# Patient Record
Sex: Female | Born: 1950 | Race: White | Hispanic: No | Smoking: Former smoker
Health system: Southern US, Community
[De-identification: ages and names within clinical notes are randomized; demographics above are authoritative.]

## PROBLEM LIST (undated history)

## (undated) DIAGNOSIS — D649 Anemia, unspecified: Secondary | ICD-10-CM

## (undated) DIAGNOSIS — R202 Paresthesia of skin: Secondary | ICD-10-CM

## (undated) DIAGNOSIS — R918 Other nonspecific abnormal finding of lung field: Secondary | ICD-10-CM

## (undated) DIAGNOSIS — N2889 Other specified disorders of kidney and ureter: Secondary | ICD-10-CM

## (undated) DIAGNOSIS — C8590 Non-Hodgkin lymphoma, unspecified, unspecified site: Secondary | ICD-10-CM

## (undated) DIAGNOSIS — R591 Generalized enlarged lymph nodes: Secondary | ICD-10-CM

## (undated) DIAGNOSIS — R222 Localized swelling, mass and lump, trunk: Secondary | ICD-10-CM

## (undated) DIAGNOSIS — M1990 Unspecified osteoarthritis, unspecified site: Secondary | ICD-10-CM

## (undated) DIAGNOSIS — C641 Malignant neoplasm of right kidney, except renal pelvis: Secondary | ICD-10-CM

## (undated) DIAGNOSIS — G839 Paralytic syndrome, unspecified: Secondary | ICD-10-CM

## (undated) DIAGNOSIS — N189 Chronic kidney disease, unspecified: Secondary | ICD-10-CM

## (undated) DIAGNOSIS — I10 Essential (primary) hypertension: Secondary | ICD-10-CM

## (undated) HISTORY — DX: Chronic kidney disease, unspecified: N18.9

## (undated) HISTORY — PX: BREAST LUMPECTOMY: SHX2

## (undated) HISTORY — PX: BREAST CYST EXCISION: SHX579

## (undated) HISTORY — PX: ABDOMINAL HYSTERECTOMY: SHX81

## (undated) HISTORY — PX: SHOULDER SURGERY: SHX246

---

## 2000-07-07 ENCOUNTER — Other Ambulatory Visit: Admission: RE | Admit: 2000-07-07 | Discharge: 2000-07-07 | Payer: Self-pay | Admitting: Family Medicine

## 2000-07-16 ENCOUNTER — Ambulatory Visit (HOSPITAL_COMMUNITY): Admission: RE | Admit: 2000-07-16 | Discharge: 2000-07-16 | Payer: Self-pay | Admitting: Family Medicine

## 2000-07-16 ENCOUNTER — Encounter: Payer: Self-pay | Admitting: Family Medicine

## 2000-08-15 ENCOUNTER — Encounter: Payer: Self-pay | Admitting: Family Medicine

## 2000-08-15 ENCOUNTER — Ambulatory Visit (HOSPITAL_COMMUNITY): Admission: RE | Admit: 2000-08-15 | Discharge: 2000-08-15 | Payer: Self-pay | Admitting: Family Medicine

## 2000-10-02 ENCOUNTER — Encounter (HOSPITAL_COMMUNITY): Admission: RE | Admit: 2000-10-02 | Discharge: 2000-11-01 | Payer: Self-pay | Admitting: Rheumatology

## 2000-11-20 ENCOUNTER — Encounter (HOSPITAL_COMMUNITY): Admission: RE | Admit: 2000-11-20 | Discharge: 2000-12-20 | Payer: Self-pay | Admitting: Rheumatology

## 2001-02-19 ENCOUNTER — Encounter (HOSPITAL_COMMUNITY): Admission: RE | Admit: 2001-02-19 | Discharge: 2001-03-21 | Payer: Self-pay | Admitting: Rheumatology

## 2001-04-02 ENCOUNTER — Encounter (HOSPITAL_COMMUNITY): Admission: RE | Admit: 2001-04-02 | Discharge: 2001-05-02 | Payer: Self-pay | Admitting: Rheumatology

## 2001-06-25 ENCOUNTER — Encounter (HOSPITAL_COMMUNITY): Admission: RE | Admit: 2001-06-25 | Discharge: 2001-07-25 | Payer: Self-pay | Admitting: Rheumatology

## 2001-11-17 ENCOUNTER — Encounter: Payer: Self-pay | Admitting: Family Medicine

## 2001-11-17 ENCOUNTER — Ambulatory Visit (HOSPITAL_COMMUNITY): Admission: RE | Admit: 2001-11-17 | Discharge: 2001-11-17 | Payer: Self-pay | Admitting: Family Medicine

## 2004-07-13 ENCOUNTER — Ambulatory Visit (HOSPITAL_COMMUNITY): Admission: RE | Admit: 2004-07-13 | Discharge: 2004-07-13 | Payer: Self-pay | Admitting: Family Medicine

## 2006-01-29 ENCOUNTER — Emergency Department (HOSPITAL_COMMUNITY): Admission: EM | Admit: 2006-01-29 | Discharge: 2006-01-29 | Payer: Self-pay | Admitting: Emergency Medicine

## 2006-03-14 ENCOUNTER — Ambulatory Visit (HOSPITAL_COMMUNITY): Admission: RE | Admit: 2006-03-14 | Discharge: 2006-03-14 | Payer: Self-pay | Admitting: Family Medicine

## 2008-03-18 ENCOUNTER — Ambulatory Visit (HOSPITAL_COMMUNITY): Admission: RE | Admit: 2008-03-18 | Discharge: 2008-03-18 | Payer: Self-pay | Admitting: Family Medicine

## 2008-03-24 ENCOUNTER — Ambulatory Visit (HOSPITAL_COMMUNITY): Admission: RE | Admit: 2008-03-24 | Discharge: 2008-03-24 | Payer: Self-pay | Admitting: Family Medicine

## 2009-05-03 ENCOUNTER — Ambulatory Visit (HOSPITAL_COMMUNITY): Admission: RE | Admit: 2009-05-03 | Discharge: 2009-05-03 | Payer: Self-pay | Admitting: Family Medicine

## 2010-01-09 ENCOUNTER — Ambulatory Visit (HOSPITAL_COMMUNITY): Admission: RE | Admit: 2010-01-09 | Discharge: 2010-01-09 | Payer: Self-pay | Admitting: Family Medicine

## 2010-07-06 NOTE — H&P (Signed)
[facility]  Patient:    Katrina [name], Katrina [name] Visit Number: [id number] MRN: [id number]          Service Type: RHE Location: [facility] Attending Physician:  Aundra Dubin Dictated by:   Nathaneil Canary, M.D. Admit Date:  02/19/2001   CC:         Butch Penny, M.D.                         History and Physical  CHIEF COMPLAINT:  Left elbow, osteoarthritis.  BRIEF HISTORY:  The patient reports that the left, more than the right, lateral elbows continue to ache particularly with activities where she is grasping items.  She has not used the Flexeril recently because there has been a change in the color of the pill.  She is generally sleeping better than she was several months ago.  She aches in her hands and does find that the Relafen helps with this.  There has been no rashes, nausea, GI distress.  Her weight is stable.  She has occasional headaches.  MEDICATIONS: 1. Relafen 1000 mg q. day. 2. Premarin q. day. 3. Vitamins. 4. Tylenol arthritis p.r.n. 5. Flexeril 10 mg h.s. p.r.n.  PHYSICAL EXAMINATION:  VITAL SIGNS:  Weight 241 pounds.  Blood pressure 148/100.  Respirations 14. LUNGS:  Clear.  LOWER EXTREMITIES:  No edema.  HEART:  Regular, no murmur.  NECK:  Negative JVD.  SKIN:  Clear.  MUSCULOSKELETAL:  There is minor degenerative changes to the DIPs.  The lateral epicondyles are tender more so on the left.  Trigger points around the shoulder, neck and occiput also are mildly tender.  PROCEDURE:  Left tennis elbow injection.  The skin was cleaned with alcohol and cooled with ethyl chloride.  Using a 25 gauge needle, 80 mg of Depo-Medrol and 1/2 cc. of 2% lidocaine was injected.  ASSESSMENT AND PLAN: 1. Lateral epicondylitis.  This has been injected as above.  She will rest the    elbow for 24 hours and then she can resume normal activities.  I have    encouraged her to continue using the tennis elbow straps quite regularly. 2. Osteoarthritis.   This is mild and she will continue with the Relafen which    helps with this. 3. Insomnia.  She will continue with p.r.n. use of the Flexeril.  FOLLOWUP:  She will return in 4 weeks and will also check labs at that time.Dictated by:   Nathaneil Canary, M.D. Attending Physician:  Aundra Dubin DD:  02/19/01 TD:  02/19/01 Job: [id number]

## 2010-07-06 NOTE — Consult Note (Signed)
[facility]  Patient:    CHARRON, COULTAS Visit Number: [id number] MRN: [id number]          Service Type: RHE Location: [facility] Attending Physician:  Aundra Dubin Dictated by:   Aundra Dubin, M.D. Proc. Date: 11/20/00 Admit Date:  11/20/2000   CC:         Butch Penny, M.D.   Consultation Report  CHIEF COMPLAINT:  Left elbow possible osteoarthritis.  HISTORY OF PRESENT ILLNESS:  Ms. Riebe returns reporting that she is overall improved but is still having difficulties with each of the areas that she discussed with me on the first visit.  Her left elbow is better but still aches, and she needs to use the tennis elbow strap to guard this area.  Her fingers are improved with the Relafen, but they still had some stiffness.  She is using the Flexeril from 5 mg to 10 at night, and her sleep is improved. She feels that some of the overall achiness is better.  There are no acutely swollen joints.  Her weight is up 2 pounds.  MEDICATIONS: 1. Premarin q.d. 2. Tylenol Arthritis p.r.n. 3. Relafen 1000 mg q.d. 4. Flexeril 5 to 10 mg h.s.  PHYSICAL EXAMINATION:  VITAL SIGNS:  Weight 242 pounds.  Blood pressure 150/100, respirations 16.  GENERAL:  In no distress.  SKIN:  Clear.  LUNGS:  Clear.  HEART:  Regular.  EXTREMITIES:  Lower extremities have trace edema.  MUSCULOSKELETAL:  She has slight nodularity to several DIPs, and they are nontender.  The MCPs and wrists are not swollen and are nontender.  The left lateral epicondyle is tender.  Also, the trigger point is tender.  Other trigger points around the shoulder, neck, and occiput are mildly tender.  The knees, ankles, and feet have a good range of motion and show no active swelling.  ASSESSMENT/PLAN: 1. Mild osteoarthritis.  She will continue with the Relafen and p.r.n.    use of Tylenol.  I am not seeing further signs to suggest an inflammatory    arthritis. 2. Left lateral  epicondylitis.  I have discussed with her that she can    certainly continue icing but particularly continue using the elbow strap.    If this is not satisfactorily improving, then I would consider an injection    but not at this time. 3. Insomnia.  This is improved and stable with the Flexeril, and she can use    this as she has been doing.  She will return in three months. Dictated by:   Aundra Dubin, M.D. Attending Physician:  Aundra Dubin DD:  11/20/00 TD:  11/20/00 Job: [id number]

## 2010-07-06 NOTE — Consult Note (Signed)
[facility]  Patient:    Katrina [name], Katrina [name] Visit Number: [id number] MRN: [id number]          Service Type: RHE Location: [facility] Attending Physician:  Aundra Dubin Dictated by:   Nathaneil Canary, M.D. Proc. Date: 06/25/01 Admit Date:  06/25/2001   CC:         Butch Penny, M.D.   Consultation Report  CHIEF COMPLAINT:  Left elbow insomnia, mild osteoarthritis.  BRIEF HISTORY:  The patient reports that her left tennis elbow is more active at this time.  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 She does use the braces to the elbows which helps some.  She feels the Xanax has helped her sleep.  She will alternate some between Xanax or Flexeril.  The Relafen has helped the achiness in her hands.  She has had some slight stomach upset over the last couple of weeks and I have suggested she stop the Relafen for a period of time to let this improve.  Her weight is up 3 pounds. There has been no cough, swollen joints, fever or rashes.  MEDICATIONS: 1. Flexeril 10 mg h.s. p.r.n. 2. Xanax 0.5 mg h.s. p.r.n. 3. Premarin 0.625 mg q. day. 4. Relafen 1000 mg q. day. 5. Multivitamin. 6. Tylenol arthritis p.r.n.  PHYSICAL EXAMINATION:  VITAL SIGNS:  Weight 240 pounds.  Blood pressure 140/80, respirations 16.  LUNGS:  Clear.  NECK:  Negative JVD.  EXTREMITIES:  Lower extremities, no edema.  HEART:  Regular.  No murmur.  MUSCULOSKELETAL:  The hands and wrists show minor fullness but are cool and nontender.  The left elbow extends fully which is nontender and she is very tender at the lateral epicondyle.  This area is cool.  Shoulders good range of motion.  Trigger areas at the elbow, shoulder, neck, occiput, anterior chest are all mildly tender with slight wincing. The lower back was nontender.  The knees, ankles, feet, had a good range of motion and showed no synovitis.  ASSESSMENT AND PLAN: 1. Left lateral epicondylitis.  I offered  to inject this but she would rather    not which is perfectly fine. She knows how to put the elbow and wrist    through a range of motion and to ice when she needs. The Relafen can help    this some.  If she can avoid doing those activities that aggravate it, that    would significantly help, but that may be impossible. 2. Insomnia.  The Xanax seems to have helped and she uses either Flexeril or    Xanax which is fine. 3. Mild osteoarthritis.  Will continue with Relafen p.r.n. 4. Mild gastritis.  She was encouraged to stop the Relafen as stated above.    She can use Tagamet or Zantac over the counter to try to help with this.    There are plenty of people that will cycle on and off of NSAID and she    knows to stop it when her stomach is being bothered.  FOLLOWUP:  She will return on a p.r.n. basis. Dictated by:   Nathaneil Canary, M.D. Attending Physician:  Aundra Dubin DD:  06/25/01 TD:  06/27/01 Job: [id number]

## 2010-07-06 NOTE — Consult Note (Signed)
[facility]  Patient:    Katrina [name] Visit Number: [id number] MRN: [id number]          Service Type: RHE Location: [facility] Attending Physician:  Aundra Dubin Dictated by:   Nathaneil Canary, M.D. Proc. Date: 04/02/01 Admit Date:  04/02/2001   CC:         Katrina [name], M.D.   Consultation Report  CHIEF COMPLAINT:  Left elbow, insomnia, osteoarthritis.  HISTORY:  Katrina [name] reports that the injection to the left tennis elbow helped considerably.  It had done quite well until she re-aggravated it mildly last week.  It still is not as bad as it was.  Her hands are still improved with the use of Relafen.  The Relafen also helped some with the elbow.  She does use the tennis elbow support for the left elbow.  Her weight is down 4 pounds. She has had no significant URIs, colds, or coughs.  She is using the Flexeril at this time, but finds that she has some floating feelings as she is going to sleep.  She does not feel rested on awakening.  MEDICATIONS: 1. Flexeril 10 mg h.s. 2. Premarin 0.625 mg q.d. 3. Vitamins. 4. Tylenol Arthritis p.r.n. 5. Relafen 1000 mg q.d.  PHYSICAL EXAMINATION  VITAL SIGNS:  Weight 237 pounds, blood pressure 150/98, respirations 16.  GENERAL:  No distress.  LUNGS:  Clear.  NECK:  Negative JVD.  HEART:  Regular.  No murmur.  EXTREMITIES:  Lower extremities:  No edema.  MUSCULOSKELETAL:  The left elbow has good range of motion and is tender on the lateral epicondyle.  Also, the trigger points at both elbows are tender. Trigger points at the shoulder, occiput, anterior chest, upper paraspinous muscles are tender.  The hands and wrists had no significant swelling or tenderness.  Knees, ankles, feet had a good range of motion.  ASSESSMENT AND PLAN: 1. Left tennis elbow.  This is improved at this point.  However, she is    continuing to have some chronic problems.  She is advised to continue using    the brace  regularly.  She can also ice the elbow and put it through a range    of motion when needed.  She will continue on the Relafen. 2. Osteoarthritis.  This is mild and Relafen is helping. 3. Insomnia.  I will switch her to Xanax 0.5 mg.  She will start with a half    pill and might increase to as much as 1 mg h.s.  In about two weeks she    could try a combination of Flexeril and Xanax in low doses.  She will watch    for any excessive grogginess in the mornings.  She will return in three months.  I did not have her go for laboratories at this visit and we will try and get this done as she returns. Dictated by:   Nathaneil Canary, M.D. Attending Physician:  Aundra Dubin DD:  04/02/01 TD:  04/02/01 Job: [id number]

## 2010-09-03 ENCOUNTER — Other Ambulatory Visit (HOSPITAL_COMMUNITY): Payer: Self-pay | Admitting: Family Medicine

## 2010-09-03 DIAGNOSIS — Z139 Encounter for screening, unspecified: Secondary | ICD-10-CM

## 2010-09-07 ENCOUNTER — Ambulatory Visit (HOSPITAL_COMMUNITY)
Admission: RE | Admit: 2010-09-07 | Discharge: 2010-09-07 | Disposition: A | Discharge status: Home / Self Care | Payer: 59 | Source: Ambulatory Visit | Attending: Family Medicine | Admitting: Family Medicine

## 2010-09-07 DIAGNOSIS — Z1231 Encounter for screening mammogram for malignant neoplasm of breast: Secondary | ICD-10-CM | POA: Insufficient documentation

## 2010-09-07 DIAGNOSIS — Z139 Encounter for screening, unspecified: Secondary | ICD-10-CM

## 2011-12-26 ENCOUNTER — Other Ambulatory Visit (HOSPITAL_COMMUNITY): Payer: Self-pay | Admitting: Family Medicine

## 2011-12-26 DIAGNOSIS — Z139 Encounter for screening, unspecified: Secondary | ICD-10-CM

## 2012-01-03 ENCOUNTER — Ambulatory Visit (HOSPITAL_COMMUNITY)
Admission: RE | Admit: 2012-01-03 | Discharge: 2012-01-03 | Disposition: A | Discharge status: Home / Self Care | Payer: 59 | Source: Ambulatory Visit | Attending: Family Medicine | Admitting: Family Medicine

## 2012-01-03 DIAGNOSIS — Z1231 Encounter for screening mammogram for malignant neoplasm of breast: Secondary | ICD-10-CM | POA: Insufficient documentation

## 2012-01-03 DIAGNOSIS — Z139 Encounter for screening, unspecified: Secondary | ICD-10-CM

## 2013-05-27 ENCOUNTER — Other Ambulatory Visit: Payer: Self-pay | Admitting: Internal Medicine

## 2013-10-22 ENCOUNTER — Other Ambulatory Visit: Payer: Self-pay | Admitting: Gastroenterology

## 2013-10-22 ENCOUNTER — Encounter (INDEPENDENT_AMBULATORY_CARE_PROVIDER_SITE_OTHER): Payer: Self-pay

## 2013-10-22 ENCOUNTER — Encounter: Payer: Self-pay | Admitting: Gastroenterology

## 2013-10-22 ENCOUNTER — Ambulatory Visit (INDEPENDENT_AMBULATORY_CARE_PROVIDER_SITE_OTHER): Payer: 59 | Admitting: Gastroenterology

## 2013-10-22 VITALS — BP 167/86 | HR 79 | Temp 98.1°F | Ht 69.0 in | Wt 246.2 lb

## 2013-10-22 DIAGNOSIS — R1011 Right upper quadrant pain: Secondary | ICD-10-CM

## 2013-10-22 DIAGNOSIS — Z800 Family history of malignant neoplasm of digestive organs: Secondary | ICD-10-CM

## 2013-10-22 LAB — CBC
HCT: 38.5 % (ref 36.0–46.0)
HEMOGLOBIN: 13.3 g/dL (ref 12.0–15.0)
MCH: 27.8 pg (ref 26.0–34.0)
MCHC: 34.5 g/dL (ref 30.0–36.0)
MCV: 80.5 fL (ref 78.0–100.0)
PLATELETS: 282 10*3/uL (ref 150–400)
RBC: 4.78 MIL/uL (ref 3.87–5.11)
RDW: 14.6 % (ref 11.5–15.5)
WBC: 7.7 10*3/uL (ref 4.0–10.5)

## 2013-10-22 MED ORDER — PEG 3350-KCL-NA BICARB-NACL 420 G PO SOLR: 4000.0000 mL | ORAL | Status: DC

## 2013-10-22 NOTE — Progress Notes (Signed)
Cc to pcp °

## 2013-10-22 NOTE — Assessment & Plan Note (Signed)
Sister diagnosed in early 48s, brother early 63s. No prior colonoscopy. Needs high risk screening colonoscopy. No concerning lower GI symptoms.   Proceed with colonoscopy with Dr. Oneida Alar in the near future. The risks, benefits, and alternatives have been discussed in detail with the patient. They state understanding and desire to proceed.  Add supplemental fiber daily

## 2013-10-22 NOTE — Patient Instructions (Signed)
I have ordered blood work and an ultrasound of your belly.   We have also scheduled you for a colonoscopy with Dr. Oneida Alar.   Take supplemental fiber such as Metamucil or Benefiber daily.   Further recommendations to follow!

## 2013-10-22 NOTE — Assessment & Plan Note (Signed)
Vague. No red flags. Gallbladder remains in situ. No dysphagia, loss of appetite. Will check CBC, CMP first and add [place] of abdomen. Hold on EGD unless further signs such as early satiety, dysphagia, uncontrolled GERD, weight loss, etc.

## 2013-10-22 NOTE — Progress Notes (Signed)
Primary Care Physician:  Lanette Hampshire, MD Primary Gastroenterologist:  Dr. Oneida Alar  Chief Complaint  Patient presents with  . Colonoscopy    HPI:   Katrina [name] presents today as a self-referral for a high risk screening colonoscopy. Both her sister and brother have been diagnosed with colon cancer. Occasional lower abdominal vague pain. Some nagging discomfort in RUQ. Had salad and tuna last night and felt kind achy in RUQ. If lays on it, quits hurting. Gallbladder remains in situ. Not abdominal pain, more of an irritation. Occasional queasiness but no vomiting. Has had some spells with bad indigestion. Has taken an OTC acid reducer but now resolved. Changed diet with resolution of reflux symptoms. No weight loss, lack of appetite, or dysphagia.   No hematochezia. States intermittent constipation/occasional diarrhea chronically. Helps if she eats fiber. Not taking fiber consistently.   Past Medical History  Diagnosis Date  . Medical history non-contributory     Past Surgical History  Procedure Laterality Date  . Abdominal hysterectomy    . Shoulder surgery      right  . Breast lumpectomy      left    Current Outpatient Prescriptions  Medication Sig Dispense Refill  . Cholecalciferol (VITAMIN D3) 2000 UNITS TABS Take 2,000 Units by mouth daily.      . Multiple Vitamin (MULTIVITAMIN) capsule Take 1 capsule by mouth daily.       No current facility-administered medications for this visit.    Allergies as of 10/22/2013  . (No Known Allergies)    Family History  Problem Relation Age of Onset  . Colon cancer Sister     at age 78  . Colon cancer Brother     at age 24    History   Social History  . Marital Status: Legally Separated    Spouse Name: N/A    Number of Children: N/A  . Years of Education: N/A   Occupational History  . Not on file.   Social History Main Topics  . Smoking status: Never Smoker   . Smokeless tobacco: Not on file  . Alcohol  Use: No  . Drug Use: No  . Sexual Activity: Not on file   Other Topics Concern  . Not on file   Social History Narrative  . No narrative on file    Review of Systems: Gen: Denies any fever, chills, fatigue, weight loss, lack of appetite.  CV: Denies chest pain, heart palpitations, peripheral edema, syncope.  Resp: intermittent cough lately GI: see HPI GU : Denies urinary burning, urinary frequency, urinary hesitancy MS: Denies joint pain, muscle weakness, cramps, or limitation of movement.  Derm: Denies rash, itching, dry skin Psych: +depression/anxiety, situational Heme: Denies bruising, bleeding, and enlarged lymph nodes.  Physical Exam: BP 167/86  Pulse 79  Temp(Src) 98.1 F (36.7 C) (Oral)  Ht 5\' 9"  (1.753 m)  Wt 246 lb 3.2 oz (111.676 kg)  BMI 36.34 kg/m2 General:   Alert and oriented. Pleasant and cooperative. Well-nourished and well-developed.  Head:  Normocephalic and atraumatic. Eyes:  Without icterus, sclera clear and conjunctiva pink.  Ears:  Normal auditory acuity. Nose:  No deformity, discharge,  or lesions. Mouth:  No deformity or lesions, oral mucosa pink.  Lungs:  Clear to auscultation bilaterally. No wheezes, rales, or rhonchi. No distress.  Heart:  S1, S2 present without murmurs appreciated.  Abdomen:  +BS, soft, non-tender and non-distended. No HSM noted. No guarding or rebound. No masses appreciated.  Rectal:  Deferred  Msk:  Symmetrical without gross deformities. Normal posture. Extremities:  Without clubbing or edema. Neurologic:  Alert and  oriented x4;  grossly normal neurologically. Skin:  Intact without significant lesions or rashes. Psych:  Alert and cooperative. Normal mood and affect.

## 2013-10-23 LAB — COMPREHENSIVE METABOLIC PANEL
ALT: 23 U/L (ref 0–35)
AST: 23 U/L (ref 0–37)
Albumin: 4.7 g/dL (ref 3.5–5.2)
Alkaline Phosphatase: 59 U/L (ref 39–117)
BILIRUBIN TOTAL: 0.5 mg/dL (ref 0.2–1.2)
BUN: 18 mg/dL (ref 6–23)
CALCIUM: 9.9 mg/dL (ref 8.4–10.5)
CO2: 25 meq/L (ref 19–32)
Chloride: 101 mEq/L (ref 96–112)
Creat: 0.79 mg/dL (ref 0.50–1.10)
GLUCOSE: 101 mg/dL — AB (ref 70–99)
Potassium: 4 mEq/L (ref 3.5–5.3)
Sodium: 138 mEq/L (ref 135–145)
TOTAL PROTEIN: 7.6 g/dL (ref 6.0–8.3)

## 2013-10-27 NOTE — Progress Notes (Signed)
Quick Note:  CBC, CMP normal. [place] of abdomen upcoming. What's the status of scheduling the colonoscopy? ______

## 2013-10-29 ENCOUNTER — Other Ambulatory Visit: Payer: Self-pay | Admitting: Gastroenterology

## 2013-10-29 ENCOUNTER — Encounter (HOSPITAL_COMMUNITY): Payer: Self-pay | Admitting: Pharmacy Technician

## 2013-10-29 DIAGNOSIS — Z800 Family history of malignant neoplasm of digestive organs: Secondary | ICD-10-CM

## 2013-10-29 DIAGNOSIS — R1011 Right upper quadrant pain: Secondary | ICD-10-CM

## 2013-10-29 NOTE — Progress Notes (Signed)
Patient is scheduled for a TCS w/SLF on 9/15

## 2013-11-01 ENCOUNTER — Ambulatory Visit (HOSPITAL_COMMUNITY)
Admission: RE | Admit: 2013-11-01 | Discharge: 2013-11-01 | Disposition: A | Discharge status: Home / Self Care | Payer: 59 | Source: Ambulatory Visit | Attending: Gastroenterology | Admitting: Gastroenterology

## 2013-11-01 ENCOUNTER — Telehealth: Payer: Self-pay | Admitting: Gastroenterology

## 2013-11-01 DIAGNOSIS — K7689 Other specified diseases of liver: Secondary | ICD-10-CM | POA: Diagnosis not present

## 2013-11-01 DIAGNOSIS — R1011 Right upper quadrant pain: Secondary | ICD-10-CM | POA: Diagnosis present

## 2013-11-01 NOTE — Telephone Encounter (Signed)
I have spoke to pt. See result notes.

## 2013-11-01 NOTE — Progress Notes (Signed)
Quick Note:  PT is aware of results and she had the [place] this morning and TCS is scheduled for tomorrow. ______

## 2013-11-01 NOTE — Progress Notes (Signed)
Quick Note:  LMOM for pt to call. ( TCS is scheduled for 11/02/2013). ______

## 2013-11-01 NOTE — Telephone Encounter (Signed)
PLEASE CALL BACK AT [phone]   RETURNING CALL FROM DORIS

## 2013-11-02 ENCOUNTER — Ambulatory Visit (HOSPITAL_COMMUNITY)
Admission: RE | Admit: 2013-11-02 | Discharge: 2013-11-02 | Disposition: A | Discharge status: Home / Self Care | Payer: 59 | Source: Ambulatory Visit | Attending: Gastroenterology | Admitting: Gastroenterology

## 2013-11-02 ENCOUNTER — Encounter (HOSPITAL_COMMUNITY)
Admission: RE | Disposition: A | Discharge status: Home / Self Care | Payer: Self-pay | Source: Ambulatory Visit | Attending: Gastroenterology

## 2013-11-02 ENCOUNTER — Encounter (HOSPITAL_COMMUNITY): Payer: Self-pay

## 2013-11-02 DIAGNOSIS — D128 Benign neoplasm of rectum: Secondary | ICD-10-CM | POA: Insufficient documentation

## 2013-11-02 DIAGNOSIS — Z1211 Encounter for screening for malignant neoplasm of colon: Secondary | ICD-10-CM | POA: Diagnosis present

## 2013-11-02 DIAGNOSIS — Z90710 Acquired absence of both cervix and uterus: Secondary | ICD-10-CM | POA: Diagnosis not present

## 2013-11-02 DIAGNOSIS — D129 Benign neoplasm of anus and anal canal: Secondary | ICD-10-CM | POA: Diagnosis not present

## 2013-11-02 DIAGNOSIS — K5730 Diverticulosis of large intestine without perforation or abscess without bleeding: Secondary | ICD-10-CM | POA: Insufficient documentation

## 2013-11-02 DIAGNOSIS — K648 Other hemorrhoids: Secondary | ICD-10-CM | POA: Diagnosis not present

## 2013-11-02 DIAGNOSIS — Z800 Family history of malignant neoplasm of digestive organs: Secondary | ICD-10-CM

## 2013-11-02 DIAGNOSIS — D126 Benign neoplasm of colon, unspecified: Secondary | ICD-10-CM | POA: Diagnosis not present

## 2013-11-02 DIAGNOSIS — R1011 Right upper quadrant pain: Secondary | ICD-10-CM

## 2013-11-02 HISTORY — PX: COLONOSCOPY: SHX5424

## 2013-11-02 SURGERY — COLONOSCOPY
Anesthesia: Moderate Sedation

## 2013-11-02 MED ORDER — MEPERIDINE HCL 100 MG/ML IJ SOLN
INTRAMUSCULAR | Status: DC | PRN
  Administered 2013-11-02 (×3): 25 mg via INTRAVENOUS

## 2013-11-02 MED ORDER — MIDAZOLAM HCL 5 MG/5ML IJ SOLN
INTRAMUSCULAR | Status: AC
  Filled 2013-11-02: qty 10

## 2013-11-02 MED ORDER — MEPERIDINE HCL 100 MG/ML IJ SOLN
INTRAMUSCULAR | Status: AC
  Filled 2013-11-02: qty 2

## 2013-11-02 MED ORDER — MIDAZOLAM HCL 5 MG/5ML IJ SOLN
INTRAMUSCULAR | Status: DC | PRN
  Administered 2013-11-02 (×3): 2 mg via INTRAVENOUS

## 2013-11-02 MED ORDER — SODIUM CHLORIDE 0.9 % IV SOLN
INTRAVENOUS | Status: DC
  Administered 2013-11-02: 08:00:00 via INTRAVENOUS

## 2013-11-02 MED ORDER — STERILE WATER FOR IRRIGATION IR SOLN
Status: DC | PRN
  Administered 2013-11-02: 09:00:00

## 2013-11-02 NOTE — Op Note (Signed)
[facility] [place], [id number]   COLONOSCOPY PROCEDURE REPORT  PATIENT: Katrina, [name]  MR#: [id number] BIRTHDATE: [birth year] , 62  yrs. old GENDER: Female ENDOSCOPIST: Barney Drain, MD REFERRED OF:[facility] Everette Rank, M.D. PROCEDURE DATE:  11/02/2013 PROCEDURE:   Colonoscopy with cold biopsy polypectomy and with snare polypectomy/HEMOCLIP PLACEMENT INDICATIONS:Patient's immediate family history of colon cancer: sister age < 58, brother age > 84). MEDICATIONS: Demerol 75 mg IV and Versed 6 mg IV DESCRIPTION OF PROCEDURE:    Physical exam was performed.  Informed consent was obtained from the patient after explaining the benefits, risks, and alternatives to procedure.  The patient was connected to monitor and placed in left lateral position. Continuous oxygen was provided by nasal cannula and IV medicine administered through an indwelling cannula.  After administration of sedation and rectal exam, the patients rectum was intubated and the EC-[id number] ([id number])  colonoscope was advanced under direct visualization to the ileum.  The scope was removed slowly by carefully examining the color, texture, anatomy, and integrity mucosa on the way out.  The patient was recovered in endoscopy and discharged home in satisfactory condition.    COLON FINDINGS: The mucosa appeared normal in the terminal ileum.  , A sessile polyp measuring 2 mm in size was found at the cecum.  A polypectomy was performed with cold forceps.  , Three sessile polyps measuring 6-7 mm in size were found at the hepatic flexure(1), in the descending colon(1), and rectum(1). Polypectomy was performed using snare cautery. A pedunculated polyp measuring 1.2 cm in size was found in the rectum.  Polypectomy was performed using snare cautery.  HEMOCLIP PLACED TO PREVENT POST-POLYPECTOMY BLEED. Moderate diverticulosis noted in the descending colon and sigmoid colon with associated muscular  hypertrophy. The LEFT colon IS redundant.  Manual abdominal counter-pressure was used to reach the cecum.  The patient was moved on to their back to reach the cecum, and Small internal hemorrhoids were found.  PREP QUALITY: good. CECAL W/D TIME: 24 minutes     COMPLICATIONS: None  ENDOSCOPIC IMPRESSION: 1.   FIVE COLON POLYPS REMOVED. 2.   Moderate diverticulosis in the descending colon and sigmoid colon 3.   Small internal hemorrhoids  RECOMMENDATIONS: NO MRI FOR 30 DAYS. FOLLOW A HIGH FIBER DIET.  AVOID ITEMS THAT CAUSE BLOATING. BIOPSY RESULTS SHOULD BE BACK IN 14 DAYS. Next colonoscopy in 1-3 years WITH AN OVERTUBE.  _______________________________ Lorrin MaisBarney Drain, MD 11/02/2013 9:56 AM

## 2013-11-02 NOTE — Discharge Instructions (Signed)
You had 5 polyps removed. You have internal hemorrhoids and diverticulosis IN YOUR LEFT COLON. I PLACED A CLIP TO PREVENT BLEEDING IN 7-10 DAYS.    NO MRI FOR 30 DAYS.  FOLLOW A HIGH FIBER DIET. AVOID ITEMS THAT CAUSE BLOATING. SEE INFO BELOW.  YOUR BIOPSY RESULTS SHOULD BE BACK IN 14 DAYS.  Next colonoscopy in 1-3 years.   Colonoscopy Care After Read the instructions outlined below and refer to this sheet in the next week. These discharge instructions provide you with general information on caring for yourself after you leave the hospital. While your treatment has been planned according to the most current medical practices available, unavoidable complications occasionally occur. If you have any problems or questions after discharge, call DR. Shadai Mcclane, [phone].  ACTIVITY  You may resume your regular activity, but move at a slower pace for the next 24 hours.   Take frequent rest periods for the next 24 hours.   Walking will help get rid of the air and reduce the bloated feeling in your belly (abdomen).   No driving for 24 hours (because of the medicine (anesthesia) used during the test).   You may shower.   Do not sign any important legal documents or operate any machinery for 24 hours (because of the anesthesia used during the test).    NUTRITION  Drink plenty of fluids.   You may resume your normal diet as instructed by your doctor.   Begin with a light meal and progress to your normal diet. Heavy or fried foods are harder to digest and may make you feel sick to your stomach (nauseated).   Avoid alcoholic beverages for 24 hours or as instructed.    MEDICATIONS  You may resume your normal medications.   WHAT YOU CAN EXPECT TODAY  Some feelings of bloating in the abdomen.   Passage of more gas than usual.   Spotting of blood in your stool or on the toilet paper  .  IF YOU HAD POLYPS REMOVED DURING THE COLONOSCOPY:  Eat a soft diet IF YOU HAVE NAUSEA,  BLOATING, ABDOMINAL PAIN, OR VOMITING.    FINDING OUT THE RESULTS OF YOUR TEST Not all test results are available during your visit. DR. Oneida Alar WILL CALL YOU WITHIN 7 DAYS OF YOUR PROCEDUE WITH YOUR RESULTS. Do not assume everything is normal if you have not heard from DR. Baley Lorimer IN ONE WEEK, CALL HER OFFICE AT [phone].  SEEK IMMEDIATE MEDICAL ATTENTION AND CALL THE OFFICE: [phone] IF:  You have more than a spotting of blood in your stool.   Your belly is swollen (abdominal distention).   You are nauseated or vomiting.   You have a temperature over 101F.   You have abdominal pain or discomfort that is severe or gets worse throughout the day.  Polyps, Colon  A polyp is extra tissue that grows inside your body. Colon polyps grow in the large intestine. The large intestine, also called the colon, is part of your digestive system. It is a long, hollow tube at the end of your digestive tract where your body makes and stores stool. Most polyps are not dangerous. They are benign. This means they are not cancerous. But over time, some types of polyps can turn into cancer. Polyps that are smaller than a pea are usually not harmful. But larger polyps could someday become or may already be cancerous. To be safe, doctors remove all polyps and test them.   WHO GETS POLYPS? Anyone can get polyps,  but certain people are more likely than others. You may have a greater chance of getting polyps if:  You are over 50.   You have had polyps before.   Someone in your family has had polyps.   Someone in your family has had cancer of the large intestine.   Find out if someone in your family has had polyps. You may also be more likely to get polyps if you:   Eat a lot of fatty foods   Smoke   Drink alcohol   Do not exercise  Eat too much   TREATMENT  The caregiver will remove the polyp during sigmoidoscopy or colonoscopy.  PREVENTION There is not one sure way to prevent polyps. You  might be able to lower your risk of getting them if you:  Eat more fruits and vegetables and less fatty food.   Do not smoke.   Avoid alcohol.   Exercise every day.   Lose weight if you are overweight.   Eating more calcium and folate can also lower your risk of getting polyps. Some foods that are rich in calcium are milk, cheese, and broccoli. Some foods that are rich in folate are chickpeas, kidney beans, and spinach.   High-Fiber Diet A high-fiber diet changes your normal diet to include more whole grains, legumes, fruits, and vegetables. Changes in the diet involve replacing refined carbohydrates with unrefined foods. The calorie level of the diet is essentially unchanged. The Dietary Reference Intake (recommended amount) for adult males is 38 grams per day. For adult females, it is 25 grams per day. Pregnant and lactating women should consume 28 grams of fiber per day. Fiber is the intact part of a plant that is not broken down during digestion. Functional fiber is fiber that has been isolated from the plant to provide a beneficial effect in the body. PURPOSE  Increase stool bulk.   Ease and regulate bowel movements.   Lower cholesterol.  INDICATIONS THAT YOU NEED MORE FIBER  Constipation and hemorrhoids.   Uncomplicated diverticulosis (intestine condition) and irritable bowel syndrome.   Weight management.   As a protective measure against hardening of the arteries (atherosclerosis), diabetes, and cancer.   GUIDELINES FOR INCREASING FIBER IN THE DIET  Start adding fiber to the diet slowly. A gradual increase of about 5 more grams (2 slices of whole-wheat bread, 2 servings of most fruits or vegetables, or 1 bowl of high-fiber cereal) per day is best. Too rapid an increase in fiber may result in constipation, flatulence, and bloating.   Drink enough water and fluids to keep your urine clear or pale yellow. Water, juice, or caffeine-free drinks are recommended. Not drinking  enough fluid may cause constipation.   Eat a variety of high-fiber foods rather than one type of fiber.   Try to increase your intake of fiber through using high-fiber foods rather than fiber pills or supplements that contain small amounts of fiber.   The goal is to change the types of food eaten. Do not supplement your present diet with high-fiber foods, but replace foods in your present diet.  INCLUDE A VARIETY OF FIBER SOURCES  Replace refined and processed grains with whole grains, canned fruits with fresh fruits, and incorporate other fiber sources. White rice, white breads, and most bakery goods contain little or no fiber.   Brown whole-grain rice, buckwheat oats, and many fruits and vegetables are all good sources of fiber. These include: broccoli, Brussels sprouts, cabbage, cauliflower, beets, sweet potatoes, white  potatoes (skin on), carrots, tomatoes, eggplant, squash, berries, fresh fruits, and dried fruits.   Cereals appear to be the richest source of fiber. Cereal fiber is found in whole grains and bran. Bran is the fiber-rich outer coat of cereal grain, which is largely removed in refining. In whole-grain cereals, the bran remains. In breakfast cereals, the largest amount of fiber is found in those with "bran" in their names. The fiber content is sometimes indicated on the label.   You may need to include additional fruits and vegetables each day.   In baking, for 1 cup white flour, you may use the following substitutions:   1 cup whole-wheat flour minus 2 tablespoons.   1/2 cup white flour plus 1/2 cup whole-wheat flour.   Diverticulosis Diverticulosis is a common condition that develops when small pouches (diverticula) form in the wall of the colon. The risk of diverticulosis increases with age. It happens more often in people who eat a low-fiber diet. Most individuals with diverticulosis have no symptoms. Those individuals with symptoms usually experience belly (abdominal)  pain, constipation, or loose stools (diarrhea).  HOME CARE INSTRUCTIONS  Increase the amount of fiber in your diet as directed by your caregiver or dietician. This may reduce symptoms of diverticulosis.   Drink at least 6 to 8 glasses of water each day to prevent constipation.   Try not to strain when you have a bowel movement.   Avoiding nuts and seeds to prevent complications is still an uncertain benefit.       FOODS HAVING HIGH FIBER CONTENT INCLUDE:  Fruits. Apple, peach, pear, tangerine, raisins, prunes.   Vegetables. Brussels sprouts, asparagus, broccoli, cabbage, carrot, cauliflower, romaine lettuce, spinach, summer squash, tomato, winter squash, zucchini.   Starchy Vegetables. Baked beans, kidney beans, lima beans, split peas, lentils, potatoes (with skin).   Grains. Whole wheat bread, brown rice, bran flake cereal, plain oatmeal, white rice, shredded wheat, bran muffins.    SEEK IMMEDIATE MEDICAL CARE IF:  You develop increasing pain or severe bloating.   You have an oral temperature above 101F.   You develop vomiting or bowel movements that are bloody or black.   Hemorrhoids Hemorrhoids are dilated (enlarged) veins around the rectum. Sometimes clots will form in the veins. This makes them swollen and painful. These are called thrombosed hemorrhoids. Causes of hemorrhoids include:  Constipation.   Straining to have a bowel movement.   HEAVY LIFTING HOME CARE INSTRUCTIONS  Eat a well balanced diet and drink 6 to 8 glasses of water every day to avoid constipation. You may also use a bulk laxative.   Avoid straining to have bowel movements.   Keep anal area dry and clean.   Do not use a donut shaped pillow or sit on the toilet for long periods. This increases blood pooling and pain.   Move your bowels when your body has the urge; this will require less straining and will decrease pain and pressure.

## 2013-11-02 NOTE — H&P (Signed)
  Primary Care Physician:  Lanette Hampshire, MD Primary Gastroenterologist:  Dr. Oneida Alar  Pre-Procedure History & Physical: HPI:  Katrina [name] is a 63 y.o. female here for [place].  Past Medical History  Diagnosis Date  . Medical history non-contributory     Past Surgical History  Procedure Laterality Date  . Abdominal hysterectomy    . Shoulder surgery      right  . Breast lumpectomy      left    Prior to Admission medications   Medication Sig Start Date End Date Taking? Authorizing Provider  Cholecalciferol (VITAMIN D3) 2000 UNITS TABS Take 2,000 Units by mouth daily.   Yes Historical Provider, MD  Multiple Vitamin (MULTIVITAMIN) capsule Take 1 capsule by mouth daily.   Yes Historical Provider, MD    Allergies as of 10/29/2013  . (No Known Allergies)    Family History  Problem Relation Age of Onset  . Colon cancer Sister     at age 40  . Colon cancer Brother     at age 60    History   Social History  . Marital Status: Widowed    Spouse Name: N/A    Number of Children: N/A  . Years of Education: N/A   Occupational History  . Not on file.   Social History Main Topics  . Smoking status: Never Smoker   . Smokeless tobacco: Not on file  . Alcohol Use: No  . Drug Use: No  . Sexual Activity: Not on file   Other Topics Concern  . Not on file   Social History Narrative  . No narrative on file    Review of Systems: See HPI, otherwise negative ROS   Physical Exam: BP 146/77  Pulse 64  Temp(Src) 98.6 F (37 C) (Oral)  Resp 14  SpO2 98% General:   Alert,  pleasant and cooperative in NAD Head:  Normocephalic and atraumatic. Neck:  Supple; Lungs:  Clear throughout to auscultation.    Heart:  Regular rate and rhythm. Abdomen:  Soft, nontender and nondistended. Normal bowel sounds, without guarding, and without rebound.   Neurologic:  Alert and  oriented x4;  grossly normal neurologically.  Impression/Plan:      SCREENING  Plan:  1. TCS TODAY

## 2013-11-02 NOTE — Progress Notes (Signed)
REVIEWED-NO ADDITIONAL RECOMMENDATIONS. 

## 2013-11-04 ENCOUNTER — Encounter (HOSPITAL_COMMUNITY): Payer: Self-pay | Admitting: Gastroenterology

## 2013-11-10 NOTE — Progress Notes (Signed)
Quick Note:  Fatty liver. If persistent RUQ pain, needs HIDA. Instructions for fatty liver: Recommend 1-2# weight loss per week until ideal body weight through exercise & diet. Low fat/cholesterol diet.  Avoid sweets, sodas, fruit juices, sweetened beverages like tea, etc. Gradually increase exercise from 15 min daily up to 1 hr per day 5 days/week. Limit alcohol use.   ______

## 2013-11-10 NOTE — Progress Notes (Signed)
Quick Note:  Pt is aware. ______ 

## 2013-11-25 ENCOUNTER — Telehealth: Payer: Self-pay | Admitting: Gastroenterology

## 2013-11-25 NOTE — Telephone Encounter (Signed)
PATIENT CALLED INQUIRING ABOUT COLONOSCOPY RESULTS   PLEASE CALL

## 2013-12-01 NOTE — Telephone Encounter (Signed)
I called pt and told her that Dr. Oneida Alar is in the office today and I will let her know that she has called for results.

## 2013-12-06 NOTE — Telephone Encounter (Addendum)
Please call pt.  I AM SORRY FOR THE DELAY IN GETTING BACK TO HER. She had FOUR simple adenomas removed from her colon. ONE WAS > 1 CM BUT HAD NO ADVANCED CHANGES.FOLLOW A HIGH FIBER DIET. TCS IN 3 YEARS.

## 2013-12-06 NOTE — Telephone Encounter (Signed)
Pt is aware.  

## 2013-12-06 NOTE — Telephone Encounter (Signed)
Pt called again for results from 11/02/2013. Please advise!

## 2014-04-29 ENCOUNTER — Other Ambulatory Visit (HOSPITAL_COMMUNITY): Payer: Self-pay | Admitting: Nurse Practitioner

## 2014-04-29 DIAGNOSIS — Z1231 Encounter for screening mammogram for malignant neoplasm of breast: Secondary | ICD-10-CM

## 2014-05-04 ENCOUNTER — Ambulatory Visit (HOSPITAL_COMMUNITY)
Admission: RE | Admit: 2014-05-04 | Discharge: 2014-05-04 | Disposition: A | Discharge status: Home / Self Care | Payer: 59 | Source: Ambulatory Visit | Attending: Nurse Practitioner | Admitting: Nurse Practitioner

## 2014-05-04 ENCOUNTER — Ambulatory Visit (HOSPITAL_COMMUNITY): Payer: 59

## 2014-05-04 DIAGNOSIS — Z1231 Encounter for screening mammogram for malignant neoplasm of breast: Secondary | ICD-10-CM

## 2016-07-23 ENCOUNTER — Other Ambulatory Visit (HOSPITAL_COMMUNITY): Payer: Self-pay | Admitting: *Deleted

## 2016-07-23 DIAGNOSIS — Z1231 Encounter for screening mammogram for malignant neoplasm of breast: Secondary | ICD-10-CM

## 2016-07-31 ENCOUNTER — Ambulatory Visit (HOSPITAL_COMMUNITY)
Admission: RE | Admit: 2016-07-31 | Discharge: 2016-07-31 | Disposition: A | Discharge status: Home / Self Care | Payer: PRIVATE HEALTH INSURANCE | Source: Ambulatory Visit | Attending: *Deleted | Admitting: *Deleted

## 2016-07-31 DIAGNOSIS — Z1231 Encounter for screening mammogram for malignant neoplasm of breast: Secondary | ICD-10-CM | POA: Diagnosis present

## 2016-10-29 ENCOUNTER — Encounter: Payer: Self-pay | Admitting: Gastroenterology

## 2016-11-18 DIAGNOSIS — R222 Localized swelling, mass and lump, trunk: Secondary | ICD-10-CM

## 2016-11-18 DIAGNOSIS — N2889 Other specified disorders of kidney and ureter: Secondary | ICD-10-CM

## 2016-11-18 DIAGNOSIS — R918 Other nonspecific abnormal finding of lung field: Secondary | ICD-10-CM

## 2016-11-18 DIAGNOSIS — R202 Paresthesia of skin: Secondary | ICD-10-CM

## 2016-11-18 DIAGNOSIS — R591 Generalized enlarged lymph nodes: Secondary | ICD-10-CM

## 2016-11-18 HISTORY — DX: Generalized enlarged lymph nodes: R59.1

## 2016-11-18 HISTORY — DX: Other nonspecific abnormal finding of lung field: R91.8

## 2016-11-18 HISTORY — DX: Localized swelling, mass and lump, trunk: R22.2

## 2016-11-18 HISTORY — DX: Paresthesia of skin: R20.2

## 2016-11-18 HISTORY — DX: Other specified disorders of kidney and ureter: N28.89

## 2016-12-12 ENCOUNTER — Emergency Department (HOSPITAL_COMMUNITY): Payer: Self-pay

## 2016-12-12 ENCOUNTER — Inpatient Hospital Stay (HOSPITAL_COMMUNITY)
Admission: EM | Admit: 2016-12-12 | Discharge: 2016-12-14 | DRG: 607 | Disposition: A | Discharge status: Home / Self Care | Payer: Self-pay | Attending: Internal Medicine | Admitting: Internal Medicine

## 2016-12-12 ENCOUNTER — Observation Stay (HOSPITAL_COMMUNITY): Payer: Self-pay

## 2016-12-12 ENCOUNTER — Encounter (HOSPITAL_COMMUNITY): Payer: Self-pay | Admitting: Emergency Medicine

## 2016-12-12 DIAGNOSIS — R222 Localized swelling, mass and lump, trunk: Principal | ICD-10-CM | POA: Diagnosis present

## 2016-12-12 DIAGNOSIS — R229 Localized swelling, mass and lump, unspecified: Secondary | ICD-10-CM

## 2016-12-12 DIAGNOSIS — R202 Paresthesia of skin: Secondary | ICD-10-CM | POA: Diagnosis present

## 2016-12-12 DIAGNOSIS — D649 Anemia, unspecified: Secondary | ICD-10-CM | POA: Diagnosis present

## 2016-12-12 DIAGNOSIS — Z23 Encounter for immunization: Secondary | ICD-10-CM

## 2016-12-12 DIAGNOSIS — K5909 Other constipation: Secondary | ICD-10-CM | POA: Diagnosis present

## 2016-12-12 DIAGNOSIS — IMO0002 Reserved for concepts with insufficient information to code with codable children: Secondary | ICD-10-CM

## 2016-12-12 DIAGNOSIS — Z79899 Other long term (current) drug therapy: Secondary | ICD-10-CM

## 2016-12-12 DIAGNOSIS — R590 Localized enlarged lymph nodes: Secondary | ICD-10-CM | POA: Diagnosis present

## 2016-12-12 DIAGNOSIS — R011 Cardiac murmur, unspecified: Secondary | ICD-10-CM | POA: Diagnosis present

## 2016-12-12 DIAGNOSIS — R200 Anesthesia of skin: Secondary | ICD-10-CM | POA: Diagnosis present

## 2016-12-12 DIAGNOSIS — K5900 Constipation, unspecified: Secondary | ICD-10-CM | POA: Diagnosis present

## 2016-12-12 DIAGNOSIS — Z800 Family history of malignant neoplasm of digestive organs: Secondary | ICD-10-CM

## 2016-12-12 DIAGNOSIS — Z91041 Radiographic dye allergy status: Secondary | ICD-10-CM

## 2016-12-12 DIAGNOSIS — N2889 Other specified disorders of kidney and ureter: Secondary | ICD-10-CM | POA: Diagnosis present

## 2016-12-12 DIAGNOSIS — S2242XA Multiple fractures of ribs, left side, initial encounter for closed fracture: Secondary | ICD-10-CM | POA: Diagnosis present

## 2016-12-12 DIAGNOSIS — Z90710 Acquired absence of both cervix and uterus: Secondary | ICD-10-CM

## 2016-12-12 DIAGNOSIS — I10 Essential (primary) hypertension: Secondary | ICD-10-CM | POA: Diagnosis present

## 2016-12-12 DIAGNOSIS — R531 Weakness: Secondary | ICD-10-CM

## 2016-12-12 HISTORY — DX: Essential (primary) hypertension: I10

## 2016-12-12 LAB — COMPREHENSIVE METABOLIC PANEL
ALT: 26 U/L (ref 14–54)
AST: 28 U/L (ref 15–41)
Albumin: 4 g/dL (ref 3.5–5.0)
Alkaline Phosphatase: 70 U/L (ref 38–126)
Anion gap: 13 (ref 5–15)
BILIRUBIN TOTAL: 0.5 mg/dL (ref 0.3–1.2)
BUN: 20 mg/dL (ref 6–20)
CHLORIDE: 98 mmol/L — AB (ref 101–111)
CO2: 24 mmol/L (ref 22–32)
Calcium: 9.3 mg/dL (ref 8.9–10.3)
Creatinine, Ser: 1.07 mg/dL — ABNORMAL HIGH (ref 0.44–1.00)
GFR calc Af Amer: >60 mL/min (ref >60)
GFR calc non Af Amer: 54 mL/min — ABNORMAL LOW (ref >60)
Glucose, Bld: 118 mg/dL — ABNORMAL HIGH (ref 65–99)
POTASSIUM: 3.5 mmol/L (ref 3.5–5.1)
Sodium: 135 mmol/L (ref 135–145)
Total Protein: 7.7 g/dL (ref 6.5–8.1)

## 2016-12-12 LAB — CBC WITH DIFFERENTIAL/PLATELET
Basophils Absolute: 0 10*3/uL (ref 0.0–0.1)
Basophils Relative: 0 %
Eosinophils Absolute: 0.2 10*3/uL (ref 0.0–0.7)
Eosinophils Relative: 2 %
HEMATOCRIT: 33 % — AB (ref 36.0–46.0)
HEMOGLOBIN: 10.5 g/dL — AB (ref 12.0–15.0)
Lymphocytes Relative: 18 %
Lymphs Abs: 1.4 10*3/uL (ref 0.7–4.0)
MCH: 26.6 pg (ref 26.0–34.0)
MCHC: 31.8 g/dL (ref 30.0–36.0)
MCV: 83.8 fL (ref 78.0–100.0)
Monocytes Absolute: 0.8 10*3/uL (ref 0.1–1.0)
Monocytes Relative: 10 %
NEUTROS PCT: 70 %
Neutro Abs: 5.6 10*3/uL (ref 1.7–7.7)
Platelets: 370 10*3/uL (ref 150–400)
RBC: 3.94 MIL/uL (ref 3.87–5.11)
RDW: 14.5 % (ref 11.5–15.5)
WBC: 8.1 10*3/uL (ref 4.0–10.5)

## 2016-12-12 LAB — LIPASE, BLOOD: Lipase: 30 U/L (ref 11–51)

## 2016-12-12 MED ORDER — TRIAMTERENE-HCTZ 37.5-25 MG PO TABS
1.0000 | ORAL_TABLET | Freq: Every day | ORAL | Status: DC
  Administered 2016-12-13 – 2016-12-14 (×2): 1 via ORAL
  Filled 2016-12-12 (×2): qty 1

## 2016-12-12 MED ORDER — HEPARIN SODIUM (PORCINE) 5000 UNIT/ML IJ SOLN
5000.0000 [IU] | Freq: Three times a day (TID) | INTRAMUSCULAR | Status: DC
  Administered 2016-12-12 – 2016-12-14 (×5): 5000 [IU] via SUBCUTANEOUS
  Filled 2016-12-12 (×5): qty 1

## 2016-12-12 MED ORDER — HYDROCORTISONE NA SUCCINATE PF 250 MG IJ SOLR: 200.0000 mg | Freq: Once | INTRAMUSCULAR | Status: DC

## 2016-12-12 MED ORDER — INFLUENZA VAC SPLIT QUAD 0.5 ML IM SUSY
0.5000 mL | PREFILLED_SYRINGE | INTRAMUSCULAR | Status: AC
  Administered 2016-12-13: 0.5 mL via INTRAMUSCULAR
  Filled 2016-12-12: qty 0.5

## 2016-12-12 MED ORDER — ONDANSETRON HCL 4 MG/2ML IJ SOLN: 4.0000 mg | Freq: Four times a day (QID) | INTRAMUSCULAR | Status: DC | PRN

## 2016-12-12 MED ORDER — HYDROCORTISONE NA SUCCINATE PF 100 MG IJ SOLR
100.0000 mg | INTRAMUSCULAR | Status: AC
  Administered 2016-12-12 (×2): 100 mg via INTRAVENOUS
  Filled 2016-12-12: qty 2

## 2016-12-12 MED ORDER — KETOROLAC TROMETHAMINE 30 MG/ML IJ SOLN
30.0000 mg | Freq: Once | INTRAMUSCULAR | Status: AC
  Administered 2016-12-12: 30 mg via INTRAVENOUS
  Filled 2016-12-12: qty 1

## 2016-12-12 MED ORDER — IOPAMIDOL (ISOVUE-300) INJECTION 61%
100.0000 mL | Freq: Once | INTRAVENOUS | Status: AC | PRN
  Administered 2016-12-12: 100 mL via INTRAVENOUS

## 2016-12-12 MED ORDER — LORAZEPAM 2 MG/ML IJ SOLN: 1.0000 mg | Freq: Every evening | INTRAMUSCULAR | Status: DC | PRN

## 2016-12-12 MED ORDER — PNEUMOCOCCAL VAC POLYVALENT 25 MCG/0.5ML IJ INJ
0.5000 mL | INJECTION | INTRAMUSCULAR | Status: AC
  Administered 2016-12-13: 0.5 mL via INTRAMUSCULAR
  Filled 2016-12-12: qty 0.5

## 2016-12-12 MED ORDER — FAMOTIDINE 20 MG PO TABS
20.0000 mg | ORAL_TABLET | Freq: Two times a day (BID) | ORAL | Status: DC
  Administered 2016-12-12 – 2016-12-14 (×4): 20 mg via ORAL
  Filled 2016-12-12 (×4): qty 1

## 2016-12-12 MED ORDER — ONDANSETRON HCL 4 MG/2ML IJ SOLN
4.0000 mg | Freq: Once | INTRAMUSCULAR | Status: AC
  Administered 2016-12-12: 4 mg via INTRAVENOUS
  Filled 2016-12-12: qty 2

## 2016-12-12 MED ORDER — SODIUM CHLORIDE 0.9 % IV SOLN
INTRAVENOUS | Status: DC
  Administered 2016-12-12: 75 mL/h via INTRAVENOUS

## 2016-12-12 MED ORDER — MAGNESIUM OXIDE 400 (241.3 MG) MG PO TABS
400.0000 mg | ORAL_TABLET | Freq: Two times a day (BID) | ORAL | Status: DC
  Administered 2016-12-12 – 2016-12-14 (×4): 400 mg via ORAL
  Filled 2016-12-12 (×4): qty 1

## 2016-12-12 MED ORDER — SODIUM CHLORIDE 0.9 % IV BOLUS (SEPSIS)
500.0000 mL | Freq: Once | INTRAVENOUS | Status: AC
  Administered 2016-12-12: 500 mL via INTRAVENOUS

## 2016-12-12 MED ORDER — POTASSIUM CHLORIDE IN NACL 20-0.9 MEQ/L-% IV SOLN
INTRAVENOUS | Status: AC
  Administered 2016-12-12: 23:00:00 via INTRAVENOUS

## 2016-12-12 MED ORDER — ONDANSETRON HCL 4 MG PO TABS: 4.0000 mg | ORAL_TABLET | Freq: Four times a day (QID) | ORAL | Status: DC | PRN

## 2016-12-12 MED ORDER — TRAZODONE HCL 50 MG PO TABS
50.0000 mg | ORAL_TABLET | Freq: Every evening | ORAL | Status: DC | PRN
  Administered 2016-12-12: 50 mg via ORAL
  Filled 2016-12-12: qty 1

## 2016-12-12 MED ORDER — HYDROMORPHONE HCL 1 MG/ML IJ SOLN
1.0000 mg | Freq: Once | INTRAMUSCULAR | Status: AC
  Administered 2016-12-12: 1 mg via INTRAVENOUS
  Filled 2016-12-12: qty 1

## 2016-12-12 MED ORDER — DIPHENHYDRAMINE HCL 50 MG/ML IJ SOLN
50.0000 mg | Freq: Once | INTRAMUSCULAR | Status: AC
  Administered 2016-12-12: 50 mg via INTRAVENOUS
  Filled 2016-12-12: qty 1

## 2016-12-12 MED ORDER — HYDROMORPHONE HCL 1 MG/ML IJ SOLN
1.0000 mg | INTRAMUSCULAR | Status: DC | PRN
  Administered 2016-12-13 (×2): 1 mg via INTRAVENOUS
  Filled 2016-12-12 (×2): qty 1

## 2016-12-12 MED ORDER — HYDROCORTISONE NA SUCCINATE PF 250 MG IJ SOLR
200.0000 mg | Freq: Once | INTRAMUSCULAR | Status: DC
  Filled 2016-12-12: qty 200

## 2016-12-12 NOTE — ED Triage Notes (Signed)
Patient noticed tingling in bilateral lower extremities that started Monday afternoon around 1430. States tingling got worse yesterday with pain radiating from mid back down to legs around 2200 last night.  States numbness and tingling makes it hard for her walk now.

## 2016-12-12 NOTE — ED Notes (Signed)
Pt ambulatory to bathroom with no difficulty 

## 2016-12-12 NOTE — ED Provider Notes (Signed)
[facility] EMERGENCY DEPARTMENT Provider Note   CSN: [id number] Arrival date & time: 12/12/16  1031     History   Chief Complaint Chief Complaint  Patient presents with  . Numbness    HPI Katrina [name] is a 66 y.o. female.  Patient with complaint of pain in the back between his shoulder blades for about 2 months.  Associated with a persistent cough.  Eventually started to get pain at the bilateral rib margins coming around to the front epigastric area also some increased discomfort right upper quadrant.  Patient followed by health department.  No x-ray studies.  On Sunday patient had a shooting pain into her upper thigh area.  No symptoms below the knees.  No numbness to the feet or top of the feet.  No incontinence.  And that is persisted since that time.  The back pain is getting worse.  That would be the pain in the upper part of the back of the chest.      Past Medical History:  Diagnosis Date  . Hypertension   . Medical history non-contributory     Patient Active Problem List   Diagnosis Date Noted  . FH: colon cancer 10/22/2013  . RUQ discomfort 10/22/2013    Past Surgical History:  Procedure Laterality Date  . ABDOMINAL HYSTERECTOMY    . BREAST LUMPECTOMY     left  . COLONOSCOPY N/A 11/02/2013   Procedure: COLONOSCOPY;  Surgeon: Danie Binder, MD;  Location: [facility];  Service: Endoscopy;  Laterality: N/A;  1:15 - moved to [place] notified pt  . SHOULDER SURGERY     right    OB History    No data available       Home Medications    Prior to Admission medications   Medication Sig Start Date End Date Taking? Authorizing Provider  ibuprofen (ADVIL,MOTRIN) 200 MG tablet Take 800 mg by mouth every 6 (six) hours as needed for moderate pain.   Yes [provider]  MAGNESIUM PO Take 1 tablet by mouth daily.   Yes [provider]  triamterene-hydrochlorothiazide (DYAZIDE) 37.5-25 MG capsule Take 1 capsule by mouth daily.   Yes  [provider]    Family History Family History  Problem Relation Age of Onset  . Colon cancer Sister        at age 64  . Colon cancer Brother        at age 77    Social History Social History  Substance Use Topics  . Smoking status: Never Smoker  . Smokeless tobacco: Never Used  . Alcohol use No     Allergies   Contrast media [iodinated diagnostic agents]   Review of Systems Review of Systems  Constitutional: Negative for fever.  HENT: Negative for congestion.   Eyes: Negative for redness.  Respiratory: Positive for cough.   Cardiovascular: Positive for chest pain.  Gastrointestinal: Positive for abdominal pain.  Genitourinary: Negative for dysuria.  Musculoskeletal: Positive for back pain.  Skin: Negative for rash.  Neurological: Negative for headaches.  Hematological: Does not bruise/bleed easily.  Psychiatric/Behavioral: Negative for confusion.     Physical Exam Updated Vital Signs BP (!) 171/85   Pulse 82   Temp 98.7 F (37.1 C) (Oral)   Resp 12   Ht 1.727 m (5\' 8" )   Wt 116.6 kg (257 lb)   SpO2 100%   BMI 39.08 kg/m   Physical Exam  Constitutional: She is oriented to person, place,  and time. She appears well-developed and well-nourished. No distress.  HENT:  Head: Normocephalic and atraumatic.  Mouth/Throat: Oropharynx is clear and moist.  Eyes: Pupils are equal, round, and reactive to light. Conjunctivae and EOM are normal.  Neck: Normal range of motion. Neck supple.  Cardiovascular: Normal rate, regular rhythm and normal heart sounds.   Pulmonary/Chest: Effort normal and breath sounds normal. No respiratory distress.  Abdominal: Soft. Bowel sounds are normal. There is no tenderness.  Musculoskeletal: Normal range of motion.  Neurological: She is alert and oriented to person, place, and time. No cranial nerve deficit or sensory deficit. She exhibits normal muscle tone. Coordination normal.  Skin: Skin is warm.  Nursing note and  vitals reviewed.    ED Treatments / Results  Labs (all labs ordered are listed, but only abnormal results are displayed) Labs Reviewed  CBC WITH DIFFERENTIAL/PLATELET - Abnormal; Notable for the following:       Result Value   Hemoglobin 10.5 (*)    HCT 33.0 (*)    All other components within normal limits  COMPREHENSIVE METABOLIC PANEL - Abnormal; Notable for the following:    Chloride 98 (*)    Glucose, Bld 118 (*)    Creatinine, Ser 1.07 (*)    GFR calc non Af Amer 54 (*)    All other components within normal limits  LIPASE, BLOOD    EKG  EKG Interpretation None       Radiology Dg Chest 2 View  Result Date: 12/12/2016 CLINICAL DATA:  Chest and back pain.  Cough EXAM: CHEST  2 VIEW COMPARISON:  01/09/2010 FINDINGS: Fracture of the left fourth rib laterally. Destruction of the left fifth rib laterally with soft tissue mass. Heart size within normal limits. Negative for heart failure. No pleural effusion. Negative for pneumonia. IMPRESSION: Left extrapleural soft tissue mass with destruction of the left fifth rib and fracture left fourth rib. Findings worrisome for metastatic disease or myeloma. Correlate with any history of trauma. CT chest with contrast recommended for further evaluation. Electronically Signed   By: Franchot Gallo M.D.   On: 12/12/2016 13:05    Procedures Procedures (including critical care time)  Medications Ordered in ED Medications  0.9 %  sodium chloride infusion (75 mL/hr Intravenous New Bag/Given 12/12/16 1223)  diphenhydrAMINE (BENADRYL) injection 50 mg (not administered)  sodium chloride 0.9 % bolus 500 mL (0 mLs Intravenous Stopped 12/12/16 1327)  ondansetron (ZOFRAN) injection 4 mg (4 mg Intravenous Given 12/12/16 1221)  HYDROmorphone (DILAUDID) injection 1 mg (1 mg Intravenous Given 12/12/16 1222)  hydrocortisone sodium succinate (SOLU-CORTEF) 100 MG injection 100 mg (100 mg Intravenous Given 12/12/16 1409)     Initial Impression /  Assessment and Plan / ED Course  I have reviewed the triage vital signs and the nursing notes.  Pertinent labs & imaging results that were available during my care of the patient were reviewed by me and considered in my medical decision making (see chart for details).     Workup here which included chest x-ray had some concerning findings.  Patient's labs without any significant abdomen chest x-ray showed evidence of a left extrapleural soft tissue mass with destruction of the left fifth rib and fracture of the left fourth rib.  These findings are worrisome for metastatic disease or perhaps myeloma.  Will get CT chest with contrast to evaluate this further.  Patient states she had a myelogram a month ago without any significant findings there.  Disposition will be based on the CT  chest with contrast.  If it does show evidence concerning for neoplastic process patient probably can be referred to outpatient follow-up with hematology oncology here.  Final Clinical Impressions(s) / ED Diagnoses   Final diagnoses:  Chest wall mass  Closed fracture of multiple ribs of left side, initial encounter    New Prescriptions New Prescriptions   No medications on file     Fredia Sorrow, MD 12/12/16 1530

## 2016-12-12 NOTE — ED Notes (Signed)
Pt ambulated to bathroom with very unsteady gait.  States she normally walks fine and this started yesterday where she is very off balance.

## 2016-12-12 NOTE — H&P (Signed)
History and Physical    NATSHA GUIDRY [name]:[id number] DOB: Apr 06, 1950 DOA: 12/12/2016  PCP: Katrina Macadamia D., PA-C   Patient coming from: Home.  I have personally briefly reviewed patient's old medical records in [place]  Chief Complaint: Lower extremities tingling.  HPI: Katrina [name] is a 66 y.o. female with medical history significant of hypertension, obesity who comes to the emergency department with complaints of having bilateral lower extremities tingling, numbness and weakness that she first experienced Monday after she sneezed. She subsequently is started experiencing episodes of tingling and numbness on Tuesday when she was coughing. After this, she noticed that it would also get worse with deep inspiration. Since yesterday evening, she has been having persistent numbness and lower extremity weakness with inability to ambulate for long period without holding herself for assistance.   She has a history of occasional back pain, but denies significant back pain at this time. No fever, no chills but occasionally night sweats over the past couple months. Denies headache, rhinorrhea, sore throat, wheezing but complains of frequent dry cough. No chest pain, palpitations, dizziness, diaphoresis, PND, orthopnea or pitting edema of the lower extremities. She complains of decreased appetite, frequent epigastric discomfort (which has gotten better with OTC ibuprofen) and food tasting differently over the past few weeks. She has chronic constipation. She denies diarrhea, melena or hematochezia. No polyuria, polydipsia, polyphagia or blurred vision.  ED Course: Initial vital signs temperature 98.21F, pulse 87, respirations 16, blood pressure 172/101 mmHg and O2 sat 98% on room air. She received a 500 mL normal saline bolus, hydromorphone 1 mg IVP 1 Zofran 4 mg IVP 1. She was given hydrocortisone 100 mg IVP 2 doses and Benadryl 50 mg IVP 1 in preparation oh CT imaging with IV  contrast.  Her workup shows WBC of 8.1, hemoglobin 10.5 g/dL and platelets 370. Lipase level was 30. Her CMP showed a chloride of 98 mmol/L, creatinine of 1.07 and nonfasting glucose 118 mg/dL, but all other values were normal.  Imaging: CT scan chest/abdomen/pelvis with IV contrast showed:  1. Extensive mesenteric, retroperitoneal, left hilar, mediastinal, left supraclavicular and left axillary adenopathy. Differential considerations include metastatic adenopathy, lymphoma and leukemia. The size of the nodes in the mesentery and retroperitoneum are suggestive of non-Hodgkin's lymphoma. 2. 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 Differential considerations include metastatic disease and lymphoma. A left upper lobe primary lung carcinoma is a possibility. 3. 3.2 cm solid right renal mass. Differential considerations include renal cell carcinoma and oncocytoma. 4. Small amount of free peritoneal fluid, most likely due to lymphatic obstruction by the mesenteric adenopathy. 5. Left fourth rib fracture. 6. Colonic diverticulosis. 7. Calcific coronary artery and aortic atherosclerosis. Aortic Atherosclerosis (ICD10-I70.0).  Please see images and full radiology report for further detail.  Review of Systems: As per HPI otherwise 10 point review of systems negative.    Past Medical History:  Diagnosis Date  . Hypertension   . Medical history non-contributory     Past Surgical History:  Procedure Laterality Date  . ABDOMINAL HYSTERECTOMY    . BREAST LUMPECTOMY     left  . COLONOSCOPY N/A 11/02/2013   Procedure: COLONOSCOPY;  Surgeon: Danie Binder, MD;  Location: [facility];  Service: Endoscopy;  Laterality: N/A;  1:15 - moved to [place] notified pt  . SHOULDER SURGERY     right     reports that she has never smoked.  She has never used smokeless tobacco. She reports that she does not drink alcohol or use  drugs.  Allergies  Allergen Reactions  . Contrast Media [Iodinated Diagnostic Agents] Itching    Family History  Problem Relation Age of Onset  . Colon cancer Sister        at age 89  . Colon cancer Brother        at age 75  . COPD Mother   . Diabetes Mellitus II Mother   . CAD Mother        Angina  . Colon cancer Father   . Thyroid disease Sister   . Diabetes Mellitus II Sister     Prior to Admission medications   Medication Sig Start Date End Date Taking? Authorizing Provider  ibuprofen (ADVIL,MOTRIN) 200 MG tablet Take 800 mg by mouth every 6 (six) hours as needed for moderate pain.   Yes [provider]  MAGNESIUM PO Take 1 tablet by mouth daily.   Yes [provider]  triamterene-hydrochlorothiazide (DYAZIDE) 37.5-25 MG capsule Take 1 capsule by mouth daily.   Yes [provider]    Physical Exam: Vitals:   12/12/16 1930 12/12/16 2000 12/12/16 2030 12/12/16 2130  BP: 133/86 (!) 146/84 (!) 142/77 (!) 152/73  Pulse: 96 93 97 (!) 102  Resp: 18 19 (!) 24 18  Temp:      TempSrc:      SpO2: 94% 93% 97% 97%  Weight:      Height:        Constitutional: NAD, calm, comfortable Eyes: PERRL, lids and conjunctivae normal ENMT: Mucous membranes are moist. Posterior pharynx clear of any exudate or lesions. Neck: normal, supple, no masses, no thyromegaly Respiratory: clear to auscultation bilaterally, no wheezing, no crackles. Normal respiratory effort. No accessory muscle use.  Cardiovascular: Regular rate and rhythm, no murmurs / rubs / gallops. No extremity edema. 2+ pedal pulses. No carotid bruits.  Abdomen: Obese, Bowel sounds positive. soft, mild epigastric and RUQ tenderness, no guarding or rebound. No masses palpated. No hepatosplenomegaly. Mild bilateral CVA tenderness on percussion.  Musculoskeletal: no clubbing / cyanosis. Good ROM, no contractures. Normal muscle tone.  Skin: no significant rashes, lesions, ulcers on limited skin  exam. Neurologic: CN 2-12 grossly intact. Sensation intact, DTR normal. B/L mild lower extremities weakness.  Psychiatric: Normal judgment and insight. Alert and oriented x 4. Mildly anxious mood.    Labs on Admission: I have personally reviewed following labs and imaging studies  CBC:  Recent Labs Lab 12/12/16 1217  WBC 8.1  NEUTROABS 5.6  HGB 10.5*  HCT 33.0*  MCV 83.8  PLT 347   Basic Metabolic Panel:  Recent Labs Lab 12/12/16 1217  NA 135  K 3.5  CL 98*  CO2 24  GLUCOSE 118*  BUN 20  CREATININE 1.07*  CALCIUM 9.3   GFR: Estimated Creatinine Clearance: 71.3 mL/min (A) (by C-G formula based on SCr of 1.07 mg/dL (H)). Liver Function Tests:  Recent Labs Lab 12/12/16 1217  AST 28  ALT 26  ALKPHOS 70  BILITOT 0.5  PROT 7.7  ALBUMIN 4.0    Recent Labs Lab 12/12/16 1217  LIPASE 30   No results for input(s): AMMONIA in the last 168 hours. Coagulation Profile: No results for input(s): INR, PROTIME in the last 168 hours. Cardiac Enzymes: No results for input(s): CKTOTAL, CKMB, CKMBINDEX, TROPONINI in the last 168 hours. BNP (last 3 results) No results for input(s): PROBNP in the last 8760 hours. HbA1C: No  results for input(s): HGBA1C in the last 72 hours. CBG: No results for input(s): GLUCAP in the last 168 hours. Lipid Profile: No results for input(s): CHOL, HDL, LDLCALC, TRIG, CHOLHDL, LDLDIRECT in the last 72 hours. Thyroid Function Tests: No results for input(s): TSH, T4TOTAL, FREET4, T3FREE, THYROIDAB in the last 72 hours. Anemia Panel: No results for input(s): VITAMINB12, FOLATE, FERRITIN, TIBC, IRON, RETICCTPCT in the last 72 hours. Urine analysis: No results found for: COLORURINE, APPEARANCEUR, LABSPEC, [place], GLUCOSEU, [place], BILIRUBINUR, KETONESUR, PROTEINUR, UROBILINOGEN, NITRITE, LEUKOCYTESUR  Radiological Exams on Admission: Dg Chest 2 View  Result Date: 12/12/2016 CLINICAL DATA:  Chest and back pain.  Cough EXAM: CHEST  2 VIEW  COMPARISON:  01/09/2010 FINDINGS: Fracture of the left fourth rib laterally. Destruction of the left fifth rib laterally with soft tissue mass. Heart size within normal limits. Negative for heart failure. No pleural effusion. Negative for pneumonia. IMPRESSION: Left extrapleural soft tissue mass with destruction of the left fifth rib and fracture left fourth rib. Findings worrisome for metastatic disease or myeloma. Correlate with any history of trauma. CT chest with contrast recommended for further evaluation. Electronically Signed   By: Franchot Gallo M.D.   On: 12/12/2016 13:05   Ct Head Wo Contrast  Result Date: 12/12/2016 CLINICAL DATA:  Patient complaining of pain between the shoulder blades for 2 months. EXAM: CT HEAD WITHOUT CONTRAST TECHNIQUE: Contiguous axial images were obtained from the base of the skull through the vertex without intravenous contrast. COMPARISON:  None. FINDINGS: Brain: No evidence of acute infarction, hemorrhage, hydrocephalus, extra-axial collection or mass lesion/mass effect. Vascular: No hyperdense vessel or unexpected calcification. Skull: Normal. Negative for fracture or focal lesion. Sinuses/Orbits: No acute finding. Other: None. IMPRESSION: No focal acute intracranial abnormality identified. Electronically Signed   By: Abelardo Diesel M.D.   On: 12/12/2016 21:47   Ct Chest W Contrast  Result Date: 12/12/2016 CLINICAL DATA:  Upper back pain for the past 2 months, increasing. Persistent cough. Epigastric and right upper quadrant abdominal pain. Shooting pain in the upper right thigh. Left extrapleural soft tissue mass with rib destruction on chest radiographs earlier today. EXAM: CT CHEST, ABDOMEN, AND PELVIS WITH CONTRAST TECHNIQUE: Multidetector CT imaging of the chest, abdomen and pelvis was performed following the standard protocol during bolus administration of intravenous contrast. CONTRAST:  156mL ISOVUE-300 IOPAMIDOL (ISOVUE-300) INJECTION 61% COMPARISON:  None.  Chest radiographs dated 12/12/2016. FINDINGS: CT CHEST FINDINGS Cardiovascular: Normal sized heart. Atheromatous arterial calcifications, including the coronary arteries and aorta. Mediastinum/Nodes: Enlarged AP window and left hilar lymph nodes. An AP window node has a short axis diameter of 14 mm on image number 25 series 2. A left hilar node has a short axis diameter of 21 mm on image number 30 series 2. There are multiple additional enlarged left hilar nodes. No enlarged right hilar nodes. There are 2 enlarged left axillary nodes. The more anterior node has a short axis diameter of 17 mm on image number 17 series 2 and the more posterior node has a short axis diameter 17 mm on image number 30 series 2. There are multiple enlarged left supraclavicular nodes. The largest has a short axis diameter of 15 mm on image number 8 series 2. A left superior mediastinal node has a short axis diameter of 10 mm on image number 14 series 2. Normal appearing thyroid gland. Lungs/Pleura: There are multiple left upper lobe nodules. The majority are adjacent to a left chest wall mass. The largest of these has a maximum  diameter of 1.7 cm on image number 58 series 4. A parenchymal nodule has a diameter 1.0 cm on image number 58 series for and an additional adjacent parenchymal nodule has a diameter of 1.0 cm on image number 60 series 4. More centrally in the left upper lobe, there are 2 adjacent nodules. One measures 2.0 cm on image number 56 series 4 and the other measures 1.4 cm on image number 57 series 4. There are mild bronchiectatic changes in the medial aspect of the right lower lobe. No lung nodules are seen no other than the left upper lobe nodules. Musculoskeletal: There are permeative destructive changes involving the posterior, lateral and anterior portions of the left fifth rib with associated lateral and posterior chest wall masses. There is also an anterior chest wall mass at the location of the left fourth costal  cartilage. There is also a mildly displaced fracture of the left fourth rib laterally. The largest chest wall mass is located laterally, with a maximum thickness of 5.2 cm on image number 28 series 2. This involves a large portion of the left fifth rib. A posterior chest wall mass measures 1.7 cm in thickness on image number 18 series 2 and an anterior chest wall mass measures 2.0 cm in thickness on image number 40 series 2. Also noted are thoracic spine degenerative changes. CT ABDOMEN PELVIS FINDINGS Hepatobiliary: Mild sludge in the gallbladder. Normal appearing liver. Pancreas: Unremarkable. No pancreatic ductal dilatation or surrounding inflammatory changes. Spleen: Normal in size without focal abnormality. Adrenals/Urinary Tract: Normal appearing adrenal glands. 3.2 x 3.1 cm solid mass in the posterior aspect of the mid right kidney on image number 71 series 2. Stomach/Bowel: Multiple colonic diverticula, most numerous in the sigmoid and descending colon. Unremarkable stomach and small bowel. No evidence of appendicitis. Vascular/Lymphatic: Multiple enlarged mesenteric and retroperitoneal lymph nodes. These include 2 large areas of confluent adenopathy in the mesentery at the level of the upper pelvis. The more central mass measures 6.5 x 5.7 cm on image number 96 series 2 and the mass on the right measures 8.0 x 3.7 cm on the same image. The largest more proximal mesenteric node has a short axis diameter of 2.7 cm on image number 68 series 2. The largest retroperitoneal node is medial to the left kidney in the para-aortic region and measures 3.5 cm in short axis diameter on image number 69 series 2. Reproductive: Status post hysterectomy. No adnexal masses. Other: Small amount of free peritoneal fluid. Musculoskeletal: Lumbar lower thoracic spine degenerative changes. These include facet degenerative changes with associated grade 1 anterolisthesis at the L4-5 level. No evidence of bony metastatic disease in  the abdomen or pelvis. IMPRESSION: 1. Extensive mesenteric, retroperitoneal, left hilar, mediastinal, left supraclavicular and left axillary adenopathy. Differential considerations include metastatic adenopathy, lymphoma and leukemia. The size of the nodes in the mesentery and retroperitoneum are suggestive of non-Hodgkin's lymphoma. 2. 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 Differential considerations include metastatic disease and lymphoma. A left upper lobe primary lung carcinoma is a possibility. 3. 3.2 cm solid right renal mass. Differential considerations include renal cell carcinoma and oncocytoma. 4. Small amount of free peritoneal fluid, most likely due to lymphatic obstruction by the mesenteric adenopathy. 5. Left fourth rib fracture. 6. Colonic diverticulosis. 7. Calcific coronary artery and aortic atherosclerosis. Aortic Atherosclerosis (ICD10-I70.0). Electronically Signed   By: Claudie Revering M.D.   On: 12/12/2016 19:19   Ct Abdomen  Pelvis W Contrast  Result Date: 12/12/2016 CLINICAL DATA:  Upper back pain for the past 2 months, increasing. Persistent cough. Epigastric and right upper quadrant abdominal pain. Shooting pain in the upper right thigh. Left extrapleural soft tissue mass with rib destruction on chest radiographs earlier today. EXAM: CT CHEST, ABDOMEN, AND PELVIS WITH CONTRAST TECHNIQUE: Multidetector CT imaging of the chest, abdomen and pelvis was performed following the standard protocol during bolus administration of intravenous contrast. CONTRAST:  115mL ISOVUE-300 IOPAMIDOL (ISOVUE-300) INJECTION 61% COMPARISON:  None. Chest radiographs dated 12/12/2016. FINDINGS: CT CHEST FINDINGS Cardiovascular: Normal sized heart. Atheromatous arterial calcifications, including the coronary arteries and aorta. Mediastinum/Nodes: Enlarged AP window and left hilar lymph nodes. An AP window node has a short axis diameter of 14 mm on  image number 25 series 2. A left hilar node has a short axis diameter of 21 mm on image number 30 series 2. There are multiple additional enlarged left hilar nodes. No enlarged right hilar nodes. There are 2 enlarged left axillary nodes. The more anterior node has a short axis diameter of 17 mm on image number 17 series 2 and the more posterior node has a short axis diameter 17 mm on image number 30 series 2. There are multiple enlarged left supraclavicular nodes. The largest has a short axis diameter of 15 mm on image number 8 series 2. A left superior mediastinal node has a short axis diameter of 10 mm on image number 14 series 2. Normal appearing thyroid gland. Lungs/Pleura: There are multiple left upper lobe nodules. The majority are adjacent to a left chest wall mass. The largest of these has a maximum diameter of 1.7 cm on image number 58 series 4. A parenchymal nodule has a diameter 1.0 cm on image number 58 series for and an additional adjacent parenchymal nodule has a diameter of 1.0 cm on image number 60 series 4. More centrally in the left upper lobe, there are 2 adjacent nodules. One measures 2.0 cm on image number 56 series 4 and the other measures 1.4 cm on image number 57 series 4. There are mild bronchiectatic changes in the medial aspect of the right lower lobe. No lung nodules are seen no other than the left upper lobe nodules. Musculoskeletal: There are permeative destructive changes involving the posterior, lateral and anterior portions of the left fifth rib with associated lateral and posterior chest wall masses. There is also an anterior chest wall mass at the location of the left fourth costal cartilage. There is also a mildly displaced fracture of the left fourth rib laterally. The largest chest wall mass is located laterally, with a maximum thickness of 5.2 cm on image number 28 series 2. This involves a large portion of the left fifth rib. A posterior chest wall mass measures 1.7 cm in  thickness on image number 18 series 2 and an anterior chest wall mass measures 2.0 cm in thickness on image number 40 series 2. Also noted are thoracic spine degenerative changes. CT ABDOMEN PELVIS FINDINGS Hepatobiliary: Mild sludge in the gallbladder. Normal appearing liver. Pancreas: Unremarkable. No pancreatic ductal dilatation or surrounding inflammatory changes. Spleen: Normal in size without focal abnormality. Adrenals/Urinary Tract: Normal appearing adrenal glands. 3.2 x 3.1 cm solid mass in the posterior aspect of the mid right kidney on image number 71 series 2. Stomach/Bowel: Multiple colonic diverticula, most numerous in the sigmoid and descending colon. Unremarkable stomach and small bowel. No evidence of appendicitis. Vascular/Lymphatic: Multiple enlarged mesenteric and retroperitoneal lymph nodes.  These include 2 large areas of confluent adenopathy in the mesentery at the level of the upper pelvis. The more central mass measures 6.5 x 5.7 cm on image number 96 series 2 and the mass on the right measures 8.0 x 3.7 cm on the same image. The largest more proximal mesenteric node has a short axis diameter of 2.7 cm on image number 68 series 2. The largest retroperitoneal node is medial to the left kidney in the para-aortic region and measures 3.5 cm in short axis diameter on image number 69 series 2. Reproductive: Status post hysterectomy. No adnexal masses. Other: Small amount of free peritoneal fluid. Musculoskeletal: Lumbar lower thoracic spine degenerative changes. These include facet degenerative changes with associated grade 1 anterolisthesis at the L4-5 level. No evidence of bony metastatic disease in the abdomen or pelvis. IMPRESSION: 1. Extensive mesenteric, retroperitoneal, left hilar, mediastinal, left supraclavicular and left axillary adenopathy. Differential considerations include metastatic adenopathy, lymphoma and leukemia. The size of the nodes in the mesentery and retroperitoneum are  suggestive of non-Hodgkin's lymphoma. 2. 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 Differential considerations include metastatic disease and lymphoma. A left upper lobe primary lung carcinoma is a possibility. 3. 3.2 cm solid right renal mass. Differential considerations include renal cell carcinoma and oncocytoma. 4. Small amount of free peritoneal fluid, most likely due to lymphatic obstruction by the mesenteric adenopathy. 5. Left fourth rib fracture. 6. Colonic diverticulosis. 7. Calcific coronary artery and aortic atherosclerosis. Aortic Atherosclerosis (ICD10-I70.0). Electronically Signed   By: Claudie Revering M.D.   On: 12/12/2016 19:19    EKG: Independently reviewed.   Assessment/Plan Principal Problem:   Numbness and tingling of both lower extremities Suspect possible lymphadenopathy affecting the thoracic and/or lumbar nerve roots. Will keep observation unit/MedSurg tonight. Continue analgesics as needed. Toradol 20 mg IVP 1 dose. Physical therapy evaluation in a.m. If numbness persists or worsens, consider MRI imaging of thoracic/lumbosacral spine. Please also consider neurology evaluation.  Active Problems:   Abdominal lymphadenopathies Continue analgesics as needed. Consult oncology in the morning. Will most likely need biopsy. Considering consultng IR.    Renal mass, left As above. Keep nothing by mouth after midnight. Consider consulting IR in the a.m. for possible biopsy procedure.    Hypertension Continue Dyazide 37.5/25 mg by mouth daily. Monitor blood pressure, electrolytes and renal function.    Systolic heart murmur Check EKG in a.m. Check echocardiogram in the morning.    Anemia Check anemia panel. Monitor hematocrit and hemoglobin.    Constipation Continue magnesium oxide 400 mg by mouth twice a day.    S/P IV contrast study Will hydrate overnight. Follow-up of renal function in the  morning.    DVT prophylaxis: Heparin SQ. Code Status: Full code. Family Communication: Her sister Izora Gala and her daughter-in-law were present in the ED. Disposition Plan: Observation for further workup and evaluation. Consults called: Routine oncology consult, case management and physical therapy. Admission status: Observation/MedSurg.   Reubin Milan MD [facility] Pager [phone].  If 7PM-7AM, please contact night-coverage [email or web address] Password [facility]  12/12/2016, 9:54 PM

## 2016-12-12 NOTE — ED Provider Notes (Signed)
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  Patient will be admitted to medicine for further workup   Milton Ferguson, MD 12/12/16 2009

## 2016-12-12 NOTE — ED Notes (Signed)
Per Anderson Malta in CT, medicate with Hydrocortisone now and in 4 hours will scan.  Benadryl within 1 hour of scan. Pt made aware of this and sister going to get lunch.

## 2016-12-13 ENCOUNTER — Other Ambulatory Visit (HOSPITAL_COMMUNITY): Payer: Self-pay

## 2016-12-13 DIAGNOSIS — R590 Localized enlarged lymph nodes: Secondary | ICD-10-CM

## 2016-12-13 DIAGNOSIS — R202 Paresthesia of skin: Secondary | ICD-10-CM

## 2016-12-13 DIAGNOSIS — N2889 Other specified disorders of kidney and ureter: Secondary | ICD-10-CM

## 2016-12-13 DIAGNOSIS — R200 Anesthesia of skin: Secondary | ICD-10-CM

## 2016-12-13 DIAGNOSIS — I10 Essential (primary) hypertension: Secondary | ICD-10-CM

## 2016-12-13 DIAGNOSIS — R222 Localized swelling, mass and lump, trunk: Principal | ICD-10-CM

## 2016-12-13 LAB — GLUCOSE, CAPILLARY: GLUCOSE-CAPILLARY: 143 mg/dL — AB (ref 65–99)

## 2016-12-13 LAB — IRON AND TIBC
IRON: 51 ug/dL (ref 28–170)
SATURATION RATIOS: 17 % (ref 10.4–31.8)
TIBC: 301 ug/dL (ref 250–450)
UIBC: 250 ug/dL

## 2016-12-13 LAB — BASIC METABOLIC PANEL
ANION GAP: 10 (ref 5–15)
BUN: 23 mg/dL — ABNORMAL HIGH (ref 6–20)
CHLORIDE: 101 mmol/L (ref 101–111)
CO2: 27 mmol/L (ref 22–32)
Calcium: 8.8 mg/dL — ABNORMAL LOW (ref 8.9–10.3)
Creatinine, Ser: 1.12 mg/dL — ABNORMAL HIGH (ref 0.44–1.00)
GFR calc Af Amer: 59 mL/min — ABNORMAL LOW (ref >60)
GFR, EST NON AFRICAN AMERICAN: 51 mL/min — AB (ref >60)
GLUCOSE: 98 mg/dL (ref 65–99)
POTASSIUM: 3.6 mmol/L (ref 3.5–5.1)
Sodium: 138 mmol/L (ref 135–145)

## 2016-12-13 LAB — CBC
HEMATOCRIT: 29 % — AB (ref 36.0–46.0)
HEMOGLOBIN: 9.5 g/dL — AB (ref 12.0–15.0)
MCH: 27.7 pg (ref 26.0–34.0)
MCHC: 32.8 g/dL (ref 30.0–36.0)
MCV: 84.5 fL (ref 78.0–100.0)
PLATELETS: 323 10*3/uL (ref 150–400)
RBC: 3.43 MIL/uL — AB (ref 3.87–5.11)
RDW: 14.6 % (ref 11.5–15.5)
WBC: 7.4 10*3/uL (ref 4.0–10.5)

## 2016-12-13 LAB — RETICULOCYTES
RBC.: 3.43 MIL/uL — ABNORMAL LOW (ref 3.87–5.11)
Retic Count, Absolute: 123.5 10*3/uL (ref 19.0–186.0)
Retic Ct Pct: 3.6 % — ABNORMAL HIGH (ref 0.4–3.1)

## 2016-12-13 LAB — FERRITIN: FERRITIN: 311 ng/mL — AB (ref 11–307)

## 2016-12-13 LAB — APTT: APTT: 37 s — AB (ref 24–36)

## 2016-12-13 LAB — VITAMIN B12: Vitamin B-12: 494 pg/mL (ref 180–914)

## 2016-12-13 LAB — FOLATE: Folate: 19.6 ng/mL (ref >5.9)

## 2016-12-13 LAB — PROTIME-INR
INR: 1.04
PROTHROMBIN TIME: 13.5 s (ref 11.4–15.2)

## 2016-12-13 MED ORDER — OXYCODONE-ACETAMINOPHEN 5-325 MG PO TABS
1.0000 | ORAL_TABLET | ORAL | Status: DC | PRN
  Administered 2016-12-13 – 2016-12-14 (×5): 1 via ORAL
  Filled 2016-12-13 (×5): qty 1

## 2016-12-13 MED ORDER — ACETAMINOPHEN 325 MG PO TABS: 650.0000 mg | ORAL_TABLET | Freq: Four times a day (QID) | ORAL | Status: DC | PRN

## 2016-12-13 MED ORDER — MORPHINE SULFATE (PF) 2 MG/ML IV SOLN: 1.0000 mg | INTRAVENOUS | Status: DC | PRN

## 2016-12-13 NOTE — Progress Notes (Addendum)
Patient ID: Katrina [name], female   DOB: 1950/11/22, 66 y.o.   MRN: [id number]   IR aware of bx request Awaiting review with Radiologist  Will let MD know asap   ADDENDUM:  Dr Vernard Gambles has approved left chest wall mass biopsy Discussed with Dr Cathlean Sauer Pt may be discharged tomorrow Will plan for OP biopsy procedure  Pt will hear from scheduler for time and date ASAP

## 2016-12-13 NOTE — Progress Notes (Signed)
PROGRESS NOTE    Katrina [name]  ZOX:[id number] DOB: 22-Apr-1950 DOA: 12/12/2016 PCP: Raiford Simmonds., PA-C    Brief Narrative:  66 year old female who presented with lotion paresthesias. Patient does have significant past medical history for hypertension and obesity. She complained of bilateral lower extremity paresthesias, associated with weakness for the last 4 days, symptoms have been persistent and worsening to the point where she had difficulty ambulating without assistance. On initial physical examination, temperature 98.7, heart rate 87, respiratory rate 16, blood pressure 142/77, oxygen saturation 97%. Moist mucous membranes, lungs were clear to auscultation bilaterally, heart S1-S2 present rhythmic, no culture murmurs, the abdomen had tenderness in the right upper quadrant and mid epigastrium, positive bilateral costovertebral tenderness to percussion. Lower extremities no edema. Neurologically decreased strength lower extremities. Sodium 135, potassium 3.5, chloride 98, bicarbonate 24, BUN 20, creatinine 1.07, glucose 119, white count 8.1, hematocrit 10.5, hematocrit 33.0, platelets 370. Chest x-ray with left upper chest soft tissue mass with destructive lesions of the ribs. CT chest abdomen pelvis with extensive mesenteric, retroperitoneal, left hilar, mediastinum, left supraclavicular and left axillary adenopathy, right renal mass. EKG was sinus rhythm, left axis deviation and right bundle branch block.   Patient was admitted to hospital with working diagnosis of lower extremity paresthesias likely related to extensive lymphadenopathy, complicated by pathologic fractures, and chest wall mass.    Assessment & Plan:   Principal Problem:   Numbness and tingling of both lower extremities Active Problems:   Hypertension   Anemia   Abdominal lymphadenopathies   Renal mass, left   Constipation   1. Lower extremity paresthesias. Suspected to be related to extensive lymphadenopathy.  Symptoms have improved and patient is able to ambulate with physical therapy.   2. Extensive lymphadenopathy. Including chest wall mass and left axillary lymphadenopathy. I spoke with Oncology and IR, will arrange a follow up as outpatient for IR guided biopsy of chest wall mass, and follow up with results with oncology.   3. HTN. Will continue blood pressure control.   4. Systolic heart failure. Patient clinically euvolemic. Will continue blood pressure control.    DVT prophylaxis: enoxaparin  Code Status: full Family Communication: No family at the bedside Disposition Plan: Home   Consultants:   Oncology  IR  Procedures:     Antimicrobials:       Subjective: Patient with improved paresthesias and pain, able to ambulate with physical therapy, no nausea or vomiting, no chest pain or dyspnea.   Objective: Vitals:   12/12/16 2030 12/12/16 2130 12/12/16 2219 12/13/16 0603  BP: (!) 142/77 (!) 152/73 140/78 138/77  Pulse: 97 (!) 102 95 100  Resp: (!) 24 18 18 18   Temp:   98.2 F (36.8 C) 98.4 F (36.9 C)  TempSrc:   Oral Oral  SpO2: 97% 97% 93% 94%  Weight:   116.5 kg (256 lb 13 oz)   Height:   5\' 8"  (1.727 m)     Intake/Output Summary (Last 24 hours) at 12/13/16 1157 Last data filed at 12/13/16 0323  Gross per 24 hour  Intake          2060.42 ml  Output                0 ml  Net          2060.42 ml   Filed Weights   12/12/16 1049 12/12/16 2219  Weight: 116.6 kg (257 lb) 116.5 kg (256 lb 13 oz)    Examination:   General:  Not in pain or dyspnea, deconditioned Neurology: Awake and alert, non focal  E ENT: mild pallor, no icterus, oral mucosa moist Cardiovascular: No JVD. S1-S2 present, rhythmic, no gallops, rubs, or murmurs. No lower extremity edema. Pulmonary: vesicular breath sounds bilaterally, adequate air movement, no wheezing, rhonchi or rales. Gastrointestinal. Abdomen flat, no organomegaly, non tender, no rebound or guarding Skin. No  rashes Musculoskeletal: no joint deformities     Data Reviewed: I have personally reviewed following labs and imaging studies  CBC:  Recent Labs Lab 12/12/16 1217 12/13/16 0450  WBC 8.1 7.4  NEUTROABS 5.6  --   HGB 10.5* 9.5*  HCT 33.0* 29.0*  MCV 83.8 84.5  PLT 370 595   Basic Metabolic Panel:  Recent Labs Lab 12/12/16 1217 12/13/16 0450  NA 135 138  K 3.5 3.6  CL 98* 101  CO2 24 27  GLUCOSE 118* 98  BUN 20 23*  CREATININE 1.07* 1.12*  CALCIUM 9.3 8.8*   GFR: Estimated Creatinine Clearance: 68 mL/min (A) (by C-G formula based on SCr of 1.12 mg/dL (H)). Liver Function Tests:  Recent Labs Lab 12/12/16 1217  AST 28  ALT 26  ALKPHOS 70  BILITOT 0.5  PROT 7.7  ALBUMIN 4.0    Recent Labs Lab 12/12/16 1217  LIPASE 30   No results for input(s): AMMONIA in the last 168 hours. Coagulation Profile: No results for input(s): INR, PROTIME in the last 168 hours. Cardiac Enzymes: No results for input(s): CKTOTAL, CKMB, CKMBINDEX, TROPONINI in the last 168 hours. BNP (last 3 results) No results for input(s): PROBNP in the last 8760 hours. HbA1C: No results for input(s): HGBA1C in the last 72 hours. CBG: No results for input(s): GLUCAP in the last 168 hours. Lipid Profile: No results for input(s): CHOL, HDL, LDLCALC, TRIG, CHOLHDL, LDLDIRECT in the last 72 hours. Thyroid Function Tests: No results for input(s): TSH, T4TOTAL, FREET4, T3FREE, THYROIDAB in the last 72 hours. Anemia Panel:  Recent Labs  12/13/16 0450  RETICCTPCT 3.6*      Radiology Studies: I have reviewed all of the imaging during this hospital visit personally     Scheduled Meds: . famotidine  20 mg Oral BID  . heparin  5,000 Units Subcutaneous Q8H  . Influenza vac split quadrivalent PF  0.5 mL Intramuscular Tomorrow-1000  . magnesium oxide  400 mg Oral BID  . pneumococcal 23 valent vaccine  0.5 mL Intramuscular Tomorrow-1000  . triamterene-hydrochlorothiazide  1 tablet Oral  Daily   Continuous Infusions:   LOS: 0 days        Cassandre Oleksy Gerome Apley, MD [facility] Pager [phone]

## 2016-12-13 NOTE — Evaluation (Signed)
Physical Therapy Evaluation Patient Details Name: Katrina [name] MRN: [id number] DOB: [birth year] Today's Date: 12/13/2016   History of Present Illness   Katrina [name] is a 66 y.o. female with medical history significant of hypertension, obesity who comes to the emergency department with complaints of having bilateral lower extremities tingling, numbness and weakness that she first experienced Monday after she sneezed. She subsequently is started experiencing episodes of tingling and numbness on Tuesday when she was coughing. After this, she noticed that it would also get worse with deep inspiration. Since yesterday evening, she has been having persistent numbness and lower extremity weakness with inability to ambulate for long period without holding herself for assistance.    She has a history of occasional back pain, but denies significant back pain at this time. No fever, no chills but occasionally night sweats over the past couple months. Denies headache, rhinorrhea, sore throat, wheezing but complains of frequent dry cough. No chest pain, palpitations, dizziness, diaphoresis, PND, orthopnea or pitting edema of the lower extremities. She complains of decreased appetite, frequent epigastric discomfort (which has gotten better with OTC ibuprofen) and food tasting differently over the past few weeks. She has chronic constipation. She denies diarrhea, melena or hematochezia. No polyuria, polydipsia, polyphagia or blurred vision.    Clinical Impression  Katrina [name] is a 66 y.o. female presenting for PT evaluation with impaired balance secondary to BLE sensation deficits. Katrina [name] is currently functioning below her reported baseline function of independent. Currently she requires a front wheeled walker to decrease fall risk and steady balance during gait and functional transfers. Katrina [name] has supportive family and is motivated to work with therapy. Anticipate she will be able to discharge home with  continued Outpatient PT services to address balance deficits. Acute PT will follow throughout length of stay.     Follow Up Recommendations Outpatient PT    Equipment Recommendations  Rolling walker with 5" wheels    Recommendations for Other Services       Precautions / Restrictions Precautions Precautions: Fall Precaution Comments: unsteady with gait and transfers, use rolling walker and gait belt for safety Restrictions Weight Bearing Restrictions: No      Mobility  Bed Mobility Overal bed mobility: Independent   Transfers Overall transfer level: Needs assistance Equipment used: Rolling walker (2 wheeled) Transfers: Sit to/from [facility] (edge of bed, toilet, bedside recliner) Sit to Stand: Supervision Stand pivot transfers: Min guard    General transfer comment: patient unsteady with transfers, dynamic balance throughout stand step transfers improved with use of front wheeled walker  Ambulation/Gait Ambulation/Gait assistance: Min guard Ambulation Distance (Feet): 80 Feet Assistive device: Rolling walker (2 wheeled) Gait Pattern/deviations: Step-through pattern;Decreased stride length;Drifts right/left;Wide base of support   Gait velocity interpretation: Below normal speed for age/gender General Gait Details: Patient with decreaesed gait velocity, wide base of support, uneven step length, excessive trunk sway, BUE in low guard position, 3 episodes with minimum assist from PT to prevent LOB. Patient gait quality and veloxity improved with use of front wheeled walker, more recipricol step pattern and no overt LOB; patient heavily reliant on front wheeled walker for support/balacne.      Balance Overall balance assessment: Needs assistance   Sitting balance-Leahy Scale: Normal Sitting balance - Comments: patient able to maintain midline whiel sittign edge of bed. patient with posterior lean during dyanmic seated balance while donning socks, no overt  LOB. Postural control: Posterior lean Standing balance support: No upper extremity supported Standing  balance-Leahy Scale: Good Standing balance comment: Patient with increased postural sway during static standing with no UE support. Postural sway decreased with BUE support on front wheeled walker. Single Leg Stance - Right Leg: 0 Single Leg Stance - Left Leg: 2     Rhomberg - Eyes Opened: 30 Rhomberg - Eyes Closed: 30 (increaesd postural sway)        Pertinent Vitals/Pain Pain Assessment: No/denies pain    Home Living Family/patient expects to be discharged to:: Private residence Living Arrangements: Alone Available Help at Discharge: Family (son and daughter in-law live close by and can assist when not working (evenings)) Type of Home: House Home Access: Stairs to enter Entrance Stairs-Rails: Can reach both Entrance Stairs-Number of Steps: 3 Home Layout: One level Home Equipment: None      Prior Function Level of Independence: Independent     Comments: patient was able to go to the grocery store and was driving. Every week she was babysitting her grandson and would pick him up from school 2-3x/week        Extremity/Trunk Assessment   Upper Extremity Assessment Upper Extremity Assessment: Overall WFL for tasks assessed (patient with 5/5 strength throughut BUE with MMT and functional performance)    Lower Extremity Assessment Lower Extremity Assessment: Overall WFL for tasks assessed (patient with 5/5 strength throughut BLE with MMT and functional performance)    Cervical / Trunk Assessment Cervical / Trunk Assessment: Normal  Communication   Communication: No difficulties  Cognition Arousal/Alertness: Awake/alert Behavior During Therapy: WFL for tasks assessed/performed Overall Cognitive Status: Within Functional Limits for tasks assessed      General Comments: patietn is agreeable to participate in therapy and demonstrated active listening and an eagerness to  learn, patient with overall pleasant demeanor      General Comments General comments (skin integrity, edema, etc.): Patient has delayed postrual and righting reactions during dynamic balance task requiring external support to prevent LOB.        Assessment/Plan    PT Assessment Patient needs continued PT services  PT Problem List Decreased balance;Decreased mobility;Decreased coordination;Decreased knowledge of use of DME;Impaired sensation       PT Treatment Interventions DME instruction;Gait training;Stair training;Functional mobility training;Balance training;Patient/family education;Therapeutic activities;Therapeutic exercise    PT Goals (Current goals can be found in the Care Plan section)  Acute Rehab PT Goals Patient Stated Goal: to get back home and be able to have better balance to feel safe when playing with grandson PT Goal Formulation: With patient Time For Goal Achievement: 12/16/16 Potential to Achieve Goals: Good    Frequency Min 3X/week   Barriers to discharge           AM-PAC PT "6 Clicks" Daily Activity  Outcome Measure Difficulty turning over in bed (including adjusting bedclothes, sheets and blankets)?: None Difficulty moving from lying on back to sitting on the side of the bed? : None Difficulty sitting down on and standing up from a chair with arms (e.g., wheelchair, bedside commode, etc,.)?: A Little Help needed moving to and from a bed to chair (including a wheelchair)?: A Little Help needed walking in hospital room?: A Little Help needed climbing 3-5 steps with a railing? : A Little 6 Click Score: 20    End of Session Equipment Utilized During Treatment: Gait belt (front wheeled walker) Activity Tolerance: Patient tolerated treatment well Patient left: in chair;with call bell/phone within reach   PT Visit Diagnosis: Unsteadiness on feet (R26.81);Difficulty in walking, not elsewhere classified (R26.2);History of falling (  Z91.81);Other  abnormalities of gait and mobility (R26.89);Other symptoms and signs involving the nervous system (R29.898)    Time: 5427-0623 PT Time Calculation (min) (ACUTE ONLY): 37 min   Charges:   PT Evaluation $PT Eval Moderate Complexity: 1 Mod PT Treatments $Gait Training: 8-22 mins $Therapeutic Activity: 8-22 mins   PT G Codes:   PT G-Codes **NOT FOR INPATIENT CLASS** Functional Assessment Tool Used: AM-PAC 6 Clicks Basic Mobility Functional Limitation: Mobility: Walking and moving around;Changing and maintaining body position;Carrying, moving and handling objects Mobility: Walking and Moving Around Current Status [phone]): At least 20 percent but less than 40 percent impaired, limited or restricted Mobility: Walking and Moving Around Goal Status [phone]): At least 20 percent but less than 40 percent impaired, limited or restricted Mobility: Walking and Moving Around Discharge Status [phone]): At least 20 percent but less than 40 percent impaired, limited or restricted Changing and Maintaining Body Position Current Status (X1062): At least 20 percent but less than 40 percent impaired, limited or restricted Changing and Maintaining Body Position Goal Status (I9485): At least 20 percent but less than 40 percent impaired, limited or restricted Changing and Maintaining Body Position Discharge Status [phone]): At least 20 percent but less than 40 percent impaired, limited or restricted Carrying, Moving and Handling Objects Current Status [phone]): At least 20 percent but less than 40 percent impaired, limited or restricted Carrying, Moving and Handling Objects Goal Status [phone]): At least 20 percent but less than 40 percent impaired, limited or restricted Carrying, Moving and Handling Objects Discharge Status [phone]): At least 20 percent but less than 40 percent impaired, limited or restricted    Debara Pickett, PT, DPT Physical Therapist with [facility]  12/13/2016 11:10 AM

## 2016-12-13 NOTE — Progress Notes (Signed)
PROGRESS NOTE   I saw and evaluated the patient, I personally obtain the key portions of the history and physical exam, I reviewed the physician assistant student documentation and agree with the physician assistant student medical decision-making. Further assessment and plan are as follows:  Please see attending's daily progress note.  Mauricio Arrien M.D.  Katrina [name] UVO:[id number] DOB: Jan 28, 1951 DOA: 12/12/2016 PCP: Royce Macadamia D., PA-C   LOS: 0 days   Brief Narrative / Interim history: 66 year-old female presents with 2-week history of subcostal rib and thoracic spine pain that she originally attributed to ACEI-associated cough. Earlier this week, she developed numbness and weakness in the lower extremities which prompted her to seek medical attention in the ED at [facility] yesterday. Workup included CXR and CT-chest that revealed extensive mesenteric and retroperitoneal adenopathy as well as chest wall mass and renal mass, all highly concerning for malignancy. Pt also with mild anemia. Hb 10.5 yesterday, 9.5 this morning. Pt was admitted for pain management and workup with oncology consult.   Assessment & Plan: Principal Problem:   Numbness and tingling of both lower extremities Active Problems:   Hypertension   Anemia   Abdominal lymphadenopathies   Renal mass, left   Constipation   Numbness and tingling   Abdominal and retroperitoneal lymphadenopathy with renal and chest wall masses Presumed 2/2 malignancy. Oncology consult pending today, IR consulted regarding biopsy, likely will proceed with chest wall mass biopsy at [facility] some time next week. ? Pathological fracture of rib apparent on imaging, may be responsible for much of the patient's pain. Currently, pain is controlled with dilaudid 1 mg q 4 hours PRN. Will continue.   Hypertension Controlled. Continuing Maxzide.  Anemia  Normocytic anemia in the setting of suspected malignancy. Hb 9.5 today, down from  10.5 yesterday. Will check iron studies and B12. Will reassess CBC in the morning.   Lower extremity paraesthesias Better today. Pt is ambulating normally. Given the size and number of masses and abnormal lymph nodes, the patient's numbness may be a result of spinal nerve compression. The patient has not had back pain, urinary/bowel incontinence or saddle anaesthesia. Will continue to monitor clinically.    DVT prophylaxis: Lovenox Code Status: full code Family Communication: pt's close friend at bedside Disposition Plan: home when medically clear with oncology to follow  Consultants:   Oncology  Interventional radiology  Procedures:    Antimicrobials:   Subjective: Pt is feeling some better today, was able to ambulate without pain or weakness in legs. Pt is understandably anxious about her condition and has many questions about the diagnosis and workup. Appetite is good. Pain is well-controlled with PRN Dilaudid.  Objective: Vitals:   12/12/16 2030 12/12/16 2130 12/12/16 2219 12/13/16 0603  BP: (!) 142/77 (!) 152/73 140/78 138/77  Pulse: 97 (!) 102 95 100  Resp: (!) 24 18 18 18   Temp:   98.2 F (36.8 C) 98.4 F (36.9 C)  TempSrc:   Oral Oral  SpO2: 97% 97% 93% 94%  Weight:   116.5 kg (256 lb 13 oz)   Height:   5\' 8"  (1.727 m)     Intake/Output Summary (Last 24 hours) at 12/13/16 1246 Last data filed at 12/13/16 0323  Gross per 24 hour  Intake          2060.42 ml  Output                0 ml  Net  2060.42 ml   Filed Weights   12/12/16 1049 12/12/16 2219  Weight: 116.6 kg (257 lb) 116.5 kg (256 lb 13 oz)    Examination:  Constitutional: NAD Eyes: PERRL, lids and conjunctivae normal ENMT: Mucous membranes are moist. No oropharyngeal exudates Neck: normal, supple, no masses, no thyromegaly Respiratory: clear to auscultation bilaterally, no wheezing, no crackles. Normal respiratory effort. No accessory muscle use.  Cardiovascular: Regular rate and rhythm,  no murmurs / rubs / gallops. No LE edema. 2+ pedal pulses. No carotid bruits.  Abdomen: no tenderness. Bowel sounds positive.  Musculoskeletal: no clubbing / cyanosis. No joint deformity upper and lower extremities. No contractures. Normal muscle tone.  Skin: no rashes, lesions, ulcers. No induration Neurologic: CN 2-12 grossly intact. Strength 5/5 in all 4.  Psychiatric: Normal judgment and insight. Alert and oriented x 3. Normal mood.    Data Reviewed: I have independently reviewed following labs and imaging studies   CBC:  Recent Labs Lab 12/12/16 1217 12/13/16 0450  WBC 8.1 7.4  NEUTROABS 5.6  --   HGB 10.5* 9.5*  HCT 33.0* 29.0*  MCV 83.8 84.5  PLT 370 010   Basic Metabolic Panel:  Recent Labs Lab 12/12/16 1217 12/13/16 0450  NA 135 138  K 3.5 3.6  CL 98* 101  CO2 24 27  GLUCOSE 118* 98  BUN 20 23*  CREATININE 1.07* 1.12*  CALCIUM 9.3 8.8*   GFR: Estimated Creatinine Clearance: 68 mL/min (A) (by C-G formula based on SCr of 1.12 mg/dL (H)). Liver Function Tests:  Recent Labs Lab 12/12/16 1217  AST 28  ALT 26  ALKPHOS 70  BILITOT 0.5  PROT 7.7  ALBUMIN 4.0    Recent Labs Lab 12/12/16 1217  LIPASE 30   No results for input(s): AMMONIA in the last 168 hours. Coagulation Profile: No results for input(s): INR, PROTIME in the last 168 hours. Cardiac Enzymes: No results for input(s): CKTOTAL, CKMB, CKMBINDEX, TROPONINI in the last 168 hours. BNP (last 3 results) No results for input(s): PROBNP in the last 8760 hours. HbA1C: No results for input(s): HGBA1C in the last 72 hours. CBG: No results for input(s): GLUCAP in the last 168 hours. Lipid Profile: No results for input(s): CHOL, HDL, LDLCALC, TRIG, CHOLHDL, LDLDIRECT in the last 72 hours. Thyroid Function Tests: No results for input(s): TSH, T4TOTAL, FREET4, T3FREE, THYROIDAB in the last 72 hours. Anemia Panel:  Recent Labs  12/13/16 0450  RETICCTPCT 3.6*   Urine analysis: No results  found for: COLORURINE, APPEARANCEUR, LABSPEC, PHURINE, GLUCOSEU, HGBUR, BILIRUBINUR, KETONESUR, PROTEINUR, UROBILINOGEN, NITRITE, LEUKOCYTESUR Sepsis Labs: Invalid input(s): PROCALCITONIN, LACTICIDVEN  No results found for this or any previous visit (from the past 240 hour(s)).    Radiology Studies: Dg Chest 2 View  Result Date: 12/12/2016 CLINICAL DATA:  Chest and back pain.  Cough EXAM: CHEST  2 VIEW COMPARISON:  01/09/2010 FINDINGS: Fracture of the left fourth rib laterally. Destruction of the left fifth rib laterally with soft tissue mass. Heart size within normal limits. Negative for heart failure. No pleural effusion. Negative for pneumonia. IMPRESSION: Left extrapleural soft tissue mass with destruction of the left fifth rib and fracture left fourth rib. Findings worrisome for metastatic disease or myeloma. Correlate with any history of trauma. CT chest with contrast recommended for further evaluation. Electronically Signed   By: Franchot Gallo M.D.   On: 12/12/2016 13:05   Ct Head Wo Contrast  Result Date: 12/12/2016 CLINICAL DATA:  Patient complaining of pain between the shoulder blades  for 2 months. EXAM: CT HEAD WITHOUT CONTRAST TECHNIQUE: Contiguous axial images were obtained from the base of the skull through the vertex without intravenous contrast. COMPARISON:  None. FINDINGS: Brain: No evidence of acute infarction, hemorrhage, hydrocephalus, extra-axial collection or mass lesion/mass effect. Vascular: No hyperdense vessel or unexpected calcification. Skull: Normal. Negative for fracture or focal lesion. Sinuses/Orbits: No acute finding. Other: None. IMPRESSION: No focal acute intracranial abnormality identified. Electronically Signed   By: Abelardo Diesel M.D.   On: 12/12/2016 21:47   Ct Chest W Contrast  Result Date: 12/12/2016 CLINICAL DATA:  Upper back pain for the past 2 months, increasing. Persistent cough. Epigastric and right upper quadrant abdominal pain. Shooting pain in  the upper right thigh. Left extrapleural soft tissue mass with rib destruction on chest radiographs earlier today. EXAM: CT CHEST, ABDOMEN, AND PELVIS WITH CONTRAST TECHNIQUE: Multidetector CT imaging of the chest, abdomen and pelvis was performed following the standard protocol during bolus administration of intravenous contrast. CONTRAST:  141mL ISOVUE-300 IOPAMIDOL (ISOVUE-300) INJECTION 61% COMPARISON:  None. Chest radiographs dated 12/12/2016. FINDINGS: CT CHEST FINDINGS Cardiovascular: Normal sized heart. Atheromatous arterial calcifications, including the coronary arteries and aorta. Mediastinum/Nodes: Enlarged AP window and left hilar lymph nodes. An AP window node has a short axis diameter of 14 mm on image number 25 series 2. A left hilar node has a short axis diameter of 21 mm on image number 30 series 2. There are multiple additional enlarged left hilar nodes. No enlarged right hilar nodes. There are 2 enlarged left axillary nodes. The more anterior node has a short axis diameter of 17 mm on image number 17 series 2 and the more posterior node has a short axis diameter 17 mm on image number 30 series 2. There are multiple enlarged left supraclavicular nodes. The largest has a short axis diameter of 15 mm on image number 8 series 2. A left superior mediastinal node has a short axis diameter of 10 mm on image number 14 series 2. Normal appearing thyroid gland. Lungs/Pleura: There are multiple left upper lobe nodules. The majority are adjacent to a left chest wall mass. The largest of these has a maximum diameter of 1.7 cm on image number 58 series 4. A parenchymal nodule has a diameter 1.0 cm on image number 58 series for and an additional adjacent parenchymal nodule has a diameter of 1.0 cm on image number 60 series 4. More centrally in the left upper lobe, there are 2 adjacent nodules. One measures 2.0 cm on image number 56 series 4 and the other measures 1.4 cm on image number 57 series 4. There are  mild bronchiectatic changes in the medial aspect of the right lower lobe. No lung nodules are seen no other than the left upper lobe nodules. Musculoskeletal: There are permeative destructive changes involving the posterior, lateral and anterior portions of the left fifth rib with associated lateral and posterior chest wall masses. There is also an anterior chest wall mass at the location of the left fourth costal cartilage. There is also a mildly displaced fracture of the left fourth rib laterally. The largest chest wall mass is located laterally, with a maximum thickness of 5.2 cm on image number 28 series 2. This involves a large portion of the left fifth rib. A posterior chest wall mass measures 1.7 cm in thickness on image number 18 series 2 and an anterior chest wall mass measures 2.0 cm in thickness on image number 40 series 2. Also noted are thoracic  spine degenerative changes. CT ABDOMEN PELVIS FINDINGS Hepatobiliary: Mild sludge in the gallbladder. Normal appearing liver. Pancreas: Unremarkable. No pancreatic ductal dilatation or surrounding inflammatory changes. Spleen: Normal in size without focal abnormality. Adrenals/Urinary Tract: Normal appearing adrenal glands. 3.2 x 3.1 cm solid mass in the posterior aspect of the mid right kidney on image number 71 series 2. Stomach/Bowel: Multiple colonic diverticula, most numerous in the sigmoid and descending colon. Unremarkable stomach and small bowel. No evidence of appendicitis. Vascular/Lymphatic: Multiple enlarged mesenteric and retroperitoneal lymph nodes. These include 2 large areas of confluent adenopathy in the mesentery at the level of the upper pelvis. The more central mass measures 6.5 x 5.7 cm on image number 96 series 2 and the mass on the right measures 8.0 x 3.7 cm on the same image. The largest more proximal mesenteric node has a short axis diameter of 2.7 cm on image number 68 series 2. The largest retroperitoneal node is medial to the left  kidney in the para-aortic region and measures 3.5 cm in short axis diameter on image number 69 series 2. Reproductive: Status post hysterectomy. No adnexal masses. Other: Small amount of free peritoneal fluid. Musculoskeletal: Lumbar lower thoracic spine degenerative changes. These include facet degenerative changes with associated grade 1 anterolisthesis at the L4-5 level. No evidence of bony metastatic disease in the abdomen or pelvis. IMPRESSION: 1. Extensive mesenteric, retroperitoneal, left hilar, mediastinal, left supraclavicular and left axillary adenopathy. Differential considerations include metastatic adenopathy, lymphoma and leukemia. The size of the nodes in the mesentery and retroperitoneum are suggestive of non-Hodgkin's lymphoma. 2. 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 Differential considerations include metastatic disease and lymphoma. A left upper lobe primary lung carcinoma is a possibility. 3. 3.2 cm solid right renal mass. Differential considerations include renal cell carcinoma and oncocytoma. 4. Small amount of free peritoneal fluid, most likely due to lymphatic obstruction by the mesenteric adenopathy. 5. Left fourth rib fracture. 6. Colonic diverticulosis. 7. Calcific coronary artery and aortic atherosclerosis. Aortic Atherosclerosis (ICD10-I70.0). Electronically Signed   By: Claudie Revering M.D.   On: 12/12/2016 19:19   Ct Abdomen Pelvis W Contrast  Result Date: 12/12/2016 CLINICAL DATA:  Upper back pain for the past 2 months, increasing. Persistent cough. Epigastric and right upper quadrant abdominal pain. Shooting pain in the upper right thigh. Left extrapleural soft tissue mass with rib destruction on chest radiographs earlier today. EXAM: CT CHEST, ABDOMEN, AND PELVIS WITH CONTRAST TECHNIQUE: Multidetector CT imaging of the chest, abdomen and pelvis was performed following the standard protocol during bolus  administration of intravenous contrast. CONTRAST:  153mL ISOVUE-300 IOPAMIDOL (ISOVUE-300) INJECTION 61% COMPARISON:  None. Chest radiographs dated 12/12/2016. FINDINGS: CT CHEST FINDINGS Cardiovascular: Normal sized heart. Atheromatous arterial calcifications, including the coronary arteries and aorta. Mediastinum/Nodes: Enlarged AP window and left hilar lymph nodes. An AP window node has a short axis diameter of 14 mm on image number 25 series 2. A left hilar node has a short axis diameter of 21 mm on image number 30 series 2. There are multiple additional enlarged left hilar nodes. No enlarged right hilar nodes. There are 2 enlarged left axillary nodes. The more anterior node has a short axis diameter of 17 mm on image number 17 series 2 and the more posterior node has a short axis diameter 17 mm on image number 30 series 2. There are multiple enlarged left supraclavicular nodes. The largest has a short axis diameter of 15  mm on image number 8 series 2. A left superior mediastinal node has a short axis diameter of 10 mm on image number 14 series 2. Normal appearing thyroid gland. Lungs/Pleura: There are multiple left upper lobe nodules. The majority are adjacent to a left chest wall mass. The largest of these has a maximum diameter of 1.7 cm on image number 58 series 4. A parenchymal nodule has a diameter 1.0 cm on image number 58 series for and an additional adjacent parenchymal nodule has a diameter of 1.0 cm on image number 60 series 4. More centrally in the left upper lobe, there are 2 adjacent nodules. One measures 2.0 cm on image number 56 series 4 and the other measures 1.4 cm on image number 57 series 4. There are mild bronchiectatic changes in the medial aspect of the right lower lobe. No lung nodules are seen no other than the left upper lobe nodules. Musculoskeletal: There are permeative destructive changes involving the posterior, lateral and anterior portions of the left fifth rib with associated  lateral and posterior chest wall masses. There is also an anterior chest wall mass at the location of the left fourth costal cartilage. There is also a mildly displaced fracture of the left fourth rib laterally. The largest chest wall mass is located laterally, with a maximum thickness of 5.2 cm on image number 28 series 2. This involves a large portion of the left fifth rib. A posterior chest wall mass measures 1.7 cm in thickness on image number 18 series 2 and an anterior chest wall mass measures 2.0 cm in thickness on image number 40 series 2. Also noted are thoracic spine degenerative changes. CT ABDOMEN PELVIS FINDINGS Hepatobiliary: Mild sludge in the gallbladder. Normal appearing liver. Pancreas: Unremarkable. No pancreatic ductal dilatation or surrounding inflammatory changes. Spleen: Normal in size without focal abnormality. Adrenals/Urinary Tract: Normal appearing adrenal glands. 3.2 x 3.1 cm solid mass in the posterior aspect of the mid right kidney on image number 71 series 2. Stomach/Bowel: Multiple colonic diverticula, most numerous in the sigmoid and descending colon. Unremarkable stomach and small bowel. No evidence of appendicitis. Vascular/Lymphatic: Multiple enlarged mesenteric and retroperitoneal lymph nodes. These include 2 large areas of confluent adenopathy in the mesentery at the level of the upper pelvis. The more central mass measures 6.5 x 5.7 cm on image number 96 series 2 and the mass on the right measures 8.0 x 3.7 cm on the same image. The largest more proximal mesenteric node has a short axis diameter of 2.7 cm on image number 68 series 2. The largest retroperitoneal node is medial to the left kidney in the para-aortic region and measures 3.5 cm in short axis diameter on image number 69 series 2. Reproductive: Status post hysterectomy. No adnexal masses. Other: Small amount of free peritoneal fluid. Musculoskeletal: Lumbar lower thoracic spine degenerative changes. These include  facet degenerative changes with associated grade 1 anterolisthesis at the L4-5 level. No evidence of bony metastatic disease in the abdomen or pelvis. IMPRESSION: 1. Extensive mesenteric, retroperitoneal, left hilar, mediastinal, left supraclavicular and left axillary adenopathy. Differential considerations include metastatic adenopathy, lymphoma and leukemia. The size of the nodes in the mesentery and retroperitoneum are suggestive of non-Hodgkin's lymphoma. 2. 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 Differential considerations include metastatic disease and lymphoma. A left upper lobe primary lung carcinoma is a possibility. 3. 3.2 cm solid right renal mass. Differential considerations include renal cell carcinoma  and oncocytoma. 4. Small amount of free peritoneal fluid, most likely due to lymphatic obstruction by the mesenteric adenopathy. 5. Left fourth rib fracture. 6. Colonic diverticulosis. 7. Calcific coronary artery and aortic atherosclerosis. Aortic Atherosclerosis (ICD10-I70.0). Electronically Signed   By: Claudie Revering M.D.   On: 12/12/2016 19:19     Scheduled Meds: . famotidine  20 mg Oral BID  . heparin  5,000 Units Subcutaneous Q8H  . magnesium oxide  400 mg Oral BID  . triamterene-hydrochlorothiazide  1 tablet Oral Daily   Continuous Infusions:     Time spent: 15 minutes    Audery Amel, PA-S  12/13/2016, 12:46 PM   [email or web address] @

## 2016-12-13 NOTE — Care Management Note (Signed)
Case Management Note  Patient Details  Name: Katrina [name] MRN: [id number] Date of Birth: [birth year]  Subjective/Objective:   Adm with BLE numbness and tingling. From home alone, ind pta. CT with ? Metastatic lymphoma. Patient has been going to [place]. Has an appointment to establish care with Dr. Karie Kirks. She will have Medicare insurance Nov 1st. Currently does not have insurance. Recommended for OP PT and RW. Patient agreeable to referral for OP PT but plans to start after her insurance has taken effect. She declines RW, states she may be able to borrow from a friend or will buy one from retailer.                  Action/Plan:   Anticipate DC home with self care. She plans to stay with her son and Daughter in [place].   Expected Discharge Date:  12/16/16               Expected Discharge Plan:  Home  In-House Referral:     Discharge planning Services  CM Consult  Post Acute Care Choice:    Choice offered to:  Patient  DME Arranged:    DME Agency:     HH Arranged:    [place] Agency:     Status of Service:  Completed, signed off  If discussed at [employer] of Stay Meetings, dates discussed:    Additional Comments:  [place] Zirkelbach, Chauncey [place], RN 12/13/2016, 2:35 PM

## 2016-12-13 NOTE — Consult Note (Signed)
[facility]  Name: Katrina [name]      MRN: [id number]    Location: A313/A313-01  Date: 12/13/2016 Time:11:48 AM   REFERRING PHYSICIAN:  Dr. Tennis Must, Hospitalist at [facility]:  Concern for metastatic lymphoma on recent CT imaging    DIAGNOSIS:  Concern for metastatic cancer  HISTORY OF PRESENT ILLNESS:  Katrina [name] 66 y.o. female initially presented to [facility] ED on 12/12/16 with complaints of upper back pain x 2 months, persistent cough, and abdominal discomfort.  CXR on 12/12/16 revealed (L) extrapleural soft tissue mass with destruction of (L) 5th rib and fracture of (L) 4th rib. Findings concerning for metastatic disease or myeloma per radiologist.  CT chest/abd/pelvis done on 12/12/16 revealed extensive mesenteric, retroperitoneal, left hilar, mediastinal, left supraclavicular, and left axillary adenopathy; also noted to have multiple left upper lobe nodules and left chest wall metastases, primarily arising from the left fifth rib.  There is also a 3.2 cm solid right renal mass, concerning for malignancy as well.  There is a small amount of free peritoneal fluid, most likely due to lymphatic obstruction by the mesenteric adenopathy.  CT head also done on 12/12/16 revealing no acute abnormality.  She was admitted for further workup and management of her current symptoms.     PAST MEDICAL HISTORY:   Past Medical History:  Diagnosis Date  . Hypertension   . Medical history non-contributory     ALLERGIES: Allergies  Allergen Reactions  . Contrast Media [Iodinated Diagnostic Agents] Itching      MEDICATIONS: I have reviewed the patient's current medications.     PAST SURGICAL HISTORY Past Surgical History:  Procedure Laterality Date  . ABDOMINAL HYSTERECTOMY    . BREAST LUMPECTOMY     left  . COLONOSCOPY N/A 11/02/2013   Procedure: COLONOSCOPY;  Surgeon: Danie Binder, MD;  Location: [facility];  Service:  Endoscopy;  Laterality: N/A;  1:15 - moved to [facility] notified pt  . SHOULDER SURGERY     right    FAMILY HISTORY: Family History  Problem Relation Age of Onset  . Colon cancer Sister        at age 56  . Colon cancer Brother        at age 16  . COPD Mother   . Diabetes Mellitus II Mother   . CAD Mother        Angina  . Colon cancer Father   . Thyroid disease Sister   . Diabetes Mellitus II Sister     SOCIAL HISTORY:  reports that she has never smoked. She has never used smokeless tobacco. She reports that she does not drink alcohol or use drugs.   PHYSICAL EXAM: Most Recent Vital Signs: Blood pressure 138/77, pulse 100, temperature 98.4 F (36.9 C), temperature source Oral, resp. rate 18, height 5' 8"  (1.727 m), weight 256 lb 13 oz (116.5 kg), SpO2 94 %.   LABORATORY DATA:  Results for orders placed or performed during the hospital encounter of 12/12/16 (from the past 48 hour(s))  CBC with Differential/Platelet     Status: Abnormal   Collection Time: 12/12/16 12:17 PM  Result Value Ref Range   WBC 8.1 4.0 - 10.5 K/uL   RBC 3.94 3.87 - 5.11 MIL/uL   Hemoglobin 10.5 (L) 12.0 - 15.0 g/dL   HCT 33.0 (L) 36.0 - 46.0 %   MCV 83.8 78.0 - 100.0 fL   MCH 26.6 26.0 -  34.0 pg   MCHC 31.8 30.0 - 36.0 g/dL   RDW 14.5 11.5 - 15.5 %   Platelets 370 150 - 400 K/uL   Neutrophils Relative % 70 %   Neutro Abs 5.6 1.7 - 7.7 K/uL   Lymphocytes Relative 18 %   Lymphs Abs 1.4 0.7 - 4.0 K/uL   Monocytes Relative 10 %   Monocytes Absolute 0.8 0.1 - 1.0 K/uL   Eosinophils Relative 2 %   Eosinophils Absolute 0.2 0.0 - 0.7 K/uL   Basophils Relative 0 %   Basophils Absolute 0.0 0.0 - 0.1 K/uL  Comprehensive metabolic panel     Status: Abnormal   Collection Time: 12/12/16 12:17 PM  Result Value Ref Range   Sodium 135 135 - 145 mmol/L   Potassium 3.5 3.5 - 5.1 mmol/L   Chloride 98 (L) 101 - 111 mmol/L   CO2 24 22 - 32 mmol/L   Glucose, Bld 118 (H) 65 - 99 mg/dL   BUN 20 6 - 20 mg/dL    Creatinine, Ser 1.07 (H) 0.44 - 1.00 mg/dL   Calcium 9.3 8.9 - 10.3 mg/dL   Total Protein 7.7 6.5 - 8.1 g/dL   Albumin 4.0 3.5 - 5.0 g/dL   AST 28 15 - 41 U/L   ALT 26 14 - 54 U/L   Alkaline Phosphatase 70 38 - 126 U/L   Total Bilirubin 0.5 0.3 - 1.2 mg/dL   GFR calc non Af Amer 54 (L) >60 mL/min   GFR calc Af Amer >60 >60 mL/min    Comment: (NOTE) The eGFR has been calculated using the CKD EPI equation. This calculation has not been validated in all clinical situations. eGFR's persistently <60 mL/min signify possible Chronic Kidney Disease.    Anion gap 13 5 - 15  Lipase, blood     Status: None   Collection Time: 12/12/16 12:17 PM  Result Value Ref Range   Lipase 30 11 - 51 U/L  CBC     Status: Abnormal   Collection Time: 12/13/16  4:50 AM  Result Value Ref Range   WBC 7.4 4.0 - 10.5 K/uL   RBC 3.43 (L) 3.87 - 5.11 MIL/uL   Hemoglobin 9.5 (L) 12.0 - 15.0 g/dL   HCT 29.0 (L) 36.0 - 46.0 %   MCV 84.5 78.0 - 100.0 fL   MCH 27.7 26.0 - 34.0 pg   MCHC 32.8 30.0 - 36.0 g/dL   RDW 14.6 11.5 - 15.5 %   Platelets 323 150 - 400 K/uL  Basic metabolic panel     Status: Abnormal   Collection Time: 12/13/16  4:50 AM  Result Value Ref Range   Sodium 138 135 - 145 mmol/L   Potassium 3.6 3.5 - 5.1 mmol/L   Chloride 101 101 - 111 mmol/L   CO2 27 22 - 32 mmol/L   Glucose, Bld 98 65 - 99 mg/dL   BUN 23 (H) 6 - 20 mg/dL   Creatinine, Ser 1.12 (H) 0.44 - 1.00 mg/dL   Calcium 8.8 (L) 8.9 - 10.3 mg/dL   GFR calc non Af Amer 51 (L) >60 mL/min   GFR calc Af Amer 59 (L) >60 mL/min    Comment: (NOTE) The eGFR has been calculated using the CKD EPI equation. This calculation has not been validated in all clinical situations. eGFR's persistently <60 mL/min signify possible Chronic Kidney Disease.    Anion gap 10 5 - 15  Reticulocytes     Status: Abnormal   Collection  Time: 12/13/16  4:50 AM  Result Value Ref Range   Retic Ct Pct 3.6 (H) 0.4 - 3.1 %   RBC. 3.43 (L) 3.87 - 5.11 MIL/uL    Retic Count, Absolute 123.5 19.0 - 186.0 K/uL      RADIOGRAPHY: Dg Chest 2 View  Result Date: 12/12/2016 CLINICAL DATA:  Chest and back pain.  Cough EXAM: CHEST  2 VIEW COMPARISON:  01/09/2010 FINDINGS: Fracture of the left fourth rib laterally. Destruction of the left fifth rib laterally with soft tissue mass. Heart size within normal limits. Negative for heart failure. No pleural effusion. Negative for pneumonia. IMPRESSION: Left extrapleural soft tissue mass with destruction of the left fifth rib and fracture left fourth rib. Findings worrisome for metastatic disease or myeloma. Correlate with any history of trauma. CT chest with contrast recommended for further evaluation. Electronically Signed   By: Franchot Gallo M.D.   On: 12/12/2016 13:05   Ct Head Wo Contrast  Result Date: 12/12/2016 CLINICAL DATA:  Patient complaining of pain between the shoulder blades for 2 months. EXAM: CT HEAD WITHOUT CONTRAST TECHNIQUE: Contiguous axial images were obtained from the base of the skull through the vertex without intravenous contrast. COMPARISON:  None. FINDINGS: Brain: No evidence of acute infarction, hemorrhage, hydrocephalus, extra-axial collection or mass lesion/mass effect. Vascular: No hyperdense vessel or unexpected calcification. Skull: Normal. Negative for fracture or focal lesion. Sinuses/Orbits: No acute finding. Other: None. IMPRESSION: No focal acute intracranial abnormality identified. Electronically Signed   By: Abelardo Diesel M.D.   On: 12/12/2016 21:47   Ct Chest W Contrast  Result Date: 12/12/2016 CLINICAL DATA:  Upper back pain for the past 2 months, increasing. Persistent cough. Epigastric and right upper quadrant abdominal pain. Shooting pain in the upper right thigh. Left extrapleural soft tissue mass with rib destruction on chest radiographs earlier today. EXAM: CT CHEST, ABDOMEN, AND PELVIS WITH CONTRAST TECHNIQUE: Multidetector CT imaging of the chest, abdomen and pelvis was  performed following the standard protocol during bolus administration of intravenous contrast. CONTRAST:  169m ISOVUE-300 IOPAMIDOL (ISOVUE-300) INJECTION 61% COMPARISON:  None. Chest radiographs dated 12/12/2016. FINDINGS: CT CHEST FINDINGS Cardiovascular: Normal sized heart. Atheromatous arterial calcifications, including the coronary arteries and aorta. Mediastinum/Nodes: Enlarged AP window and left hilar lymph nodes. An AP window node has a short axis diameter of 14 mm on image number 25 series 2. A left hilar node has a short axis diameter of 21 mm on image number 30 series 2. There are multiple additional enlarged left hilar nodes. No enlarged right hilar nodes. There are 2 enlarged left axillary nodes. The more anterior node has a short axis diameter of 17 mm on image number 17 series 2 and the more posterior node has a short axis diameter 17 mm on image number 30 series 2. There are multiple enlarged left supraclavicular nodes. The largest has a short axis diameter of 15 mm on image number 8 series 2. A left superior mediastinal node has a short axis diameter of 10 mm on image number 14 series 2. Normal appearing thyroid gland. Lungs/Pleura: There are multiple left upper lobe nodules. The majority are adjacent to a left chest wall mass. The largest of these has a maximum diameter of 1.7 cm on image number 58 series 4. A parenchymal nodule has a diameter 1.0 cm on image number 58 series for and an additional adjacent parenchymal nodule has a diameter of 1.0 cm on image number 60 series 4. More centrally in the left upper  lobe, there are 2 adjacent nodules. One measures 2.0 cm on image number 56 series 4 and the other measures 1.4 cm on image number 57 series 4. There are mild bronchiectatic changes in the medial aspect of the right lower lobe. No lung nodules are seen no other than the left upper lobe nodules. Musculoskeletal: There are permeative destructive changes involving the posterior, lateral and  anterior portions of the left fifth rib with associated lateral and posterior chest wall masses. There is also an anterior chest wall mass at the location of the left fourth costal cartilage. There is also a mildly displaced fracture of the left fourth rib laterally. The largest chest wall mass is located laterally, with a maximum thickness of 5.2 cm on image number 28 series 2. This involves a large portion of the left fifth rib. A posterior chest wall mass measures 1.7 cm in thickness on image number 18 series 2 and an anterior chest wall mass measures 2.0 cm in thickness on image number 40 series 2. Also noted are thoracic spine degenerative changes. CT ABDOMEN PELVIS FINDINGS Hepatobiliary: Mild sludge in the gallbladder. Normal appearing liver. Pancreas: Unremarkable. No pancreatic ductal dilatation or surrounding inflammatory changes. Spleen: Normal in size without focal abnormality. Adrenals/Urinary Tract: Normal appearing adrenal glands. 3.2 x 3.1 cm solid mass in the posterior aspect of the mid right kidney on image number 71 series 2. Stomach/Bowel: Multiple colonic diverticula, most numerous in the sigmoid and descending colon. Unremarkable stomach and small bowel. No evidence of appendicitis. Vascular/Lymphatic: Multiple enlarged mesenteric and retroperitoneal lymph nodes. These include 2 large areas of confluent adenopathy in the mesentery at the level of the upper pelvis. The more central mass measures 6.5 x 5.7 cm on image number 96 series 2 and the mass on the right measures 8.0 x 3.7 cm on the same image. The largest more proximal mesenteric node has a short axis diameter of 2.7 cm on image number 68 series 2. The largest retroperitoneal node is medial to the left kidney in the para-aortic region and measures 3.5 cm in short axis diameter on image number 69 series 2. Reproductive: Status post hysterectomy. No adnexal masses. Other: Small amount of free peritoneal fluid. Musculoskeletal: Lumbar lower  thoracic spine degenerative changes. These include facet degenerative changes with associated grade 1 anterolisthesis at the L4-5 level. No evidence of bony metastatic disease in the abdomen or pelvis. IMPRESSION: 1. Extensive mesenteric, retroperitoneal, left hilar, mediastinal, left supraclavicular and left axillary adenopathy. Differential considerations include metastatic adenopathy, lymphoma and leukemia. The size of the nodes in the mesentery and retroperitoneum are suggestive of non-Hodgkin's lymphoma. 2. 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 Differential considerations include metastatic disease and lymphoma. A left upper lobe primary lung carcinoma is a possibility. 3. 3.2 cm solid right renal mass. Differential considerations include renal cell carcinoma and oncocytoma. 4. Small amount of free peritoneal fluid, most likely due to lymphatic obstruction by the mesenteric adenopathy. 5. Left fourth rib fracture. 6. Colonic diverticulosis. 7. Calcific coronary artery and aortic atherosclerosis. Aortic Atherosclerosis (ICD10-I70.0). Electronically Signed   By: Claudie Revering M.D.   On: 12/12/2016 19:19   Ct Abdomen Pelvis W Contrast  Result Date: 12/12/2016 CLINICAL DATA:  Upper back pain for the past 2 months, increasing. Persistent cough. Epigastric and right upper quadrant abdominal pain. Shooting pain in the upper right thigh. Left extrapleural soft tissue mass with rib destruction on chest radiographs earlier today.  EXAM: CT CHEST, ABDOMEN, AND PELVIS WITH CONTRAST TECHNIQUE: Multidetector CT imaging of the chest, abdomen and pelvis was performed following the standard protocol during bolus administration of intravenous contrast. CONTRAST:  171m ISOVUE-300 IOPAMIDOL (ISOVUE-300) INJECTION 61% COMPARISON:  None. Chest radiographs dated 12/12/2016. FINDINGS: CT CHEST FINDINGS Cardiovascular: Normal sized heart. Atheromatous arterial  calcifications, including the coronary arteries and aorta. Mediastinum/Nodes: Enlarged AP window and left hilar lymph nodes. An AP window node has a short axis diameter of 14 mm on image number 25 series 2. A left hilar node has a short axis diameter of 21 mm on image number 30 series 2. There are multiple additional enlarged left hilar nodes. No enlarged right hilar nodes. There are 2 enlarged left axillary nodes. The more anterior node has a short axis diameter of 17 mm on image number 17 series 2 and the more posterior node has a short axis diameter 17 mm on image number 30 series 2. There are multiple enlarged left supraclavicular nodes. The largest has a short axis diameter of 15 mm on image number 8 series 2. A left superior mediastinal node has a short axis diameter of 10 mm on image number 14 series 2. Normal appearing thyroid gland. Lungs/Pleura: There are multiple left upper lobe nodules. The majority are adjacent to a left chest wall mass. The largest of these has a maximum diameter of 1.7 cm on image number 58 series 4. A parenchymal nodule has a diameter 1.0 cm on image number 58 series for and an additional adjacent parenchymal nodule has a diameter of 1.0 cm on image number 60 series 4. More centrally in the left upper lobe, there are 2 adjacent nodules. One measures 2.0 cm on image number 56 series 4 and the other measures 1.4 cm on image number 57 series 4. There are mild bronchiectatic changes in the medial aspect of the right lower lobe. No lung nodules are seen no other than the left upper lobe nodules. Musculoskeletal: There are permeative destructive changes involving the posterior, lateral and anterior portions of the left fifth rib with associated lateral and posterior chest wall masses. There is also an anterior chest wall mass at the location of the left fourth costal cartilage. There is also a mildly displaced fracture of the left fourth rib laterally. The largest chest wall mass is  located laterally, with a maximum thickness of 5.2 cm on image number 28 series 2. This involves a large portion of the left fifth rib. A posterior chest wall mass measures 1.7 cm in thickness on image number 18 series 2 and an anterior chest wall mass measures 2.0 cm in thickness on image number 40 series 2. Also noted are thoracic spine degenerative changes. CT ABDOMEN PELVIS FINDINGS Hepatobiliary: Mild sludge in the gallbladder. Normal appearing liver. Pancreas: Unremarkable. No pancreatic ductal dilatation or surrounding inflammatory changes. Spleen: Normal in size without focal abnormality. Adrenals/Urinary Tract: Normal appearing adrenal glands. 3.2 x 3.1 cm solid mass in the posterior aspect of the mid right kidney on image number 71 series 2. Stomach/Bowel: Multiple colonic diverticula, most numerous in the sigmoid and descending colon. Unremarkable stomach and small bowel. No evidence of appendicitis. Vascular/Lymphatic: Multiple enlarged mesenteric and retroperitoneal lymph nodes. These include 2 large areas of confluent adenopathy in the mesentery at the level of the upper pelvis. The more central mass measures 6.5 x 5.7 cm on image number 96 series 2 and the mass on the right measures 8.0 x 3.7 cm on the same image.  The largest more proximal mesenteric node has a short axis diameter of 2.7 cm on image number 68 series 2. The largest retroperitoneal node is medial to the left kidney in the para-aortic region and measures 3.5 cm in short axis diameter on image number 69 series 2. Reproductive: Status post hysterectomy. No adnexal masses. Other: Small amount of free peritoneal fluid. Musculoskeletal: Lumbar lower thoracic spine degenerative changes. These include facet degenerative changes with associated grade 1 anterolisthesis at the L4-5 level. No evidence of bony metastatic disease in the abdomen or pelvis. IMPRESSION: 1. Extensive mesenteric, retroperitoneal, left hilar, mediastinal, left  supraclavicular and left axillary adenopathy. Differential considerations include metastatic adenopathy, lymphoma and leukemia. The size of the nodes in the mesentery and retroperitoneum are suggestive of non-Hodgkin's lymphoma. 2. 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 Differential considerations include metastatic disease and lymphoma. A left upper lobe primary lung carcinoma is a possibility. 3. 3.2 cm solid right renal mass. Differential considerations include renal cell carcinoma and oncocytoma. 4. Small amount of free peritoneal fluid, most likely due to lymphatic obstruction by the mesenteric adenopathy. 5. Left fourth rib fracture. 6. Colonic diverticulosis. 7. Calcific coronary artery and aortic atherosclerosis. Aortic Atherosclerosis (ICD10-I70.0). Electronically Signed   By: Claudie Revering M.D.   On: 12/12/2016 19:19       PATHOLOGY:           ASSESSMENT & PLAN:   Extensive adenopathy with chest wall/mass:  -Images reviewed by Dr. Oliva Bustard.  Difficult to ascertain primary malignancy given multiple sites of adenopathy, nodules, and masses. She may also have 2 primary malignancies.  Differential diagnoses include: metastatic lymphoma, primary bronchogenic carcinoma with diffuse metastatic disease, or metastatic renal cell carcinoma.  -Would recommend image-guided biopsy of chest wall mass, as well as any easily accessible lymph node to confirm primary and metastatic disease.   -We can obtain PET scan as an outpatient if needed, once primary malignancy is established.   -Difficult to make treatment recommendations without definitive pathology. Will await those results and we are happy to see the patient in the outpatient setting to discuss treatment options.        Dispo:  -Recommend image-guided biopsy of chest wall mass +/- accessible lymph node for definitive diagnosis.   -Can obtain PET scan as an outpatient.    -Please contact cancer center when patient will be discharged and we can make arrangements to see her for outpatient consultation after current hospitalization.     The patient and plan discussed with Dr. Oliva Bustard and he is in agreement with the aforementioned. The plan was discussed by Dr. Oliva Bustard with Dr. Cathlean Sauer (inpatient hospitalist) via phone.    This patient was not formally seen or examined; no charge for this consultation.     Mike Craze, NP [place] [phone]

## 2016-12-14 ENCOUNTER — Other Ambulatory Visit: Payer: Self-pay | Admitting: Radiology

## 2016-12-14 ENCOUNTER — Inpatient Hospital Stay (HOSPITAL_COMMUNITY): Payer: Self-pay

## 2016-12-14 DIAGNOSIS — D649 Anemia, unspecified: Secondary | ICD-10-CM

## 2016-12-14 LAB — HIV ANTIBODY (ROUTINE TESTING W REFLEX): HIV SCREEN 4TH GENERATION: NONREACTIVE

## 2016-12-14 MED ORDER — OXYCODONE-ACETAMINOPHEN 5-325 MG PO TABS: 1.0000 | ORAL_TABLET | Freq: Four times a day (QID) | ORAL | 0 refills | Status: DC | PRN

## 2016-12-14 MED ORDER — IBUPROFEN 400 MG PO TABS: 400.0000 mg | ORAL_TABLET | Freq: Three times a day (TID) | ORAL | 0 refills | Status: DC | PRN

## 2016-12-14 NOTE — Plan of Care (Signed)
Problem: Activity: Goal: Risk for activity intolerance will decrease Outcome: Progressing Encouraged mobilization to extent of ability

## 2016-12-14 NOTE — Plan of Care (Signed)
Problem: Pain Managment: Goal: General experience of comfort will improve Outcome: Progressing Pt assessed and monitored for pain status

## 2016-12-14 NOTE — Discharge Summary (Signed)
Physician Discharge Summary  JOCELINE HINCHCLIFF HEN:[id number] DOB: 05/21/1950 DOA: 12/12/2016  PCP: Royce Macadamia D., PA-C  Admit date: 12/12/2016 Discharge date: 12/14/2016  Admitted From: Home Disposition:  Home  Recommendations for Outpatient Follow-up:  1. Follow up with PCP in 1 week 2. Interventional radiology will arrange outpatient follow up for left chest wall mass biopsy. 3. Oncology will follow up with patient as outpatient with biopsy results  Home Health: No  Equipment/Devices: Rolling walker  Discharge Condition: Stable CODE STATUS: Full  Diet recommendation: Heart healthy  Brief/Interim Summary: 66 year old female who presented with lower extremity paresthesias. Patient does have significant past medical history for hypertension and obesity. She complained of bilateral lower extremity paresthesias, associated with weakness for the last 4 days, symptoms have been persistent and worsening to the point where she had difficulty ambulating without assistance. On initial physical examination, temperature 98.7, heart rate 87, respiratory rate 16, blood pressure 142/77, oxygen saturation 97%. Moist mucous membranes, lungs were clear to auscultation bilaterally, heart S1-S2 present rhythmic, no rubs, gallops or murmurs, the abdomen had tenderness in the right upper quadrant and mid epigastrium, positive bilateral costovertebral tenderness to percussion. Lower extremities no edema. Neurologically decreased strength lower extremities. Sodium 135, potassium 3.5, chloride 98, bicarbonate 24, BUN 20, creatinine 1.07, glucose 119, white count 8.1, hemaglobin 10.5, hematocrit 33.0, platelets 370. Chest x-ray with left upper chest soft tissue mass with destructive lesions of the loco regional ribs. CT chest abdomen pelvis with extensive mesenteric, retroperitoneal, left hilar, mediastinum, left supraclavicular and left axillary adenopathy, right renal mass. EKG was sinus rhythm, left axis  deviation and right bundle branch block.   Patient was admitted to hospital with working diagnosis of bilateral  lower extremity paresthesias likely related to extensive lymphadenopathy, complicated by pathologic left rib fractures, and chest wall mass.   1. Lower extremity paresthesias. Patient was admitted to the medical ward, she was seen by physical therapy, with recommendations for outpatient PT, and a rolling walker. Her symptoms have improved but still persistent, there are presumed to be related to the extensive lymphadenopathy, that will need further workup as an outpatient.   2. Extensive lymphadenopathy. Positive lymphadenopathy in the chest and abdomen, positive renal mass on the right, left axillary lymph node and chest wall mass, loco regional pathologic rib fractures. Case was discussed with oncology and interventional radiology, patient hemodynamically stable, she will be discharged home with outpatient follow-up with interventional radiology and follow with oncology once results of biopsy become available.   3. Hypertension. Blood pressure remained well-controlled. Patient will resume triamterene/hydrochlorothiazide.   4. Multifactorial anemia. No signs of bleeding, discharge hemoglobin 9.5, follow-up as an outpatient. No current indication for PRBC transfusion.  Discharge Diagnoses:  Principal Problem:   Numbness and tingling of both lower extremities Active Problems:   Hypertension   Anemia   Abdominal lymphadenopathies   Renal mass, left   Constipation   Numbness and tingling    Discharge Instructions  Discharge Instructions    Ambulatory referral to Physical Therapy    Complete by:  As directed      Allergies as of 12/14/2016      Reactions   Contrast Media [iodinated Diagnostic Agents] Itching      Medication List    TAKE these medications   ibuprofen 400 MG tablet Commonly known as:  ADVIL,MOTRIN Take 1 tablet (400 mg total) by mouth every 8 (eight)  hours as needed for moderate pain. What changed:  medication strength  how much to  take  when to take this   MAGNESIUM PO Take 1 tablet by mouth daily.   oxyCODONE-acetaminophen 5-325 MG tablet Commonly known as:  PERCOCET/ROXICET Take 1 tablet by mouth every 6 (six) hours as needed for severe pain.   triamterene-hydrochlorothiazide 37.5-25 MG capsule Commonly known as:  DYAZIDE Take 1 capsule by mouth daily.            Durable Medical Equipment        Start     Ordered   12/14/16 [phone]  For home use only DME Gilford Rile  [facility])  Once    Question:  Patient needs a walker to treat with the following condition  Answer:  Ambulatory dysfunction   12/14/16 0937      Allergies  Allergen Reactions  . Contrast Media [Iodinated Diagnostic Agents] Itching    Consultations:  Interventional radiology  Oncology    Procedures/Studies: Dg Chest 2 View  Result Date: 12/12/2016 CLINICAL DATA:  Chest and back pain.  Cough EXAM: CHEST  2 VIEW COMPARISON:  01/09/2010 FINDINGS: Fracture of the left fourth rib laterally. Destruction of the left fifth rib laterally with soft tissue mass. Heart size within normal limits. Negative for heart failure. No pleural effusion. Negative for pneumonia. IMPRESSION: Left extrapleural soft tissue mass with destruction of the left fifth rib and fracture left fourth rib. Findings worrisome for metastatic disease or myeloma. Correlate with any history of trauma. CT chest with contrast recommended for further evaluation. Electronically Signed   By: Franchot Gallo M.D.   On: 12/12/2016 13:05   Ct Head Wo Contrast  Result Date: 12/12/2016 CLINICAL DATA:  Patient complaining of pain between the shoulder blades for 2 months. EXAM: CT HEAD WITHOUT CONTRAST TECHNIQUE: Contiguous axial images were obtained from the base of the skull through the vertex without intravenous contrast. COMPARISON:  None. FINDINGS: Brain: No evidence of acute infarction, hemorrhage,  hydrocephalus, extra-axial collection or mass lesion/mass effect. Vascular: No hyperdense vessel or unexpected calcification. Skull: Normal. Negative for fracture or focal lesion. Sinuses/Orbits: No acute finding. Other: None. IMPRESSION: No focal acute intracranial abnormality identified. Electronically Signed   By: Abelardo Diesel M.D.   On: 12/12/2016 21:47   Ct Chest W Contrast  Result Date: 12/12/2016 CLINICAL DATA:  Upper back pain for the past 2 months, increasing. Persistent cough. Epigastric and right upper quadrant abdominal pain. Shooting pain in the upper right thigh. Left extrapleural soft tissue mass with rib destruction on chest radiographs earlier today. EXAM: CT CHEST, ABDOMEN, AND PELVIS WITH CONTRAST TECHNIQUE: Multidetector CT imaging of the chest, abdomen and pelvis was performed following the standard protocol during bolus administration of intravenous contrast. CONTRAST:  155mL ISOVUE-300 IOPAMIDOL (ISOVUE-300) INJECTION 61% COMPARISON:  None. Chest radiographs dated 12/12/2016. FINDINGS: CT CHEST FINDINGS Cardiovascular: Normal sized heart. Atheromatous arterial calcifications, including the coronary arteries and aorta. Mediastinum/Nodes: Enlarged AP window and left hilar lymph nodes. An AP window node has a short axis diameter of 14 mm on image number 25 series 2. A left hilar node has a short axis diameter of 21 mm on image number 30 series 2. There are multiple additional enlarged left hilar nodes. No enlarged right hilar nodes. There are 2 enlarged left axillary nodes. The more anterior node has a short axis diameter of 17 mm on image number 17 series 2 and the more posterior node has a short axis diameter 17 mm on image number 30 series 2. There are multiple enlarged left supraclavicular nodes. The largest has a short  axis diameter of 15 mm on image number 8 series 2. A left superior mediastinal node has a short axis diameter of 10 mm on image number 14 series 2. Normal appearing  thyroid gland. Lungs/Pleura: There are multiple left upper lobe nodules. The majority are adjacent to a left chest wall mass. The largest of these has a maximum diameter of 1.7 cm on image number 58 series 4. A parenchymal nodule has a diameter 1.0 cm on image number 58 series for and an additional adjacent parenchymal nodule has a diameter of 1.0 cm on image number 60 series 4. More centrally in the left upper lobe, there are 2 adjacent nodules. One measures 2.0 cm on image number 56 series 4 and the other measures 1.4 cm on image number 57 series 4. There are mild bronchiectatic changes in the medial aspect of the right lower lobe. No lung nodules are seen no other than the left upper lobe nodules. Musculoskeletal: There are permeative destructive changes involving the posterior, lateral and anterior portions of the left fifth rib with associated lateral and posterior chest wall masses. There is also an anterior chest wall mass at the location of the left fourth costal cartilage. There is also a mildly displaced fracture of the left fourth rib laterally. The largest chest wall mass is located laterally, with a maximum thickness of 5.2 cm on image number 28 series 2. This involves a large portion of the left fifth rib. A posterior chest wall mass measures 1.7 cm in thickness on image number 18 series 2 and an anterior chest wall mass measures 2.0 cm in thickness on image number 40 series 2. Also noted are thoracic spine degenerative changes. CT ABDOMEN PELVIS FINDINGS Hepatobiliary: Mild sludge in the gallbladder. Normal appearing liver. Pancreas: Unremarkable. No pancreatic ductal dilatation or surrounding inflammatory changes. Spleen: Normal in size without focal abnormality. Adrenals/Urinary Tract: Normal appearing adrenal glands. 3.2 x 3.1 cm solid mass in the posterior aspect of the mid right kidney on image number 71 series 2. Stomach/Bowel: Multiple colonic diverticula, most numerous in the sigmoid and  descending colon. Unremarkable stomach and small bowel. No evidence of appendicitis. Vascular/Lymphatic: Multiple enlarged mesenteric and retroperitoneal lymph nodes. These include 2 large areas of confluent adenopathy in the mesentery at the level of the upper pelvis. The more central mass measures 6.5 x 5.7 cm on image number 96 series 2 and the mass on the right measures 8.0 x 3.7 cm on the same image. The largest more proximal mesenteric node has a short axis diameter of 2.7 cm on image number 68 series 2. The largest retroperitoneal node is medial to the left kidney in the para-aortic region and measures 3.5 cm in short axis diameter on image number 69 series 2. Reproductive: Status post hysterectomy. No adnexal masses. Other: Small amount of free peritoneal fluid. Musculoskeletal: Lumbar lower thoracic spine degenerative changes. These include facet degenerative changes with associated grade 1 anterolisthesis at the L4-5 level. No evidence of bony metastatic disease in the abdomen or pelvis. IMPRESSION: 1. Extensive mesenteric, retroperitoneal, left hilar, mediastinal, left supraclavicular and left axillary adenopathy. Differential considerations include metastatic adenopathy, lymphoma and leukemia. The size of the nodes in the mesentery and retroperitoneum are suggestive of non-Hodgkin's lymphoma. 2. 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 Differential considerations include metastatic disease and lymphoma. A left upper lobe primary lung carcinoma is a possibility. 3. 3.2 cm solid right renal mass. Differential considerations  include renal cell carcinoma and oncocytoma. 4. Small amount of free peritoneal fluid, most likely due to lymphatic obstruction by the mesenteric adenopathy. 5. Left fourth rib fracture. 6. Colonic diverticulosis. 7. Calcific coronary artery and aortic atherosclerosis. Aortic Atherosclerosis (ICD10-I70.0). Electronically  Signed   By: Claudie Revering M.D.   On: 12/12/2016 19:19   Ct Abdomen Pelvis W Contrast  Result Date: 12/12/2016 CLINICAL DATA:  Upper back pain for the past 2 months, increasing. Persistent cough. Epigastric and right upper quadrant abdominal pain. Shooting pain in the upper right thigh. Left extrapleural soft tissue mass with rib destruction on chest radiographs earlier today. EXAM: CT CHEST, ABDOMEN, AND PELVIS WITH CONTRAST TECHNIQUE: Multidetector CT imaging of the chest, abdomen and pelvis was performed following the standard protocol during bolus administration of intravenous contrast. CONTRAST:  129mL ISOVUE-300 IOPAMIDOL (ISOVUE-300) INJECTION 61% COMPARISON:  None. Chest radiographs dated 12/12/2016. FINDINGS: CT CHEST FINDINGS Cardiovascular: Normal sized heart. Atheromatous arterial calcifications, including the coronary arteries and aorta. Mediastinum/Nodes: Enlarged AP window and left hilar lymph nodes. An AP window node has a short axis diameter of 14 mm on image number 25 series 2. A left hilar node has a short axis diameter of 21 mm on image number 30 series 2. There are multiple additional enlarged left hilar nodes. No enlarged right hilar nodes. There are 2 enlarged left axillary nodes. The more anterior node has a short axis diameter of 17 mm on image number 17 series 2 and the more posterior node has a short axis diameter 17 mm on image number 30 series 2. There are multiple enlarged left supraclavicular nodes. The largest has a short axis diameter of 15 mm on image number 8 series 2. A left superior mediastinal node has a short axis diameter of 10 mm on image number 14 series 2. Normal appearing thyroid gland. Lungs/Pleura: There are multiple left upper lobe nodules. The majority are adjacent to a left chest wall mass. The largest of these has a maximum diameter of 1.7 cm on image number 58 series 4. A parenchymal nodule has a diameter 1.0 cm on image number 58 series for and an additional  adjacent parenchymal nodule has a diameter of 1.0 cm on image number 60 series 4. More centrally in the left upper lobe, there are 2 adjacent nodules. One measures 2.0 cm on image number 56 series 4 and the other measures 1.4 cm on image number 57 series 4. There are mild bronchiectatic changes in the medial aspect of the right lower lobe. No lung nodules are seen no other than the left upper lobe nodules. Musculoskeletal: There are permeative destructive changes involving the posterior, lateral and anterior portions of the left fifth rib with associated lateral and posterior chest wall masses. There is also an anterior chest wall mass at the location of the left fourth costal cartilage. There is also a mildly displaced fracture of the left fourth rib laterally. The largest chest wall mass is located laterally, with a maximum thickness of 5.2 cm on image number 28 series 2. This involves a large portion of the left fifth rib. A posterior chest wall mass measures 1.7 cm in thickness on image number 18 series 2 and an anterior chest wall mass measures 2.0 cm in thickness on image number 40 series 2. Also noted are thoracic spine degenerative changes. CT ABDOMEN PELVIS FINDINGS Hepatobiliary: Mild sludge in the gallbladder. Normal appearing liver. Pancreas: Unremarkable. No pancreatic ductal dilatation or surrounding inflammatory changes. Spleen: Normal in size  without focal abnormality. Adrenals/Urinary Tract: Normal appearing adrenal glands. 3.2 x 3.1 cm solid mass in the posterior aspect of the mid right kidney on image number 71 series 2. Stomach/Bowel: Multiple colonic diverticula, most numerous in the sigmoid and descending colon. Unremarkable stomach and small bowel. No evidence of appendicitis. Vascular/Lymphatic: Multiple enlarged mesenteric and retroperitoneal lymph nodes. These include 2 large areas of confluent adenopathy in the mesentery at the level of the upper pelvis. The more central mass measures 6.5  x 5.7 cm on image number 96 series 2 and the mass on the right measures 8.0 x 3.7 cm on the same image. The largest more proximal mesenteric node has a short axis diameter of 2.7 cm on image number 68 series 2. The largest retroperitoneal node is medial to the left kidney in the para-aortic region and measures 3.5 cm in short axis diameter on image number 69 series 2. Reproductive: Status post hysterectomy. No adnexal masses. Other: Small amount of free peritoneal fluid. Musculoskeletal: Lumbar lower thoracic spine degenerative changes. These include facet degenerative changes with associated grade 1 anterolisthesis at the L4-5 level. No evidence of bony metastatic disease in the abdomen or pelvis. IMPRESSION: 1. Extensive mesenteric, retroperitoneal, left hilar, mediastinal, left supraclavicular and left axillary adenopathy. Differential considerations include metastatic adenopathy, lymphoma and leukemia. The size of the nodes in the mesentery and retroperitoneum are suggestive of non-Hodgkin's lymphoma. 2. 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 Differential considerations include metastatic disease and lymphoma. A left upper lobe primary lung carcinoma is a possibility. 3. 3.2 cm solid right renal mass. Differential considerations include renal cell carcinoma and oncocytoma. 4. Small amount of free peritoneal fluid, most likely due to lymphatic obstruction by the mesenteric adenopathy. 5. Left fourth rib fracture. 6. Colonic diverticulosis. 7. Calcific coronary artery and aortic atherosclerosis. Aortic Atherosclerosis (ICD10-I70.0). Electronically Signed   By: Claudie Revering M.D.   On: 12/12/2016 19:19       Subjective: Patient feeling better, persistent weakness on the lower extremities, no dyspnea, nausea or vomiting. Positive local chest pain on the left hemithorax, improved with analgesics.   Discharge Exam: Vitals:   12/13/16 2100  12/14/16 0546  BP: (!) 125/55 (!) 121/96  Pulse: 83 83  Resp: 18 18  Temp: 98.2 F (36.8 C) 98.1 F (36.7 C)  SpO2: 97% 96%   Vitals:   12/12/16 2219 12/13/16 0603 12/13/16 2100 12/14/16 0546  BP: 140/78 138/77 (!) 125/55 (!) 121/96  Pulse: 95 100 83 83  Resp: 18 18 18 18   Temp: 98.2 F (36.8 C) 98.4 F (36.9 C) 98.2 F (36.8 C) 98.1 F (36.7 C)  TempSrc: Oral Oral Oral Oral  SpO2: 93% 94% 97% 96%  Weight: 116.5 kg (256 lb 13 oz)     Height: 5\' 8"  (1.727 m)       General: Pt is alert, awake, not in acute distress E ENT. No pallor or icterus Cardiovascular: RRR, S1/S2 +, no rubs, no gallops Respiratory: CTA bilaterally, no wheezing, no rhonchi Abdominal: Soft, NT, ND, bowel sounds + Extremities: no edema, no cyanosis    The results of significant diagnostics from this hospitalization (including imaging, microbiology, ancillary and laboratory) are listed below for reference.     Microbiology: No results found for this or any previous visit (from the past 240 hour(s)).   Labs: BNP (last 3 results) No results for input(s): BNP in the last 8760 hours. Basic Metabolic Panel:  Recent Labs Lab 12/12/16 1217 12/13/16 0450  NA 135 138  K 3.5 3.6  CL 98* 101  CO2 24 27  GLUCOSE 118* 98  BUN 20 23*  CREATININE 1.07* 1.12*  CALCIUM 9.3 8.8*   Liver Function Tests:  Recent Labs Lab 12/12/16 1217  AST 28  ALT 26  ALKPHOS 70  BILITOT 0.5  PROT 7.7  ALBUMIN 4.0    Recent Labs Lab 12/12/16 1217  LIPASE 30   No results for input(s): AMMONIA in the last 168 hours. CBC:  Recent Labs Lab 12/12/16 1217 12/13/16 0450  WBC 8.1 7.4  NEUTROABS 5.6  --   HGB 10.5* 9.5*  HCT 33.0* 29.0*  MCV 83.8 84.5  PLT 370 323   Cardiac Enzymes: No results for input(s): CKTOTAL, CKMB, CKMBINDEX, TROPONINI in the last 168 hours. BNP: Invalid input(s): POCBNP CBG:  Recent Labs Lab 12/13/16 1610  GLUCAP 143*   D-Dimer No results for input(s): DDIMER in the  last 72 hours. Hgb A1c No results for input(s): HGBA1C in the last 72 hours. Lipid Profile No results for input(s): CHOL, HDL, LDLCALC, TRIG, CHOLHDL, LDLDIRECT in the last 72 hours. Thyroid function studies No results for input(s): TSH, T4TOTAL, T3FREE, THYROIDAB in the last 72 hours.  Invalid input(s): FREET3 Anemia work up  Recent Labs  12/13/16 0450  VITAMINB12 494  FOLATE 19.6  FERRITIN 311*  TIBC 301  IRON 51  RETICCTPCT 3.6*   Urinalysis No results found for: COLORURINE, APPEARANCEUR, LABSPEC, [place], GLUCOSEU, HGBUR, BILIRUBINUR, KETONESUR, PROTEINUR, UROBILINOGEN, NITRITE, LEUKOCYTESUR Sepsis Labs Invalid input(s): PROCALCITONIN,  WBC,  LACTICIDVEN Microbiology No results found for this or any previous visit (from the past 240 hour(s)).   Time coordinating discharge: 45 minutes  SIGNED:   Tawni Millers, MD  [facility] 12/14/2016, 9:21 AM Pager [phone]  If 7PM-7AM, please contact night-coverage [email or web address] Password [email or web address]

## 2016-12-17 ENCOUNTER — Telehealth (HOSPITAL_COMMUNITY): Payer: Self-pay | Admitting: *Deleted

## 2016-12-17 NOTE — Telephone Encounter (Signed)
12/17/16  cx because she said she has no use of either legs

## 2016-12-18 ENCOUNTER — Emergency Department (HOSPITAL_COMMUNITY): Payer: Medicare Other | Admitting: Certified Registered Nurse Anesthetist

## 2016-12-18 ENCOUNTER — Emergency Department (HOSPITAL_COMMUNITY): Payer: Medicare Other

## 2016-12-18 ENCOUNTER — Encounter (HOSPITAL_COMMUNITY)
Admission: EM | Disposition: A | Discharge status: Rehabilitation Facility | Payer: Self-pay | Source: Home / Self Care | Attending: Neurosurgery

## 2016-12-18 ENCOUNTER — Other Ambulatory Visit: Payer: Self-pay | Admitting: Radiology

## 2016-12-18 ENCOUNTER — Inpatient Hospital Stay (HOSPITAL_COMMUNITY)
Admission: EM | Admit: 2016-12-18 | Discharge: 2016-12-26 | DRG: 029 | Disposition: A | Discharge status: Rehabilitation Facility | Payer: Medicare Other | Attending: Neurosurgery | Admitting: Neurosurgery

## 2016-12-18 ENCOUNTER — Encounter (HOSPITAL_COMMUNITY): Payer: Self-pay

## 2016-12-18 DIAGNOSIS — D492 Neoplasm of unspecified behavior of bone, soft tissue, and skin: Secondary | ICD-10-CM

## 2016-12-18 DIAGNOSIS — D649 Anemia, unspecified: Secondary | ICD-10-CM | POA: Diagnosis present

## 2016-12-18 DIAGNOSIS — R222 Localized swelling, mass and lump, trunk: Secondary | ICD-10-CM | POA: Diagnosis present

## 2016-12-18 DIAGNOSIS — C8330 Diffuse large B-cell lymphoma, unspecified site: Secondary | ICD-10-CM

## 2016-12-18 DIAGNOSIS — G96198 Other disorders of meninges, not elsewhere classified: Secondary | ICD-10-CM

## 2016-12-18 DIAGNOSIS — R918 Other nonspecific abnormal finding of lung field: Secondary | ICD-10-CM | POA: Diagnosis present

## 2016-12-18 DIAGNOSIS — G9619 Other disorders of meninges, not elsewhere classified: Secondary | ICD-10-CM | POA: Diagnosis not present

## 2016-12-18 DIAGNOSIS — Z9889 Other specified postprocedural states: Secondary | ICD-10-CM

## 2016-12-18 DIAGNOSIS — D62 Acute posthemorrhagic anemia: Secondary | ICD-10-CM | POA: Diagnosis not present

## 2016-12-18 DIAGNOSIS — K5900 Constipation, unspecified: Secondary | ICD-10-CM | POA: Diagnosis not present

## 2016-12-18 DIAGNOSIS — N2889 Other specified disorders of kidney and ureter: Secondary | ICD-10-CM | POA: Diagnosis present

## 2016-12-18 DIAGNOSIS — Z91041 Radiographic dye allergy status: Secondary | ICD-10-CM | POA: Diagnosis not present

## 2016-12-18 DIAGNOSIS — R590 Localized enlarged lymph nodes: Secondary | ICD-10-CM | POA: Diagnosis present

## 2016-12-18 DIAGNOSIS — R202 Paresthesia of skin: Secondary | ICD-10-CM | POA: Diagnosis present

## 2016-12-18 DIAGNOSIS — I10 Essential (primary) hypertension: Secondary | ICD-10-CM | POA: Diagnosis present

## 2016-12-18 DIAGNOSIS — D630 Anemia in neoplastic disease: Secondary | ICD-10-CM | POA: Diagnosis present

## 2016-12-18 DIAGNOSIS — Z419 Encounter for procedure for purposes other than remedying health state, unspecified: Secondary | ICD-10-CM

## 2016-12-18 DIAGNOSIS — G8220 Paraplegia, unspecified: Secondary | ICD-10-CM | POA: Diagnosis present

## 2016-12-18 DIAGNOSIS — G9520 Unspecified cord compression: Secondary | ICD-10-CM | POA: Diagnosis present

## 2016-12-18 DIAGNOSIS — D72829 Elevated white blood cell count, unspecified: Secondary | ICD-10-CM | POA: Diagnosis not present

## 2016-12-18 DIAGNOSIS — D509 Iron deficiency anemia, unspecified: Secondary | ICD-10-CM | POA: Diagnosis not present

## 2016-12-18 DIAGNOSIS — R29898 Other symptoms and signs involving the musculoskeletal system: Secondary | ICD-10-CM

## 2016-12-18 HISTORY — DX: Paresthesia of skin: R20.2

## 2016-12-18 HISTORY — DX: Anemia, unspecified: D64.9

## 2016-12-18 HISTORY — DX: Unspecified osteoarthritis, unspecified site: M19.90

## 2016-12-18 HISTORY — DX: Other specified disorders of kidney and ureter: N28.89

## 2016-12-18 HISTORY — PX: LAMINECTOMY: SHX219

## 2016-12-18 HISTORY — DX: Localized swelling, mass and lump, trunk: R22.2

## 2016-12-18 HISTORY — DX: Other nonspecific abnormal finding of lung field: R91.8

## 2016-12-18 HISTORY — DX: Generalized enlarged lymph nodes: R59.1

## 2016-12-18 LAB — CBC WITH DIFFERENTIAL/PLATELET
BASOS ABS: 0 10*3/uL (ref 0.0–0.1)
BASOS PCT: 0 %
EOS ABS: 0.2 10*3/uL (ref 0.0–0.7)
Eosinophils Relative: 2 %
HCT: 31.6 % — ABNORMAL LOW (ref 36.0–46.0)
HEMOGLOBIN: 10.3 g/dL — AB (ref 12.0–15.0)
LYMPHS ABS: 1 10*3/uL (ref 0.7–4.0)
Lymphocytes Relative: 11 %
MCH: 27.4 pg (ref 26.0–34.0)
MCHC: 32.6 g/dL (ref 30.0–36.0)
MCV: 84 fL (ref 78.0–100.0)
Monocytes Absolute: 1 10*3/uL (ref 0.1–1.0)
Monocytes Relative: 10 %
NEUTROS PCT: 77 %
Neutro Abs: 7.4 10*3/uL (ref 1.7–7.7)
PLATELETS: 365 10*3/uL (ref 150–400)
RBC: 3.76 MIL/uL — AB (ref 3.87–5.11)
RDW: 14.8 % (ref 11.5–15.5)
WBC: 9.6 10*3/uL (ref 4.0–10.5)

## 2016-12-18 LAB — COMPREHENSIVE METABOLIC PANEL
ALT: 26 U/L (ref 14–54)
AST: 30 U/L (ref 15–41)
Albumin: 3.8 g/dL (ref 3.5–5.0)
Alkaline Phosphatase: 66 U/L (ref 38–126)
Anion gap: 10 (ref 5–15)
BILIRUBIN TOTAL: 0.3 mg/dL (ref 0.3–1.2)
BUN: 18 mg/dL (ref 6–20)
CALCIUM: 9.5 mg/dL (ref 8.9–10.3)
CHLORIDE: 99 mmol/L — AB (ref 101–111)
CO2: 27 mmol/L (ref 22–32)
CREATININE: 0.93 mg/dL (ref 0.44–1.00)
Glucose, Bld: 118 mg/dL — ABNORMAL HIGH (ref 65–99)
Potassium: 3.8 mmol/L (ref 3.5–5.1)
Sodium: 136 mmol/L (ref 135–145)
TOTAL PROTEIN: 7.7 g/dL (ref 6.5–8.1)

## 2016-12-18 LAB — TROPONIN I

## 2016-12-18 LAB — URINALYSIS, ROUTINE W REFLEX MICROSCOPIC
Bilirubin Urine: NEGATIVE
GLUCOSE, UA: NEGATIVE mg/dL
HGB URINE DIPSTICK: NEGATIVE
Ketones, ur: NEGATIVE mg/dL
Leukocytes, UA: NEGATIVE
Nitrite: NEGATIVE
PROTEIN: NEGATIVE mg/dL
Specific Gravity, Urine: 1.013 (ref 1.005–1.030)
pH: 6 (ref 5.0–8.0)

## 2016-12-18 LAB — PREPARE RBC (CROSSMATCH)

## 2016-12-18 LAB — LACTIC ACID, PLASMA
LACTIC ACID, VENOUS: 1.3 mmol/L (ref 0.5–1.9)
Lactic Acid, Venous: 1.3 mmol/L (ref 0.5–1.9)

## 2016-12-18 LAB — ABO/RH: ABO/RH(D): O NEG

## 2016-12-18 SURGERY — THORACIC LAMINECTOMY FOR TUMOR
Anesthesia: General | Site: Back

## 2016-12-18 MED ORDER — CEFAZOLIN SODIUM-DEXTROSE 2-3 GM-%(50ML) IV SOLR
INTRAVENOUS | Status: DC | PRN
  Administered 2016-12-18: 2 g via INTRAVENOUS

## 2016-12-18 MED ORDER — BACITRACIN 50000 UNITS IM SOLR
INTRAMUSCULAR | Status: DC | PRN
  Administered 2016-12-18: 500 mL

## 2016-12-18 MED ORDER — SUGAMMADEX SODIUM 200 MG/2ML IV SOLN
INTRAVENOUS | Status: DC | PRN
  Administered 2016-12-18: 200 mg via INTRAVENOUS

## 2016-12-18 MED ORDER — THROMBIN (RECOMBINANT) 20000 UNITS EX SOLR
CUTANEOUS | Status: AC
  Filled 2016-12-18: qty 20000

## 2016-12-18 MED ORDER — LIDOCAINE HCL (CARDIAC) 20 MG/ML IV SOLN
INTRAVENOUS | Status: DC | PRN
  Administered 2016-12-18: 60 mg via INTRAVENOUS

## 2016-12-18 MED ORDER — MORPHINE SULFATE (PF) 4 MG/ML IV SOLN
4.0000 mg | Freq: Once | INTRAVENOUS | Status: AC
  Administered 2016-12-18: 4 mg via INTRAVENOUS
  Filled 2016-12-18: qty 1

## 2016-12-18 MED ORDER — PROPOFOL 10 MG/ML IV BOLUS
INTRAVENOUS | Status: DC | PRN
  Administered 2016-12-18: 100 mg via INTRAVENOUS
  Administered 2016-12-18: 20 mg via INTRAVENOUS

## 2016-12-18 MED ORDER — DEXAMETHASONE SODIUM PHOSPHATE 10 MG/ML IJ SOLN
INTRAMUSCULAR | Status: DC | PRN
  Administered 2016-12-18: 10 mg via INTRAVENOUS

## 2016-12-18 MED ORDER — LIDOCAINE-EPINEPHRINE 1 %-1:100000 IJ SOLN
INTRAMUSCULAR | Status: DC | PRN
  Administered 2016-12-18: 10 mL via INTRADERMAL

## 2016-12-18 MED ORDER — THROMBIN (RECOMBINANT) 20000 UNITS EX SOLR
CUTANEOUS | Status: DC | PRN
  Administered 2016-12-18: 20 mL

## 2016-12-18 MED ORDER — SODIUM CHLORIDE 0.9 % IV SOLN
INTRAVENOUS | Status: DC
  Administered 2016-12-18 – 2016-12-19 (×2): via INTRAVENOUS

## 2016-12-18 MED ORDER — GELATIN ABSORBABLE MT POWD
OROMUCOSAL | Status: DC | PRN
  Administered 2016-12-18: 5 mL

## 2016-12-18 MED ORDER — BACITRACIN ZINC 500 UNIT/GM EX OINT
TOPICAL_OINTMENT | CUTANEOUS | Status: AC
  Filled 2016-12-18: qty 28.35

## 2016-12-18 MED ORDER — SODIUM CHLORIDE 0.9 % IV SOLN
10.0000 mL/h | Freq: Once | INTRAVENOUS | Status: AC
  Administered 2016-12-20: 10 mL/h via INTRAVENOUS

## 2016-12-18 MED ORDER — MIDAZOLAM HCL 5 MG/5ML IJ SOLN
INTRAMUSCULAR | Status: DC | PRN
  Administered 2016-12-18: 2 mg via INTRAVENOUS

## 2016-12-18 MED ORDER — FENTANYL CITRATE (PF) 100 MCG/2ML IJ SOLN
INTRAMUSCULAR | Status: DC | PRN
  Administered 2016-12-18: 100 ug via INTRAVENOUS
  Administered 2016-12-18 (×2): 50 ug via INTRAVENOUS

## 2016-12-18 MED ORDER — SODIUM CHLORIDE 0.9 % IV SOLN: INTRAVENOUS | Status: DC

## 2016-12-18 MED ORDER — MICROFIBRILLAR COLL HEMOSTAT EX PADS
MEDICATED_PAD | CUTANEOUS | Status: DC | PRN
  Administered 2016-12-18: 1 via TOPICAL

## 2016-12-18 MED ORDER — MORPHINE SULFATE (PF) 4 MG/ML IV SOLN
4.0000 mg | INTRAVENOUS | Status: DC | PRN
Start: 2016-12-18 — End: 2016-12-18

## 2016-12-18 MED ORDER — LACTATED RINGERS IV SOLN
INTRAVENOUS | Status: DC | PRN
  Administered 2016-12-18 (×2): via INTRAVENOUS

## 2016-12-18 MED ORDER — LIDOCAINE-EPINEPHRINE 1 %-1:100000 IJ SOLN
INTRAMUSCULAR | Status: AC
  Filled 2016-12-18: qty 1

## 2016-12-18 MED ORDER — HYDROMORPHONE HCL 1 MG/ML IJ SOLN
1.0000 mg | INTRAMUSCULAR | Status: AC | PRN
  Administered 2016-12-18 (×2): 1 mg via INTRAVENOUS
  Filled 2016-12-18 (×2): qty 1

## 2016-12-18 MED ORDER — THROMBIN (RECOMBINANT) 5000 UNITS EX SOLR
CUTANEOUS | Status: AC
  Filled 2016-12-18: qty 5000

## 2016-12-18 MED ORDER — FENTANYL CITRATE (PF) 250 MCG/5ML IJ SOLN
INTRAMUSCULAR | Status: AC
  Filled 2016-12-18: qty 5

## 2016-12-18 MED ORDER — ONDANSETRON HCL 4 MG/2ML IJ SOLN
INTRAMUSCULAR | Status: DC | PRN
  Administered 2016-12-18: 4 mg via INTRAVENOUS

## 2016-12-18 MED ORDER — BUPIVACAINE HCL (PF) 0.25 % IJ SOLN
INTRAMUSCULAR | Status: DC | PRN
  Administered 2016-12-18: 10 mL

## 2016-12-18 MED ORDER — BUPIVACAINE HCL (PF) 0.25 % IJ SOLN
INTRAMUSCULAR | Status: AC
  Filled 2016-12-18: qty 30

## 2016-12-18 MED ORDER — MIDAZOLAM HCL 2 MG/2ML IJ SOLN
INTRAMUSCULAR | Status: AC
  Filled 2016-12-18: qty 2

## 2016-12-18 MED ORDER — ROCURONIUM BROMIDE 100 MG/10ML IV SOLN
INTRAVENOUS | Status: DC | PRN
  Administered 2016-12-18: 70 mg via INTRAVENOUS
  Administered 2016-12-18: 30 mg via INTRAVENOUS

## 2016-12-18 SURGICAL SUPPLY — 73 items
ADH SKN CLS APL DERMABOND .7 (GAUZE/BANDAGES/DRESSINGS) ×1
APL SKNCLS STERI-STRIP NONHPOA (GAUZE/BANDAGES/DRESSINGS) ×1
BAG DECANTER FOR FLEXI CONT (MISCELLANEOUS) ×3 IMPLANT
BENZOIN TINCTURE PRP APPL 2/3 (GAUZE/BANDAGES/DRESSINGS) ×3 IMPLANT
BLADE CLIPPER SURG (BLADE) IMPLANT
BLADE SURG 11 STRL SS (BLADE) ×3 IMPLANT
BUR MATCHSTICK NEURO 3.0 LAGG (BURR) IMPLANT
BUR PRECISION FLUTE 6.0 (BURR) IMPLANT
CANISTER SUCT 3000ML PPV (MISCELLANEOUS) ×3 IMPLANT
CARTRIDGE OIL MAESTRO DRILL (MISCELLANEOUS) ×1 IMPLANT
CLOSURE STERI-STRIP 1/2X4 (GAUZE/BANDAGES/DRESSINGS) ×2
CLOSURE WOUND 1/2 X4 (GAUZE/BANDAGES/DRESSINGS) ×1
CLSR STERI-STRIP ANTIMIC 1/2X4 (GAUZE/BANDAGES/DRESSINGS) ×4 IMPLANT
DECANTER SPIKE VIAL GLASS SM (MISCELLANEOUS) ×3 IMPLANT
DERMABOND ADVANCED (GAUZE/BANDAGES/DRESSINGS) ×2
DERMABOND ADVANCED .7 DNX12 (GAUZE/BANDAGES/DRESSINGS) ×1 IMPLANT
DIFFUSER DRILL AIR PNEUMATIC (MISCELLANEOUS) ×3 IMPLANT
DRAPE LAPAROTOMY 100X72 PEDS (DRAPES) IMPLANT
DRAPE LAPAROTOMY 100X72X124 (DRAPES) ×3 IMPLANT
DRAPE MICROSCOPE LEICA (MISCELLANEOUS) ×3 IMPLANT
DRAPE POUCH INSTRU U-SHP 10X18 (DRAPES) ×3 IMPLANT
DRAPE SURG 17X23 STRL (DRAPES) ×6 IMPLANT
DRSG OPSITE 4X5.5 SM (GAUZE/BANDAGES/DRESSINGS) ×3 IMPLANT
DRSG OPSITE POSTOP 4X10 (GAUZE/BANDAGES/DRESSINGS) ×3 IMPLANT
ELECT REM PT RETURN 9FT ADLT (ELECTROSURGICAL) ×3
ELECTRODE REM PT RTRN 9FT ADLT (ELECTROSURGICAL) ×1 IMPLANT
GAUZE SPONGE 4X4 12PLY STRL (GAUZE/BANDAGES/DRESSINGS) ×3 IMPLANT
GAUZE SPONGE 4X4 16PLY XRAY LF (GAUZE/BANDAGES/DRESSINGS) ×2 IMPLANT
GLOVE BIO SURGEON STRL SZ7 (GLOVE) ×6 IMPLANT
GLOVE BIO SURGEON STRL SZ8 (GLOVE) ×3 IMPLANT
GLOVE BIOGEL PI IND STRL 7.0 (GLOVE) IMPLANT
GLOVE BIOGEL PI IND STRL 7.5 (GLOVE) IMPLANT
GLOVE BIOGEL PI INDICATOR 7.0 (GLOVE)
GLOVE BIOGEL PI INDICATOR 7.5 (GLOVE) ×2
GLOVE EXAM NITRILE LRG STRL (GLOVE) IMPLANT
GLOVE EXAM NITRILE XL STR (GLOVE) IMPLANT
GLOVE EXAM NITRILE XS STR PU (GLOVE) IMPLANT
GLOVE INDICATOR 8.5 STRL (GLOVE) ×3 IMPLANT
GOWN STRL REUS W/ TWL LRG LVL3 (GOWN DISPOSABLE) ×1 IMPLANT
GOWN STRL REUS W/ TWL XL LVL3 (GOWN DISPOSABLE) ×1 IMPLANT
GOWN STRL REUS W/TWL 2XL LVL3 (GOWN DISPOSABLE) ×3 IMPLANT
GOWN STRL REUS W/TWL LRG LVL3 (GOWN DISPOSABLE) ×3
GOWN STRL REUS W/TWL XL LVL3 (GOWN DISPOSABLE) ×3
HEMOSTAT SURGICEL 2X14 (HEMOSTASIS) IMPLANT
KIT BASIN OR (CUSTOM PROCEDURE TRAY) ×3 IMPLANT
KIT ROOM TURNOVER OR (KITS) ×3 IMPLANT
NDL HYPO 25X1 1.5 SAFETY (NEEDLE) ×1 IMPLANT
NEEDLE HYPO 22GX1.5 SAFETY (NEEDLE) ×3 IMPLANT
NEEDLE HYPO 25X1 1.5 SAFETY (NEEDLE) ×3 IMPLANT
NEEDLE SPNL 20GX3.5 QUINCKE YW (NEEDLE) IMPLANT
NS IRRIG 1000ML POUR BTL (IV SOLUTION) ×3 IMPLANT
OIL CARTRIDGE MAESTRO DRILL (MISCELLANEOUS) ×3
PACK LAMINECTOMY NEURO (CUSTOM PROCEDURE TRAY) ×3 IMPLANT
PATTIES SURGICAL .5 X.5 (GAUZE/BANDAGES/DRESSINGS) IMPLANT
PATTIES SURGICAL .5 X3 (DISPOSABLE) ×3 IMPLANT
RUBBERBAND STERILE (MISCELLANEOUS) ×6 IMPLANT
SPONGE LAP 4X18 X RAY DECT (DISPOSABLE) IMPLANT
SPONGE SURGIFOAM ABS GEL 100 (HEMOSTASIS) ×3 IMPLANT
STAPLER VISISTAT 35W (STAPLE) ×3 IMPLANT
STRIP CLOSURE SKIN 1/2X4 (GAUZE/BANDAGES/DRESSINGS) ×2 IMPLANT
SUT BONE WAX W31G (SUTURE) IMPLANT
SUT ETHILON 4 0 PS 2 18 (SUTURE) ×3 IMPLANT
SUT NURALON 4 0 TR CR/8 (SUTURE) ×3 IMPLANT
SUT PROLENE 6 0 BV (SUTURE) IMPLANT
SUT VIC AB 0 CT1 18XCR BRD8 (SUTURE) IMPLANT
SUT VIC AB 0 CT1 8-18 (SUTURE)
SUT VIC AB 2-0 CT1 18 (SUTURE) ×3 IMPLANT
SUT VIC AB 3-0 SH 8-18 (SUTURE) IMPLANT
SUT VICRYL 4-0 PS2 18IN ABS (SUTURE) IMPLANT
TOWEL GREEN STERILE (TOWEL DISPOSABLE) ×3 IMPLANT
TOWEL GREEN STERILE FF (TOWEL DISPOSABLE) ×3 IMPLANT
TRAY FOLEY W/METER SILVER 16FR (SET/KITS/TRAYS/PACK) IMPLANT
WATER STERILE IRR 1000ML POUR (IV SOLUTION) ×3 IMPLANT

## 2016-12-18 NOTE — ED Provider Notes (Signed)
[facility] EMERGENCY DEPARTMENT Provider Note   CSN: [id number] Arrival date & time: 12/18/16  1407     History   Chief Complaint Chief Complaint  Patient presents with  . Weakness    right sided weakness mainly in her legs    HPI Katrina [name] is a 66 y.o. female.  HPI  Pt was seen at 1445. Per pt, c/o gradual onset and worsening of persistent LE's weakness since yesterday. Pt states she was discharged 4 days ago for bilat LE's paresthesias and weakness. Pt was discharged to home with a walker. Pt states yesterday she fell because she "just can't stand" on her LE's. Pt denies any change in her bilat LE's paresthesias since discharge. Pt also c/o continued mid-back "pain" since her previous admission. Denies LOC, no AMS, no neck pain, no CP/SOB, no abd pain, no N/V/D, no fevers, no rash.    Past Medical History:  Diagnosis Date  . Anemia   . Arthritis   . 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 Hypertension   . Lymphadenopathy 11/2016   "extensive"  . Paresthesia of lower extremity 11/2016   bilateral  . Pulmonary nodules 11/2016  . Right renal mass 11/2016    Patient Active Problem List   Diagnosis Date Noted  . Numbness and tingling 12/13/2016  . Numbness and tingling of both lower extremities 12/12/2016  . Hypertension 12/12/2016  . Anemia 12/12/2016  . Abdominal lymphadenopathies 12/12/2016  . Renal mass, left 12/12/2016  . Constipation 12/12/2016  . FH: colon cancer 10/22/2013  . RUQ discomfort 10/22/2013    Past Surgical History:  Procedure Laterality Date  . ABDOMINAL HYSTERECTOMY    . BREAST LUMPECTOMY     left  . COLONOSCOPY N/A 11/02/2013   Procedure: COLONOSCOPY;  Surgeon: Danie Binder, MD;  Location: [facility];  Service: Endoscopy;  Laterality: N/A;  1:15 - moved to [place] notified pt  . SHOULDER SURGERY     right    OB History    No data available       Home Medications    Prior to Admission medications   Medication Sig Start Date End  Date Taking? Authorizing Provider  ibuprofen (ADVIL,MOTRIN) 400 MG tablet Take 1 tablet (400 mg total) by mouth every 8 (eight) hours as needed for moderate pain. 12/14/16   Arrien, Jimmy Picket, MD  MAGNESIUM PO Take 1 tablet by mouth daily.    [provider]  oxyCODONE-acetaminophen (PERCOCET/ROXICET) 5-325 MG tablet Take 1 tablet by mouth every 6 (six) hours as needed for severe pain. 12/14/16   Arrien, Jimmy Picket, MD  triamterene-hydrochlorothiazide (DYAZIDE) 37.5-25 MG capsule Take 1 capsule by mouth daily.    [provider]    Family History Family History  Problem Relation Age of Onset  . Colon cancer Sister        at age 52  . Colon cancer Brother        at age 78  . COPD Mother   . Diabetes Mellitus II Mother   . CAD Mother        Angina  . Colon cancer Father   . Thyroid disease Sister   . Diabetes Mellitus II Sister     Social History Social History  Substance Use Topics  . Smoking status: Never Smoker  . Smokeless tobacco: Never Used  . Alcohol use No     Allergies   Contrast media [iodinated diagnostic agents]   Review of Systems Review of  Systems ROS: Statement: All systems negative except as marked or noted in the HPI; Constitutional: Negative for fever and chills. ; ; Eyes: Negative for eye pain, redness and discharge. ; ; ENMT: Negative for ear pain, hoarseness, nasal congestion, sinus pressure and sore throat. ; ; Cardiovascular: Negative for chest pain, palpitations, diaphoresis, dyspnea and peripheral edema. ; ; Respiratory: Negative for cough, wheezing and stridor. ; ; Gastrointestinal: Negative for nausea, vomiting, diarrhea, abdominal pain, blood in stool, hematemesis, jaundice and rectal bleeding. ; ; Genitourinary: Negative for dysuria, flank pain and hematuria. ; ; Musculoskeletal: Negative for back pain and neck pain. Negative for swelling and trauma.; ; Skin: Negative for pruritus, rash, abrasions, blisters, bruising and  skin lesion.; ; Neuro: +extremity weakness. Negative for headache, lightheadedness and neck stiffness. Negative for altered level of consciousness, altered mental status, involuntary movement, seizure and syncope.       Physical Exam Updated Vital Signs BP (!) 145/75   Pulse 87   Temp 98.9 F (37.2 C) (Oral)   Resp (!) 24   Ht 5\' 8"  (1.727 m)   Wt 116.6 kg (257 lb)   SpO2 97%   BMI 39.08 kg/m    15:35:55 Orthostatic Vital Signs JS  Orthostatic Lying   BP- Lying: 149/77  Pulse- Lying: 83      Orthostatic Sitting  BP- Sitting: 145/79  Pulse- Sitting: 87      Orthostatic Standing at 0 minutes  BP- Standing at 0 minutes:  (Pt says shes unable to stand)     Physical Exam 1450: Physical examination:  Nursing notes reviewed; Vital signs and O2 SAT reviewed;  Constitutional: Well developed, Well nourished, Well hydrated, In no acute distress; Head:  Normocephalic, atraumatic; Eyes: EOMI, PERRL, No scleral icterus; ENMT: Mouth and pharynx normal, Mucous membranes moist; Neck: Supple, Full range of motion, No lymphadenopathy; Cardiovascular: Regular rate and rhythm, No gallop; Respiratory: Breath sounds clear & equal bilaterally, No wheezes.  Speaking full sentences with ease, Normal respiratory effort/excursion; Chest: Nontender, Movement normal; Abdomen: Soft, Nontender, Nondistended, Normal bowel sounds; Genitourinary: No CVA tenderness; Spine:  No midline CS, LS tenderness. +TTP lower TS midline and right paraspinal muscles.;; Extremities: Pulses normal, No tenderness, No edema, No calf edema or asymmetry.; Neuro: AA&Ox3, Major CN grossly intact. No facial droop. Speech clear. Grips equal. Strength 5/5 bilat UE's. Strength 3/5 RLE, 4/5 LLE. Subjective decreased sensation bilat LE's.; Skin: Color normal, Warm, Dry.    ED Treatments / Results  Labs (all labs ordered are listed, but only abnormal results are displayed)   EKG  EKG Interpretation  Date/Time:  Wednesday December 18 2016 14:12:30 EDT Ventricular Rate:  91 PR Interval:    QRS Duration: 151 QT Interval:  397 QTC Calculation: 489 R Axis:   -58 Text Interpretation:  Sinus rhythm RBBB and LAFB Left ventricular hypertrophy Lateral infarct, age indeterminate ST depressions noted laterally. No STEMI.  Confirmed by Nanda Quinton [phone]) on 12/18/2016 2:18:35 PM       Radiology   Procedures Procedures (including critical care time)  Medications Ordered in ED Medications - No data to display   Initial Impression / Assessment and Plan / ED Course  I have reviewed the triage vital signs and the nursing notes.  Pertinent labs & imaging results that were available during my care of the patient were reviewed by me and considered in my medical decision making (see chart for details).  MDM Reviewed: previous chart, nursing note and vitals Reviewed previous: labs and ECG  Interpretation: labs, ECG, x-ray and MRI Total time providing critical care: 30-74 minutes. This excludes time spent performing separately reportable procedures and services. Consults: neurosurgery   CRITICAL CARE Performed by: Alfonzo Feller Total critical care time: 35 minutes Critical care time was exclusive of separately billable procedures and treating other patients. Critical care was necessary to treat or prevent imminent or life-threatening deterioration. Critical care was time spent personally by me on the following activities: development of treatment plan with patient and/or surrogate as well as nursing, discussions with consultants, evaluation of patient's response to treatment, examination of patient, obtaining history from patient or surrogate, ordering and performing treatments and interventions, ordering and review of laboratory studies, ordering and review of radiographic studies, pulse oximetry and re-evaluation of patient's condition.   Results for orders placed or performed during the hospital encounter of  12/18/16  Urinalysis, Routine w reflex microscopic  Result Value Ref Range   Color, Urine YELLOW YELLOW   APPearance CLEAR CLEAR   Specific Gravity, Urine 1.013 1.005 - 1.030   pH 6.0 5.0 - 8.0   Glucose, UA NEGATIVE NEGATIVE mg/dL   Hgb urine dipstick NEGATIVE NEGATIVE   Bilirubin Urine NEGATIVE NEGATIVE   Ketones, ur NEGATIVE NEGATIVE mg/dL   Protein, ur NEGATIVE NEGATIVE mg/dL   Nitrite NEGATIVE NEGATIVE   Leukocytes, UA NEGATIVE NEGATIVE  Comprehensive metabolic panel  Result Value Ref Range   Sodium 136 135 - 145 mmol/L   Potassium 3.8 3.5 - 5.1 mmol/L   Chloride 99 (L) 101 - 111 mmol/L   CO2 27 22 - 32 mmol/L   Glucose, Bld 118 (H) 65 - 99 mg/dL   BUN 18 6 - 20 mg/dL   Creatinine, Ser 0.93 0.44 - 1.00 mg/dL   Calcium 9.5 8.9 - 10.3 mg/dL   Total Protein 7.7 6.5 - 8.1 g/dL   Albumin 3.8 3.5 - 5.0 g/dL   AST 30 15 - 41 U/L   ALT 26 14 - 54 U/L   Alkaline Phosphatase 66 38 - 126 U/L   Total Bilirubin 0.3 0.3 - 1.2 mg/dL   GFR calc non Af Amer >60 >60 mL/min   GFR calc Af Amer >60 >60 mL/min   Anion gap 10 5 - 15  CBC with Differential  Result Value Ref Range   WBC 9.6 4.0 - 10.5 K/uL   RBC 3.76 (L) 3.87 - 5.11 MIL/uL   Hemoglobin 10.3 (L) 12.0 - 15.0 g/dL   HCT 31.6 (L) 36.0 - 46.0 %   MCV 84.0 78.0 - 100.0 fL   MCH 27.4 26.0 - 34.0 pg   MCHC 32.6 30.0 - 36.0 g/dL   RDW 14.8 11.5 - 15.5 %   Platelets 365 150 - 400 K/uL   Neutrophils Relative % 77 %   Neutro Abs 7.4 1.7 - 7.7 K/uL   Lymphocytes Relative 11 %   Lymphs Abs 1.0 0.7 - 4.0 K/uL   Monocytes Relative 10 %   Monocytes Absolute 1.0 0.1 - 1.0 K/uL   Eosinophils Relative 2 %   Eosinophils Absolute 0.2 0.0 - 0.7 K/uL   Basophils Relative 0 %   Basophils Absolute 0.0 0.0 - 0.1 K/uL  Troponin I  Result Value Ref Range   Troponin I <0.03 <0.03 ng/mL  Lactic acid, plasma  Result Value Ref Range   Lactic Acid, Venous 1.3 0.5 - 1.9 mmol/L  Lactic acid, plasma  Result Value Ref Range   Lactic Acid, Venous  1.3 0.5 - 1.9 mmol/L  Dg Chest 2 View Result Date: 12/18/2016 CLINICAL DATA:  Weakness. EXAM: CHEST  2 VIEW COMPARISON:  Chest x-ray dated December 12, 2016. FINDINGS: The cardiomediastinal silhouette is normal in size. Normal pulmonary vascularity. No consolidation, pleural effusion, or pneumothorax. 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 Unchanged fracture of the left lateral fourth rib. IMPRESSION: Unchanged left extrapleural soft tissue mass with destruction of the left fifth rib and nondisplaced fracture of the left lateral fourth rib. Electronically Signed   By: Titus Dubin M.D.   On: 12/18/2016 17:54   Mr Thoracic Spine Limited Wo Contrast Result Date: 12/18/2016 CLINICAL DATA:  Bilateral lower extremity weakness. Status post fall yesterday. The patient has a history of carcinoma of unknown primary. EXAM: MRI THORACIC SPINE WITHOUT CONTRAST TECHNIQUE: Multiplanar, multisequence MR imaging of the thoracic spine was performed. No intravenous contrast was administered. COMPARISON:  CT chest 12/12/2016. FINDINGS: The patient could only tolerate scanning for a T2 weighted sagittal sequence and scout imaging. The T7 vertebral body is replaced with tumor. Extensive epidural tumor is seen eccentric to the right extending cephalad into the T6-7 neural foramen on the right. Tumor also extends posteriorly and compresses the cord. The T7-8 foramen is also filled with tumor. No other evidence of epidural tumor is identified. IMPRESSION: Very limited examination demonstrating compression of the cord from approximately mid T6 to T7-8 by epidural tumor eccentric to the right and surrounding the cord both anteriorly and posteriorly. Tumor fills the right neural foramina at T6-7 and T7-8 and completely replaces the T7 vertebral body. These results were called by telephone at the time of interpretation on 12/18/2016 at 5:14 pm to Dr. Francine Graven , who verbally  acknowledged these results. Electronically Signed   By: Inge Rise M.D.   On: 12/18/2016 17:20    1745:  MRI as above. Pt remedicated for pain.  T/C to [facility] Dr. Saintclair Halsted, case discussed, including:  HPI, pertinent PM/SHx, VS/PE, dx testing, ED course and treatment:  He has viewed the MRI images, requests to send pt to [facility] ED for him to evaluate. Report called to EDP Dr. Venora Maples.     Final Clinical Impressions(s) / ED Diagnoses   Final diagnoses:  None    New Prescriptions New Prescriptions   No medications on file     Francine Graven, DO 12/22/16 1307

## 2016-12-18 NOTE — Progress Notes (Signed)
Palliative Medicine RN Note: Consult order noted. Palliative Care provider is not available at [facility] until Monday 11/5. We will add Ms Begeman to the consult list, and she will be seen then. If urgent recommendations are needed in the interim, please call our office [phone] between the hours of 7am and 7pm.  Betsie Peckman G. Lowery Paullin, RN, BSN, [facility] 12/18/2016 3:45 PM Cell [phone] 8:00-4:00 Monday-Friday Office [phone]

## 2016-12-18 NOTE — ED Notes (Signed)
Dr. Saintclair Halsted in to assess pt

## 2016-12-18 NOTE — Op Note (Signed)
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  EBL: 400  History of present illness: Patient is a 66 year old female has had a week and half a progressive weakness in her legs to where she became nonambulatory 2 days ago. She is expressing numbness from her mid chest down with tingling and weakness in her right foot. Patient was initially admitted to any pen is worked up I had a CT scan of her chest and abdomen and was given a pulmonary diagnosis of lymphoma and set up for a biopsy of the chest wall lesion this Friday however the patient's progressivelygot acutely worse and patient presented earlier. Thoracic MRI showed a large epidural tumor with extension in the right T7 vertebral body. There was severe spinal cord compression. Due to patient's progression of clinical syndrome imaging findings and failure of conservative treatment I recommended decompressive laminectomy from T5-T8 for resection of dorsal epidural mass. I extensively went over the risks and benefits of the operation with the patient as well as perioperative course expectations of outcome alternatives surgery and she understood and agreed to proceed 4.  Operative procedure: Patient brought into the or was induced on general anesthesia positioned prone on flat rolls her back was prepped and draped in routine sterile fashion. Preoperative x-ray localized the appropriate level so after infiltration of 10 mL lidocaine with epi a midline incision was made and Bovie light cautery was used to take down subcutaneous tissue. Subperiosteal dissection was then carried out on the lamina of T5 T6 T7 and T8. Interoperative x-ray confirmed an indication of T7 pedicle and T6 spinous process so I removed the spinous process at T5-T6 and T7 and part of T8  started a central decompressive laminectomy with a high-speed drill and 2 mm Kerrison punch. I marched along the left lateral gutter as this was the area of the least spinal cord compression. I immediately identified dorsal epidural tumor sent and decompressed around it and then removed the tumor and placing and sending to pathology. I then worked along the undersurface of the gutter and curetted out as much tumor as I could from the undersurface of the medial facet complexes at C5-6 T6-7 and T7-T8. Once this significantly decompressed the thoracic cord although I could see tumor extending into the pedicle and in the disc space ventrally that was not able to get 2. After adequate decompression achieved bilaterally the wound scope was irrigated meticulous hemostasis was maintained a medium Hemovac drain was placed and the wounds closed in layers with interrupted Vicryl. Dermabond benzo and Steri-Strips and a sterile dressing was applied and patient recovered in stable condition. At the end the case all needle counts sponge counts were correct.

## 2016-12-18 NOTE — Anesthesia Procedure Notes (Signed)
Procedure Name: Intubation Date/Time: 12/18/2016 9:24 PM Performed by: Manuela Schwartz B Pre-anesthesia Checklist: Patient identified, Emergency Drugs available, Suction available and Patient being monitored Patient Re-evaluated:Patient Re-evaluated prior to induction Oxygen Delivery Method: Circle System Utilized Preoxygenation: Pre-oxygenation with 100% oxygen Induction Type: IV induction Ventilation: Mask ventilation without difficulty and Oral airway inserted - appropriate to patient size Laryngoscope Size: Mac and 3 Grade View: Grade I Tube type: Oral Tube size: 7.5 mm Number of attempts: 1 Airway Equipment and Method: Stylet and Oral airway Placement Confirmation: ETT inserted through vocal cords under direct vision,  positive ETCO2 and breath sounds checked- equal and bilateral Secured at: 22 cm Tube secured with: Tape Dental Injury: Teeth and Oropharynx as per pre-operative assessment

## 2016-12-18 NOTE — ED Notes (Addendum)
Patient arrived with [facility] from [facility] ER transferred here for further evaluation and treatment of her T6-T8 cord compression by epidural tumor seen at MRI today.

## 2016-12-18 NOTE — ED Notes (Signed)
Pt to OR at this time.

## 2016-12-18 NOTE — ED Notes (Signed)
Bladder Scan revealed 335ml urine in bladder. Pt states she needs to void. Purewick catheter in place hooked to suction.

## 2016-12-18 NOTE — Anesthesia Preprocedure Evaluation (Signed)
Anesthesia Evaluation  Patient identified by MRN, date of birth, ID band Patient awake    Reviewed: Allergy & Precautions, NPO status , Patient's Chart, lab work & pertinent test results  History of Anesthesia Complications Negative for: history of anesthetic complications  Airway Mallampati: II  TM Distance: >3 FB Neck ROM: Full    Dental  (+) Missing, Partial Upper   Pulmonary neg pulmonary ROS,    breath sounds clear to auscultation       Cardiovascular hypertension, Pt. on medications  Rhythm:Regular     Neuro/Psych negative neurological ROS  negative psych ROS   GI/Hepatic negative GI ROS, Neg liver ROS,   Endo/Other  negative endocrine ROS  Renal/GU negative Renal ROS     Musculoskeletal  (+) Arthritis ,   Abdominal   Peds  Hematology  (+) anemia ,   Anesthesia Other Findings RBBB and LAFB on EKG. ? Systolic murmur by outside progress note. No echo.  Reproductive/Obstetrics                             Anesthesia Physical Anesthesia Plan  ASA: II and emergent  Anesthesia Plan: General   Post-op Pain Management:    Induction: Intravenous  PONV Risk Score and Plan: 3 and Ondansetron, Dexamethasone and Midazolam  Airway Management Planned: Oral ETT  Additional Equipment: None  Intra-op Plan:   Post-operative Plan: Extubation in OR and Possible Post-op intubation/ventilation  Informed Consent: I have reviewed the patients History and Physical, chart, labs and discussed the procedure including the risks, benefits and alternatives for the proposed anesthesia with the patient or authorized representative who has indicated his/her understanding and acceptance.   Dental advisory given  Plan Discussed with: CRNA and Surgeon  Anesthesia Plan Comments:         Anesthesia Quick Evaluation

## 2016-12-18 NOTE — ED Triage Notes (Signed)
Pt arrived via [facility]. Pt states she fell yesterday at home, but decided to wait to come in the ED to see if her strength would return to her legs.Today she felt worse and called EMS to bring her to the ED. Pt was using a walker at home when she fell.

## 2016-12-18 NOTE — H&P (Signed)
Katrina [name] is an 66 y.o. female.   Chief Complaint: back pain and weakness HPI: 66 year old female who was admitted [facility] last week was worked up for generalized fatigue and weakness and given a presumptive diagnosis for lymphoma after workup revealed a CT scan with multiple areas of lymphadenopathy above and below the diaphragm a renal mass and a chest wall mass. Patient was discharged was ambulatory with a walker upon discharge however since Sunday has had a progressive decline. Last time she was ambulatory was Monday.   since yesterday morning she has had progressive worsening right lower extremity weakness and foot drop that developed yesterday afternoon and increased numbness that has ascended up her lower abdomen.  Past Medical History:  Diagnosis Date  . Anemia   . Arthritis   . 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 Hypertension   . Lymphadenopathy 11/2016   "extensive"  . Paresthesia of lower extremity 11/2016   bilateral  . Pulmonary nodules 11/2016  . Right renal mass 11/2016    Past Surgical History:  Procedure Laterality Date  . ABDOMINAL HYSTERECTOMY    . BREAST LUMPECTOMY     left  . COLONOSCOPY N/A 11/02/2013   Procedure: COLONOSCOPY;  Surgeon: Danie Binder, MD;  Location: [facility];  Service: Endoscopy;  Laterality: N/A;  1:15 - moved to [place] notified pt  . SHOULDER SURGERY     right    Family History  Problem Relation Age of Onset  . Colon cancer Sister        at age 82  . Colon cancer Brother        at age 81  . COPD Mother   . Diabetes Mellitus II Mother   . CAD Mother        Angina  . Colon cancer Father   . Thyroid disease Sister   . Diabetes Mellitus II Sister    Social History:  reports that she has never smoked. She has never used smokeless tobacco. She reports that she does not drink alcohol or use drugs.  Allergies:  Allergies  Allergen Reactions  . Contrast Media [Iodinated Diagnostic Agents] Itching     (Not in a hospital  admission)  Results for orders placed or performed during the hospital encounter of 12/18/16 (from the past 48 hour(s))  Urinalysis, Routine w reflex microscopic     Status: None   Collection Time: 12/18/16  2:52 PM  Result Value Ref Range   Color, Urine YELLOW YELLOW   APPearance CLEAR CLEAR   Specific Gravity, Urine 1.013 1.005 - 1.030   pH 6.0 5.0 - 8.0   Glucose, UA NEGATIVE NEGATIVE mg/dL   Hgb urine dipstick NEGATIVE NEGATIVE   Bilirubin Urine NEGATIVE NEGATIVE   Ketones, ur NEGATIVE NEGATIVE mg/dL   Protein, ur NEGATIVE NEGATIVE mg/dL   Nitrite NEGATIVE NEGATIVE   Leukocytes, UA NEGATIVE NEGATIVE  Comprehensive metabolic panel     Status: Abnormal   Collection Time: 12/18/16  2:58 PM  Result Value Ref Range   Sodium 136 135 - 145 mmol/L   Potassium 3.8 3.5 - 5.1 mmol/L   Chloride 99 (L) 101 - 111 mmol/L   CO2 27 22 - 32 mmol/L   Glucose, Bld 118 (H) 65 - 99 mg/dL   BUN 18 6 - 20 mg/dL   Creatinine, Ser 0.93 0.44 - 1.00 mg/dL   Calcium 9.5 8.9 - 10.3 mg/dL   Total Protein 7.7 6.5 - 8.1 g/dL   Albumin 3.8  3.5 - 5.0 g/dL   AST 30 15 - 41 U/L   ALT 26 14 - 54 U/L   Alkaline Phosphatase 66 38 - 126 U/L   Total Bilirubin 0.3 0.3 - 1.2 mg/dL   GFR calc non Af Amer >60 >60 mL/min   GFR calc Af Amer >60 >60 mL/min    Comment: (NOTE) The eGFR has been calculated using the CKD EPI equation. This calculation has not been validated in all clinical situations. eGFR's persistently <60 mL/min signify possible Chronic Kidney Disease.    Anion gap 10 5 - 15  CBC with Differential     Status: Abnormal   Collection Time: 12/18/16  2:58 PM  Result Value Ref Range   WBC 9.6 4.0 - 10.5 K/uL   RBC 3.76 (L) 3.87 - 5.11 MIL/uL   Hemoglobin 10.3 (L) 12.0 - 15.0 g/dL   HCT 31.6 (L) 36.0 - 46.0 %   MCV 84.0 78.0 - 100.0 fL   MCH 27.4 26.0 - 34.0 pg   MCHC 32.6 30.0 - 36.0 g/dL   RDW 14.8 11.5 - 15.5 %   Platelets 365 150 - 400 K/uL   Neutrophils Relative % 77 %   Neutro Abs 7.4 1.7  - 7.7 K/uL   Lymphocytes Relative 11 %   Lymphs Abs 1.0 0.7 - 4.0 K/uL   Monocytes Relative 10 %   Monocytes Absolute 1.0 0.1 - 1.0 K/uL   Eosinophils Relative 2 %   Eosinophils Absolute 0.2 0.0 - 0.7 K/uL   Basophils Relative 0 %   Basophils Absolute 0.0 0.0 - 0.1 K/uL  Troponin I     Status: None   Collection Time: 12/18/16  2:58 PM  Result Value Ref Range   Troponin I <0.03 <0.03 ng/mL  Lactic acid, plasma     Status: None   Collection Time: 12/18/16  2:58 PM  Result Value Ref Range   Lactic Acid, Venous 1.3 0.5 - 1.9 mmol/L  Lactic acid, plasma     Status: None   Collection Time: 12/18/16  4:52 PM  Result Value Ref Range   Lactic Acid, Venous 1.3 0.5 - 1.9 mmol/L   Dg Chest 2 View  Result Date: 12/18/2016 CLINICAL DATA:  Weakness. EXAM: CHEST  2 VIEW COMPARISON:  Chest x-ray dated December 12, 2016. FINDINGS: The cardiomediastinal silhouette is normal in size. Normal pulmonary vascularity. No consolidation, pleural effusion, or pneumothorax. 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 Unchanged fracture of the left lateral fourth rib. IMPRESSION: Unchanged left extrapleural soft tissue mass with destruction of the left fifth rib and nondisplaced fracture of the left lateral fourth rib. Electronically Signed   By: Titus Dubin M.D.   On: 12/18/2016 17:54   Mr Thoracic Spine Limited Wo Contrast  Result Date: 12/18/2016 CLINICAL DATA:  Bilateral lower extremity weakness. Status post fall yesterday. The patient has a history of carcinoma of unknown primary. EXAM: MRI THORACIC SPINE WITHOUT CONTRAST TECHNIQUE: Multiplanar, multisequence MR imaging of the thoracic spine was performed. No intravenous contrast was administered. COMPARISON:  CT chest 12/12/2016. FINDINGS: The patient could only tolerate scanning for a T2 weighted sagittal sequence and scout imaging. The T7 vertebral body is replaced with tumor. Extensive epidural tumor is seen  eccentric to the right extending cephalad into the T6-7 neural foramen on the right. Tumor also extends posteriorly and compresses the cord. The T7-8 foramen is also filled with tumor. No other evidence of epidural tumor is identified. IMPRESSION:  Very limited examination demonstrating compression of the cord from approximately mid T6 to T7-8 by epidural tumor eccentric to the right and surrounding the cord both anteriorly and posteriorly. Tumor fills the right neural foramina at T6-7 and T7-8 and completely replaces the T7 vertebral body. These results were called by telephone at the time of interpretation on 12/18/2016 at 5:14 pm to Dr. Francine Graven , who verbally acknowledged these results. Electronically Signed   By: Inge Rise M.D.   On: 12/18/2016 17:20    Review of Systems  Musculoskeletal: Positive for back pain, falls and joint pain.  Neurological: Positive for tingling, sensory change and focal weakness.    Blood pressure (!) 162/87, pulse 87, temperature 98.1 F (36.7 C), temperature source Oral, resp. rate 18, height 5' 8"  (1.727 m), weight 115.7 kg (255 lb), SpO2 98 %. Physical Exam  Constitutional: She is oriented to person, place, and time. She appears well-developed and well-nourished.  HENT:  Head: Normocephalic.  Eyes: Pupils are equal, round, and reactive to light.  Neck: Normal range of motion.  Respiratory: Effort normal.  GI: Soft.  Neurological: She is alert and oriented to person, place, and time. GCS eye subscore is 4. GCS verbal subscore is 5. GCS motor subscore is 6.  Patient is awake alert complaining of mid thoracic back pain. She has a T7 sensory level she has 5 out of 5 strength in the left lower extremity iliopsoas, quads, hip she's, gastric, and tibialis, and EHL. She has 4 minus out of 5 strength in the right iliopsoas 4+ out of 5 quads hamstrings she has a complete right foot dropwith one to 2 out of 5 dorsiflexion she does have 4+ out of 5 plantar  flexion the right lower extremity     Assessment/Plan 66 year old female with progressive paraparesis worse on the rightand an MRI scan and CT scan showing a large right sided dorsal and dorsal lateral mass causing severe spinal cord compression. Differential diagnosis includes widely metastatic cancer possibly lymphoma could also possibly renal cell metastases. Due to patient's progression of clinical syndrome over a fairly rapid timetable I recommended decompressive thoracic laminectomy. I've extensively gone over the risks and benefits of that operation with her as well as perioperative course expectations of outcome alternatives of surgery and she understands and agrees to proceed forward. I've also explained that this is not a curative procedure the goal is to decompress her spinal cord.  Tylar Amborn P, MD 12/18/2016, 8:46 PM

## 2016-12-19 ENCOUNTER — Ambulatory Visit (HOSPITAL_COMMUNITY): Payer: MEDICAID

## 2016-12-19 ENCOUNTER — Encounter (HOSPITAL_COMMUNITY): Payer: Self-pay | Admitting: Neurosurgery

## 2016-12-19 DIAGNOSIS — I10 Essential (primary) hypertension: Secondary | ICD-10-CM

## 2016-12-19 DIAGNOSIS — D509 Iron deficiency anemia, unspecified: Secondary | ICD-10-CM | POA: Diagnosis not present

## 2016-12-19 DIAGNOSIS — Z9889 Other specified postprocedural states: Secondary | ICD-10-CM

## 2016-12-19 DIAGNOSIS — N2889 Other specified disorders of kidney and ureter: Secondary | ICD-10-CM | POA: Diagnosis present

## 2016-12-19 DIAGNOSIS — R590 Localized enlarged lymph nodes: Secondary | ICD-10-CM

## 2016-12-19 DIAGNOSIS — G8220 Paraplegia, unspecified: Secondary | ICD-10-CM | POA: Diagnosis present

## 2016-12-19 DIAGNOSIS — D497 Neoplasm of unspecified behavior of endocrine glands and other parts of nervous system: Secondary | ICD-10-CM | POA: Diagnosis not present

## 2016-12-19 DIAGNOSIS — D72828 Other elevated white blood cell count: Secondary | ICD-10-CM | POA: Diagnosis not present

## 2016-12-19 DIAGNOSIS — G9619 Other disorders of meninges, not elsewhere classified: Secondary | ICD-10-CM | POA: Diagnosis not present

## 2016-12-19 DIAGNOSIS — E871 Hypo-osmolality and hyponatremia: Secondary | ICD-10-CM | POA: Diagnosis not present

## 2016-12-19 DIAGNOSIS — M4714 Other spondylosis with myelopathy, thoracic region: Secondary | ICD-10-CM | POA: Diagnosis not present

## 2016-12-19 DIAGNOSIS — C8330 Diffuse large B-cell lymphoma, unspecified site: Secondary | ICD-10-CM | POA: Diagnosis present

## 2016-12-19 DIAGNOSIS — Z4789 Encounter for other orthopedic aftercare: Secondary | ICD-10-CM | POA: Diagnosis not present

## 2016-12-19 DIAGNOSIS — G9520 Unspecified cord compression: Secondary | ICD-10-CM | POA: Diagnosis present

## 2016-12-19 DIAGNOSIS — D72829 Elevated white blood cell count, unspecified: Secondary | ICD-10-CM | POA: Diagnosis not present

## 2016-12-19 DIAGNOSIS — R202 Paresthesia of skin: Secondary | ICD-10-CM | POA: Diagnosis present

## 2016-12-19 DIAGNOSIS — R222 Localized swelling, mass and lump, trunk: Secondary | ICD-10-CM | POA: Diagnosis present

## 2016-12-19 DIAGNOSIS — D649 Anemia, unspecified: Secondary | ICD-10-CM | POA: Diagnosis not present

## 2016-12-19 DIAGNOSIS — D62 Acute posthemorrhagic anemia: Secondary | ICD-10-CM | POA: Diagnosis not present

## 2016-12-19 DIAGNOSIS — N179 Acute kidney failure, unspecified: Secondary | ICD-10-CM | POA: Diagnosis not present

## 2016-12-19 DIAGNOSIS — G96198 Other disorders of meninges, not elsewhere classified: Secondary | ICD-10-CM

## 2016-12-19 DIAGNOSIS — D630 Anemia in neoplastic disease: Secondary | ICD-10-CM | POA: Diagnosis present

## 2016-12-19 DIAGNOSIS — K5900 Constipation, unspecified: Secondary | ICD-10-CM | POA: Diagnosis not present

## 2016-12-19 DIAGNOSIS — Z91041 Radiographic dye allergy status: Secondary | ICD-10-CM | POA: Diagnosis not present

## 2016-12-19 DIAGNOSIS — R918 Other nonspecific abnormal finding of lung field: Secondary | ICD-10-CM | POA: Diagnosis present

## 2016-12-19 LAB — URINALYSIS, ROUTINE W REFLEX MICROSCOPIC
Bilirubin Urine: NEGATIVE
GLUCOSE, UA: NEGATIVE mg/dL
Hgb urine dipstick: NEGATIVE
Ketones, ur: NEGATIVE mg/dL
NITRITE: NEGATIVE
PROTEIN: NEGATIVE mg/dL
Specific Gravity, Urine: 1.021 (ref 1.005–1.030)
TRANS EPITHEL UA: 1
pH: 5 (ref 5.0–8.0)

## 2016-12-19 LAB — RETICULOCYTES
RBC.: 3.17 MIL/uL — AB (ref 3.87–5.11)
RETIC COUNT ABSOLUTE: 117.3 10*3/uL (ref 19.0–186.0)
RETIC CT PCT: 3.7 % — AB (ref 0.4–3.1)

## 2016-12-19 LAB — CBC
HEMATOCRIT: 26.3 % — AB (ref 36.0–46.0)
HEMOGLOBIN: 8.4 g/dL — AB (ref 12.0–15.0)
MCH: 26.5 pg (ref 26.0–34.0)
MCHC: 31.9 g/dL (ref 30.0–36.0)
MCV: 83 fL (ref 78.0–100.0)
PLATELETS: 346 10*3/uL (ref 150–400)
RBC: 3.17 MIL/uL — AB (ref 3.87–5.11)
RDW: 14.6 % (ref 11.5–15.5)
WBC: 13.9 10*3/uL — AB (ref 4.0–10.5)

## 2016-12-19 LAB — FERRITIN: Ferritin: 243 ng/mL (ref 11–307)

## 2016-12-19 LAB — IRON AND TIBC
Iron: 26 ug/dL — ABNORMAL LOW (ref 28–170)
SATURATION RATIOS: 9 % — AB (ref 10.4–31.8)
TIBC: 298 ug/dL (ref 250–450)
UIBC: 272 ug/dL

## 2016-12-19 LAB — VITAMIN B12: VITAMIN B 12: 515 pg/mL (ref 180–914)

## 2016-12-19 LAB — FOLATE: FOLATE: 18 ng/mL (ref >5.9)

## 2016-12-19 LAB — PROTIME-INR
INR: 1.07
PROTHROMBIN TIME: 13.8 s (ref 11.4–15.2)

## 2016-12-19 MED ORDER — TRIAMTERENE-HCTZ 37.5-25 MG PO CAPS
1.0000 | ORAL_CAPSULE | Freq: Every day | ORAL | Status: DC
  Administered 2016-12-19 – 2016-12-26 (×8): 1 via ORAL
  Filled 2016-12-19 (×15): qty 1

## 2016-12-19 MED ORDER — ACETAMINOPHEN 650 MG RE SUPP: 650.0000 mg | RECTAL | Status: DC | PRN

## 2016-12-19 MED ORDER — DOCUSATE SODIUM 100 MG PO CAPS
100.0000 mg | ORAL_CAPSULE | Freq: Every day | ORAL | Status: DC
  Administered 2016-12-19 – 2016-12-26 (×8): 100 mg via ORAL
  Filled 2016-12-19 (×9): qty 1

## 2016-12-19 MED ORDER — HYDROMORPHONE HCL 1 MG/ML IJ SOLN
0.2500 mg | INTRAMUSCULAR | Status: DC | PRN
  Administered 2016-12-19 (×3): 0.5 mg via INTRAVENOUS

## 2016-12-19 MED ORDER — HYDRALAZINE HCL 20 MG/ML IJ SOLN
5.0000 mg | INTRAMUSCULAR | Status: DC | PRN
  Filled 2016-12-19: qty 0.25

## 2016-12-19 MED ORDER — CEFAZOLIN SODIUM-DEXTROSE 2-4 GM/100ML-% IV SOLN
2.0000 g | Freq: Three times a day (TID) | INTRAVENOUS | Status: AC
  Administered 2016-12-19 – 2016-12-20 (×6): 2 g via INTRAVENOUS
  Filled 2016-12-19 (×6): qty 100

## 2016-12-19 MED ORDER — OXYCODONE HCL 5 MG/5ML PO SOLN: 5.0000 mg | Freq: Once | ORAL | Status: DC | PRN

## 2016-12-19 MED ORDER — ONDANSETRON HCL 4 MG PO TABS: 4.0000 mg | ORAL_TABLET | Freq: Four times a day (QID) | ORAL | Status: DC | PRN

## 2016-12-19 MED ORDER — ALUM & MAG HYDROXIDE-SIMETH 200-200-20 MG/5ML PO SUSP: 30.0000 mL | Freq: Four times a day (QID) | ORAL | Status: DC | PRN

## 2016-12-19 MED ORDER — HYDROMORPHONE HCL 1 MG/ML IJ SOLN
INTRAMUSCULAR | Status: AC
  Administered 2016-12-19: 0.5 mg via INTRAVENOUS
  Filled 2016-12-19: qty 1

## 2016-12-19 MED ORDER — ONDANSETRON HCL 4 MG/2ML IJ SOLN
INTRAMUSCULAR | Status: AC
  Administered 2016-12-19: 4 mg via INTRAVENOUS
  Filled 2016-12-19: qty 2

## 2016-12-19 MED ORDER — ONDANSETRON HCL 4 MG/2ML IJ SOLN
4.0000 mg | Freq: Once | INTRAMUSCULAR | Status: AC
  Administered 2016-12-19: 4 mg via INTRAVENOUS

## 2016-12-19 MED ORDER — MENTHOL 3 MG MT LOZG: 1.0000 | LOZENGE | OROMUCOSAL | Status: DC | PRN

## 2016-12-19 MED ORDER — ZOLPIDEM TARTRATE 5 MG PO TABS
5.0000 mg | ORAL_TABLET | Freq: Every evening | ORAL | Status: DC | PRN
  Filled 2016-12-19: qty 1

## 2016-12-19 MED ORDER — OXYCODONE-ACETAMINOPHEN 5-325 MG PO TABS
1.0000 | ORAL_TABLET | Freq: Four times a day (QID) | ORAL | Status: DC | PRN
  Administered 2016-12-19: 1 via ORAL
  Filled 2016-12-19: qty 1

## 2016-12-19 MED ORDER — PANTOPRAZOLE SODIUM 40 MG IV SOLR: 40.0000 mg | Freq: Every day | INTRAVENOUS | Status: DC

## 2016-12-19 MED ORDER — OXYCODONE HCL 5 MG PO TABS
10.0000 mg | ORAL_TABLET | ORAL | Status: DC | PRN
  Administered 2016-12-19 – 2016-12-26 (×18): 10 mg via ORAL
  Filled 2016-12-19 (×18): qty 2

## 2016-12-19 MED ORDER — SODIUM CHLORIDE 0.9% FLUSH
3.0000 mL | Freq: Two times a day (BID) | INTRAVENOUS | Status: DC
  Administered 2016-12-19 – 2016-12-26 (×14): 3 mL via INTRAVENOUS

## 2016-12-19 MED ORDER — PHENOL 1.4 % MT LIQD: 1.0000 | OROMUCOSAL | Status: DC | PRN

## 2016-12-19 MED ORDER — HYDROMORPHONE HCL 1 MG/ML IJ SOLN
INTRAMUSCULAR | Status: AC
  Filled 2016-12-19: qty 1

## 2016-12-19 MED ORDER — OXYCODONE HCL 5 MG PO TABS: 5.0000 mg | ORAL_TABLET | Freq: Once | ORAL | Status: DC | PRN

## 2016-12-19 MED ORDER — PANTOPRAZOLE SODIUM 40 MG PO TBEC
40.0000 mg | DELAYED_RELEASE_TABLET | Freq: Every day | ORAL | Status: DC
  Administered 2016-12-19 – 2016-12-25 (×7): 40 mg via ORAL
  Filled 2016-12-19 (×7): qty 1

## 2016-12-19 MED ORDER — CYCLOBENZAPRINE HCL 10 MG PO TABS
10.0000 mg | ORAL_TABLET | Freq: Three times a day (TID) | ORAL | Status: DC | PRN
  Administered 2016-12-19 – 2016-12-26 (×7): 10 mg via ORAL
  Filled 2016-12-19 (×7): qty 1

## 2016-12-19 MED ORDER — ACETAMINOPHEN 325 MG PO TABS: 650.0000 mg | ORAL_TABLET | ORAL | Status: DC | PRN

## 2016-12-19 MED ORDER — SODIUM CHLORIDE 0.9 % IV SOLN: 250.0000 mL | INTRAVENOUS | Status: DC

## 2016-12-19 MED ORDER — DEXAMETHASONE SODIUM PHOSPHATE 10 MG/ML IJ SOLN
10.0000 mg | Freq: Four times a day (QID) | INTRAMUSCULAR | Status: DC
  Administered 2016-12-19 – 2016-12-21 (×11): 10 mg via INTRAVENOUS
  Filled 2016-12-19 (×11): qty 1

## 2016-12-19 MED ORDER — HYDROMORPHONE HCL 1 MG/ML IJ SOLN
0.5000 mg | INTRAMUSCULAR | Status: DC | PRN
  Administered 2016-12-19: 0.5 mg via INTRAVENOUS
  Filled 2016-12-19: qty 0.5

## 2016-12-19 MED ORDER — SODIUM CHLORIDE 0.9% FLUSH: 3.0000 mL | INTRAVENOUS | Status: DC | PRN

## 2016-12-19 MED ORDER — ONDANSETRON HCL 4 MG/2ML IJ SOLN: 4.0000 mg | Freq: Four times a day (QID) | INTRAMUSCULAR | Status: DC | PRN

## 2016-12-19 NOTE — Progress Notes (Signed)
Pt admitted from PACU on a bed post op back surgery, alert and oriented, c/o of slight pain with a rating of 4, pt settled in room with call bell within pt's reach, pt reassured, will however continue to monitor, v/s stable. Ob[place]-Asidi, Rochel Privett Efe

## 2016-12-19 NOTE — Progress Notes (Signed)
Patient ID: Katrina [name], female   DOB: [birth year], 66 y.o.   MRN: [id number] Patient with significant proven lower extremity function now over antigravity proximally right looks to me and some additional dorsiflexion function. Incision clean dry and intact.  Plan immobilize in today with physical outpatient therapy. I've notified Mont Dutton with radiation oncology to get the patient plugged into the system.

## 2016-12-19 NOTE — Consult Note (Signed)
PROGRESS NOTE  Katrina [name] JQZ:[id number] DOB: Jul 17, 1950 DOA: 12/18/2016 PCP: Royce Macadamia D., PA-C   LOS: 1 day   Brief Narrative / Interim history: 66 year-old female with PMH significant for HTN who was admitted at [facility] from 10/25-10/27 with chest and upper abdominal pain as well as lower extremity paresthesia. At that time, imaging showed extensive abdominal and retroperitoneal adenopathy as well as renal mass and chest wall mass concerning for metastatic disease. Pt was discharged in stable condition with plans for outpatient biopsy on 11/2 and oncology consult to follow.   Starting on Sunday, the pt\'s LE paraesthesia progressed and she developed progressive motor weakness in the legs, particularly on the right. She came back to the ED at [place] where an MRI showed an epidural mass at T6-T8 with significant cord compression. She underwent laminectomy with tumor resection last night. She is currently admitted and neurosurgery has consulted our service for medical management.   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 BP 145/64 this morning. Pt on Dyazide 37.5/25 QD and hydralazine 5 mg PRN for SBP>175. Will continue.  Abdominal lymphadenopathies with chest and renal mass Pt was originally scheduled for bx tomorrow. Epidural tumor sent for bx yesterday, so chest wall bx cancelled. Dr. Cram is connecting pt with oncology services.   Paraparesis Improving since surgery, still with paraesthesias and right LE weakness. Neurosurgery following.  Anemia Hemoglobin 10.3 yesterday. Will recheck CBC today.      DVT prophylaxis: SCDs Code Status: full code Family Communication: not at bedside Disposition Plan: home  Consultants:     Procedures:      Antimicrobials:  Ancef   Subjective: Pt reports feeling tired, but states that sensory/motor deficits are much improved. No CP n/v. Mild dyspnea.   Objective: Vitals:   12/19/16 0159 12/19/16 0200 12/19/16 0520 12/19/16 0957  BP: 130/73 133/70 127/70 (!) 145/64  Pulse: 81 73 79 83  Resp: (!) 22 20 18 20  Temp: 98.2 F (36.8 C) 98.1 F (36.7 C) 98 F (36.7 C) 98.3 F (36.8 C)  TempSrc:  Oral Oral Oral  SpO2: 98% 97% 99% 96%  Weight:  114.1 kg (251 lb 8 oz)    Height:  5\' 8" (1.727 m)      Intake/Output Summary (Last 24 hours) at 12/19/16 1203 Last data filed at 12/19/16 1002  Gross per 24 hour  Intake             2475 ml  Output             1395 ml  Net             10 80 ml   Filed Weights   12/18/16 1416 12/18/16 2001 12/19/16 0200  Weight: 116.6 kg (257 lb) 115.7 kg (255 lb) 114.1 kg (251 lb 8 oz)    Examination:  Constitutional: NAD Eyes: conjunctival pallor ENMT: Mucous membranes are moist.  Neck: normal, supple, no masses Respiratory: clear to auscultation bilaterally, no wheezing, no crackles. Normal respiratory effort. No accessory muscle use.  Cardiovascular: Regular rate and rhythm, no murmurs / rubs / gallops. Trace LE edema. 2+ pedal pulses. Abdomen: no tenderness. Bowel sounds positive.  Musculoskeletal: no clubbing / cyanosis. No joint deformity upper and lower extremities. No contractures.  2/5 motor strength in RLE, 4/5 strength in LLE. RLE with decreased dorsiflexion > plantarflexion.    Skin: no rashes, lesions, ulcers. No induration Neurologic: CN II-XII grossly intact. Sensation intact.  Psychiatric: Normal judgment and insight. Alert and oriented x 3. Normal mood.    Data Reviewed: I have independently reviewed following labs and imaging studies   CBC:  Recent Labs Lab 12/12/16 1217 12/13/16 0450 12/18/16 1458  WBC 8.1 7.4 9.6  NEUTROABS 5.6  --  7.4  HGB 10.5* 9.5* 10.3*  HCT 33.0* 29.0* 31.6*  MCV 83.8 84.5 84.0  PLT 370 323  081   Basic Metabolic Panel:  Recent Labs Lab 12/12/16 1217 12/13/16 0450 12/18/16 1458  NA 135 138 136  K 3.5 3.6 3.8  CL 98* 101 99*  CO2 24 27 27   GLUCOSE 118* 98 118*  BUN 20 23* 18  CREATININE 1.07* 1.12* 0.93  CALCIUM 9.3 8.8* 9.5   GFR: Estimated Creatinine Clearance: 81 mL/min (by C-G formula based on SCr of 0.93 mg/dL). Liver Function Tests:  Recent Labs Lab 12/12/16 1217 12/18/16 1458  AST 28 30  ALT 26 26  ALKPHOS 70 66  BILITOT 0.5 0.3  PROT 7.7 7.7  ALBUMIN 4.0 3.8    Recent Labs Lab 12/12/16 1217  LIPASE 30   No results for input(s): AMMONIA in the last 168 hours. Coagulation Profile:  Recent Labs Lab 12/13/16 1516 12/19/16 0143  INR 1.04 1.07   Cardiac Enzymes:  Recent Labs Lab 12/18/16 1458  TROPONINI <0.03   BNP (last 3 results) No results for input(s): PROBNP in the last 8760 hours. HbA1C: No results for input(s): HGBA1C in the last 72 hours. CBG:  Recent Labs Lab 12/13/16 1610  GLUCAP 143*   Lipid Profile: No results for input(s): CHOL, HDL, LDLCALC, TRIG, CHOLHDL, LDLDIRECT in the last 72 hours. Thyroid Function Tests: No results for input(s): TSH, T4TOTAL, FREET4, T3FREE, THYROIDAB in the last 72 hours. Anemia Panel: No results for input(s): VITAMINB12, FOLATE, FERRITIN, TIBC, IRON, RETICCTPCT in the last 72 hours. Urine analysis:    Component Value Date/Time   COLORURINE YELLOW 12/19/2016 0916   APPEARANCEUR CLOUDY (A) 12/19/2016 0916   LABSPEC 1.021 12/19/2016 0916   PHURINE 5.0 12/19/2016 0916   GLUCOSEU NEGATIVE 12/19/2016 0916   HGBUR NEGATIVE 12/19/2016 0916   BILIRUBINUR NEGATIVE 12/19/2016 0916   KETONESUR NEGATIVE 12/19/2016 0916   PROTEINUR NEGATIVE 12/19/2016 0916   NITRITE NEGATIVE 12/19/2016 0916   LEUKOCYTESUR SMALL (A) 12/19/2016 0916   Sepsis Labs: Invalid input(s): PROCALCITONIN, LACTICIDVEN  No results found for this or any previous visit (from the past 240 hour(s)).    Radiology  Studies: Dg Chest 2 View  Result Date: 12/18/2016 CLINICAL DATA:  Weakness. EXAM: CHEST  2 VIEW COMPARISON:  Chest x-ray dated December 12, 2016. FINDINGS: The cardiomediastinal silhouette is normal in size. Normal pulmonary vascularity. No consolidation, pleural effusion, or pneumothorax. 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 Unchanged fracture of the left lateral fourth rib. IMPRESSION: Unchanged left extrapleural soft tissue mass with destruction of the left fifth rib and nondisplaced fracture of the left lateral fourth rib. Electronically Signed   By: Titus Dubin M.D.   On: 12/18/2016 17:54   Dg Thoracic Spine 2 View  Result Date: 12/19/2016 CLINICAL DATA:  Decompression T5 through T8 thoracic. EXAM: THORACIC SPINE 2 VIEWS COMPARISON:  Limited thoracic spine MRI earlier this day FINDINGS: Four AP views of the chest/thoracic spine obtained during thoracic  surgery. Initial image demonstrates surgical instrument localizing to C7. Second image was surgical instrument localizing to T4-T5. Subsequent images with surgical instruments in place. No hardware visualized on provided images. An endotracheal tube is present. IMPRESSION: Intra procedural surgical localization during thoracic spine surgery. Please reference operative report for details. Electronically Signed   By: Jeb Levering M.D.   On: 12/19/2016 00:50   Mr Thoracic Spine Limited Wo Contrast  Result Date: 12/18/2016 CLINICAL DATA:  Bilateral lower extremity weakness. Status post fall yesterday. The patient has a history of carcinoma of unknown primary. EXAM: MRI THORACIC SPINE WITHOUT CONTRAST TECHNIQUE: Multiplanar, multisequence MR imaging of the thoracic spine was performed. No intravenous contrast was administered. COMPARISON:  CT chest 12/12/2016. FINDINGS: The patient could only tolerate scanning for a T2 weighted sagittal sequence and scout imaging. The T7 vertebral body is replaced with  tumor. Extensive epidural tumor is seen eccentric to the right extending cephalad into the T6-7 neural foramen on the right. Tumor also extends posteriorly and compresses the cord. The T7-8 foramen is also filled with tumor. No other evidence of epidural tumor is identified. IMPRESSION: Very limited examination demonstrating compression of the cord from approximately mid T6 to T7-8 by epidural tumor eccentric to the right and surrounding the cord both anteriorly and posteriorly. Tumor fills the right neural foramina at T6-7 and T7-8 and completely replaces the T7 vertebral body. These results were called by telephone at the time of interpretation on 12/18/2016 at 5:14 pm to Dr. Francine Graven , who verbally acknowledged these results. Electronically Signed   By: Inge Rise M.D.   On: 12/18/2016 17:20     Scheduled Meds: . dexamethasone  10 mg Intravenous Q6H  . docusate sodium  100 mg Oral Daily  . pantoprazole  40 mg Oral QHS  . sodium chloride flush  3 mL Intravenous Q12H  . triamterene-hydrochlorothiazide  1 capsule Oral Daily   Continuous Infusions: . sodium chloride 100 mL/hr at 12/19/16 0210  . sodium chloride    . sodium chloride    .  ceFAZolin (ANCEF) IV Stopped (12/19/16 1002)       Time spent: 15 minutes    Audery Amel, PA-S 12/19/2016, 12:03 PM   @[email or web address] @

## 2016-12-19 NOTE — Care Management Note (Signed)
Case Management Note  Patient Details  Name: Katrina [name] MRN: [id number] Date of Birth: 1950-03-17  Subjective/Objective:    Pt s/p T5-8 decompression d/t tumor. She is from home alone.                 Action/Plan: Awaiting PT/OT recommendations. CM following for d/c needs, physician orders.   Expected Discharge Date:                  Expected Discharge Plan:     In-House Referral:     Discharge planning Services     Post Acute Care Choice:    Choice offered to:     DME Arranged:    DME Agency:     HH Arranged:    HH Agency:     Status of Service:  In process, will continue to follow  If discussed at Long Length of Stay Meetings, dates discussed:    Additional Comments:  Pollie Friar, RN 12/19/2016, 1:17 PM

## 2016-12-19 NOTE — Anesthesia Postprocedure Evaluation (Addendum)
Anesthesia Post Note  Patient: Katrina [name]  Procedure(s) Performed: DECOMPRESSION T5-8/THORACIC (N/A Back)     Patient location during evaluation: PACU Anesthesia Type: General Level of consciousness: awake and alert Pain management: pain level controlled Vital Signs Assessment: post-procedure vital signs reviewed and stable Respiratory status: spontaneous breathing, nonlabored ventilation, respiratory function stable and patient connected to nasal cannula oxygen Cardiovascular status: blood pressure returned to baseline and stable : nausea without vomiting present. Anesthetic complications: no    Last Vitals:  Vitals:   12/19/16 0159 12/19/16 0200  BP: 130/73 133/70  Pulse: 81 73  Resp: (!) 22 20  Temp: 36.8 C 36.7 C  SpO2: 98% 97%    Last Pain:  Vitals:   12/19/16 0200  TempSrc: Oral  PainSc:                  Altha Sweitzer

## 2016-12-19 NOTE — Progress Notes (Signed)
  Aware that patient had back surgery with tumor resection.  She was on our scheduled for tomorrow for a biopsy.  Will cancel this procedure for tomorrow and await pathology.  Please call IR if our services are needed again for this patient.  Parneet Glantz S Leslyn Monda PA-C 12/19/2016 11:46 AM

## 2016-12-19 NOTE — Evaluation (Signed)
Physical Therapy Evaluation Patient Details Name: Katrina [name] MRN: [id number] DOB: 06/15/1950 Today's Date: 12/19/2016   History of Present Illness  Pt is a 66 y/o female s/p T5-T8 decompressive laminectomies and resection of epidural tumor. PMH including but not limited to lymphadenopathy and HTN.  Clinical Impression  Pt presented supine in bed with HOB elevated, awake and willing to participate in therapy session. Prior to admission, pt reported that she was independent with all functional mobility and ADLs. Pt currently requires max A for bed mobility, min-mod A x2 for transfers. Pt only able to perform pivotal side steps to transfer from bed to [facility] and then to chair. Pt with great difficulty moving her R LE. Pt would continue to benefit from skilled physical therapy services at this time while admitted and after d/c to address the below listed limitations in order to improve overall safety and independence with functional mobility.     Follow Up Recommendations SNF;Supervision/Assistance - 24 hour    Equipment Recommendations  None recommended by PT    Recommendations for Other Services       Precautions / Restrictions Precautions Precautions: Fall;Back Precaution Booklet Issued: Yes (comment) Precaution Comments: Reviewed 3/3 back precautions and log roll technique with pt Required Braces or Orthoses: Other Brace/Splint Other Brace/Splint: No brace per MD order Restrictions Weight Bearing Restrictions: No      Mobility  Bed Mobility Overal bed mobility: Needs Assistance Bed Mobility: Rolling;Sidelying to Sit Rolling: Max assist Sidelying to sit: Min assist;+2 for physical assistance       General bed mobility comments: Max A to roll to L side and use of bed rail. Pt able to push up on LUE and required Min A +2 for sidelying to sit  Transfers Overall transfer level: Needs assistance Equipment used: Rolling walker (2 wheeled) Transfers: Sit to/from [employer] Sit to Stand: Min assist;+2 physical assistance Stand pivot transfers: Mod assist;+2 safety/equipment       General transfer comment: Pt requiring Min A +2 to power into standing and then gain balance. Pt requiring Mod A for stand pivot with step by step cues for sequencing.   Ambulation/Gait                Stairs            Wheelchair Mobility    Modified Rankin (Stroke Patients Only)       Balance Overall balance assessment: Needs assistance Sitting-balance support: Feet supported;No upper extremity supported Sitting balance-Leahy Scale: Good Sitting balance - Comments: Maintains sitting balance at EOB   Standing balance support: Bilateral upper extremity supported;During functional activity Standing balance-Leahy Scale: Poor Standing balance comment: Reliant on UE support                             Pertinent Vitals/Pain Pain Assessment: 0-10 Pain Score: 6  Pain Location: Back Pain Descriptors / Indicators: Constant;Discomfort;Grimacing Pain Intervention(s): Monitored during session;Repositioned    Home Living Family/patient expects to be discharged to:: Private residence Living Arrangements: Alone Available Help at Discharge: Family Type of Home: House Home Access: Stairs to enter Entrance Stairs-Rails: Can reach both Entrance Stairs-Number of Steps: 3-4 Home Layout: One level Home Equipment: Bedside commode;Wheelchair - [employer] - 2 wheels;Shower seat Additional Comments: Been staying at her son's house since 12/14/16 (Above information is son's home)    Prior Function Level of Independence: Independent         Comments: Pt  independent, but has been staying at son's house past few days. Son has been assisting with ADLs and IADLs     Hand Dominance   Dominant Hand: Right    Extremity/Trunk Assessment   Upper Extremity Assessment Upper Extremity Assessment: Defer to OT evaluation    Lower Extremity  Assessment Lower Extremity Assessment: Generalized weakness;RLE deficits/detail RLE Deficits / Details: pt with decreased sensation to light touch throughout and decreased strength. MMT revealed 3-/5 for knee flexion, knee extension and ankle DF.    Cervical / Trunk Assessment Cervical / Trunk Assessment: Other exceptions Cervical / Trunk Exceptions: s/p back surgery  Communication   Communication: No difficulties  Cognition Arousal/Alertness: Awake/alert Behavior During Therapy: WFL for tasks assessed/performed;Anxious Overall Cognitive Status: Within Functional Limits for tasks assessed                                 General Comments: patietn is agreeable to participate in therapy and demonstrated active listening and an eagerness to learn, patient with overall pleasant demeanor      General Comments      Exercises     Assessment/Plan    PT Assessment Patient needs continued PT services  PT Problem List Decreased range of motion;Decreased strength;Decreased activity tolerance;Decreased balance;Decreased coordination;Decreased mobility;Decreased knowledge of use of DME;Decreased safety awareness;Decreased knowledge of precautions;Pain       PT Treatment Interventions DME instruction;Gait training;Stair training;Functional mobility training;Balance training;Patient/family education;Therapeutic activities;Therapeutic exercise;Neuromuscular re-education    PT Goals (Current goals can be found in the Care Plan section)  Acute Rehab PT Goals Patient Stated Goal: return to PLOF PT Goal Formulation: With patient Time For Goal Achievement: 01/02/17 Potential to Achieve Goals: Good    Frequency Min 5X/week   Barriers to discharge        Co-evaluation PT/OT/SLP Co-Evaluation/Treatment: Yes Reason for Co-Treatment: To address functional/ADL transfers;For patient/therapist safety PT goals addressed during session: Mobility/safety with mobility;Balance;Proper use  of DME;Strengthening/ROM OT goals addressed during session: ADL's and self-care       AM-PAC PT "6 Clicks" Daily Activity  Outcome Measure Difficulty turning over in bed (including adjusting bedclothes, sheets and blankets)?: Unable Difficulty moving from lying on back to sitting on the side of the bed? : Unable Difficulty sitting down on and standing up from a chair with arms (e.g., wheelchair, bedside commode, etc,.)?: Unable Help needed moving to and from a bed to chair (including a wheelchair)?: A Little Help needed walking in hospital room?: A Lot Help needed climbing 3-5 steps with a railing? : Total 6 Click Score: 9    End of Session Equipment Utilized During Treatment: Gait belt Activity Tolerance: Patient tolerated treatment well Patient left: in chair;with call bell/phone within reach;with chair alarm set Nurse Communication: Mobility status PT Visit Diagnosis: Pain;Other abnormalities of gait and mobility (R26.89) Pain - part of body:  (back)    Time: 5093-2671 PT Time Calculation (min) (ACUTE ONLY): 30 min   Charges:   PT Evaluation $PT Eval Moderate Complexity: 1 Mod     PT G Codes:        Lyndon, PT, DPT [place] 12/19/2016, 3:37 PM

## 2016-12-19 NOTE — Transfer of Care (Signed)
Immediate Anesthesia Transfer of Care Note  Patient: Katrina [name]  Procedure(s) Performed: DECOMPRESSION T5-8/THORACIC (N/A Back)  Patient Location: PACU  Anesthesia Type:General  Level of Consciousness: responds to stimulation  Airway & Oxygen Therapy: Patient Spontanous Breathing  Post-op Assessment: Report given to RN and Post -op Vital signs reviewed and stable  Post vital signs: Reviewed and stable  Last Vitals:  Vitals:   12/18/16 1730 12/18/16 2000  BP: 140/79 (!) 162/87  Pulse: 80 87  Resp: 20 18  Temp:  36.7 C  SpO2: 97% 98%    Last Pain:  Vitals:   12/18/16 2014  TempSrc:   PainSc: 4          Complications: No apparent anesthesia complications

## 2016-12-19 NOTE — Consult Note (Signed)
Medical Consultation   Katrina [name]  MVH:[id number]  DOB: 1950/06/14  DOA: 12/18/2016  PCP: Raiford Simmonds., PA-C   Outpatient Specialists:    Requesting physician: Dr. Saintclair Halsted of neurosurgeon  Reason for consultation: Manager patient's chronic medical issues including hypertension, and to coordinate pt's care including malignancy workup  History of Present Illness:  Katrina [name] is an 66 y.o. female hypertension, anemia,  who is admitted by Dr. Saintclair Halsted of neurosurgeon due to worsening leg weakness and numbness 2/2 to epidural mass.  Pt was recently admitted to [facility] from 10/25-10/27/18 due to lower extremity paresthesias, extensive lymphadenopathy, right renal mass and left chest wall mass. Pt is waiting biopsy for left chest wall mass on Friday. She states that she has worsening weakness and numbness in legs. She states that her numbness is from her mid chest down with tingling and weakness in her right foot. Thoracic MRI showed a large epidural tumor with extension in the right T7 vertebral body. There was severe spinal cord compression. Pt is s/p of decompressive thoracic laminectomy from T5-T8 for resection of epidural tumor by Dr. Saintclair Halsted. She has moderate pain in surgical site. She denies chest pain, shortness breath, cough, fever or chills. No nausea, vomiting, diarrhea, abdominal pain. Patient does not have dysuria or burning on urination, but states that she feels warm of when she urinates.  Review of Systems:   General: no fevers, chills, no changes in body weight, no changes in appetite Skin: no rash HEENT: no blurry vision, hearing changes or sore throat Pulm: no dyspnea, coughing, wheezing CV: no chest pain, palpitations, shortness of breath Abd: no nausea/vomiting, abdominal pain, diarrhea/constipation GU: no dysuria, hematuria, polyuria Ext: no leg edema Musculoskeletal: s/p of s/p of decompressive thoracic laminectomy from T5-T8 for resection of  epidural tumor, has moderate pain in surgical site Neuro: has weakness, numbness in both legs   Past Medical History: Past Medical History:  Diagnosis Date  . Anemia   . Arthritis   . 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 Hypertension   . Lymphadenopathy 11/2016   "extensive"  . Paresthesia of lower extremity 11/2016   bilateral  . Pulmonary nodules 11/2016  . Right renal mass 11/2016    Past Surgical History: Past Surgical History:  Procedure Laterality Date  . ABDOMINAL HYSTERECTOMY    . BREAST LUMPECTOMY     left  . COLONOSCOPY N/A 11/02/2013   Procedure: COLONOSCOPY;  Surgeon: Danie Binder, MD;  Location: [facility];  Service: Endoscopy;  Laterality: N/A;  1:15 - moved to [place] notified pt  . SHOULDER SURGERY     right     Allergies:   Allergies  Allergen Reactions  . Contrast Media [Iodinated Diagnostic Agents] Itching     Social History:  reports that she has never smoked. She has never used smokeless tobacco. She reports that she does not drink alcohol or use drugs.   Family History: Family History  Problem Relation Age of Onset  . Colon cancer Sister        at age 50  . Colon cancer Brother        at age 8  . COPD Mother   . Diabetes Mellitus II Mother   . CAD Mother        Angina  . Colon cancer Father   . Thyroid disease Sister   . Diabetes Mellitus II Sister  Unacceptable: Noncontributory, unremarkable, or negative. Acceptable: Family history reviewed and not pertinent (If you reviewed it)   Physical Exam: Vitals:   12/19/16 0130 12/19/16 0145 12/19/16 0159 12/19/16 0200  BP: 132/75 (!) 81/56 130/73 133/70  Pulse: 81 81 81 73  Resp: 14 20 (!) 22 20  Temp:   98.2 F (36.8 C) 98.1 F (36.7 C)  TempSrc:    Oral  SpO2: 97% 96% 98% 97%  Weight:    114.1 kg (251 lb 8 oz)  Height:    5\' 8"  (1.727 m)     General: Not in acute distress HEENT: PERRL, EOMI, no scleral icterus, No JVD or bruit Cardiac: S1/S2, RRR, No murmurs, gallops  or rubs Pulm: Clear to auscultation bilaterally. No rales, wheezing, rhonchi or rubs. Abd: Soft, nondistended, nontender, no rebound pain, no organomegaly, BS present Ext: No edema. 2+DP/PT pulse bilaterally Musculoskeletal: s/p of s/p of decompressive thoracic laminectomy from T5-T8 for resection of epidural tumor, has moderate pain in surgical site Skin: No rashes.  Neuro: Alert and oriented X3, cranial nerves II-XII grossly intact, muscle strength 4/5 in both legs and has decreased sensation to light touch in legs. Psych: Patient is not psychotic, no suicidal or hemocidal ideation.   Data reviewed:  I have personally reviewed following labs and imaging studies Labs:  CBC:  Recent Labs Lab 12/12/16 1217 12/13/16 0450 12/18/16 1458  WBC 8.1 7.4 9.6  NEUTROABS 5.6  --  7.4  HGB 10.5* 9.5* 10.3*  HCT 33.0* 29.0* 31.6*  MCV 83.8 84.5 84.0  PLT 370 323 948    Basic Metabolic Panel:  Recent Labs Lab 12/12/16 1217 12/13/16 0450 12/18/16 1458  NA 135 138 136  K 3.5 3.6 3.8  CL 98* 101 99*  CO2 24 27 27   GLUCOSE 118* 98 118*  BUN 20 23* 18  CREATININE 1.07* 1.12* 0.93  CALCIUM 9.3 8.8* 9.5   GFR Estimated Creatinine Clearance: 81 mL/min (by C-G formula based on SCr of 0.93 mg/dL). Liver Function Tests:  Recent Labs Lab 12/12/16 1217 12/18/16 1458  AST 28 30  ALT 26 26  ALKPHOS 70 66  BILITOT 0.5 0.3  PROT 7.7 7.7  ALBUMIN 4.0 3.8    Recent Labs Lab 12/12/16 1217  LIPASE 30   No results for input(s): AMMONIA in the last 168 hours. Coagulation profile  Recent Labs Lab 12/13/16 1516 12/19/16 0143  INR 1.04 1.07    Cardiac Enzymes:  Recent Labs Lab 12/18/16 1458  TROPONINI <0.03   BNP: Invalid input(s): POCBNP CBG:  Recent Labs Lab 12/13/16 1610  GLUCAP 143*   D-Dimer No results for input(s): DDIMER in the last 72 hours. Hgb A1c No results for input(s): HGBA1C in the last 72 hours. Lipid Profile No results for input(s): CHOL, HDL,  LDLCALC, TRIG, CHOLHDL, LDLDIRECT in the last 72 hours. Thyroid function studies No results for input(s): TSH, T4TOTAL, T3FREE, THYROIDAB in the last 72 hours.  Invalid input(s): FREET3 Anemia work up No results for input(s): VITAMINB12, FOLATE, FERRITIN, TIBC, IRON, RETICCTPCT in the last 72 hours. Urinalysis    Component Value Date/Time   COLORURINE YELLOW 12/18/2016 [place] 12/18/2016 1452   LABSPEC 1.013 12/18/2016 1452   PHURINE 6.0 12/18/2016 1452   GLUCOSEU NEGATIVE 12/18/2016 1452   HGBUR NEGATIVE 12/18/2016 [place] 12/18/2016 [place] 12/18/2016 [place] 12/18/2016 1452   NITRITE NEGATIVE 12/18/2016 [place] 12/18/2016 1452  Microbiology No results found for this or any previous visit (from the past 240 hour(s)).     Inpatient Medications:   Scheduled Meds: . dexamethasone  10 mg Intravenous Q6H  . docusate sodium  100 mg Oral Daily  . pantoprazole (PROTONIX) IV  40 mg Intravenous QHS  . sodium chloride flush  3 mL Intravenous Q12H  . triamterene-hydrochlorothiazide  1 capsule Oral Daily   Continuous Infusions: . sodium chloride 100 mL/hr at 12/19/16 0210  . sodium chloride    . sodium chloride    .  ceFAZolin (ANCEF) IV 2 g (12/19/16 0257)     Radiological Exams on Admission: Dg Chest 2 View  Result Date: 12/18/2016 CLINICAL DATA:  Weakness. EXAM: CHEST  2 VIEW COMPARISON:  Chest x-ray dated December 12, 2016. FINDINGS: The cardiomediastinal silhouette is normal in size. Normal pulmonary vascularity. No consolidation, pleural effusion, or pneumothorax. 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 Unchanged fracture of the left lateral fourth rib. IMPRESSION: Unchanged left extrapleural soft tissue mass with destruction of the left fifth rib and nondisplaced fracture of the left lateral fourth rib. Electronically Signed   By:  Titus Dubin M.D.   On: 12/18/2016 17:54   Dg Thoracic Spine 2 View  Result Date: 12/19/2016 CLINICAL DATA:  Decompression T5 through T8 thoracic. EXAM: THORACIC SPINE 2 VIEWS COMPARISON:  Limited thoracic spine MRI earlier this day FINDINGS: Four AP views of the chest/thoracic spine obtained during thoracic surgery. Initial image demonstrates surgical instrument localizing to C7. Second image was surgical instrument localizing to T4-T5. Subsequent images with surgical instruments in place. No hardware visualized on provided images. An endotracheal tube is present. IMPRESSION: Intra procedural surgical localization during thoracic spine surgery. Please reference operative report for details. Electronically Signed   By: Jeb Levering M.D.   On: 12/19/2016 00:50   Mr Thoracic Spine Limited Wo Contrast  Result Date: 12/18/2016 CLINICAL DATA:  Bilateral lower extremity weakness. Status post fall yesterday. The patient has a history of carcinoma of unknown primary. EXAM: MRI THORACIC SPINE WITHOUT CONTRAST TECHNIQUE: Multiplanar, multisequence MR imaging of the thoracic spine was performed. No intravenous contrast was administered. COMPARISON:  CT chest 12/12/2016. FINDINGS: The patient could only tolerate scanning for a T2 weighted sagittal sequence and scout imaging. The T7 vertebral body is replaced with tumor. Extensive epidural tumor is seen eccentric to the right extending cephalad into the T6-7 neural foramen on the right. Tumor also extends posteriorly and compresses the cord. The T7-8 foramen is also filled with tumor. No other evidence of epidural tumor is identified. IMPRESSION: Very limited examination demonstrating compression of the cord from approximately mid T6 to T7-8 by epidural tumor eccentric to the right and surrounding the cord both anteriorly and posteriorly. Tumor fills the right neural foramina at T6-7 and T7-8 and completely replaces the T7 vertebral body. These results were  called by telephone at the time of interpretation on 12/18/2016 at 5:14 pm to Dr. Francine Graven , who verbally acknowledged these results. Electronically Signed   By: Inge Rise M.D.   On: 12/18/2016 17:20    Impression/Recommendations Principal Problem:   S/P lumbar laminectomy Active Problems:   Hypertension   Anemia   Abdominal lymphadenopathies   Renal mass, right   Paraparesis (HCC)   Chest wall mass  S/P lumbar laminectomy due to epidural tumor:  -per primary neurosurgery team -pt is on Decadron, when necessary oxycodone and Dilaudid for pain -Patient is also on Ancef -  SW and palliative care consult were ordered by primary team -f/u UA  Hypertension: -continue Dyazide -prn hydralazine  Anemia: hgb 10.3 on admisison. -f/u by CBC  Abdominal lymphadenopathies, right renal mass and chest wall mass: per discharge summary, case was discussed with oncology and interventional radiology. Pt is waiting for biopsy for left chest wall mass, which is scheduled on Friday. The plan is f/u with oncology once results of biopsy become available. I am not sure if the epidural tumor has the same origin with other masses.  -Please call IR for possible biopsy of left chest wall while pt is in hospital.   Thank you for this consultation.  Our [facility] hospitalist team will follow the patient with you.   Time Spent:  36 min  Ivor Costa M.D. [facility] 12/19/2016, 4:00 AM

## 2016-12-19 NOTE — Evaluation (Signed)
Occupational Therapy Evaluation Patient Details Name: Katrina [name] MRN: [id number] DOB: Jul 19, 1950 Today's Date: 12/19/2016    History of Present Illness  Katrina [name] is a 66 y.o. female with medical history significant of hypertension, obesity who comes to the emergency department with complaints of having bilateral lower extremities tingling, numbness and weakness that she first experienced Monday after she sneezed. She subsequently is started experiencing episodes of tingling and numbness on Tuesday when she was coughing. After this, she noticed that it would also get worse with deep inspiration. Since yesterday evening, she has been having persistent numbness and lower extremity weakness with inability to ambulate for long period without holding herself for assistance.    Clinical Impression   PTA, pt was living alone and was independent. Recently, pt has been staying with her son for assistance. Pt currently requiring Max A for LB ADLs, Min-Mod A for toileting, and Mod A +2 for stand pivot with RW. Pt would benefit form further acute OT to facilitate safe dc. Recommend dc to SNF for further OT to increase safety and independence with ADLs and functional mobility before returning home.     Follow Up Recommendations  SNF;Supervision/Assistance - 24 hour    Equipment Recommendations  Other (comment) (Defer to next venue)    Recommendations for Other Services PT consult     Precautions / Restrictions Precautions Precautions: Fall;Back Precaution Booklet Issued: Yes (comment) Precaution Comments: Reviewed back precautions. Required Braces or Orthoses: Other Brace/Splint Other Brace/Splint: No brace per MD Restrictions Weight Bearing Restrictions: No      Mobility Bed Mobility Overal bed mobility: Needs Assistance Bed Mobility: Rolling;Sidelying to Sit Rolling: Max assist Sidelying to sit: Min assist;+2 for physical assistance       General bed mobility comments: Max A  to roll to L side and use of bed rail to pull. Pt able to push up on LUE and required Min A +2 for sidelying to sit  Transfers Overall transfer level: Needs assistance Equipment used: Rolling walker (2 wheeled) Transfers: Sit to/from [facility] Sit to Stand: Min assist;+2 physical assistance Stand pivot transfers: Mod assist;+2 safety/equipment       General transfer comment: Pt requiring Min A +2 to power into standing and then gain balance. Pt requiring Mod A for stand pivot with step by step cues for sequencing.     Balance Overall balance assessment: Needs assistance Sitting-balance support: Feet supported;No upper extremity supported Sitting balance-Leahy Scale: Good Sitting balance - Comments: Maintains sitting balance at EOB   Standing balance support: Bilateral upper extremity supported;During functional activity Standing balance-Leahy Scale: Poor Standing balance comment: Reliant on UE support                           ADL either performed or assessed with clinical judgement   ADL Overall ADL's : Needs assistance/impaired Eating/Feeding: Set up;Sitting   Grooming: Set up;Sitting   Upper Body Bathing: Set up;Sitting   Lower Body Bathing: Maximal assistance;Sit to/from stand   Upper Body Dressing : Set up;Sitting   Lower Body Dressing: Maximal assistance;Sit to/from stand   Toilet Transfer: Moderate assistance;+2 for safety/equipment;BSC;Stand-pivot;RW Toilet Transfer Details (indicate cue type and reason): Mod A +2 to stand pivot to [facility] Pt presenting with decreased balance and poor sensation in  Bil feet Toileting- Clothing Manipulation and Hygiene: Minimal assistance;Sit to/from stand;+2 for safety/equipment Toileting - Clothing Manipulation Details (indicate cue type and reason): Min A to maintain standing  balance     Functional mobility during ADLs: Moderate assistance;+2 for safety/equipment;Rolling walker (Stand pivot  only) General ADL Comments: Pt presenting with decreased functional performance post back surgery. Pt requiring Max A for LB ADLs and Min A for toilet hygiene to maintain balance. Pt verbalizing fear of falling throughout session and benefits from encouragement     Vision Baseline Vision/History: Wears glasses Wears Glasses: Reading only Patient Visual Report: No change from baseline       Perception     Praxis      Pertinent Vitals/Pain Pain Assessment: 0-10 Pain Score: 6  Pain Location: Back Pain Descriptors / Indicators: Constant;Discomfort;Grimacing Pain Intervention(s): Monitored during session;Limited activity within patient's tolerance;Repositioned     Hand Dominance Right   Extremity/Trunk Assessment Upper Extremity Assessment Upper Extremity Assessment: Overall WFL for tasks assessed   Lower Extremity Assessment Lower Extremity Assessment: Defer to PT evaluation   Cervical / Trunk Assessment Cervical / Trunk Assessment: Other exceptions Cervical / Trunk Exceptions: s/p back surgery   Communication Communication Communication: No difficulties   Cognition Arousal/Alertness: Awake/alert Behavior During Therapy: WFL for tasks assessed/performed Overall Cognitive Status: Within Functional Limits for tasks assessed                                 General Comments: patietn is agreeable to participate in therapy and demonstrated active listening and an eagerness to learn, patient with overall pleasant demeanor   General Comments       Exercises     Shoulder Instructions      Home Living Family/patient expects to be discharged to:: Private residence Living Arrangements: Alone Available Help at Discharge: Family Type of Home: House Home Access: Stairs to enter Technical brewer of Steps: 3-4 Entrance Stairs-Rails: Can reach both Home Layout: One level     Bathroom Shower/Tub: Tub/shower unit;Curtain   Biochemist, clinical:  Standard Bathroom Accessibility: Yes   Home Equipment: Bedside commode;Wheelchair - [employer] - 2 wheels;Shower seat   Additional Comments: Been staying at her son's house since 12/14/16 (Above information is son's home)      Prior Functioning/Environment Level of Independence: Independent        Comments: Pt independent, but has been staying at son's house past few days. Son has been assisting with ADLs and IADLs        OT Problem List: Decreased strength;Decreased range of motion;Decreased activity tolerance;Impaired balance (sitting and/or standing);Decreased knowledge of use of DME or AE;Decreased knowledge of precautions;Pain      OT Treatment/Interventions: Self-care/ADL training;Therapeutic exercise;Energy conservation;DME and/or AE instruction;Therapeutic activities;Patient/family education    OT Goals(Current goals can be found in the care plan section) Acute Rehab OT Goals Patient Stated Goal: Get back to PLOF OT Goal Formulation: With patient Time For Goal Achievement: 01/02/17 Potential to Achieve Goals: Good ADL Goals Pt Will Perform Grooming: with set-up;with supervision;standing Pt Will Perform Upper Body Dressing: standing;with modified independence Pt Will Perform Lower Body Dressing: with min guard assist;with adaptive equipment;sit to/from stand Pt Will Transfer to Toilet: with min guard assist;bedside commode;ambulating Pt Will Perform Toileting - Clothing Manipulation and hygiene: with min guard assist;sit to/from stand  OT Frequency: Min 2X/week   Barriers to D/C:            Co-evaluation PT/OT/SLP Co-Evaluation/Treatment: Yes Reason for Co-Treatment: For patient/therapist safety PT goals addressed during session: Mobility/safety with mobility OT goals addressed during session: ADL's and self-care  AM-PAC PT "6 Clicks" Daily Activity     Outcome Measure Help from another person eating meals?: None Help from another person taking care of  personal grooming?: None Help from another person toileting, which includes using toliet, bedpan, or urinal?: A Little Help from another person bathing (including washing, rinsing, drying)?: A Lot Help from another person to put on and taking off regular upper body clothing?: A Little Help from another person to put on and taking off regular lower body clothing?: A Lot 6 Click Score: 18   End of Session Equipment Utilized During Treatment: Gait belt;Rolling walker Nurse Communication: Mobility status  Activity Tolerance: Patient tolerated treatment well;Patient limited by pain;Patient limited by fatigue Patient left: in chair;with call bell/phone within reach;with chair alarm set  OT Visit Diagnosis: Unsteadiness on feet (R26.81);Other abnormalities of gait and mobility (R26.89);Muscle weakness (generalized) (M62.81);Pain;History of falling (Z91.81) Pain - Right/Left:  (Back) Pain - part of body:  (Back)                Time: 8250-0370 OT Time Calculation (min): 28 min Charges:  OT General Charges $OT Visit: 1 Visit OT Evaluation $OT Eval Moderate Complexity: 1 Mod G-Codes:     Siren MSOT, OTR/L Acute Rehab Pager: [phone] Office: [place] 12/19/2016, 1:46 PM

## 2016-12-20 ENCOUNTER — Encounter (HOSPITAL_COMMUNITY): Payer: Self-pay

## 2016-12-20 ENCOUNTER — Inpatient Hospital Stay (HOSPITAL_COMMUNITY)
Admission: RE | Admit: 2016-12-20 | Discharge: 2016-12-20 | Disposition: A | Discharge status: Home / Self Care | Payer: Medicare Other | Source: Ambulatory Visit | Attending: Radiology | Admitting: Radiology

## 2016-12-20 LAB — URINE CULTURE: CULTURE: NO GROWTH

## 2016-12-20 MED ORDER — FERROUS SULFATE 325 (65 FE) MG PO TABS
325.0000 mg | ORAL_TABLET | Freq: Two times a day (BID) | ORAL | Status: DC
  Administered 2016-12-20 – 2016-12-26 (×12): 325 mg via ORAL
  Filled 2016-12-20 (×12): qty 1

## 2016-12-20 NOTE — Progress Notes (Signed)
Attempted to get patient out of bed and ambulate but she is very unstable, put her back in bed she is going to need more help from PT.

## 2016-12-20 NOTE — Progress Notes (Signed)
PROGRESS NOTE  Katrina BLAIZE HUO:[id number] DOB: Jun 27, 1950 DOA: 12/18/2016 PCP: Royce Macadamia D., PA-C   LOS: 2 days   Brief Narrative / Interim history: 66 year-old female with PMH significant for HTN who was admitted at [facility] from 10/25-10/27 with chest and upper abdominal pain as well as lower extremity paresthesia. At that time, imaging showed extensive abdominal and retroperitoneal adenopathy as well as renal mass and chest wall mass concerning for metastatic disease. Pt was discharged in stable condition with plans for outpatient biopsy on 11/2 and oncology consult to follow.   Starting on "Sunday, the pt's LE paraesthesia progressed and she developed progressive motor weakness in the legs, particularly on the right. 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 She underwent laminectomy with tumor resection 10/31. She is currently admitted and neurosurgery has consulted our service for medical management.   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 BP 145/64 this morning. Pt on Dyazide 37.5/25 QD and hydralazine 5 mg PRN for SBP>175. Will continue.  Normocytic anemia Hemoglobin down to 8.4 MCV 83. Fe 26, TIBC 298, ferritin 243. Mild baseline likely 2/2 malignancy, acute decrease likely 2/2 blood loss from surgery. Will start iron supplement.   Abdominal lymphadenopathies with chest and renal mass Dr. Cram is connecting pt with oncology services.   Paraparesis Improving since surgery, still with paraesthesias and right LE weakness. Neurosurgery following. PT/OT consulted and following.    DVT prophylaxis: SCDs Code Status: full code Family Communication: at bedside, all concerns  addressed Disposition Plan: SNF  Consultants:     Procedures:     Antimicrobials:  Ancef   Subjective: Pt is feeling better today, able to ambulate with walker and one assistant. Still with some weakness in the right leg, but much improved. Endorses mild dyspnea, no n/v/d.  Objective: Vitals:   12/19/16 2130 12/20/16 0126 12/20/16 0449 12/20/16 1020  BP: (!) 143/63 140/60 126/65 (!) 154/70  Pulse: 86 88 85 85  Resp: 20 20 20 18  Temp: 98.7 F (37.1 C) 98.5 F (36.9 C) 98.3 F (36.8 C) 98 F (36.7 C)  TempSrc: Oral Oral Oral Oral  SpO2: 95% 96% 95% 96%  Weight:      Height:        Intake/Output Summary (Last 24 hours) at 12/20/16 1246 Last data filed at 12/20/16 0450  Gross per 24 hour  Intake              803 ml  Output              750 ml  Net               53"  ml   Filed Weights   12/18/16 1416 12/18/16 2001 12/19/16 0200  Weight: 116.6 kg (257 lb) 115.7 kg (255 lb) 114.1 kg (251 lb 8 oz)    Examination:  Constitutional: NAD Eyes: lids and conjunctivae normal ENMT: Mucous membranes are moist. Neck: normal, supple, no masses, no thyromegaly Respiratory: clear to auscultation bilaterally, no wheezing, no crackles. Normal respiratory effort. No accessory muscle use.  Cardiovascular: S1, S2, RRR 2/6 systolic murmur. 2+ peripheral pulses, no LE edema.  Abdomen: no tenderness. Bowel sounds  positive.  Musculoskeletal: no clubbing / cyanosis. No joint deformity upper and lower extremities. No contractures. Normal muscle tone.  Skin: no rashes, lesions, ulcers.  Neurologic: Grossly non-focal Psychiatric: Normal judgment and insight. Alert and oriented x 3. Normal mood.    Data Reviewed: I have independently reviewed following labs and imaging studies   CBC:  Recent Labs Lab 12/18/16 1458 12/19/16 1501  WBC 9.6 13.9*  NEUTROABS 7.4  --   HGB 10.3* 8.4*  HCT 31.6* 26.3*  MCV 84.0 83.0  PLT 365 226   Basic Metabolic Panel:  Recent Labs Lab  12/18/16 1458  NA 136  K 3.8  CL 99*  CO2 27  GLUCOSE 118*  BUN 18  CREATININE 0.93  CALCIUM 9.5   GFR: Estimated Creatinine Clearance: 81 mL/min (by C-G formula based on SCr of 0.93 mg/dL). Liver Function Tests:  Recent Labs Lab 12/18/16 1458  AST 30  ALT 26  ALKPHOS 66  BILITOT 0.3  PROT 7.7  ALBUMIN 3.8   No results for input(s): LIPASE, AMYLASE in the last 168 hours. No results for input(s): AMMONIA in the last 168 hours. Coagulation Profile:  Recent Labs Lab 12/13/16 1516 12/19/16 0143  INR 1.04 1.07   Cardiac Enzymes:  Recent Labs Lab 12/18/16 1458  TROPONINI <0.03   BNP (last 3 results) No results for input(s): PROBNP in the last 8760 hours. HbA1C: No results for input(s): HGBA1C in the last 72 hours. CBG:  Recent Labs Lab 12/13/16 1610  GLUCAP 143*   Lipid Profile: No results for input(s): CHOL, HDL, LDLCALC, TRIG, CHOLHDL, LDLDIRECT in the last 72 hours. Thyroid Function Tests: No results for input(s): TSH, T4TOTAL, FREET4, T3FREE, THYROIDAB in the last 72 hours. Anemia Panel:  Recent Labs  12/19/16 1501  VITAMINB12 515  FOLATE 18.0  FERRITIN 243  TIBC 298  IRON 26*  RETICCTPCT 3.7*   Urine analysis:    Component Value Date/Time   COLORURINE YELLOW 12/19/2016 0916   APPEARANCEUR CLOUDY (A) 12/19/2016 0916   LABSPEC 1.021 12/19/2016 0916   PHURINE 5.0 12/19/2016 0916   GLUCOSEU NEGATIVE 12/19/2016 0916   HGBUR NEGATIVE 12/19/2016 0916   BILIRUBINUR NEGATIVE 12/19/2016 0916   KETONESUR NEGATIVE 12/19/2016 0916   PROTEINUR NEGATIVE 12/19/2016 0916   NITRITE NEGATIVE 12/19/2016 0916   LEUKOCYTESUR SMALL (A) 12/19/2016 0916   Sepsis Labs: Invalid input(s): PROCALCITONIN, LACTICIDVEN  Recent Results (from the past 240 hour(s))  Urine culture     Status: None   Collection Time: 12/18/16  2:52 PM  Result Value Ref Range Status   Specimen Description URINE, CATHETERIZED  Final   Special Requests NONE  Final   Culture   Final     NO GROWTH Performed at [facility], [address] [place], [place] [address]    Report Status 12/20/2016 FINAL  Final      Radiology Studies: Dg Chest 2 View  Result Date: 12/18/2016 CLINICAL DATA:  Weakness. EXAM: CHEST  2 VIEW COMPARISON:  Chest x-ray dated December 12, 2016. FINDINGS: The cardiomediastinal silhouette is normal in size. Normal pulmonary vascularity. No consolidation, pleural effusion, or pneumothorax. 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 Unchanged fracture of the left lateral fourth rib. IMPRESSION: Unchanged left extrapleural soft tissue mass with destruction of the left fifth rib and nondisplaced fracture of the left lateral fourth rib. Electronically Signed   By: Titus Dubin M.D.   On: 12/18/2016 17:54   Dg Thoracic Spine 2 View  Result Date: 12/19/2016 CLINICAL DATA:  Decompression T5 through T8 thoracic. EXAM: THORACIC SPINE 2 VIEWS COMPARISON:  Limited thoracic spine MRI earlier this day FINDINGS: Four AP views of the chest/thoracic spine obtained during thoracic surgery. Initial image demonstrates surgical instrument localizing to C7. Second image was surgical instrument localizing to T4-T5. Subsequent images with surgical instruments in place. No hardware visualized on provided images. An endotracheal tube is present. IMPRESSION: Intra procedural surgical localization during thoracic spine surgery. Please reference operative report for details. Electronically Signed   By: Jeb Levering M.D.   On: 12/19/2016 00:50   Mr Thoracic Spine Limited Wo Contrast  Result Date: 12/18/2016 CLINICAL DATA:  Bilateral lower extremity weakness. Status post fall yesterday. The patient has a history of carcinoma of unknown primary. EXAM: MRI THORACIC SPINE WITHOUT CONTRAST TECHNIQUE: Multiplanar, multisequence MR imaging of the thoracic spine was performed. No intravenous contrast was administered. COMPARISON:  CT chest  12/12/2016. FINDINGS: The patient could only tolerate scanning for a T2 weighted sagittal sequence and scout imaging. The T7 vertebral body is replaced with tumor. Extensive epidural tumor is seen eccentric to the right extending cephalad into the T6-7 neural foramen on the right. Tumor also extends posteriorly and compresses the cord. The T7-8 foramen is also filled with tumor. No other evidence of epidural tumor is identified. IMPRESSION: Very limited examination demonstrating compression of the cord from approximately mid T6 to T7-8 by epidural tumor eccentric to the right and surrounding the cord both anteriorly and posteriorly. Tumor fills the right neural foramina at T6-7 and T7-8 and completely replaces the T7 vertebral body. These results were called by telephone at the time of interpretation on 12/18/2016 at 5:14 pm to Dr. Francine Graven , who verbally acknowledged these results. Electronically Signed   By: Inge Rise M.D.   On: 12/18/2016 17:20     Scheduled Meds: . dexamethasone  10 mg Intravenous Q6H  . docusate sodium  100 mg Oral Daily  . pantoprazole  40 mg Oral QHS  . sodium chloride flush  3 mL Intravenous Q12H  . triamterene-hydrochlorothiazide  1 capsule Oral Daily   Continuous Infusions: . sodium chloride    .  ceFAZolin (ANCEF) IV 2 g (12/20/16 1008)       Time spent: 15 minutes    Audery Amel, PA-S 12/20/2016, 12:46 PM   [email or web address] @

## 2016-12-20 NOTE — Progress Notes (Signed)
Per Dr. Quincy Simmonds, hold off on palliative consult. Biopsy pending and plan is to f/u with oncology. Please re-consult Palliative Medicine Team if needed.   NO CHARGE  Ihor Dow, FNP-C Palliative Medicine Team  Phone: [phone] Fax: [phone]

## 2016-12-20 NOTE — Progress Notes (Signed)
Physical Therapy Treatment Patient Details Name: Katrina [name] MRN: [id number] DOB: August 30, 1950 Today's Date: 12/20/2016    History of Present Illness Pt is a 66 y/o female s/p T5-T8 decompressive laminectomies and resection of epidural tumor. PMH including but not limited to lymphadenopathy and HTN.    PT Comments    Patient is making gradual progress toward mobility goals and able to ambulate 42ft with mod A +2. Pt demonstrates ataxic gait and with decreased bilat LE sensation and proprioception. Pt reported improved back pain today. Continue to progress as tolerated with anticipated d/c to SNF for further skilled PT services.    Follow Up Recommendations  SNF;Supervision/Assistance - 24 hour     Equipment Recommendations  None recommended by PT    Recommendations for Other Services       Precautions / Restrictions Precautions Precautions: Fall;Back Precaution Comments: Reviewed 3/3 back precautions and log roll technique with pt Other Brace/Splint: No brace per MD order Restrictions Weight Bearing Restrictions: No    Mobility  Bed Mobility Overal bed mobility: Needs Assistance Bed Mobility: Rolling;Sidelying to Sit Rolling: Min guard Sidelying to sit: Min guard;HOB elevated       General bed mobility comments: pt demonstrated carry over of technique; use of rail; increased time and effort needed; min guard for safety  Transfers Overall transfer level: Needs assistance Equipment used: Rolling walker (2 wheeled) Transfers: Sit to/from [facility] Sit to Stand: Min assist;+2 physical assistance;Mod assist         General transfer comment: min A +2 to power up into standing and mod for balance in standing with RW; cues for posture  Ambulation/Gait Ambulation/Gait assistance: Mod assist;+2 physical assistance Ambulation Distance (Feet): 16 Feet Assistive device: Rolling walker (2 wheeled) Gait Pattern/deviations: Step-through  pattern;Ataxic;Decreased dorsiflexion - right;Decreased dorsiflexion - left     General Gait Details: multimodal cues for posture, sequencing, and bilat foot placement; assist for balance, management of RW   Stairs            Wheelchair Mobility    Modified Rankin (Stroke Patients Only)       Balance Overall balance assessment: Needs assistance Sitting-balance support: Feet supported;No upper extremity supported Sitting balance-Leahy Scale: Good     Standing balance support: Bilateral upper extremity supported;During functional activity Standing balance-Leahy Scale: Poor Standing balance comment: Reliant on UE support                            Cognition Arousal/Alertness: Awake/alert Behavior During Therapy: WFL for tasks assessed/performed;Anxious Overall Cognitive Status: Within Functional Limits for tasks assessed                                        Exercises Other Exercises Other Exercises: pt educated on bilat LE therex for improved proprioception/coordination     General Comments General comments (skin integrity, edema, etc.): pt reported decreased sensation in bilat LE      Pertinent Vitals/Pain Pain Assessment: Faces Faces Pain Scale: Hurts little more Pain Location: Back Pain Descriptors / Indicators: Discomfort;Guarding;Sore Pain Intervention(s): Monitored during session;Premedicated before session;Repositioned    Home Living                      Prior Function            PT Goals (current goals can now be found  in the care plan section) Acute Rehab PT Goals Patient Stated Goal: return to PLOF PT Goal Formulation: With patient Time For Goal Achievement: 01/02/17 Potential to Achieve Goals: Good Progress towards PT goals: Progressing toward goals    Frequency    Min 5X/week      PT Plan Current plan remains appropriate    Co-evaluation              AM-PAC PT "6 Clicks" Daily Activity   Outcome Measure  Difficulty turning over in bed (including adjusting bedclothes, sheets and blankets)?: Unable Difficulty moving from lying on back to sitting on the side of the bed? : Unable Difficulty sitting down on and standing up from a chair with arms (e.g., wheelchair, bedside commode, etc,.)?: Unable Help needed moving to and from a bed to chair (including a wheelchair)?: A Little Help needed walking in hospital room?: A Lot Help needed climbing 3-5 steps with a railing? : Total 6 Click Score: 9    End of Session Equipment Utilized During Treatment: Gait belt Activity Tolerance: Patient tolerated treatment well Patient left: in chair;with call bell/phone within reach;with chair alarm set Nurse Communication: Mobility status PT Visit Diagnosis: Pain;Other abnormalities of gait and mobility (R26.89) Pain - part of body:  (back)     Time: 1120-1150 PT Time Calculation (min) (ACUTE ONLY): 30 min  Charges:  $Gait Training: 8-22 mins $Therapeutic Exercise: 8-22 mins                    G Codes:       Earney Navy, PTA Pager: [phone]     Darliss Cheney 12/20/2016, 2:35 PM

## 2016-12-21 MED ORDER — SENNOSIDES-DOCUSATE SODIUM 8.6-50 MG PO TABS
2.0000 | ORAL_TABLET | Freq: Every evening | ORAL | Status: DC | PRN
  Administered 2016-12-21: 2 via ORAL
  Filled 2016-12-21: qty 2

## 2016-12-21 MED ORDER — DEXAMETHASONE SODIUM PHOSPHATE 10 MG/ML IJ SOLN
10.0000 mg | Freq: Two times a day (BID) | INTRAMUSCULAR | Status: DC
  Administered 2016-12-22 – 2016-12-23 (×3): 10 mg via INTRAVENOUS
  Filled 2016-12-21 (×3): qty 1

## 2016-12-21 MED ORDER — AMLODIPINE BESYLATE 5 MG PO TABS
5.0000 mg | ORAL_TABLET | Freq: Every day | ORAL | Status: DC
  Administered 2016-12-22 – 2016-12-23 (×2): 5 mg via ORAL
  Filled 2016-12-21 (×2): qty 1

## 2016-12-21 NOTE — NC FL2 (Signed)
[place] LEVEL OF CARE SCREENING TOOL     IDENTIFICATION  Patient Name: Katrina [name] Birthdate: [birth year] Sex: female Admission Date (Current Location): 12/18/2016  [facility] and [place] Number:  Herbalist and Address:  [facility] [place], [place] [address], [place],  [address]      Provider Number: [id number]  Attending Physician Name and Address:  Kary Kos, MD  Relative Name and Phone Number:       Current Level of Care: Hospital Recommended Level of Care: [place] Prior Approval Number:    Date Approved/Denied:   PASRR Number:   [id number]  Discharge Plan: SNF    Current Diagnoses: Patient Active Problem List   Diagnosis Date Noted  . Paraparesis ([place]) 12/19/2016  . Chest wall mass 12/19/2016  . 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 S/P lumbar laminectomy 12/18/2016  . Numbness and tingling 12/13/2016  . Numbness and tingling of both lower extremities 12/12/2016  . Hypertension 12/12/2016  . Anemia 12/12/2016  . Abdominal lymphadenopathies 12/12/2016  . Renal mass, right 12/12/2016  . Constipation 12/12/2016  . FH: colon cancer 10/22/2013  . RUQ discomfort 10/22/2013    Orientation RESPIRATION BLADDER Height & Weight     Self, Time, Situation  Normal Continent Weight: 251 lb 8 oz (114.1 kg) Height:  5\' 8"  (172.7 cm)  BEHAVIORAL SYMPTOMS/MOOD NEUROLOGICAL BOWEL NUTRITION STATUS      Continent Diet (please see discharge summary. )  AMBULATORY STATUS COMMUNICATION OF NEEDS Skin   Limited Assist Verbally Surgical wounds                       Personal Care Assistance Level of Assistance  Bathing, Feeding, Dressing Bathing Assistance: Maximum assistance Feeding assistance: Limited assistance Dressing Assistance: Maximum assistance     Functional Limitations Info  Sight, Hearing, Speech Sight Info: Adequate Hearing Info: Adequate Speech Info: Adequate    SPECIAL CARE FACTORS FREQUENCY   PT (By licensed PT), OT (By licensed OT)     PT Frequency: 3 times a week  OT Frequency: 3 times a week             Contractures Contractures Info: Not present    Additional Factors Info  Code Status, Allergies Code Status Info: FUll  Allergies Info: Contrast Media Iodinated Diagnostic Agents           Current Medications (12/21/2016):  This is the current hospital active medication list Current Facility-Administered Medications  Medication Dose Route Frequency Provider Last Rate Last Dose  . 0.9 %  sodium chloride infusion  250 mL Intravenous Continuous Kary Kos, MD      . acetaminophen (TYLENOL) tablet 650 mg  650 mg Oral Q4H PRN Kary Kos, MD       Or  . acetaminophen (TYLENOL) suppository 650 mg  650 mg Rectal Q4H PRN Kary Kos, MD      . alum & mag hydroxide-simeth (MAALOX/MYLANTA) 200-200-20 MG/5ML suspension 30 mL  30 mL Oral Q6H PRN Kary Kos, MD      . cyclobenzaprine (FLEXERIL) tablet 10 mg  10 mg Oral TID PRN Kary Kos, MD   10 mg at 12/20/16 2208  . dexamethasone (DECADRON) injection 10 mg  10 mg Intravenous Q6H Kary Kos, MD   10 mg at 12/21/16 0457  . docusate sodium (COLACE) capsule 100 mg  100 mg Oral Daily Kary Kos, MD   100 mg at 12/21/16 0908  . ferrous  sulfate tablet 325 mg  325 mg Oral BID WC Patrecia Pour, Christean Grief, MD   325 mg at 12/21/16 0908  . hydrALAZINE (APRESOLINE) injection 5 mg  5 mg Intravenous Q2H PRN Ivor Costa, MD      . HYDROmorphone (DILAUDID) injection 0.5 mg  0.5 mg Intravenous Q2H PRN Kary Kos, MD   0.5 mg at 12/19/16 0442  . menthol-cetylpyridinium (CEPACOL) lozenge 3 mg  1 lozenge Oral PRN Kary Kos, MD       Or  . phenol (CHLORASEPTIC) mouth spray 1 spray  1 spray Mouth/Throat PRN Kary Kos, MD      . ondansetron [facility]) tablet 4 mg  4 mg Oral Q6H PRN Kary Kos, MD       Or  . ondansetron [facility]) injection 4 mg  4 mg Intravenous Q6H PRN Kary Kos, MD      . oxyCODONE (Oxy IR/ROXICODONE) immediate release tablet 10 mg   10 mg Oral Q3H PRN Kary Kos, MD   10 mg at 12/21/16 0909  . oxyCODONE-acetaminophen (PERCOCET/ROXICET) 5-325 MG per tablet 1 tablet  1 tablet Oral Q6H PRN Kary Kos, MD   1 tablet at 12/19/16 1002  . pantoprazole (PROTONIX) EC tablet 40 mg  40 mg Oral QHS Kary Kos, MD   40 mg at 12/20/16 2208  . sodium chloride flush (NS) 0.9 % injection 3 mL  3 mL Intravenous Q12H Kary Kos, MD   3 mL at 12/20/16 2210  . sodium chloride flush (NS) 0.9 % injection 3 mL  3 mL Intravenous PRN Kary Kos, MD      . triamterene-hydrochlorothiazide (DYAZIDE) 37.5-25 MG per capsule 1 capsule  1 capsule Oral Daily Kary Kos, MD   1 capsule at 12/21/16 0909  . zolpidem (AMBIEN) tablet 5 mg  5 mg Oral QHS PRN Ivor Costa, MD         Discharge Medications: Please see discharge summary for a list of discharge medications.  Relevant Imaging Results:  Relevant Lab Results:   Additional Information SSN- [id number]  Wetzel Bjornstad, LCSWA

## 2016-12-21 NOTE — Care Management Note (Signed)
Case Management Note  Patient Details  Name: LAILIE SMEAD MRN: [id number] Date of Birth: 1951/01/10  Subjective/Objective:     Pt is a 66 y/o female s/p T5-T8 decompressive laminectomies and resection of epidural tumor. PT has evaluated and is recommending SNF. CM has LM with CSW to follow for plans.                Action/Plan: CM will follow closely for disposition/discharge needs.    Expected Discharge Date:                  Expected Discharge Plan:     In-House Referral:     Discharge planning Services     Post Acute Care Choice:    Choice offered to:     DME Arranged:    DME Agency:     HH Arranged:    HH Agency:     Status of Service:  In process, will continue to follow  If discussed at Long Length of Stay Meetings, dates discussed:    Additional Comments:  Delrae Sawyers, RN 12/21/2016, 8:58 AM

## 2016-12-21 NOTE — Progress Notes (Signed)
Patient is still not able to ambulate out of her room will continue to monitor, note pain is minimal.

## 2016-12-21 NOTE — Clinical Social Work Note (Signed)
Clinical Social Work Assessment  Patient Details  Name: Katrina [name] MRN: [id number] Date of Birth: 06/15/1950  Date of referral:  12/21/16               Reason for consult:  Facility Placement                Permission sought to share information with:  Family Supports Permission granted to share information::  Yes, Verbal Permission Granted  Name::      Ladona Mow  ([phone] pt'as osn)  Agency::     Relationship::     Contact Information:     Housing/Transportation Living arrangements for the past 2 months:  Mobile Home, Single Family Home (pt reported livig alone up until two weeks ago. ) Source of Information:  Patient Patient Interpreter Needed:  None Criminal Activity/Legal Involvement Pertinent to Current Situation/Hospitalization:  No - Comment as needed Significant Relationships:  Adult Children, Other Family Members Lives with:  Adult Children Do you feel safe going back to the place where you live?  Yes Need for family participation in patient care:  Yes (Comment)  Care giving concerns:  CSW spoke with sister at bedside. At this time pt has not expressed any concerns to CSW.    Social Worker assessment / plan:  CSW spoke with pt at bedside. At this time pt is from home with her son. Pt reports that she just recently moved in with son from home alone. Pt has a support system from sister, sister in law and other relatives. Pt is under the assumption that pt is going to CIR, however CSW informed pt that we usually do a back up plan just in case CIR is not abel to meet pt needs. Pt I agreeable to any facility in [place] if not able to go to CIR.   Employment status:  Other (Comment) Insurance information:  Self Pay (Medicaid Pending) PT Recommendations:  [place], 24 Hour Supervision Information / Referral to community resources:  [place]  Patient/Family's Response to care:  Pt is understanding and agreeable to plan of care at this  time.    Patient/Family's Understanding of and Emotional Response to Diagnosis, Current Treatment, and Prognosis:  No further questions or concerns have been presented to CSW at this time.   Emotional Assessment Appearance:  Appears stated age Attitude/Demeanor/Rapport:    Affect (typically observed):  Pleasant Orientation:  Oriented to Situation, Oriented to  Time, Oriented to Place, Oriented to Self Alcohol / Substance use:  Not Applicable Psych involvement (Current and /or in the community):  No (Comment)  Discharge Needs  Concerns to be addressed:    Readmission within the last 30 days:  No Current discharge risk:  None Barriers to Discharge:  No Barriers Identified   Wetzel Bjornstad, [place] 12/21/2016, 10:32 AM

## 2016-12-22 ENCOUNTER — Other Ambulatory Visit: Payer: Self-pay

## 2016-12-22 LAB — TYPE AND SCREEN
ABO/RH(D): O NEG
ANTIBODY SCREEN: NEGATIVE
UNIT DIVISION: 0
UNIT DIVISION: 0

## 2016-12-22 LAB — BPAM RBC
BLOOD PRODUCT EXPIRATION DATE: 201811062359
Blood Product Expiration Date: 201811052359
ISSUE DATE / TIME: 201810282115
ISSUE DATE / TIME: 201811030658
UNIT TYPE AND RH: 9500
Unit Type and Rh: 9500

## 2016-12-22 NOTE — Progress Notes (Signed)
Neurosurgery Progress Note  No issues overnight.  Feels well this morning Strength is improving. Up ambulating  EXAM:  BP (!) 151/73 (BP Location: Left Arm)   Pulse 68   Temp 98.8 F (37.1 C) (Oral)   Resp 18   Ht 5\' 8"  (1.727 m)   Wt 114.1 kg (251 lb 8 oz)   SpO2 99%   BMI 38.24 kg/m   Awake, alert, oriented  Speech fluent, appropriate  CN grossly intact  MAEW   PLAN Progressing nicely Continue to ambulate Dispo planning, CIR vs SNF

## 2016-12-23 MED ORDER — AMLODIPINE BESYLATE 10 MG PO TABS
10.0000 mg | ORAL_TABLET | Freq: Every day | ORAL | Status: DC
  Administered 2016-12-24 – 2016-12-26 (×3): 10 mg via ORAL
  Filled 2016-12-23 (×3): qty 1

## 2016-12-23 MED ORDER — DEXAMETHASONE SODIUM PHOSPHATE 10 MG/ML IJ SOLN
10.0000 mg | INTRAMUSCULAR | Status: DC
  Administered 2016-12-24 – 2016-12-26 (×3): 10 mg via INTRAVENOUS
  Filled 2016-12-23 (×3): qty 1

## 2016-12-23 MED ORDER — AMLODIPINE BESYLATE 10 MG PO TABS: 10.0000 mg | ORAL_TABLET | Freq: Every day | ORAL | Status: DC

## 2016-12-23 NOTE — Progress Notes (Signed)
Occupational Therapy Treatment Patient Details Name: Katrina [name] MRN: [id number] DOB: October 23, 1950 Today's Date: 12/23/2016    History of present illness Pt is a 66 y/o female s/p T5-T8 decompressive laminectomies and resection of epidural tumor. PMH including but not limited to lymphadenopathy and HTN.   OT comments  Pt progressing towards established OT goals. Provided pt with education and handout on AE for LB ADLs. Pt donning underwear and socks with AE and Min A for balance in standing. Pt performing functional mobility with Mod A +2 for safety and to manage RW. Will continue to follow acutely to facilitate safe dc. Continue to recommend dc to SNF for further OT to increase safety and independence with ADLs.    Follow Up Recommendations  SNF;Supervision/Assistance - 24 hour    Equipment Recommendations  Other (comment)(Defer to next venue)    Recommendations for Other Services PT consult    Precautions / Restrictions Precautions Precautions: Fall;Back Precaution Comments: Pt requiring Max cues to recall 3/3 back precautions. Reviewed BLT Other Brace/Splint: No brace per MD order Restrictions Weight Bearing Restrictions: No       Mobility Bed Mobility               General bed mobility comments: up in recliner at entry   Transfers Overall transfer level: Needs assistance Equipment used: Rolling walker (2 wheeled) Transfers: Sit to/from [employer] Transfers Sit to Stand: Mod assist         General transfer comment: modAx1 to power up and to balance in RW, vc for relaxed upright posture in standing    Balance Overall balance assessment: Needs assistance Sitting-balance support: Feet supported;No upper extremity supported Sitting balance-Leahy Scale: Good     Standing balance support: Bilateral upper extremity supported;During functional activity Standing balance-Leahy Scale: Poor Standing balance comment: Reliant on UE support                            ADL either performed or assessed with clinical judgement   ADL Overall ADL's : Needs assistance/impaired               Lower Body Bathing Details (indicate cue type and reason): Educated pt on AE for LB bathing.     Lower Body Dressing: Minimal assistance;Sit to/from stand;+2 for safety/equipment;Cueing for sequencing;With adaptive equipment;Adhering to back precautions Lower Body Dressing Details (indicate cue type and reason): Provided education on AE for LB dressing. Pt donned socks and underwear with Min A for balance in standing.              Functional mobility during ADLs: Moderate assistance;+2 for safety/equipment;Rolling walker General ADL Comments: Providing education and handout on AE for LB ADLs. Pt demonstrating understanding. Pt with BLE ataxia during ADLs and fucntional mobility.      Vision       Perception     Praxis      Cognition Arousal/Alertness: Awake/alert Behavior During Therapy: WFL for tasks assessed/performed;Anxious Overall Cognitive Status: Within Functional Limits for tasks assessed                                          Exercises    Shoulder Instructions       General Comments Pt sister present throughout session    Pertinent Vitals/ Pain       Pain Assessment: Faces Faces  Pain Scale: Hurts a little bit Pain Location: Back Pain Descriptors / Indicators: Discomfort;Guarding;Sore Pain Intervention(s): Monitored during session;Limited activity within patient's tolerance;Repositioned  Home Living                                          Prior Functioning/Environment              Frequency  Min 2X/week        Progress Toward Goals  OT Goals(current goals can now be found in the care plan section)  Progress towards OT goals: Progressing toward goals  Acute Rehab OT Goals Patient Stated Goal: return to PLOF OT Goal Formulation: With patient Time For Goal  Achievement: 01/02/17 Potential to Achieve Goals: Good ADL Goals Pt Will Perform Grooming: with set-up;with supervision;standing Pt Will Perform Upper Body Dressing: standing;with modified independence Pt Will Perform Lower Body Dressing: with min guard assist;with adaptive equipment;sit to/from stand Pt Will Transfer to Toilet: with min guard assist;bedside commode;ambulating Pt Will Perform Toileting - Clothing Manipulation and hygiene: with min guard assist;sit to/from stand  Plan Discharge plan remains appropriate    Co-evaluation    PT/OT/SLP Co-Evaluation/Treatment: Yes Reason for Co-Treatment: For patient/therapist safety PT goals addressed during session: Mobility/safety with mobility OT goals addressed during session: ADL's and self-care      AM-PAC PT "6 Clicks" Daily Activity     Outcome Measure   Help from another person eating meals?: None Help from another person taking care of personal grooming?: None Help from another person toileting, which includes using toliet, bedpan, or urinal?: A Little Help from another person bathing (including washing, rinsing, drying)?: A Lot Help from another person to put on and taking off regular upper body clothing?: A Little Help from another person to put on and taking off regular lower body clothing?: A Little 6 Click Score: 19    End of Session Equipment Utilized During Treatment: Gait belt;Rolling walker  OT Visit Diagnosis: Unsteadiness on feet (R26.81);Other abnormalities of gait and mobility (R26.89);Muscle weakness (generalized) (M62.81);Pain;History of falling (Z91.81) Pain - Right/Left: (Back) Pain - part of body: (Back)   Activity Tolerance Patient tolerated treatment well;Patient limited by pain;Patient limited by fatigue   Patient Left in chair;with call bell/phone within reach;with chair alarm set;with family/visitor present   Nurse Communication Mobility status        Time: 8921-1941 OT Time Calculation  (min): 33 min  Charges: OT General Charges $OT Visit: 1 Visit OT Treatments $Self Care/Home Management : 8-22 mins  [place], OTR/L Acute Rehab Pager: [phone] Office: [place] 12/23/2016, 1:53 PM

## 2016-12-23 NOTE — Progress Notes (Signed)
Physical Therapy Treatment Patient Details Name: Katrina [name] MRN: [id number] DOB: [birth year] Today's Date: 12/23/2016    History of Present Illness Pt is a 66 y/o female s/p T5-T8 decompressive laminectomies and resection of epidural tumor. PMH including but not limited to lymphadenopathy and HTN.    PT Comments    Patient is making steady progress toward mobility goals. Pt currently modAx1 for transfers and modAx2 for ambulation of 20 feet with 1x seated rest break 2' UE fatigue. Pt continues to demonstrates ataxic gait with decreased bilat LE sensation and proprioception.  Continue to progress as tolerated with anticipated d/c to SNF for further skilled PT services.      Follow Up Recommendations  SNF;Supervision/Assistance - 24 hour     Equipment Recommendations  None recommended by PT    Recommendations for Other Services       Precautions / Restrictions Precautions Precautions: Fall;Back Precaution Comments: Reviewed 3/3 back precautions  Other Brace/Splint: No brace per MD order Restrictions Weight Bearing Restrictions: No    Mobility  Bed Mobility               General bed mobility comments: up in recliner at entry   Transfers Overall transfer level: Needs assistance Equipment used: Rolling walker (2 wheeled) Transfers: Sit to/from [employer] Sit to Stand: Mod assist         General transfer comment: modAx1 to power up and to balance in RW, vc for relaxed upright posture in standing  Ambulation/Gait Ambulation/Gait assistance: Mod assist;+2 physical assistance Ambulation Distance (Feet): 20 Feet(2x10') Assistive device: Rolling walker (2 wheeled) Gait Pattern/deviations: Step-through pattern;Ataxic;Decreased dorsiflexion - right;Decreased dorsiflexion - left;Trunk flexed     General Gait Details: minAx2 for gait with modAx2 for steadying with occasional buckling of R>L knee with gait, pt with hip hike and decreased knee flexion  to decrease occurance of knee buckling, vc for increased UE support, upright posture and sequencing of RW and LE          Balance Overall balance assessment: Needs assistance Sitting-balance support: Feet supported;No upper extremity supported Sitting balance-Leahy Scale: Good     Standing balance support: Bilateral upper extremity supported;During functional activity Standing balance-Leahy Scale: Poor Standing balance comment: Reliant on UE support                            Cognition Arousal/Alertness: Awake/alert Behavior During Therapy: WFL for tasks assessed/performed;Anxious Overall Cognitive Status: Within Functional Limits for tasks assessed                                        Exercises General Exercises - Upper Extremity Chair Push Up: AROM;Both;10 reps;Seated    General Comments        Pertinent Vitals/Pain Pain Assessment: Faces Faces Pain Scale: Hurts a little bit Pain Location: Back Pain Descriptors / Indicators: Discomfort;Guarding;Sore           PT Goals (current goals can now be found in the care plan section) Acute Rehab PT Goals Patient Stated Goal: return to PLOF PT Goal Formulation: With patient Time For Goal Achievement: 01/02/17 Potential to Achieve Goals: Good    Frequency    Min 5X/week      PT Plan Current plan remains appropriate    Co-evaluation PT/OT/SLP Co-Evaluation/Treatment: Yes Reason for Co-Treatment: For patient/therapist safety PT goals addressed during session:  Mobility/safety with mobility;Balance;Proper use of DME        AM-PAC PT "6 Clicks" Daily Activity  Outcome Measure  Difficulty turning over in bed (including adjusting bedclothes, sheets and blankets)?: Unable Difficulty moving from lying on back to sitting on the side of the bed? : Unable Difficulty sitting down on and standing up from a chair with arms (e.g., wheelchair, bedside commode, etc,.)?: Unable Help needed  moving to and from a bed to chair (including a wheelchair)?: A Little Help needed walking in hospital room?: A Lot Help needed climbing 3-5 steps with a railing? : Total 6 Click Score: 9    End of Session Equipment Utilized During Treatment: Gait belt Activity Tolerance: Patient tolerated treatment well Patient left: in chair;with call bell/phone within reach;with chair alarm set;with family/visitor present Nurse Communication: Mobility status PT Visit Diagnosis: Pain;Other abnormalities of gait and mobility (R26.89);Unsteadiness on feet (R26.81);Muscle weakness (generalized) (M62.81);Ataxic gait (R26.0) Pain - part of body: (back)     Time: 0925-1000 PT Time Calculation (min) (ACUTE ONLY): 35 min  Charges:  $Gait Training: 8-22 mins                    G Codes:       Kenzlee Fishburn B. Migdalia Dk PT, DPT Acute Rehabilitation  [phone] Pager [phone]     [place] 12/23/2016, 1:28 PM

## 2016-12-23 NOTE — Progress Notes (Signed)
CSW following to facilitate discharge planning. CSW attempting to locate an LOG SNF bed for the patient, due to not having any insurance at this time. CSW discussed case with CSW AD, and obtained approval for LOG placement. CSW contacted Financial Counseling office to determine if patient is eligible for disability; decision will determine whether patient will qualify for 2 weeks versus 30 days of rehabilitation.   CSW to continue to follow for decision. CSW has faxed patient out for LOG placement; patient has no bed offers at this time.  Laveda Abbe, [place] Clinical Social Worker [phone]

## 2016-12-23 NOTE — Progress Notes (Signed)
Patient ID: Katrina [name], female   DOB: 08/08/1950, 66 y.o.   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 This is been discussed and will be reviewed by oncology patient is stable for transfer to rehabilitation whenever bed becomes available.

## 2016-12-24 ENCOUNTER — Encounter: Payer: Self-pay | Admitting: Radiation Therapy

## 2016-12-24 DIAGNOSIS — D509 Iron deficiency anemia, unspecified: Secondary | ICD-10-CM

## 2016-12-24 DIAGNOSIS — K5900 Constipation, unspecified: Secondary | ICD-10-CM

## 2016-12-24 DIAGNOSIS — C8330 Diffuse large B-cell lymphoma, unspecified site: Secondary | ICD-10-CM

## 2016-12-24 DIAGNOSIS — G9520 Unspecified cord compression: Principal | ICD-10-CM

## 2016-12-24 DIAGNOSIS — D72829 Elevated white blood cell count, unspecified: Secondary | ICD-10-CM

## 2016-12-24 NOTE — Progress Notes (Signed)
[facility] CONSULT NOTE  Patient Care Team: Muse, Noel Journey., PA-C as PCP - General  CHIEF COMPLAINTS/PURPOSE OF CONSULTATION:  Newly diagnosed diffuse large B-cell lymphoma  HISTORY OF PRESENTING ILLNESS:  Katrina [name] 66 y.o. female is referred to see me due to recent diagnosis of lymphoma. This otherwise relatively healthy patient had been complaining of chronic back pain for 3-6 months with progressive paresthesia and weakness of lower extremities.  She had recurrent admission to the hospital over the last 2 weeks because of this.  Last week, she had complete loss of power of her lower extremity from the lower part of her chest down and was admitted urgently and underwent surgery.  I have reviewed her records extensively and summarized as follows:   Diffuse large B cell lymphoma ([place])   12/12/2016 Imaging    CT scan showed  1. Extensive mesenteric, retroperitoneal, left hilar, mediastinal, left supraclavicular and left axillary adenopathy. Differential considerations include metastatic adenopathy, lymphoma and leukemia. The size of the nodes in the mesentery and retroperitoneum are suggestive of non-Hodgkin's lymphoma. 2. 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 Differential considerations include metastatic disease and lymphoma. A left upper lobe primary lung carcinoma is a possibility. 3. 3.2 cm solid right renal mass. Differential considerations include renal cell carcinoma and oncocytoma. 4. Small amount of free peritoneal fluid, most likely due to lymphatic obstruction by the mesenteric adenopathy. 5. Left fourth rib fracture. 6. Colonic diverticulosis. 7. Calcific coronary artery and aortic atherosclerosis. Aortic Atherosclerosis (ICD10-I70.0).       12/12/2016 - 12/14/2016 Hospital Admission    He was briefly admitted and evaluated for weakness.  CT imaging showed diffuse disease suspicious for  lymphoma and outpatient evaluation was arranged       12/18/2016 Imaging    MR thoracic spine Very limited examination demonstrating compression of the cord from approximately mid T6 to T7-8 by epidural tumor eccentric to the right and surrounding the cord both anteriorly and posteriorly. Tumor fills the right neural foramina at T6-7 and T7-8 and completely replaces the T7 vertebral body.      12/18/2016 Surgery    She underwent procedure: Decompressive thoracic laminectomy from T5-T8 for resection of epidural tumor        12/18/2016 Pathology Results    1. Soft tissue mass, simple excision, thoracic dorsal epidural mass - DIFFUSE LARGE B-CELL LYMPHOMA. - SEE ONCOLOGY TABLE. 2. Soft tissue mass, simple excision, thoracic dorsal epidural mass - DIFFUSE LARGE B-CELL LYMPHOMA. Microscopic Comment 1. LYMPHOMA Histologic type: Non-Hodgkin B-cell lymphoma: Diffuse large B-cell lymphoma. Grade (if applicable): High grade. Flow cytometry: N/A. Immunohistochemical stains: CD20. CD3, CD5, CD10, CD23, CD30, CD15, PAX-5, Ki-67, bcl-2, bcl-6, CD34. Touch preps/imprints: N/A. Comments: There is a diffuse infiltrate of atypical lymphocytes. The lymphocytes are medium to large in size with irregular nuclear contours. There are background smaller lymphocytes. While there are admixed smaller cells, there are diffuse areas of large cells. The large lymphocytes are positive for CD20, CD10, bcl-6, and bcl-2. CD3 and CD5 highlight admixed T-cells. CD34, CD30, CD23, and CD15 are negative. PAX-5 is positive. Ki-67 is variable with areas up to 70%. 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       12/18/2016 -  Hospital Admission    The patient was readmitted  to the hospital due to complete weakness secondary to spinal cord compression.   She underwent surgery and pathology report confirmed diagnosis of diffuse large B-cell lymphoma.      Since surgery, she is doing better.  She has recovery of some of her lower body sensation and is able to walk 30 feet with physical therapy.  She still have persistent weakness but overall is improving.  The severe back pain has gone away.  She had mild incisional pain.  She have chronic constipation, stable  MEDICAL HISTORY:  Past Medical History:  Diagnosis Date  . Anemia   . Arthritis   . 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 Hypertension   . Lymphadenopathy 11/2016   "extensive"  . Paresthesia of lower extremity 11/2016   bilateral  . Pulmonary nodules 11/2016  . Right renal mass 11/2016    SURGICAL HISTORY: Past Surgical History:  Procedure Laterality Date  . ABDOMINAL HYSTERECTOMY    . BREAST LUMPECTOMY     left  . SHOULDER SURGERY     right    SOCIAL HISTORY: Social History   Socioeconomic History  . Marital status: Widowed    Spouse name: Not on file  . Number of children: Not on file  . Years of education: Not on file  . Highest education level: Not on file  Social Needs  . Financial resource strain: Not on file  . Food insecurity - worry: Not on file  . Food insecurity - inability: Not on file  . Transportation needs - medical: Not on file  . Transportation needs - non-medical: Not on file  Occupational History  . Not on file  Tobacco Use  . Smoking status: Never Smoker  . Smokeless tobacco: Never Used  Substance and Sexual Activity  . Alcohol use: No  . Drug use: No  . Sexual activity: Not on file  Other Topics Concern  . Not on file  Social History Narrative  . Not on file    FAMILY HISTORY: Family History  Problem Relation Age of Onset  . Colon cancer Sister        at age 26  . Colon cancer Brother        at age 5  . COPD Mother   . Diabetes Mellitus II Mother   . CAD Mother        Angina  . Colon cancer Father   . Thyroid disease Sister    . Diabetes Mellitus II Sister     ALLERGIES:  is allergic to contrast media [iodinated diagnostic agents].  MEDICATIONS:  Current Facility-Administered Medications  Medication Dose Route Frequency Provider Last Rate Last Dose  . 0.9 %  sodium chloride infusion  250 mL Intravenous Continuous Kary Kos, MD      . acetaminophen (TYLENOL) tablet 650 mg  650 mg Oral Q4H PRN Kary Kos, MD       Or  . acetaminophen (TYLENOL) suppository 650 mg  650 mg Rectal Q4H PRN Kary Kos, MD      . alum & mag hydroxide-simeth (MAALOX/MYLANTA) 200-200-20 MG/5ML suspension 30 mL  30 mL Oral Q6H PRN Kary Kos, MD      . amLODipine (NORVASC) tablet 10 mg  10 mg Oral Daily Patrecia Pour, Christean Grief, MD   10 mg at 12/24/16 0839  . cyclobenzaprine (FLEXERIL) tablet 10 mg  10 mg Oral TID PRN Kary Kos, MD   10 mg at 12/24/16 1434  . dexamethasone (DECADRON) injection 10 mg  10 mg Intravenous  Q24H Patrecia Pour, Christean Grief, MD   10 mg at 12/24/16 0840  . docusate sodium (COLACE) capsule 100 mg  100 mg Oral Daily Kary Kos, MD   100 mg at 12/24/16 0839  . ferrous sulfate tablet 325 mg  325 mg Oral BID WC Patrecia Pour, Christean Grief, MD   325 mg at 12/24/16 4287  . hydrALAZINE (APRESOLINE) injection 5 mg  5 mg Intravenous Q2H PRN Ivor Costa, MD      . HYDROmorphone (DILAUDID) injection 0.5 mg  0.5 mg Intravenous Q2H PRN Kary Kos, MD   0.5 mg at 12/19/16 0442  . menthol-cetylpyridinium (CEPACOL) lozenge 3 mg  1 lozenge Oral PRN Kary Kos, MD       Or  . phenol (CHLORASEPTIC) mouth spray 1 spray  1 spray Mouth/Throat PRN Kary Kos, MD      . ondansetron [facility]) tablet 4 mg  4 mg Oral Q6H PRN Kary Kos, MD       Or  . ondansetron [facility]) injection 4 mg  4 mg Intravenous Q6H PRN Kary Kos, MD      . oxyCODONE (Oxy IR/ROXICODONE) immediate release tablet 10 mg  10 mg Oral Q3H PRN Kary Kos, MD   10 mg at 12/24/16 1434  . pantoprazole (PROTONIX) EC tablet 40 mg  40 mg Oral QHS Kary Kos, MD   40 mg at 12/23/16 2201  .  senna-docusate (Senokot-S) tablet 2 tablet  2 tablet Oral QHS PRN Newman Pies, MD   2 tablet at 12/21/16 1830  . sodium chloride flush (NS) 0.9 % injection 3 mL  3 mL Intravenous Q12H Kary Kos, MD   3 mL at 12/24/16 0841  . sodium chloride flush (NS) 0.9 % injection 3 mL  3 mL Intravenous PRN Kary Kos, MD      . triamterene-hydrochlorothiazide (DYAZIDE) 37.5-25 MG per capsule 1 capsule  1 capsule Oral Daily Kary Kos, MD   1 capsule at 12/24/16 0839  . zolpidem (AMBIEN) tablet 5 mg  5 mg Oral QHS PRN Ivor Costa, MD        REVIEW OF SYSTEMS:   Constitutional: Denies fevers, chills or abnormal night sweats Eyes: Denies blurriness of vision, double vision or watery eyes Ears, nose, mouth, throat, and face: Denies mucositis or sore throat Respiratory: Denies cough, dyspnea or wheezes Cardiovascular: Denies palpitation, chest discomfort or lower extremity swelling Gastrointestinal:  Denies nausea, heartburn or change in bowel habits Skin: Denies abnormal skin rashes Lymphatics: Denies new lymphadenopathy or easy bruising Behavioral/Psych: Mood is stable, no new changes  All other systems were reviewed with the patient and are negative.  PHYSICAL EXAMINATION: ECOG PERFORMANCE STATUS: 1 - Symptomatic but completely ambulatory  Vitals:   12/24/16 1006 12/24/16 1342  BP: (!) 150/84 (!) 145/87  Pulse: 80 83  Resp: 18 18  Temp: 98.4 F (36.9 C) 98.1 F (36.7 C)  SpO2: 96% 98%   Filed Weights   12/18/16 1416 12/18/16 2001 12/19/16 0200  Weight: 257 lb (116.6 kg) 255 lb (115.7 kg) 251 lb 8 oz (114.1 kg)    GENERAL:alert, no distress and comfortable.  She is moderately obese SKIN: skin color, texture, turgor are normal, no rashes or significant lesions EYES: normal, conjunctiva are pink and non-injected, sclera clear OROPHARYNX:no exudate, no erythema and lips, buccal mucosa, and tongue normal  NECK: supple, thyroid normal size, non-tender, without nodularity LYMPH:  no palpable  lymphadenopathy in the cervical, axillary or inguinal LUNGS: clear to auscultation and percussion with normal breathing effort HEART:  regular rate & rhythm and no murmurs and no lower extremity edema ABDOMEN:abdomen soft, non-tender and normal bowel sounds Musculoskeletal:no cyanosis of digits and no clubbing  PSYCH: alert & oriented x 3 with fluent speech NEURO: She has mild weakness on both lower extremity, right greater than the left.  Gait is not assessed  LABORATORY DATA:  I have reviewed the data as listed Lab Results  Component Value Date   WBC 13.9 (H) 12/19/2016   HGB 8.4 (L) 12/19/2016   HCT 26.3 (L) 12/19/2016   MCV 83.0 12/19/2016   PLT 346 12/19/2016   Recent Labs    12/12/16 1217 12/13/16 0450 12/18/16 1458  NA 135 138 136  K 3.5 3.6 3.8  CL 98* 101 99*  CO2 24 27 27   GLUCOSE 118* 98 118*  BUN 20 23* 18  CREATININE 1.07* 1.12* 0.93  CALCIUM 9.3 8.8* 9.5  GFRNONAA 54* 51* >60  GFRAA >60 59* >60  PROT 7.7  --  7.7  ALBUMIN 4.0  --  3.8  AST 28  --  30  ALT 26  --  26  ALKPHOS 70  --  66  BILITOT 0.5  --  0.3    RADIOGRAPHIC STUDIES: I have personally reviewed the radiological images as listed and agreed with the findings in the report. Dg Chest 2 View  Result Date: 12/18/2016 CLINICAL DATA:  Weakness. EXAM: CHEST  2 VIEW COMPARISON:  Chest x-ray dated December 12, 2016. FINDINGS: The cardiomediastinal silhouette is normal in size. Normal pulmonary vascularity. No consolidation, pleural effusion, or pneumothorax. 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 Unchanged fracture of the left lateral fourth rib. IMPRESSION: Unchanged left extrapleural soft tissue mass with destruction of the left fifth rib and nondisplaced fracture of the left lateral fourth rib. Electronically Signed   By: Titus Dubin M.D.   On: 12/18/2016 17:54   Dg Chest 2 View  Result Date: 12/12/2016 CLINICAL DATA:  Chest and back pain.  Cough  EXAM: CHEST  2 VIEW COMPARISON:  01/09/2010 FINDINGS: Fracture of the left fourth rib laterally. Destruction of the left fifth rib laterally with soft tissue mass. Heart size within normal limits. Negative for heart failure. No pleural effusion. Negative for pneumonia. IMPRESSION: Left extrapleural soft tissue mass with destruction of the left fifth rib and fracture left fourth rib. Findings worrisome for metastatic disease or myeloma. Correlate with any history of trauma. CT chest with contrast recommended for further evaluation. Electronically Signed   By: Franchot Gallo M.D.   On: 12/12/2016 13:05   Dg Thoracic Spine 2 View  Result Date: 12/19/2016 CLINICAL DATA:  Decompression T5 through T8 thoracic. EXAM: THORACIC SPINE 2 VIEWS COMPARISON:  Limited thoracic spine MRI earlier this day FINDINGS: Four AP views of the chest/thoracic spine obtained during thoracic surgery. Initial image demonstrates surgical instrument localizing to C7. Second image was surgical instrument localizing to T4-T5. Subsequent images with surgical instruments in place. No hardware visualized on provided images. An endotracheal tube is present. IMPRESSION: Intra procedural surgical localization during thoracic spine surgery. Please reference operative report for details. Electronically Signed   By: Jeb Levering M.D.   On: 12/19/2016 00:50   Ct Head Wo Contrast  Result Date: 12/12/2016 CLINICAL DATA:  Patient complaining of pain between the shoulder blades for 2 months. EXAM: CT HEAD WITHOUT CONTRAST TECHNIQUE: Contiguous axial images were obtained from the base of the skull through the vertex without intravenous contrast. COMPARISON:  None. FINDINGS:  Brain: No evidence of acute infarction, hemorrhage, hydrocephalus, extra-axial collection or mass lesion/mass effect. Vascular: No hyperdense vessel or unexpected calcification. Skull: Normal. Negative for fracture or focal lesion. Sinuses/Orbits: No acute finding. Other: None.  IMPRESSION: No focal acute intracranial abnormality identified. Electronically Signed   By: Abelardo Diesel M.D.   On: 12/12/2016 21:47   Ct Chest W Contrast  Result Date: 12/12/2016 CLINICAL DATA:  Upper back pain for the past 2 months, increasing. Persistent cough. Epigastric and right upper quadrant abdominal pain. Shooting pain in the upper right thigh. Left extrapleural soft tissue mass with rib destruction on chest radiographs earlier today. EXAM: CT CHEST, ABDOMEN, AND PELVIS WITH CONTRAST TECHNIQUE: Multidetector CT imaging of the chest, abdomen and pelvis was performed following the standard protocol during bolus administration of intravenous contrast. CONTRAST:  18m ISOVUE-300 IOPAMIDOL (ISOVUE-300) INJECTION 61% COMPARISON:  None. Chest radiographs dated 12/12/2016. FINDINGS: CT CHEST FINDINGS Cardiovascular: Normal sized heart. Atheromatous arterial calcifications, including the coronary arteries and aorta. Mediastinum/Nodes: Enlarged AP window and left hilar lymph nodes. An AP window node has a short axis diameter of 14 mm on image number 25 series 2. A left hilar node has a short axis diameter of 21 mm on image number 30 series 2. There are multiple additional enlarged left hilar nodes. No enlarged right hilar nodes. There are 2 enlarged left axillary nodes. The more anterior node has a short axis diameter of 17 mm on image number 17 series 2 and the more posterior node has a short axis diameter 17 mm on image number 30 series 2. There are multiple enlarged left supraclavicular nodes. The largest has a short axis diameter of 15 mm on image number 8 series 2. A left superior mediastinal node has a short axis diameter of 10 mm on image number 14 series 2. Normal appearing thyroid gland. Lungs/Pleura: There are multiple left upper lobe nodules. The majority are adjacent to a left chest wall mass. The largest of these has a maximum diameter of 1.7 cm on image number 58 series 4. A parenchymal nodule  has a diameter 1.0 cm on image number 58 series for and an additional adjacent parenchymal nodule has a diameter of 1.0 cm on image number 60 series 4. More centrally in the left upper lobe, there are 2 adjacent nodules. One measures 2.0 cm on image number 56 series 4 and the other measures 1.4 cm on image number 57 series 4. There are mild bronchiectatic changes in the medial aspect of the right lower lobe. No lung nodules are seen no other than the left upper lobe nodules. Musculoskeletal: There are permeative destructive changes involving the posterior, lateral and anterior portions of the left fifth rib with associated lateral and posterior chest wall masses. There is also an anterior chest wall mass at the location of the left fourth costal cartilage. There is also a mildly displaced fracture of the left fourth rib laterally. The largest chest wall mass is located laterally, with a maximum thickness of 5.2 cm on image number 28 series 2. This involves a large portion of the left fifth rib. A posterior chest wall mass measures 1.7 cm in thickness on image number 18 series 2 and an anterior chest wall mass measures 2.0 cm in thickness on image number 40 series 2. Also noted are thoracic spine degenerative changes. CT ABDOMEN PELVIS FINDINGS Hepatobiliary: Mild sludge in the gallbladder. Normal appearing liver. Pancreas: Unremarkable. No pancreatic ductal dilatation or surrounding inflammatory changes. Spleen: Normal in size  without focal abnormality. Adrenals/Urinary Tract: Normal appearing adrenal glands. 3.2 x 3.1 cm solid mass in the posterior aspect of the mid right kidney on image number 71 series 2. Stomach/Bowel: Multiple colonic diverticula, most numerous in the sigmoid and descending colon. Unremarkable stomach and small bowel. No evidence of appendicitis. Vascular/Lymphatic: Multiple enlarged mesenteric and retroperitoneal lymph nodes. These include 2 large areas of confluent adenopathy in the mesentery  at the level of the upper pelvis. The more central mass measures 6.5 x 5.7 cm on image number 96 series 2 and the mass on the right measures 8.0 x 3.7 cm on the same image. The largest more proximal mesenteric node has a short axis diameter of 2.7 cm on image number 68 series 2. The largest retroperitoneal node is medial to the left kidney in the para-aortic region and measures 3.5 cm in short axis diameter on image number 69 series 2. Reproductive: Status post hysterectomy. No adnexal masses. Other: Small amount of free peritoneal fluid. Musculoskeletal: Lumbar lower thoracic spine degenerative changes. These include facet degenerative changes with associated grade 1 anterolisthesis at the L4-5 level. No evidence of bony metastatic disease in the abdomen or pelvis. IMPRESSION: 1. Extensive mesenteric, retroperitoneal, left hilar, mediastinal, left supraclavicular and left axillary adenopathy. Differential considerations include metastatic adenopathy, lymphoma and leukemia. The size of the nodes in the mesentery and retroperitoneum are suggestive of non-Hodgkin's lymphoma. 2. 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 Differential considerations include metastatic disease and lymphoma. A left upper lobe primary lung carcinoma is a possibility. 3. 3.2 cm solid right renal mass. Differential considerations include renal cell carcinoma and oncocytoma. 4. Small amount of free peritoneal fluid, most likely due to lymphatic obstruction by the mesenteric adenopathy. 5. Left fourth rib fracture. 6. Colonic diverticulosis. 7. Calcific coronary artery and aortic atherosclerosis. Aortic Atherosclerosis (ICD10-I70.0). Electronically Signed   By: Claudie Revering M.D.   On: 12/12/2016 19:19   Ct Abdomen Pelvis W Contrast  Result Date: 12/12/2016 CLINICAL DATA:  Upper back pain for the past 2 months, increasing. Persistent cough. Epigastric and right upper quadrant  abdominal pain. Shooting pain in the upper right thigh. Left extrapleural soft tissue mass with rib destruction on chest radiographs earlier today. EXAM: CT CHEST, ABDOMEN, AND PELVIS WITH CONTRAST TECHNIQUE: Multidetector CT imaging of the chest, abdomen and pelvis was performed following the standard protocol during bolus administration of intravenous contrast. CONTRAST:  151m ISOVUE-300 IOPAMIDOL (ISOVUE-300) INJECTION 61% COMPARISON:  None. Chest radiographs dated 12/12/2016. FINDINGS: CT CHEST FINDINGS Cardiovascular: Normal sized heart. Atheromatous arterial calcifications, including the coronary arteries and aorta. Mediastinum/Nodes: Enlarged AP window and left hilar lymph nodes. An AP window node has a short axis diameter of 14 mm on image number 25 series 2. A left hilar node has a short axis diameter of 21 mm on image number 30 series 2. There are multiple additional enlarged left hilar nodes. No enlarged right hilar nodes. There are 2 enlarged left axillary nodes. The more anterior node has a short axis diameter of 17 mm on image number 17 series 2 and the more posterior node has a short axis diameter 17 mm on image number 30 series 2. There are multiple enlarged left supraclavicular nodes. The largest has a short axis diameter of 15 mm on image number 8 series 2. A left superior mediastinal node has a short axis diameter of 10 mm on image number 14 series 2. Normal appearing thyroid gland.  Lungs/Pleura: There are multiple left upper lobe nodules. The majority are adjacent to a left chest wall mass. The largest of these has a maximum diameter of 1.7 cm on image number 58 series 4. A parenchymal nodule has a diameter 1.0 cm on image number 58 series for and an additional adjacent parenchymal nodule has a diameter of 1.0 cm on image number 60 series 4. More centrally in the left upper lobe, there are 2 adjacent nodules. One measures 2.0 cm on image number 56 series 4 and the other measures 1.4 cm on image  number 57 series 4. There are mild bronchiectatic changes in the medial aspect of the right lower lobe. No lung nodules are seen no other than the left upper lobe nodules. Musculoskeletal: There are permeative destructive changes involving the posterior, lateral and anterior portions of the left fifth rib with associated lateral and posterior chest wall masses. There is also an anterior chest wall mass at the location of the left fourth costal cartilage. There is also a mildly displaced fracture of the left fourth rib laterally. The largest chest wall mass is located laterally, with a maximum thickness of 5.2 cm on image number 28 series 2. This involves a large portion of the left fifth rib. A posterior chest wall mass measures 1.7 cm in thickness on image number 18 series 2 and an anterior chest wall mass measures 2.0 cm in thickness on image number 40 series 2. Also noted are thoracic spine degenerative changes. CT ABDOMEN PELVIS FINDINGS Hepatobiliary: Mild sludge in the gallbladder. Normal appearing liver. Pancreas: Unremarkable. No pancreatic ductal dilatation or surrounding inflammatory changes. Spleen: Normal in size without focal abnormality. Adrenals/Urinary Tract: Normal appearing adrenal glands. 3.2 x 3.1 cm solid mass in the posterior aspect of the mid right kidney on image number 71 series 2. Stomach/Bowel: Multiple colonic diverticula, most numerous in the sigmoid and descending colon. Unremarkable stomach and small bowel. No evidence of appendicitis. Vascular/Lymphatic: Multiple enlarged mesenteric and retroperitoneal lymph nodes. These include 2 large areas of confluent adenopathy in the mesentery at the level of the upper pelvis. The more central mass measures 6.5 x 5.7 cm on image number 96 series 2 and the mass on the right measures 8.0 x 3.7 cm on the same image. The largest more proximal mesenteric node has a short axis diameter of 2.7 cm on image number 68 series 2. The largest retroperitoneal  node is medial to the left kidney in the para-aortic region and measures 3.5 cm in short axis diameter on image number 69 series 2. Reproductive: Status post hysterectomy. No adnexal masses. Other: Small amount of free peritoneal fluid. Musculoskeletal: Lumbar lower thoracic spine degenerative changes. These include facet degenerative changes with associated grade 1 anterolisthesis at the L4-5 level. No evidence of bony metastatic disease in the abdomen or pelvis. IMPRESSION: 1. Extensive mesenteric, retroperitoneal, left hilar, mediastinal, left supraclavicular and left axillary adenopathy. Differential considerations include metastatic adenopathy, lymphoma and leukemia. The size of the nodes in the mesentery and retroperitoneum are suggestive of non-Hodgkin's lymphoma. 2. 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 Differential considerations include metastatic disease and lymphoma. A left upper lobe primary lung carcinoma is a possibility. 3. 3.2 cm solid right renal mass. Differential considerations include renal cell carcinoma and oncocytoma. 4. Small amount of free peritoneal fluid, most likely due to lymphatic obstruction by the mesenteric adenopathy. 5. Left fourth rib fracture. 6. Colonic diverticulosis. 7. Calcific coronary  artery and aortic atherosclerosis. Aortic Atherosclerosis (ICD10-I70.0). Electronically Signed   By: Claudie Revering M.D.   On: 12/12/2016 19:19   Mr Thoracic Spine Limited Wo Contrast  Result Date: 12/18/2016 CLINICAL DATA:  Bilateral lower extremity weakness. Status post fall yesterday. The patient has a history of carcinoma of unknown primary. EXAM: MRI THORACIC SPINE WITHOUT CONTRAST TECHNIQUE: Multiplanar, multisequence MR imaging of the thoracic spine was performed. No intravenous contrast was administered. COMPARISON:  CT chest 12/12/2016. FINDINGS: The patient could only tolerate scanning for a T2 weighted  sagittal sequence and scout imaging. The T7 vertebral body is replaced with tumor. Extensive epidural tumor is seen eccentric to the right extending cephalad into the T6-7 neural foramen on the right. Tumor also extends posteriorly and compresses the cord. The T7-8 foramen is also filled with tumor. No other evidence of epidural tumor is identified. IMPRESSION: Very limited examination demonstrating compression of the cord from approximately mid T6 to T7-8 by epidural tumor eccentric to the right and surrounding the cord both anteriorly and posteriorly. Tumor fills the right neural foramina at T6-7 and T7-8 and completely replaces the T7 vertebral body. These results were called by telephone at the time of interpretation on 12/18/2016 at 5:14 pm to Dr. Francine Graven , who verbally acknowledged these results. Electronically Signed   By: Inge Rise M.D.   On: 12/18/2016 17:20    ASSESSMENT & PLAN:   Stage IV diffuse large B-cell lymphoma We discussed staging of disease. With recent imaging study and positive biopsy, she does not need further PET CT scan or bone marrow biopsy.  It would not change management. We discussed systemic chemotherapy after surgery. Intention of treatment would be for cure We discussed extensively the logistics of chemotherapy. Initially, the patient was thinking about being treated in [place]. However, after extensive discussion, she is interested to be treated here in [place]. I recommend port placement before treatment and she agreed to proceed. I will order baseline blood work, echocardiogram and port placement. I recommend we wait at least 2-3 weeks after surgery before we proceed with systemic treatment. Tentatively, I think it would be possible to get her transferred to [facility] long hospital to start chemotherapy around January 06, 2017. I will be back next week to reassess her strength and her healing.  If she is doing well, she might start as early as 2 weeks  after surgery I am thinking about prescribing R-EPOCH along with intrathecal chemotherapy I will bring additional information and chemo education for the patient next week  Cord compression status post surgery She is really improving daily. I agree with physical therapy and rehabilitation.  Mild leukocytosis Likely secondary to steroid treatment  Anemia chronic disease with component of iron deficiency She is on oral replacement therapy  Chronic constipation On laxatives prn  Discharge planning I think it would be best if the patient get accepted for inpatient rehab I will be back next week to see her and discuss further plan related to chemotherapy Tentatively, she will begin cycle 1 of chemotherapy at [facility] long hospital around January 06, 2017. However, if she is doing well, she may start sooner around 2 weeks after surgery  All questions were answered. The patient knows to call the clinic with any problems, questions or concerns.    Heath Lark, MD 12/24/2016 3:47 PM

## 2016-12-24 NOTE — Progress Notes (Signed)
Patient ID: Katrina [name], female   DOB: 1950/11/23, 66 y.o.   MRN: [id number] Late entry note: Patient seen around doing much better neurologically continued improvement in strength in her lower extremity is. Incision clean dry and intact.  Pathology consistent with large B-cell lymphoma. I have had discussions with Katrina [name] and radiation oncology she'll be contacting oncology will see the patient today.Patient can be discharged to rehabilitation whenever bed becomes available.

## 2016-12-24 NOTE — Care Management Note (Signed)
Case Management Note  Patient Details  Name: Katrina [name] MRN: [id number] Date of Birth: 1950/08/02  Subjective/Objective:                    Action/Plan: Per patient her Medicare started Nov 1st. She states her daughter in law is going to bring card to hospital today. Plan is for SNF rehab. She would like [facility] and [facility] as second choice. CSW updated. CM following.  Expected Discharge Date:                  Expected Discharge Plan:  Skilled Nursing Facility  In-House Referral:  Clinical Social Work  Discharge planning Services  CM Consult  Post Acute Care Choice:    Choice offered to:     DME Arranged:    DME Agency:     HH Arranged:    [place] Agency:     Status of Service:  In process, will continue to follow  If discussed at Long Length of Stay Meetings, dates discussed:    Additional Comments:  Pollie Friar, RN 12/24/2016, 11:23 AM

## 2016-12-24 NOTE — Progress Notes (Signed)
CSW following to facilitate discharge planning. CSW notified by RN and RNCM that patient says she has Medicare that activated on 12/19/16; daughter-in-law to bring card to the hospital. CSW checked in with [facility] admissions to determine if patient could admit with Medicare; they will accept patient, pending review of Medicare.   CSW notified [facility] admissions of patient's Medicare number. CSW will continue to follow.  Laveda Abbe, [place] Clinical Social Worker [phone]

## 2016-12-24 NOTE — Progress Notes (Signed)
Dr. Saintclair Halsted from neurosurgery has requested that Katrina [name] be seen by Dr. Alvy Bimler from Med Onc today. In terms of treatment, he has recommended at least 2 weeks of recovery time from surgery before she begins any systemic treatment.    Mont Dutton R.T.(R)(T) Special Procedures Navigator [phone]

## 2016-12-24 NOTE — Progress Notes (Addendum)
Physical Therapy Treatment Patient Details Name: Katrina [name] MRN: [id number] DOB: Jul 24, 1950 Today's Date: 12/24/2016    History of Present Illness Patient is 66 year old female with past medical history of hypertension who recently discharge from [facility] after being admitted with abdominal pain and LE paresthesias. Upon evaluation was found to have renal mass, retroperitoneal adenopathy. Patient was discharge for outpatient biopsy. Patient returned to [facility] ED on 12/18/16 c/o progressive LE weakness. Found to have T6-T8 cord compression. Patient was transferred to [facility] for emergent surgical decompression. Patient underwent laminectomy with tumor resection.     PT Comments    Pt is progressing towards her goals and is motivated to get back to her PLOF. On 11/1 pt was modAx2 for stand pivot transfers to chair, today pt in minA for sit>stand transfers and is min-modAx2 for ambulation of 30 feet with RW, Pt is able to participate in 3 hours of therapy and would benefit from acute rehab to improve strength and balance working to improve her ambulation with her decreased LE sensation and weakness. Pt will continue to require acute PT to progress mobility, and improve strength and balance in preparation for CIR placement.    Follow Up Recommendations  CIR     Equipment Recommendations  None recommended by PT    Recommendations for Other Services       Precautions / Restrictions Precautions Precautions: Fall;Back Precaution Comments: Reviewed 3/3 back precautions  Other Brace/Splint: No brace per MD order Restrictions Weight Bearing Restrictions: No    Mobility  Bed Mobility               General bed mobility comments: up in recliner at entry   Transfers Overall transfer level: Needs assistance Equipment used: Rolling walker (2 wheeled) Transfers: Sit to/from [employer] Sit to Stand: Min assist         General transfer comment:  minA for power up and balancing in RW  Ambulation/Gait Ambulation/Gait assistance: Mod assist;+2 physical assistance Ambulation Distance (Feet): 30 Feet(1x seated rest break) Assistive device: Rolling walker (2 wheeled) Gait Pattern/deviations: Step-through pattern;Ataxic;Decreased dorsiflexion - right;Decreased dorsiflexion - left;Trunk flexed Gait velocity: slowed Gait velocity interpretation: Below normal speed for age/gender General Gait Details: minAx2 for gait with modAx2 for steadying with occasional buckling of R>L knee with gait, vc for increased knee flexion for foot placement under body, instead of swinging LE forward and letting fall down to floor, pt with better use of UE support on RW       Balance Overall balance assessment: Needs assistance Sitting-balance support: Feet supported;No upper extremity supported Sitting balance-Leahy Scale: Good     Standing balance support: Bilateral upper extremity supported;During functional activity Standing balance-Leahy Scale: Poor Standing balance comment: Reliant on UE support                            Cognition Arousal/Alertness: Awake/alert Behavior During Therapy: WFL for tasks assessed/performed;Anxious Overall Cognitive Status: Within Functional Limits for tasks assessed                                               Pertinent Vitals/Pain Pain Assessment: Faces Faces Pain Scale: Hurts a little bit Pain Location: Back Pain Descriptors / Indicators: Discomfort;Guarding;Sore Pain Intervention(s): Limited activity within patient's tolerance;Monitored during session  PT Goals (current goals can now be found in the care plan section) Acute Rehab PT Goals Patient Stated Goal: return to PLOF PT Goal Formulation: With patient Time For Goal Achievement: 01/02/17 Potential to Achieve Goals: Good Progress towards PT goals: Progressing toward goals    Frequency    Min  5X/week      PT Plan Current plan remains appropriate       AM-PAC PT "6 Clicks" Daily Activity  Outcome Measure  Difficulty turning over in bed (including adjusting bedclothes, sheets and blankets)?: Unable Difficulty moving from lying on back to sitting on the side of the bed? : Unable Difficulty sitting down on and standing up from a chair with arms (e.g., wheelchair, bedside commode, etc,.)?: Unable Help needed moving to and from a bed to chair (including a wheelchair)?: A Little Help needed walking in hospital room?: A Lot Help needed climbing 3-5 steps with a railing? : Total 6 Click Score: 9    End of Session Equipment Utilized During Treatment: Gait belt Activity Tolerance: Patient tolerated treatment well Patient left: in chair;with call bell/phone within reach;with chair alarm set;with family/visitor present Nurse Communication: Mobility status PT Visit Diagnosis: Pain;Other abnormalities of gait and mobility (R26.89);Unsteadiness on feet (R26.81);Muscle weakness (generalized) (M62.81);Ataxic gait (R26.0) Pain - part of body: (back)     Time: 3235-5732 PT Time Calculation (min) (ACUTE ONLY): 19 min  Charges:  $Gait Training: 8-22 mins                    G Codes:       Duvid Smalls B. Migdalia Dk PT, DPT Acute Rehabilitation  [phone] Pager [phone]     [place] 12/24/2016, 5:00 PM

## 2016-12-25 ENCOUNTER — Encounter (HOSPITAL_COMMUNITY): Payer: Self-pay | Admitting: Physical Medicine and Rehabilitation

## 2016-12-25 ENCOUNTER — Inpatient Hospital Stay (HOSPITAL_COMMUNITY): Payer: Medicare Other

## 2016-12-25 DIAGNOSIS — C8330 Diffuse large B-cell lymphoma, unspecified site: Secondary | ICD-10-CM

## 2016-12-25 DIAGNOSIS — G8220 Paraplegia, unspecified: Secondary | ICD-10-CM

## 2016-12-25 DIAGNOSIS — I10 Essential (primary) hypertension: Secondary | ICD-10-CM

## 2016-12-25 LAB — COMPREHENSIVE METABOLIC PANEL
ALK PHOS: 45 U/L (ref 38–126)
ALT: 29 U/L (ref 14–54)
AST: 22 U/L (ref 15–41)
Albumin: 3 g/dL — ABNORMAL LOW (ref 3.5–5.0)
Anion gap: 8 (ref 5–15)
BUN: 29 mg/dL — AB (ref 6–20)
CHLORIDE: 98 mmol/L — AB (ref 101–111)
CO2: 28 mmol/L (ref 22–32)
CREATININE: 0.92 mg/dL (ref 0.44–1.00)
Calcium: 8.5 mg/dL — ABNORMAL LOW (ref 8.9–10.3)
GFR calc Af Amer: >60 mL/min (ref >60)
GFR calc non Af Amer: >60 mL/min (ref >60)
Glucose, Bld: 118 mg/dL — ABNORMAL HIGH (ref 65–99)
Potassium: 4 mmol/L (ref 3.5–5.1)
SODIUM: 134 mmol/L — AB (ref 135–145)
Total Bilirubin: 0.4 mg/dL (ref 0.3–1.2)
Total Protein: 6 g/dL — ABNORMAL LOW (ref 6.5–8.1)

## 2016-12-25 LAB — CBC WITH DIFFERENTIAL/PLATELET
BASOS ABS: 0 10*3/uL (ref 0.0–0.1)
Basophils Relative: 0 %
EOS ABS: 0 10*3/uL (ref 0.0–0.7)
EOS PCT: 0 %
HCT: 30.4 % — ABNORMAL LOW (ref 36.0–46.0)
HEMOGLOBIN: 9.8 g/dL — AB (ref 12.0–15.0)
LYMPHS PCT: 11 %
Lymphs Abs: 1.4 10*3/uL (ref 0.7–4.0)
MCH: 26.5 pg (ref 26.0–34.0)
MCHC: 32.2 g/dL (ref 30.0–36.0)
MCV: 82.2 fL (ref 78.0–100.0)
Monocytes Absolute: 1.2 10*3/uL — ABNORMAL HIGH (ref 0.1–1.0)
Monocytes Relative: 9 %
NEUTROS PCT: 80 %
Neutro Abs: 10.4 10*3/uL — ABNORMAL HIGH (ref 1.7–7.7)
PLATELETS: 399 10*3/uL (ref 150–400)
RBC: 3.7 MIL/uL — AB (ref 3.87–5.11)
RDW: 14.8 % (ref 11.5–15.5)
WBC: 13 10*3/uL — AB (ref 4.0–10.5)

## 2016-12-25 LAB — ECHOCARDIOGRAM COMPLETE
Height: 68 in
Weight: 4024 oz

## 2016-12-25 LAB — URIC ACID: Uric Acid, Serum: 6.8 mg/dL — ABNORMAL HIGH (ref 2.3–6.6)

## 2016-12-25 LAB — LACTATE DEHYDROGENASE: LDH: 136 U/L (ref 98–192)

## 2016-12-25 NOTE — Progress Notes (Signed)
Physical Therapy Treatment Patient Details Name: Katrina [name] MRN: [id number] DOB: 1950-05-07 Today's Date: 12/25/2016    History of Present Illness Patient is 66 year old female with past medical history of hypertension who recently discharge from [facility] after being admitted with abdominal pain and LE paresthesias. Upon evaluation was found to have renal mass, retroperitoneal adenopathy. Patient was discharge for outpatient biopsy. Patient returned to [facility] ED on 12/18/16 c/o progressive LE weakness. Found to have T6-T8 cord compression. Patient was transferred to [facility] for emergent surgical decompression. Patient underwent laminectomy with tumor resection.     PT Comments    Pt noticeably fatigued today on entry of room. Pt reports she is not feeling very strong bur agreeable to trying some PT. Pt required modAx2 for gait and steadying in RW to ambulate 30 feet in the hallway with one seated rest break. Pt requested to try another bout of walking after sitting for second time but increased fatigue made it unsafe to attempt. Pt apologetic for her performance today with therapy. PT reassured pt that she had done her best and that she needed to rest. Pt requires skilled PT in the acute to progress mobility and improve strength and endurance to safely navigate their discharge environment.     Follow Up Recommendations  CIR     Equipment Recommendations  None recommended by PT    Recommendations for Other Services       Precautions / Restrictions Precautions Precautions: Fall;Back Precaution Comments: Reviewed 3/3 back precautions  Other Brace/Splint: No brace per MD order Restrictions Weight Bearing Restrictions: No    Mobility  Bed Mobility               General bed mobility comments: up in recliner at entry   Transfers Overall transfer level: Needs assistance Equipment used: Rolling walker (2 wheeled) Transfers: Sit to/from [employer] Sit to Stand: Mod assist         General transfer comment: modA for powerup to RW  Ambulation/Gait Ambulation/Gait assistance: Mod assist;+2 physical assistance Ambulation Distance (Feet): 30 Feet(required one seated rest break) Assistive device: Rolling walker (2 wheeled) Gait Pattern/deviations: Step-through pattern;Ataxic;Decreased dorsiflexion - right;Decreased dorsiflexion - left;Trunk flexed Gait velocity: slowed Gait velocity interpretation: Below normal speed for age/gender General Gait Details: modAx2 for gait and steadying with RW, pt notably more fatigued today with therapy, vc for sequencing and for achieving balance before starting next step, pt still lacks adequate knee flexion to place foot down in close proximity, instead swing her LE forward from her hip and ocassionally outside her base of support which requires her to slide it back under her.      Balance Overall balance assessment: Needs assistance Sitting-balance support: Feet supported;No upper extremity supported Sitting balance-Leahy Scale: Good Sitting balance - Comments: Maintains sitting balance at EOB and on BSC   Standing balance support: Bilateral upper extremity supported;During functional activity Standing balance-Leahy Scale: Poor Standing balance comment: Reliant on UE support                            Cognition Arousal/Alertness: Awake/alert Behavior During Therapy: WFL for tasks assessed/performed;Anxious Overall Cognitive Status: Within Functional Limits for tasks assessed  General Comments General comments (skin integrity, edema, etc.): Pt reports greater sensation in bilateral feet today stating that she can feel the rubber grips on the bottom of her socks.       Pertinent Vitals/Pain Pain Assessment: Faces Faces Pain Scale: Hurts a little bit Pain Location: Back Pain Descriptors / Indicators:  Discomfort;Guarding;Sore Pain Intervention(s): Limited activity within patient's tolerance;Monitored during session           PT Goals (current goals can now be found in the care plan section) Acute Rehab PT Goals Patient Stated Goal: return to PLOF PT Goal Formulation: With patient Time For Goal Achievement: 01/02/17 Potential to Achieve Goals: Good Progress towards PT goals: Progressing toward goals    Frequency    Min 5X/week      PT Plan Current plan remains appropriate       AM-PAC PT "6 Clicks" Daily Activity  Outcome Measure  Difficulty turning over in bed (including adjusting bedclothes, sheets and blankets)?: Unable Difficulty moving from lying on back to sitting on the side of the bed? : Unable Difficulty sitting down on and standing up from a chair with arms (e.g., wheelchair, bedside commode, etc,.)?: Unable Help needed moving to and from a bed to chair (including a wheelchair)?: A Little Help needed walking in hospital room?: A Lot Help needed climbing 3-5 steps with a railing? : Total 6 Click Score: 9    End of Session Equipment Utilized During Treatment: Gait belt Activity Tolerance: Patient tolerated treatment well Patient left: in chair;with call bell/phone within reach;with chair alarm set;with family/visitor present Nurse Communication: Mobility status PT Visit Diagnosis: Pain;Other abnormalities of gait and mobility (R26.89);Unsteadiness on feet (R26.81);Muscle weakness (generalized) (M62.81);Ataxic gait (R26.0) Pain - part of body: (back)     Time: 5400-8676 PT Time Calculation (min) (ACUTE ONLY): 24 min  Charges:  $Gait Training: 23-37 mins                    G Codes:       Laurabeth Yip B. Migdalia Dk PT, DPT Acute Rehabilitation  [phone] Pager [phone]     [place] 12/25/2016, 5:43 PM

## 2016-12-25 NOTE — PMR Pre-admission (Signed)
PMR Admission Coordinator Pre-Admission Assessment  Patient: Katrina [name] is an 66 y.o., female MRN: [id number] DOB: [birth year] Height: 5' 8"  (172.7 cm) Weight: 114.1 kg (251 lb 8 oz)             Insurance Information HMO: Yes    PPO:       PCP:       IPA:       80/20:       OTHER:   PRIMARY[employer]da Rain[employer] Medicare      Policy#: [id number]      Subscriber:  Christeen Douglas CM Name: Vevelyn Royals      Phone#: [phone]     Fax#: [phone] Pre-Cert#:  [id number] with weekly updates      Employer:  Retired Benefits:  Phone #: [phone]     Name:  Online Eff. Date: 12/19/16     Deduct:  $0      Out of Pocket Max: $6700 Met $0)      Life Max: N/A CIR: $430 days 1-4      SNF: $0 days 1-20; $160 days 21-62; $0 days 63-100 Outpatient: medical necessity     Co-Pay: $40/visit Home Health: 100%      Co-Pay: none DME: 80%     Co-Pay: 20% Providers: in network  Medicaid Application Date:        Case Manager:   Disability Application Date:        Case Worker:    Emergency Facilities manager Information    Name Relation Home Work Mobile   [place] Son   [place] Sister [phone]     Yisroel Ramming [phone]       Current Medical History  Patient Admitting Diagnosis:  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 female who was admitted to [facility] on 10/25 - 12/14/16 with generalized weakness, back pain and BLE numbness/weakness with difficulty walking. She was found to have extensive diffuse lymphadenopathy, LUL and left chest wall metastases, right renal mass and pathologic left rib fractures. She was discharged to home with walker and plans to follow up with Hem/onc but continued to have progressive RLE weakness with foot drop and ascending numbness to lower abdomen. She was admitted on 12/18/16 for work up and  MRI thoracic spine revealed extensive epidural tumor eccentric to right and surrounding the cord from T6/7  to T7/8 with cord compression and replacing T7 vertebral body.  She was evaluated by Dr. Saintclair Halsted and taken to OR the same day for decompressive thoracic laminectomy from T5-T8 for resection of tumor. Pathology revealed diffuse large B-cell lymphoma and Dr. Alvy Bimler consulted for input and recommend starting first round of chemo Nov 19th or sooner if doing well. Interventional radiology consulted to place [facility] and XRT to begin 2 weeks post op. Patient with paraplegia and significant deficits in mobility as well as ability to carry out ADL tasks. Patient lives alone and sedentary but was independent PTA. Sat up in chair all day yesterday and this am. Was staying with son due to weakness and plans to d/c there.  Patient feels that her sensation is starting to return on the left side, still feels numb in the right lower extremity as well as lower abdominal area.  States that she is able to control her bladder, has been constipated with bowels.  CIR recommended by rehab team.  Past Medical History  Past Medical History:  Diagnosis Date  . Anemia   .  Arthritis   . 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 Hypertension   . Lymphadenopathy 11/2016   "extensive"  . Paresthesia of lower extremity 11/2016   bilateral  . Pulmonary nodules 11/2016  . Right renal mass 11/2016    Family History  family history includes CAD in her mother; COPD in her mother; Colon cancer in her brother, father, and sister; Diabetes Mellitus II in her mother and sister; Thyroid disease in her sister.  Prior Rehab/Hospitalizations: Had outpatient therapy after shoulder surgery in 2007  Has the patient had major surgery during 100 days prior to admission? No  Current Medications   Current Facility-Administered Medications:  .  0.9 %  sodium chloride infusion, 250 mL, Intravenous, Continuous, Kary Kos, MD .  acetaminophen (TYLENOL) tablet 650 mg, 650 mg, Oral, Q4H PRN **OR** acetaminophen (TYLENOL) suppository 650 mg, 650 mg, Rectal, Q4H PRN,  Kary Kos, MD .  alum & mag hydroxide-simeth (MAALOX/MYLANTA) 200-200-20 MG/5ML suspension 30 mL, 30 mL, Oral, Q6H PRN, Kary Kos, MD .  amLODipine (NORVASC) tablet 10 mg, 10 mg, Oral, Daily, Patrecia Pour, Christean Grief, MD, 10 mg at 12/26/16 0903 .  cyclobenzaprine (FLEXERIL) tablet 10 mg, 10 mg, Oral, TID PRN, Kary Kos, MD, 10 mg at 12/26/16 0903 .  dexamethasone (DECADRON) injection 10 mg, 10 mg, Intravenous, Q24H, Patrecia Pour, Christean Grief, MD, 10 mg at 12/26/16 0903 .  docusate sodium (COLACE) capsule 100 mg, 100 mg, Oral, Daily, Kary Kos, MD, 100 mg at 12/26/16 0903 .  ferrous sulfate tablet 325 mg, 325 mg, Oral, BID WC, Patrecia Pour, Christean Grief, MD, 325 mg at 12/26/16 0801 .  hydrALAZINE (APRESOLINE) injection 5 mg, 5 mg, Intravenous, Q2H PRN, Ivor Costa, MD .  HYDROmorphone (DILAUDID) injection 0.5 mg, 0.5 mg, Intravenous, Q2H PRN, Kary Kos, MD, 0.5 mg at 12/19/16 0442 .  menthol-cetylpyridinium (CEPACOL) lozenge 3 mg, 1 lozenge, Oral, PRN **OR** phenol (CHLORASEPTIC) mouth spray 1 spray, 1 spray, Mouth/Throat, PRN, Kary Kos, MD .  ondansetron (ZOFRAN) tablet 4 mg, 4 mg, Oral, Q6H PRN **OR** ondansetron (ZOFRAN) injection 4 mg, 4 mg, Intravenous, Q6H PRN, Kary Kos, MD .  oxyCODONE (Oxy IR/ROXICODONE) immediate release tablet 10 mg, 10 mg, Oral, Q3H PRN, Kary Kos, MD, 10 mg at 12/26/16 0903 .  pantoprazole (PROTONIX) EC tablet 40 mg, 40 mg, Oral, QHS, Kary Kos, MD, 40 mg at 12/25/16 2139 .  senna-docusate (Senokot-S) tablet 2 tablet, 2 tablet, Oral, QHS PRN, Newman Pies, MD, 2 tablet at 12/21/16 1830 .  sodium chloride flush (NS) 0.9 % injection 3 mL, 3 mL, Intravenous, Q12H, Kary Kos, MD, 3 mL at 12/26/16 0904 .  sodium chloride flush (NS) 0.9 % injection 3 mL, 3 mL, Intravenous, PRN, Kary Kos, MD .  triamterene-hydrochlorothiazide (DYAZIDE) 37.5-25 MG per capsule 1 capsule, 1 capsule, Oral, Daily, Kary Kos, MD, 1 capsule at 12/26/16 0903 .  zolpidem (AMBIEN) tablet 5 mg, 5 mg, Oral,  QHS PRN, Ivor Costa, MD  Patients Current Diet: Diet regular Room service appropriate? Yes; Fluid consistency: Thin  Precautions / Restrictions Precautions Precautions: Fall, Back Precaution Booklet Issued: Yes (comment) Precaution Comments: Reviewed 3/3 back precautions  Other Brace/Splint: No brace per MD order Restrictions Weight Bearing Restrictions: No   Has the patient had 2 or more falls or a fall with injury in the past year?No.  Does report 1 fall with very minor injury to her hand  Prior Activity Level Limited Community (1-2x/wk): Went out 3-4 X a week, was driving.  Home Assistive Devices / Equipment  Home Assistive Devices/Equipment: None Home Equipment: Bedside commode, Wheelchair - manual, Walker - 2 wheels, Shower seat  Prior Device Use: Indicate devices/aids used by the patient prior to current illness, exacerbation or injury? None  Prior Functional Level Prior Function Level of Independence: Independent Comments: Pt independent, but has been staying at son's house past few days. Son has been assisting with ADLs and IADLs  Self Care: Did the patient need help bathing, dressing, using the toilet or eating?  Independent  Indoor Mobility: Did the patient need assistance with walking from room to room (with or without device)? Independent  Stairs: Did the patient need assistance with internal or external stairs (with or without device)? Independent  Functional Cognition: Did the patient need help planning regular tasks such as shopping or remembering to take medications? Independent  Current Functional Level Cognition  Overall Cognitive Status: Within Functional Limits for tasks assessed Orientation Level: Oriented X4 General Comments: patietn is agreeable to participate in therapy and demonstrated active listening and an eagerness to learn, patient with overall pleasant demeanor    Extremity Assessment (includes Sensation/Coordination)  Upper Extremity  Assessment: Defer to OT evaluation  Lower Extremity Assessment: Generalized weakness, RLE deficits/detail RLE Deficits / Details: pt with decreased sensation to light touch throughout and decreased strength. MMT revealed 3-/5 for knee flexion, knee extension and ankle DF.    ADLs  Overall ADL's : Needs assistance/impaired Eating/Feeding: Set up, Sitting Grooming: Set up, Sitting, Oral care, Wash/dry hands, Wash/dry face, Brushing hair Upper Body Bathing: Set up, Sitting Upper Body Bathing Details (indicate cue type and reason): while seated on commode chair  Lower Body Bathing: Maximal assistance, Sit to/from stand Lower Body Bathing Details (indicate cue type and reason): Educated pt on AE for LB bathing. Upper Body Dressing : Set up, Sitting Lower Body Dressing: Minimal assistance, Sit to/from stand, +2 for safety/equipment, Cueing for sequencing, With adaptive equipment, Adhering to back precautions Lower Body Dressing Details (indicate cue type and reason): Provided education on AE for LB dressing. Pt donned socks and underwear with Min A for balance in standing.  Toilet Transfer: Moderate assistance, Ambulation, RW, BSC, Cueing for safety, Cueing for sequencing Toilet Transfer Details (indicate cue type and reason): pt with decreased balance with reports of poor sensation and proprioception in B feet. Toileting- Clothing Manipulation and Hygiene: Minimal assistance, Cueing for safety, Cueing for sequencing, Sit to/from stand Toileting - Clothing Manipulation Details (indicate cue type and reason): Pt standing from [facility] with RW with min A for standing balance while pt performed hygiene Functional mobility during ADLs: Moderate assistance, +2 for safety/equipment, Rolling walker General ADL Comments: Pt verbalized 3/3 precautions accurately this session and maintaining with functional mobility trasks and self care.    Mobility  Overal bed mobility: Needs Assistance Bed Mobility: Rolling,  Sidelying to Sit Rolling: Min guard Sidelying to sit: Min guard General bed mobility comments: up in recliner at entry     Transfers  Overall transfer level: Needs assistance Equipment used: Rolling walker (2 wheeled) Transfers: Sit to/from Stand, [employer] Transfers Sit to Stand: Mod assist Stand pivot transfers: Mod assist General transfer comment: modA for powerup to RW    Ambulation / Gait / Stairs / Wheelchair Mobility  Ambulation/Gait Ambulation/Gait assistance: Mod assist, +2 physical assistance Ambulation Distance (Feet): 30 Feet(required one seated rest break) Assistive device: Rolling walker (2 wheeled) Gait Pattern/deviations: Step-through pattern, Ataxic, Decreased dorsiflexion - right, Decreased dorsiflexion - left, Trunk flexed General Gait Details: modAx2 for gait and steadying  with RW, pt notably more fatigued today with therapy, vc for sequencing and for achieving balance before starting next step, pt still lacks adequate knee flexion to place foot down in close proximity, instead swing her LE forward from her hip and ocassionally outside her base of support which requires her to slide it back under her. Gait velocity: slowed Gait velocity interpretation: Below normal speed for age/gender    Posture / Balance Dynamic Sitting Balance Sitting balance - Comments: Maintains sitting balance at EOB and on BSC Balance Overall balance assessment: Needs assistance Sitting-balance support: Feet supported, No upper extremity supported Sitting balance-Leahy Scale: Good Sitting balance - Comments: Maintains sitting balance at EOB and on BSC Standing balance support: Bilateral upper extremity supported, During functional activity Standing balance-Leahy Scale: Poor Standing balance comment: Reliant on UE support    Special needs/care consideration BiPAP/CPAP No CPM No Continuous Drip IV No Dialysis No       Life Vest No Oxygen No Special Bed No Trach Size No Wound Vac  (area) No     Skin No                         Bowel mgmt: Last BM 12/21/16 Bladder mgmt: Voiding up on BSC with help Diabetic mgmt No    Previous Home Environment Living Arrangements: Spouse/significant other Available Help at Discharge: Family Type of Home: House Home Layout: One level Home Access: Stairs to enter Entrance Stairs-Rails: Can reach both Entrance Stairs-Number of Steps: 3-4 Bathroom Shower/Tub: Tub/shower unit, Architectural technologist: Programmer, systems: Yes Home Care Services: No Additional Comments: Been staying at her son's house since 12/14/16(Above information is son's home)  Discharge Living Setting Plans for Discharge Living Setting: House, Lives with (comment)(Has been living with son since becoming ill.) Type of Home at Discharge: House Discharge Home Layout: One level Discharge Home Access: Stairs to enter Entrance Stairs-Number of Steps: 3 steps and then 1 step into home Does the patient have any problems obtaining your medications?: No  Social/Family/Support Systems Patient Roles: Parent, Other (Comment)(Has a son, granddaughter and sister.) Contact Information: Ladona Mow - son - [phone] Anticipated Caregiver: son, grand daughter, sister Ability/Limitations of Caregiver: Son works.  [place] daughter is 43 yo and may stay.  Sister may be able to stay as well.(Family trying to have caregiver around the clock.) Caregiver Availability: Other (Comment) Discharge Plan Discussed with Primary Caregiver: Yes Is Caregiver In Agreement with Plan?: Yes Does Caregiver/Family have Issues with Lodging/Transportation while Pt is in Rehab?: No  Goals/Additional Needs Patient/Family Goal for Rehab: PT/OT supervision goals Expected length of stay: 10-12 days Cultural Considerations: [place] Needs: Regular diet, thin liquids Equipment Needs: TBD Special Service Needs: Patient will need to discharge to [facility] to  begin chemotherapy on or bout 01/06/17 per oncologist. Pt/Family Agrees to Admission and willing to participate: Yes Program Orientation Provided & Reviewed with Pt/Caregiver Including Roles  & Responsibilities: Yes  Decrease burden of Care through IP rehab admission: N/A  Possible need for SNF placement upon discharge: Not planned.  Plan is to admit to [facility] on or about 01/06/17 to begin chemotherapy.  Patient Condition: This patient's condition remains as documented in the consult dated 12/25/16, in which the Rehabilitation Physician determined and documented that the patient's condition is appropriate for intensive rehabilitative care in an inpatient rehabilitation facility. Will admit to inpatient rehab today.  Preadmission Screen Completed By:  Retta Diones, 12/26/2016 10:16  AM ______________________________________________________________________   Discussed status with Dr. Letta Pate on 12/26/16 at 1016 and received telephone approval for admission today.  Admission Coordinator:  Retta Diones, time 1016/Date 12/26/16

## 2016-12-25 NOTE — Progress Notes (Signed)
  Echocardiogram 2D Echocardiogram has been performed.  Katrina [name] 12/25/2016, 9:43 AM

## 2016-12-25 NOTE — Consult Note (Signed)
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  Referring Physician: Dr. Saintclair Halsted.    HPI: Katrina [name] is a 66 y.o. female who was admitted to [facility] on 10/25 - 12/14/16 with generalized weakness, back pain and BLE numbness/weakness with difficulty walking. She was found to have extensive diffuse lymphadenopathy, LUL and left chest wall metastases, right renal mass and pathologic left rib fractures. She was discharged to home with walker and plans to follow up with Hem/onc but continued to have progressive RLE weakness with foot drop and ascending numbness to lower abdomen. 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  She was evaluated by Dr. Saintclair Halsted and taken to OR the same day for decompressive thoracic laminectomy from T5-T8 for resection of tumor.  Pathology revealed diffuse large B-cell lymphoma and Dr. Alvy Bimler consulted for input and recommend starting first round of chemo Nov 19th or sooner if doing well. Interventional radiology consulted to place [facility] and XRT to begin 2 weeks post op. Patient with paraplegia and significant deficits in mobility as well as ability to carry out ADL tasks. CIR recommended by rehab team.  Patient lives alone and sedentary but was independent PTA. Sat up in chair all day yesterday and this am. Was staying with son due to weakness and plans to d/c there.    Patient feels that her sensation is starting to return on the left side, still feels numb in the right lower extremity as well as lower abdominal area.  States that she is able to control her bladder, has been constipated with bowels  Review of Systems  HENT: Negative for hearing loss and tinnitus.   Eyes: Negative for blurred vision.    Respiratory: Positive for shortness of breath (chronic for past 4-5 years). Negative for cough.   Cardiovascular: Negative for chest pain and palpitations.       Band like sensation around the chest (under breast)  Gastrointestinal: Positive for constipation (chronic) and heartburn. Negative for abdominal pain, nausea and vomiting.  Genitourinary: Negative for dysuria and urgency.  Musculoskeletal: Positive for back pain.  Neurological: Positive for sensory change, focal weakness and weakness.  Psychiatric/Behavioral: The patient is not nervous/anxious and does not have insomnia.       Past Medical History:  Diagnosis Date  . Anemia   . Arthritis   . 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 Hypertension   . Lymphadenopathy 11/2016   "extensive"  . Paresthesia of lower extremity 11/2016   bilateral  . Pulmonary nodules 11/2016  . Right renal mass 11/2016    Past Surgical History:  Procedure Laterality Date  . ABDOMINAL HYSTERECTOMY    . BREAST LUMPECTOMY     left  . SHOULDER SURGERY     right    Family History  Problem Relation Age of Onset  . Colon cancer Sister        at age 11  . Colon cancer Brother        at age 53  . COPD Mother   . Diabetes Mellitus II Mother   . CAD Mother        Angina  . Colon cancer Father   . Thyroid disease Sister   . Diabetes Mellitus II Sister  Social History:  Widowed. Independent without AD. Social smoker--quit 20 years ago but exposed to second hand smoke (father and husband). She smoked cigarettes. She has never used smokeless tobacco. She drinks wine occasionally. She reports that she does not use drugs.    Allergies  Allergen Reactions  . Contrast Media [Iodinated Diagnostic Agents] Itching    Medications Prior to Admission  Medication Sig Dispense Refill  . docusate sodium (COLACE) 100 MG capsule Take 100 mg by mouth daily.    Marland Kitchen ibuprofen (ADVIL,MOTRIN) 400 MG tablet Take 1 tablet (400 mg total) by mouth every 8 (eight) hours as  needed for moderate pain. 20 tablet 0  . oxyCODONE-acetaminophen (PERCOCET/ROXICET) 5-325 MG tablet Take 1 tablet by mouth every 6 (six) hours as needed for severe pain. 10 tablet 0  . triamterene-hydrochlorothiazide (DYAZIDE) 37.5-25 MG capsule Take 1 capsule by mouth daily.      Home: Home Living Family/patient expects to be discharged to:: Private residence Living Arrangements: Spouse/significant other Available Help at Discharge: Family Type of Home: House Home Access: Stairs to enter Technical brewer of Steps: 3-4 Entrance Stairs-Rails: Can reach both Home Layout: One level Bathroom Shower/Tub: Tub/shower unit, Architectural technologist: Programmer, systems: Yes Home Equipment: Bedside commode, Wheelchair - manual, Environmental consultant - 2 wheels, Shower seat Additional Comments: Been staying at her son's house since 12/14/16(Above information is son's home)  Functional History: Prior Function Level of Independence: Independent Comments: Pt independent, but has been staying at son's house past few days. Son has been assisting with ADLs and IADLs Functional Status:  Mobility: Bed Mobility Overal bed mobility: Needs Assistance Bed Mobility: Rolling, Sidelying to Sit Rolling: Min guard Sidelying to sit: Min guard General bed mobility comments: from flat bed Transfers Overall transfer level: Needs assistance Equipment used: Rolling walker (2 wheeled) Transfers: Sit to/from Stand, [employer] Transfers Sit to Stand: Min assist Stand pivot transfers: Mod assist General transfer comment: mod A for balance with min cues for RW advancement and sequencing Ambulation/Gait Ambulation/Gait assistance: Mod assist, +2 physical assistance Ambulation Distance (Feet): 30 Feet(1x seated rest break) Assistive device: Rolling walker (2 wheeled) Gait Pattern/deviations: Step-through pattern, Ataxic, Decreased dorsiflexion - right, Decreased dorsiflexion - left, Trunk flexed General Gait  Details: minAx2 for gait with modAx2 for steadying with occasional buckling of R>L knee with gait, vc for increased knee flexion for foot placement under body, instead of swinging LE forward and letting fall down to floor, pt with better use of UE support on RW  Gait velocity: slowed Gait velocity interpretation: Below normal speed for age/gender    ADL: ADL Overall ADL's : Needs assistance/impaired Eating/Feeding: Set up, Sitting Grooming: Set up, Sitting, Oral care, Wash/dry hands, Wash/dry face, Brushing hair Upper Body Bathing: Set up, Sitting Upper Body Bathing Details (indicate cue type and reason): while seated on commode chair  Lower Body Bathing: Maximal assistance, Sit to/from stand Lower Body Bathing Details (indicate cue type and reason): Educated pt on AE for LB bathing. Upper Body Dressing : Set up, Sitting Lower Body Dressing: Minimal assistance, Sit to/from stand, +2 for safety/equipment, Cueing for sequencing, With adaptive equipment, Adhering to back precautions Lower Body Dressing Details (indicate cue type and reason): Provided education on AE for LB dressing. Pt donned socks and underwear with Min A for balance in standing.  Toilet Transfer: Moderate assistance, Ambulation, RW, BSC, Cueing for safety, Cueing for sequencing Toilet Transfer Details (indicate cue type and reason): pt with decreased balance with reports of poor sensation and proprioception  in B feet. Toileting- Clothing Manipulation and Hygiene: Minimal assistance, Cueing for safety, Cueing for sequencing, Sit to/from stand Toileting - Clothing Manipulation Details (indicate cue type and reason): Pt standing from [facility] with RW with min A for standing balance while pt performed hygiene Functional mobility during ADLs: Moderate assistance, +2 for safety/equipment, Rolling walker General ADL Comments: Pt verbalized 3/3 precautions accurately this session and maintaining with functional mobility trasks and self  care.  Cognition: Cognition Overall Cognitive Status: Within Functional Limits for tasks assessed Orientation Level: Oriented X4 Cognition Arousal/Alertness: Awake/alert Behavior During Therapy: WFL for tasks assessed/performed Overall Cognitive Status: Within Functional Limits for tasks assessed General Comments: patietn is agreeable to participate in therapy and demonstrated active listening and an eagerness to learn, patient with overall pleasant demeanor   Blood pressure 131/66, pulse 61, temperature 98.1 F (36.7 C), temperature source Oral, resp. rate 20, height 5\' 8"  (1.727 m), weight 114.1 kg (251 lb 8 oz), SpO2 100 %. Physical Exam  Nursing note and vitals reviewed. Constitutional: She is oriented to person, place, and time. She appears well-developed and well-nourished. No distress.  Obese female in NAD.   HENT:  Head: Normocephalic and atraumatic.  Mouth/Throat: Oropharynx is clear and moist.  Neck: Normal range of motion. Neck supple.  Cardiovascular: Normal rate and regular rhythm.  Respiratory: Effort normal and breath sounds normal. No stridor.  GI: Soft. Bowel sounds are normal. She exhibits no distension. There is no tenderness.  Musculoskeletal: She exhibits no edema or tenderness.  Neurological: She is alert and oriented to person, place, and time.  Speech clear. Able to follow commands without difficulty. Sensory deficits below T4. Paraparesis RLE> LLE with decreased coordination.   Skin: Skin is warm and dry. She is not diaphoretic.  Psychiatric: She has a normal mood and affect. Her behavior is normal. Judgment and thought content normal.  Motor strength is 5/5 bilateral deltoid, bicep, tricep, grip, 3- right hip flexor knee extensor ankle dorsiflexor, 4- in the left hip flexor knee extensor ankle dorsiflexor Patient has a partial preservation to light touch on the left side below T8 distribution  Results for orders placed or performed during the hospital  encounter of 12/18/16 (from the past 24 hour(s))  CBC with Differential/Platelet     Status: Abnormal   Collection Time: 12/25/16  4:34 AM  Result Value Ref Range   WBC 13.0 (H) 4.0 - 10.5 K/uL   RBC 3.70 (L) 3.87 - 5.11 MIL/uL   Hemoglobin 9.8 (L) 12.0 - 15.0 g/dL   HCT 30.4 (L) 36.0 - 46.0 %   MCV 82.2 78.0 - 100.0 fL   MCH 26.5 26.0 - 34.0 pg   MCHC 32.2 30.0 - 36.0 g/dL   RDW 14.8 11.5 - 15.5 %   Platelets 399 150 - 400 K/uL   Neutrophils Relative % 80 %   Neutro Abs 10.4 (H) 1.7 - 7.7 K/uL   Lymphocytes Relative 11 %   Lymphs Abs 1.4 0.7 - 4.0 K/uL   Monocytes Relative 9 %   Monocytes Absolute 1.2 (H) 0.1 - 1.0 K/uL   Eosinophils Relative 0 %   Eosinophils Absolute 0.0 0.0 - 0.7 K/uL   Basophils Relative 0 %   Basophils Absolute 0.0 0.0 - 0.1 K/uL  Comprehensive metabolic panel     Status: Abnormal   Collection Time: 12/25/16  4:34 AM  Result Value Ref Range   Sodium 134 (L) 135 - 145 mmol/L   Potassium 4.0 3.5 - 5.1 mmol/L  Chloride 98 (L) 101 - 111 mmol/L   CO2 28 22 - 32 mmol/L   Glucose, Bld 118 (H) 65 - 99 mg/dL   BUN 29 (H) 6 - 20 mg/dL   Creatinine, Ser 0.92 0.44 - 1.00 mg/dL   Calcium 8.5 (L) 8.9 - 10.3 mg/dL   Total Protein 6.0 (L) 6.5 - 8.1 g/dL   Albumin 3.0 (L) 3.5 - 5.0 g/dL   AST 22 15 - 41 U/L   ALT 29 14 - 54 U/L   Alkaline Phosphatase 45 38 - 126 U/L   Total Bilirubin 0.4 0.3 - 1.2 mg/dL   GFR calc non Af Amer >60 >60 mL/min   GFR calc Af Amer >60 >60 mL/min   Anion gap 8 5 - 15  Uric acid     Status: Abnormal   Collection Time: 12/25/16  4:34 AM  Result Value Ref Range   Uric Acid, Serum 6.8 (H) 2.3 - 6.6 mg/dL  Lactate dehydrogenase     Status: None   Collection Time: 12/25/16  4:34 AM  Result Value Ref Range   LDH 136 98 - 192 U/L   No results found.  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 Does the need for close, 24 hr/day medical supervision in concert with the patient's rehab needs  make it unreasonable for this patient to be served in a less intensive setting? Yes 2. Co-Morbidities requiring supervision/potential complications: Pathologic left rib fractures, right renal mass 3. Due to bladder management, bowel management, safety, skin/wound care, disease management, medication administration, pain management and patient education, does the patient require 24 hr/day rehab nursing? Yes 4. Does the patient require coordinated care of a physician, rehab nurse, PT (1-2 hrs/day, 5 days/week) and OT (1-2 hrs/day, 5 days/week) to address physical and functional deficits in the context of the above medical diagnosis(es)? Yes Addressing deficits in the following areas: balance, endurance, locomotion, strength, transferring, bowel/bladder control, bathing, dressing, feeding, grooming, toileting and psychosocial support 5. Can the patient actively participate in an intensive therapy program of at least 3 hrs of therapy per day at least 5 days per week? Yes 6. The potential for patient to make measurable gains while on inpatient rehab is fair 7. Anticipated functional outcomes upon discharge from inpatient rehab are supervision  with PT, supervision with OT, n/a with SLP. 8. Estimated rehab length of stay to reach the above functional goals is: 10-12 days 9. Anticipated D/C setting: To [facility] for chemotherapy 10. Anticipated post D/C treatments: We will need acute care PT OT while at [place]. Overall Rehab/Functional Prognosis: fair  RECOMMENDATIONS: This patient's condition is appropriate for continued rehabilitative care in the following setting: CIR Patient has agreed to participate in recommended program. Yes Note that insurance prior authorization may be required for reimbursement for recommended care.  Comment: Please note that discharge plan would be to [facility] for chemotherapy and possible radiation therapy, goal of inpatient rehabilitation is to  increase strength and endurance as well as mobility to reduce comorbidities while patient is undergoing definitive treatment for her B cell lymphoma  Charlett Blake M.D. [facility] FAAPM&R (Sports Med, Neuromuscular Med) Diplomate Am Board of Electrodiagnostic Med  Flora Lipps 12/25/2016

## 2016-12-25 NOTE — Progress Notes (Signed)
Occupational Therapy Treatment Patient Details Name: Katrina [name] MRN: [id number] DOB: 04-03-1950 Today's Date: 12/25/2016    History of present illness Patient is 66 year old female with past medical history of hypertension who recently discharge from [facility] after being admitted with abdominal pain and LE paresthesias. Upon evaluation was found to have renal mass, retroperitoneal adenopathy. Patient was discharge for outpatient biopsy. Patient returned to [facility] ED on 12/18/16 c/o progressive LE weakness. Found to have T6-T8 cord compression. Patient was transferred to [facility] for emergent surgical decompression. Patient underwent laminectomy with tumor resection.    OT comments  Pt making progress towards OT goals this session. Pt reports 2/10 pain in back described as soreness and agreeable to OT intervention. Pt verbalized precautions with 100% accuracy this session and maintained throughout without cues to do so during functional tasks. Pt requires min multimodal cues for sequencing and safety with functional mobility secondary to decreased sensation in B LE's. Pt will continue to benefit from OT intervention to address functional deficits.   Follow Up Recommendations  CIR    Equipment Recommendations  Other (comment)(defer to next venue of care)    Recommendations for Other Services      Precautions / Restrictions Precautions Precautions: Fall;Back Precaution Comments: Reviewed 3/3 back precautions  Other Brace/Splint: No brace per MD order Restrictions Weight Bearing Restrictions: No       Mobility Bed Mobility Overal bed mobility: Needs Assistance Bed Mobility: Rolling;Sidelying to Sit Rolling: Min guard Sidelying to sit: Min guard       General bed mobility comments: from flat bed  Transfers Overall transfer level: Needs assistance Equipment used: Rolling walker (2 wheeled) Transfers: Sit to/from [facility] Sit to Stand: Min  assist Stand pivot transfers: Mod assist       General transfer comment: mod A for balance with min cues for RW advancement and sequencing    Balance Overall balance assessment: Needs assistance Sitting-balance support: Feet supported;No upper extremity supported Sitting balance-Leahy Scale: Good Sitting balance - Comments: Maintains sitting balance at EOB and on BSC   Standing balance support: During functional activity;Single extremity supported   Standing balance comment: Reliant on UE support         ADL either performed or assessed with clinical judgement   ADL Overall ADL's : Needs assistance/impaired     Grooming: Set up;Sitting;Oral care;Wash/dry hands;Wash/dry face;Brushing hair   Upper Body Bathing: Set up;Sitting Upper Body Bathing Details (indicate cue type and reason): while seated on commode chair              Toilet Transfer: Moderate assistance;Ambulation;RW;BSC;Cueing for safety;Cueing for sequencing Toilet Transfer Details (indicate cue type and reason): pt with decreased balance with reports of poor sensation and proprioception in B feet. Toileting- Clothing Manipulation and Hygiene: Minimal assistance;Cueing for safety;Cueing for sequencing;Sit to/from stand Toileting - Clothing Manipulation Details (indicate cue type and reason): Pt standing from [facility] with RW with min A for standing balance while pt performed hygiene       General ADL Comments: Pt verbalized 3/3 precautions accurately this session and maintaining with functional mobility trasks and self care.               Cognition Arousal/Alertness: Awake/alert Behavior During Therapy: WFL for tasks assessed/performed Overall Cognitive Status: Within Functional Limits for tasks assessed                      Pertinent Vitals/ Pain  Pain Assessment: 0-10 Pain Score: 2  Pain Location: Back Pain Descriptors / Indicators: Discomfort;Sore Pain Intervention(s): Limited activity  within patient's tolerance;Monitored during session   Progress Toward Goals  OT Goals(current goals can now be found in the care plan section)  Progress towards OT goals: Progressing toward goals  Acute Rehab OT Goals Patient Stated Goal: return to PLOF OT Goal Formulation: With patient Time For Goal Achievement: 01/08/17 Potential to Achieve Goals: Good  Plan Discharge plan remains appropriate    Co-evaluation    PT/OT/SLP Co-Evaluation/Treatment: Yes            AM-PAC PT "6 Clicks" Daily Activity     Outcome Measure   Help from another person eating meals?: None Help from another person taking care of personal grooming?: A Little Help from another person toileting, which includes using toliet, bedpan, or urinal?: A Little Help from another person bathing (including washing, rinsing, drying)?: A Lot Help from another person to put on and taking off regular upper body clothing?: A Little Help from another person to put on and taking off regular lower body clothing?: A Lot 6 Click Score: 17    End of Session Equipment Utilized During Treatment: Rolling walker;Other (comment)(BSC)  OT Visit Diagnosis: Unsteadiness on feet (R26.81);Other abnormalities of gait and mobility (R26.89);Muscle weakness (generalized) (M62.81);History of falling (Z91.81)   Activity Tolerance Patient tolerated treatment well   Patient Left in chair;with call bell/phone within reach;with chair alarm set   Nurse Communication          Time: [phone] OT Time Calculation (min): 20 min  Charges: OT General Charges $OT Visit: 1 Visit OT Treatments $Self Care/Home Management : 8-22 mins    Gypsy Decant 12/25/2016, 9:04 AM

## 2016-12-25 NOTE — Progress Notes (Signed)
Rehab admissions - I met with patient.  She hopes to admit to inpatient rehab for a couple weeks prior to going to [facility] to begin chemotherapy.  I have opened the case with [place] earlier today and have sent clinicals.  I have also spoken with insurance case Freight forwarder.  Not sure if we will hear back from insurance later today or in the am.  I will update all once I hear back from insurance case manager.  Call me for questions.  #[phone]

## 2016-12-25 NOTE — Progress Notes (Signed)
IR aware of request for [facility] placement.  Plans to start chemo possibly in 2-3 weeks.  We will follow and determine with our scheduling when [facility] placement is possible.  Marselino Slayton E 7:32 AM 12/25/2016

## 2016-12-26 ENCOUNTER — Other Ambulatory Visit: Payer: Self-pay

## 2016-12-26 ENCOUNTER — Encounter (HOSPITAL_COMMUNITY): Payer: Self-pay

## 2016-12-26 ENCOUNTER — Inpatient Hospital Stay (HOSPITAL_COMMUNITY)
Admission: RE | Admit: 2016-12-26 | Discharge: 2017-01-08 | DRG: 560 | Disposition: A | Discharge status: Home Health | Payer: Medicare Other | Source: Intra-hospital | Attending: Physical Medicine & Rehabilitation | Admitting: Physical Medicine & Rehabilitation

## 2016-12-26 DIAGNOSIS — M4714 Other spondylosis with myelopathy, thoracic region: Secondary | ICD-10-CM | POA: Diagnosis present

## 2016-12-26 DIAGNOSIS — D497 Neoplasm of unspecified behavior of endocrine glands and other parts of nervous system: Secondary | ICD-10-CM | POA: Diagnosis not present

## 2016-12-26 DIAGNOSIS — Z825 Family history of asthma and other chronic lower respiratory diseases: Secondary | ICD-10-CM | POA: Diagnosis not present

## 2016-12-26 DIAGNOSIS — Z833 Family history of diabetes mellitus: Secondary | ICD-10-CM

## 2016-12-26 DIAGNOSIS — Z8349 Family history of other endocrine, nutritional and metabolic diseases: Secondary | ICD-10-CM

## 2016-12-26 DIAGNOSIS — E871 Hypo-osmolality and hyponatremia: Secondary | ICD-10-CM | POA: Diagnosis present

## 2016-12-26 DIAGNOSIS — M8448XD Pathological fracture, other site, subsequent encounter for fracture with routine healing: Secondary | ICD-10-CM | POA: Diagnosis present

## 2016-12-26 DIAGNOSIS — C8332 Diffuse large B-cell lymphoma, intrathoracic lymph nodes: Secondary | ICD-10-CM | POA: Diagnosis present

## 2016-12-26 DIAGNOSIS — N179 Acute kidney failure, unspecified: Secondary | ICD-10-CM | POA: Diagnosis present

## 2016-12-26 DIAGNOSIS — G8220 Paraplegia, unspecified: Secondary | ICD-10-CM | POA: Diagnosis present

## 2016-12-26 DIAGNOSIS — D72828 Other elevated white blood cell count: Secondary | ICD-10-CM | POA: Diagnosis present

## 2016-12-26 DIAGNOSIS — Z4789 Encounter for other orthopedic aftercare: Secondary | ICD-10-CM | POA: Diagnosis present

## 2016-12-26 DIAGNOSIS — C8590 Non-Hodgkin lymphoma, unspecified, unspecified site: Secondary | ICD-10-CM

## 2016-12-26 DIAGNOSIS — Z87891 Personal history of nicotine dependence: Secondary | ICD-10-CM | POA: Diagnosis not present

## 2016-12-26 DIAGNOSIS — E860 Dehydration: Secondary | ICD-10-CM | POA: Diagnosis present

## 2016-12-26 DIAGNOSIS — Z8249 Family history of ischemic heart disease and other diseases of the circulatory system: Secondary | ICD-10-CM

## 2016-12-26 DIAGNOSIS — C7989 Secondary malignant neoplasm of other specified sites: Secondary | ICD-10-CM | POA: Diagnosis present

## 2016-12-26 DIAGNOSIS — C8330 Diffuse large B-cell lymphoma, unspecified site: Secondary | ICD-10-CM | POA: Diagnosis not present

## 2016-12-26 DIAGNOSIS — I10 Essential (primary) hypertension: Secondary | ICD-10-CM | POA: Diagnosis present

## 2016-12-26 DIAGNOSIS — G992 Myelopathy in diseases classified elsewhere: Secondary | ICD-10-CM | POA: Diagnosis present

## 2016-12-26 DIAGNOSIS — K5900 Constipation, unspecified: Secondary | ICD-10-CM | POA: Diagnosis present

## 2016-12-26 DIAGNOSIS — E876 Hypokalemia: Secondary | ICD-10-CM | POA: Diagnosis present

## 2016-12-26 DIAGNOSIS — Z800 Family history of malignant neoplasm of digestive organs: Secondary | ICD-10-CM | POA: Diagnosis not present

## 2016-12-26 DIAGNOSIS — C7951 Secondary malignant neoplasm of bone: Secondary | ICD-10-CM | POA: Diagnosis present

## 2016-12-26 DIAGNOSIS — G9520 Unspecified cord compression: Secondary | ICD-10-CM | POA: Diagnosis not present

## 2016-12-26 DIAGNOSIS — R609 Edema, unspecified: Secondary | ICD-10-CM | POA: Diagnosis not present

## 2016-12-26 DIAGNOSIS — D62 Acute posthemorrhagic anemia: Secondary | ICD-10-CM | POA: Diagnosis present

## 2016-12-26 LAB — HEPATITIS B SURFACE ANTIBODY,QUALITATIVE: HEP B S AB: NONREACTIVE

## 2016-12-26 LAB — HEPATITIS B SURFACE ANTIGEN: Hepatitis B Surface Ag: NEGATIVE

## 2016-12-26 LAB — HEPATITIS B CORE ANTIBODY, IGM: HEP B C IGM: NEGATIVE

## 2016-12-26 MED ORDER — POLYETHYLENE GLYCOL 3350 17 G PO PACK
17.0000 g | PACK | Freq: Every day | ORAL | Status: DC | PRN
  Administered 2016-12-29 – 2017-01-04 (×4): 17 g via ORAL
  Filled 2016-12-26 (×4): qty 1

## 2016-12-26 MED ORDER — ALUM & MAG HYDROXIDE-SIMETH 200-200-20 MG/5ML PO SUSP: 30.0000 mL | ORAL | Status: DC | PRN

## 2016-12-26 MED ORDER — FLEET ENEMA 7-19 GM/118ML RE ENEM
1.0000 | ENEMA | Freq: Once | RECTAL | Status: AC | PRN
  Administered 2016-12-29: 1 via RECTAL
  Filled 2016-12-26: qty 1

## 2016-12-26 MED ORDER — BISACODYL 10 MG RE SUPP
10.0000 mg | Freq: Every day | RECTAL | Status: DC | PRN
  Filled 2016-12-26: qty 1

## 2016-12-26 MED ORDER — POLYSACCHARIDE IRON COMPLEX 150 MG PO CAPS
150.0000 mg | ORAL_CAPSULE | Freq: Two times a day (BID) | ORAL | Status: DC
  Administered 2016-12-26 – 2017-01-07 (×24): 150 mg via ORAL
  Filled 2016-12-26 (×24): qty 1

## 2016-12-26 MED ORDER — PROCHLORPERAZINE MALEATE 5 MG PO TABS: 5.0000 mg | ORAL_TABLET | Freq: Four times a day (QID) | ORAL | Status: DC | PRN

## 2016-12-26 MED ORDER — DOCUSATE SODIUM 100 MG PO CAPS
100.0000 mg | ORAL_CAPSULE | Freq: Two times a day (BID) | ORAL | Status: DC
  Administered 2016-12-26 – 2017-01-08 (×25): 100 mg via ORAL
  Filled 2016-12-26 (×27): qty 1

## 2016-12-26 MED ORDER — TRIAMTERENE-HCTZ 37.5-25 MG PO CAPS
1.0000 | ORAL_CAPSULE | Freq: Every day | ORAL | Status: DC
  Administered 2016-12-27 – 2017-01-08 (×12): 1 via ORAL
  Filled 2016-12-26 (×14): qty 1

## 2016-12-26 MED ORDER — AMLODIPINE BESYLATE 10 MG PO TABS
10.0000 mg | ORAL_TABLET | Freq: Every day | ORAL | Status: DC
  Administered 2016-12-27 – 2017-01-08 (×12): 10 mg via ORAL
  Filled 2016-12-26 (×12): qty 1

## 2016-12-26 MED ORDER — ENOXAPARIN SODIUM 40 MG/0.4ML ~~LOC~~ SOLN
40.0000 mg | SUBCUTANEOUS | Status: AC
  Administered 2016-12-26 – 2016-12-29 (×4): 40 mg via SUBCUTANEOUS
  Filled 2016-12-26 (×4): qty 0.4

## 2016-12-26 MED ORDER — TRAZODONE HCL 50 MG PO TABS
25.0000 mg | ORAL_TABLET | Freq: Every evening | ORAL | Status: DC | PRN
  Administered 2016-12-26 – 2017-01-07 (×10): 50 mg via ORAL
  Filled 2016-12-26 (×11): qty 1

## 2016-12-26 MED ORDER — FAMOTIDINE 20 MG PO TABS
20.0000 mg | ORAL_TABLET | Freq: Every day | ORAL | Status: DC
  Administered 2016-12-26 – 2017-01-08 (×13): 20 mg via ORAL
  Filled 2016-12-26 (×13): qty 1

## 2016-12-26 MED ORDER — DIPHENHYDRAMINE HCL 12.5 MG/5ML PO ELIX
12.5000 mg | ORAL_SOLUTION | Freq: Four times a day (QID) | ORAL | Status: DC | PRN
Start: 2016-12-26 — End: 2017-01-08

## 2016-12-26 MED ORDER — PROCHLORPERAZINE EDISYLATE 5 MG/ML IJ SOLN: 5.0000 mg | Freq: Four times a day (QID) | INTRAMUSCULAR | Status: DC | PRN

## 2016-12-26 MED ORDER — CYCLOBENZAPRINE HCL 10 MG PO TABS
10.0000 mg | ORAL_TABLET | Freq: Three times a day (TID) | ORAL | Status: DC | PRN
  Administered 2016-12-27 – 2016-12-28 (×3): 10 mg via ORAL
  Filled 2016-12-26 (×3): qty 1

## 2016-12-26 MED ORDER — ACETAMINOPHEN 325 MG PO TABS
325.0000 mg | ORAL_TABLET | ORAL | Status: DC | PRN
  Administered 2016-12-27 – 2016-12-28 (×3): 650 mg via ORAL
  Filled 2016-12-26 (×3): qty 2

## 2016-12-26 MED ORDER — GUAIFENESIN-DM 100-10 MG/5ML PO SYRP: 5.0000 mL | ORAL_SOLUTION | Freq: Four times a day (QID) | ORAL | Status: DC | PRN

## 2016-12-26 MED ORDER — PROCHLORPERAZINE 25 MG RE SUPP
12.5000 mg | Freq: Four times a day (QID) | RECTAL | Status: DC | PRN
  Filled 2016-12-26: qty 1

## 2016-12-26 MED ORDER — CYCLOBENZAPRINE HCL 10 MG PO TABS: 10.0000 mg | ORAL_TABLET | Freq: Three times a day (TID) | ORAL | 0 refills | Status: DC | PRN

## 2016-12-26 MED ORDER — DEXAMETHASONE 6 MG PO TABS
10.0000 mg | ORAL_TABLET | Freq: Every day | ORAL | Status: DC
  Administered 2016-12-27: 10 mg via ORAL
  Filled 2016-12-26: qty 1

## 2016-12-26 NOTE — Discharge Summary (Signed)
Physician Discharge Summary  Patient ID: Katrina [name] MRN: [id number] DOB/AGE: 10/19/1950 66 y.o.  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 The patient tolerated the procedure well and was taken to the recovery room and then to the floor in stable condition. The hospital course was routine. There were no complications. The wound remained clean dry and intact. Pt had appropriate back soreness. No complaints of new pain or new N/T/W. The patient remained afebrile with stable vital signs, and tolerated a regular diet. The patient continued to increase activities, and pain was well controlled with oral pain medications.   Consults: oncology  Significant Diagnostic Studies:  Results for orders placed or performed during the hospital encounter of 12/18/16  Urine culture  Result Value Ref Range   Specimen Description URINE, CATHETERIZED    Special Requests NONE    Culture      NO GROWTH Performed at [facility], [address], [place],  [address]    Report Status 12/20/2016 FINAL   Urinalysis, Routine w reflex microscopic  Result Value Ref Range   Color, Urine YELLOW YELLOW   APPearance CLEAR CLEAR   Specific Gravity, Urine 1.013 1.005 - 1.030   pH 6.0 5.0 - 8.0   Glucose, UA NEGATIVE NEGATIVE mg/dL   Hgb urine dipstick NEGATIVE NEGATIVE   Bilirubin Urine NEGATIVE NEGATIVE   Ketones, ur NEGATIVE NEGATIVE mg/dL   Protein, ur NEGATIVE NEGATIVE mg/dL   Nitrite NEGATIVE NEGATIVE   Leukocytes, UA NEGATIVE NEGATIVE  Comprehensive metabolic panel  Result Value Ref Range   Sodium 136 135 - 145 mmol/L   Potassium  3.8 3.5 - 5.1 mmol/L   Chloride 99 (L) 101 - 111 mmol/L   CO2 27 22 - 32 mmol/L   Glucose, Bld 118 (H) 65 - 99 mg/dL   BUN 18 6 - 20 mg/dL   Creatinine, Ser 0.93 0.44 - 1.00 mg/dL   Calcium 9.5 8.9 - 10.3 mg/dL   Total Protein 7.7 6.5 - 8.1 g/dL   Albumin 3.8 3.5 - 5.0 g/dL   AST 30 15 - 41 U/L   ALT 26 14 - 54 U/L   Alkaline Phosphatase 66 38 - 126 U/L   Total Bilirubin 0.3 0.3 - 1.2 mg/dL   GFR calc non Af Amer >60 >60 mL/min   GFR calc Af Amer >60 >60 mL/min   Anion gap 10 5 - 15  CBC with Differential  Result Value Ref Range   WBC 9.6 4.0 - 10.5 K/uL   RBC 3.76 (L) 3.87 - 5.11 MIL/uL   Hemoglobin 10.3 (L) 12.0 - 15.0 g/dL   HCT 31.6 (L) 36.0 - 46.0 %   MCV 84.0 78.0 - 100.0 fL   MCH 27.4 26.0 - 34.0 pg   MCHC 32.6 30.0 - 36.0 g/dL   RDW 14.8 11.5 - 15.5 %   Platelets 365 150 - 400 K/uL   Neutrophils Relative % 77 %   Neutro Abs 7.4 1.7 - 7.7 K/uL   Lymphocytes Relative 11 %   Lymphs Abs 1.0 0.7 - 4.0 K/uL   Monocytes Relative 10 %   Monocytes Absolute 1.0 0.1 - 1.0 K/uL   Eosinophils Relative  2 %   Eosinophils Absolute 0.2 0.0 - 0.7 K/uL   Basophils Relative 0 %   Basophils Absolute 0.0 0.0 - 0.1 K/uL  Troponin I  Result Value Ref Range   Troponin I <0.03 <0.03 ng/mL  Lactic acid, plasma  Result Value Ref Range   Lactic Acid, Venous 1.3 0.5 - 1.9 mmol/L  Lactic acid, plasma  Result Value Ref Range   Lactic Acid, Venous 1.3 0.5 - 1.9 mmol/L  Protime-INR  Result Value Ref Range   Prothrombin Time 13.8 11.4 - 15.2 seconds   INR 1.07   Urinalysis, Routine w reflex microscopic  Result Value Ref Range   Color, Urine YELLOW YELLOW   APPearance CLOUDY (A) CLEAR   Specific Gravity, Urine 1.021 1.005 - 1.030   pH 5.0 5.0 - 8.0   Glucose, UA NEGATIVE NEGATIVE mg/dL   Hgb urine dipstick NEGATIVE NEGATIVE   Bilirubin Urine NEGATIVE NEGATIVE   Ketones, ur NEGATIVE NEGATIVE mg/dL   Protein, ur NEGATIVE NEGATIVE mg/dL   Nitrite NEGATIVE NEGATIVE   Leukocytes, UA  SMALL (A) NEGATIVE   RBC / HPF 0-5 0 - 5 RBC/hpf   WBC, UA 0-5 0 - 5 WBC/hpf   Bacteria, UA RARE (A) NONE SEEN   Squamous Epithelial / LPF 6-30 (A) NONE SEEN   Trans Epithel, UA 1    Mucus PRESENT   CBC  Result Value Ref Range   WBC 13.9 (H) 4.0 - 10.5 K/uL   RBC 3.17 (L) 3.87 - 5.11 MIL/uL   Hemoglobin 8.4 (L) 12.0 - 15.0 g/dL   HCT 26.3 (L) 36.0 - 46.0 %   MCV 83.0 78.0 - 100.0 fL   MCH 26.5 26.0 - 34.0 pg   MCHC 31.9 30.0 - 36.0 g/dL   RDW 14.6 11.5 - 15.5 %   Platelets 346 150 - 400 K/uL  Vitamin B12  Result Value Ref Range   Vitamin B-12 515 180 - 914 pg/mL  Folate  Result Value Ref Range   Folate 18.0 >5.9 ng/mL  Iron and TIBC  Result Value Ref Range   Iron 26 (L) 28 - 170 ug/dL   TIBC 298 250 - 450 ug/dL   Saturation Ratios 9 (L) 10.4 - 31.8 %   UIBC 272 ug/dL  Ferritin  Result Value Ref Range   Ferritin 243 11 - 307 ng/mL  Reticulocytes  Result Value Ref Range   Retic Ct Pct 3.7 (H) 0.4 - 3.1 %   RBC. 3.17 (L) 3.87 - 5.11 MIL/uL   Retic Count, Absolute 117.3 19.0 - 186.0 K/uL  CBC with Differential/Platelet  Result Value Ref Range   WBC 13.0 (H) 4.0 - 10.5 K/uL   RBC 3.70 (L) 3.87 - 5.11 MIL/uL   Hemoglobin 9.8 (L) 12.0 - 15.0 g/dL   HCT 30.4 (L) 36.0 - 46.0 %   MCV 82.2 78.0 - 100.0 fL   MCH 26.5 26.0 - 34.0 pg   MCHC 32.2 30.0 - 36.0 g/dL   RDW 14.8 11.5 - 15.5 %   Platelets 399 150 - 400 K/uL   Neutrophils Relative % 80 %   Neutro Abs 10.4 (H) 1.7 - 7.7 K/uL   Lymphocytes Relative 11 %   Lymphs Abs 1.4 0.7 - 4.0 K/uL   Monocytes Relative 9 %   Monocytes Absolute 1.2 (H) 0.1 - 1.0 K/uL   Eosinophils Relative 0 %   Eosinophils Absolute 0.0 0.0 - 0.7 K/uL   Basophils Relative 0 %   Basophils Absolute 0.0  0.0 - 0.1 K/uL  Comprehensive metabolic panel  Result Value Ref Range   Sodium 134 (L) 135 - 145 mmol/L   Potassium 4.0 3.5 - 5.1 mmol/L   Chloride 98 (L) 101 - 111 mmol/L   CO2 28 22 - 32 mmol/L   Glucose, Bld 118 (H) 65 - 99 mg/dL   BUN 29  (H) 6 - 20 mg/dL   Creatinine, Ser 0.92 0.44 - 1.00 mg/dL   Calcium 8.5 (L) 8.9 - 10.3 mg/dL   Total Protein 6.0 (L) 6.5 - 8.1 g/dL   Albumin 3.0 (L) 3.5 - 5.0 g/dL   AST 22 15 - 41 U/L   ALT 29 14 - 54 U/L   Alkaline Phosphatase 45 38 - 126 U/L   Total Bilirubin 0.4 0.3 - 1.2 mg/dL   GFR calc non Af Amer >60 >60 mL/min   GFR calc Af Amer >60 >60 mL/min   Anion gap 8 5 - 15  Uric acid  Result Value Ref Range   Uric Acid, Serum 6.8 (H) 2.3 - 6.6 mg/dL  Lactate dehydrogenase  Result Value Ref Range   LDH 136 98 - 192 U/L  Hepatitis B surface antibody  Result Value Ref Range   Hep B S Ab Non Reactive   Hepatitis B surface antigen  Result Value Ref Range   Hepatitis B Surface Ag Negative Negative  Hepatitis B core antibody, IgM  Result Value Ref Range   Hep B C IgM Negative Negative  ECHOCARDIOGRAM COMPLETE  Result Value Ref Range   Weight 4,024 oz   Height 68 in   BP 131/66 mmHg  Prepare RBC  Result Value Ref Range   Order Confirmation ORDER PROCESSED BY BLOOD BANK   Type and screen [place]  Result Value Ref Range   ABO/RH(D) O NEG    Antibody Screen NEG    Sample Expiration 12/21/2016    Unit Number [id number]    Blood Component Type RBC LR PHER2    Unit division 00    Status of Unit REL FROM [facility]    Transfusion Status OK TO TRANSFUSE    Crossmatch Result Compatible    Unit Number [id number]    Blood Component Type RED CELLS,LR    Unit division 00    Status of Unit REL FROM [facility]    Transfusion Status OK TO TRANSFUSE    Crossmatch Result Compatible   ABO/Rh  Result Value Ref Range   ABO/RH(D) O NEG   BPAM RBC  Result Value Ref Range   ISSUE DATE / TIME 160109323557    Blood Product Unit Number [id number]    PRODUCT CODE [id number]    Unit Type and Rh 9500    Blood Product Expiration Date 761607371062    ISSUE DATE / TIME [id number]    Blood Product Unit Number [id number]    Unit Type and Rh 9500    Blood Product  Expiration Date [id number]     Dg Chest 2 View  Result Date: 12/18/2016 CLINICAL DATA:  Weakness. EXAM: CHEST  2 VIEW COMPARISON:  Chest x-ray dated December 12, 2016. FINDINGS: The cardiomediastinal silhouette is normal in size. Normal pulmonary vascularity. No consolidation, pleural effusion, or pneumothorax. 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 Unchanged fracture of the left lateral fourth rib. IMPRESSION: Unchanged left extrapleural soft tissue mass with destruction of the left fifth rib and nondisplaced fracture of the left lateral fourth rib. Electronically Signed   By:  Titus Dubin M.D.   On: 12/18/2016 17:54   Dg Chest 2 View  Result Date: 12/12/2016 CLINICAL DATA:  Chest and back pain.  Cough EXAM: CHEST  2 VIEW COMPARISON:  01/09/2010 FINDINGS: Fracture of the left fourth rib laterally. Destruction of the left fifth rib laterally with soft tissue mass. Heart size within normal limits. Negative for heart failure. No pleural effusion. Negative for pneumonia. IMPRESSION: Left extrapleural soft tissue mass with destruction of the left fifth rib and fracture left fourth rib. Findings worrisome for metastatic disease or myeloma. Correlate with any history of trauma. CT chest with contrast recommended for further evaluation. Electronically Signed   By: Franchot Gallo M.D.   On: 12/12/2016 13:05   Dg Thoracic Spine 2 View  Result Date: 12/19/2016 CLINICAL DATA:  Decompression T5 through T8 thoracic. EXAM: THORACIC SPINE 2 VIEWS COMPARISON:  Limited thoracic spine MRI earlier this day FINDINGS: Four AP views of the chest/thoracic spine obtained during thoracic surgery. Initial image demonstrates surgical instrument localizing to C7. Second image was surgical instrument localizing to T4-T5. Subsequent images with surgical instruments in place. No hardware visualized on provided images. An endotracheal tube is present. IMPRESSION: Intra procedural  surgical localization during thoracic spine surgery. Please reference operative report for details. Electronically Signed   By: Jeb Levering M.D.   On: 12/19/2016 00:50   Ct Head Wo Contrast  Result Date: 12/12/2016 CLINICAL DATA:  Patient complaining of pain between the shoulder blades for 2 months. EXAM: CT HEAD WITHOUT CONTRAST TECHNIQUE: Contiguous axial images were obtained from the base of the skull through the vertex without intravenous contrast. COMPARISON:  None. FINDINGS: Brain: No evidence of acute infarction, hemorrhage, hydrocephalus, extra-axial collection or mass lesion/mass effect. Vascular: No hyperdense vessel or unexpected calcification. Skull: Normal. Negative for fracture or focal lesion. Sinuses/Orbits: No acute finding. Other: None. IMPRESSION: No focal acute intracranial abnormality identified. Electronically Signed   By: Abelardo Diesel M.D.   On: 12/12/2016 21:47   Ct Chest W Contrast  Result Date: 12/12/2016 CLINICAL DATA:  Upper back pain for the past 2 months, increasing. Persistent cough. Epigastric and right upper quadrant abdominal pain. Shooting pain in the upper right thigh. Left extrapleural soft tissue mass with rib destruction on chest radiographs earlier today. EXAM: CT CHEST, ABDOMEN, AND PELVIS WITH CONTRAST TECHNIQUE: Multidetector CT imaging of the chest, abdomen and pelvis was performed following the standard protocol during bolus administration of intravenous contrast. CONTRAST:  168mL ISOVUE-300 IOPAMIDOL (ISOVUE-300) INJECTION 61% COMPARISON:  None. Chest radiographs dated 12/12/2016. FINDINGS: CT CHEST FINDINGS Cardiovascular: Normal sized heart. Atheromatous arterial calcifications, including the coronary arteries and aorta. Mediastinum/Nodes: Enlarged AP window and left hilar lymph nodes. An AP window node has a short axis diameter of 14 mm on image number 25 series 2. A left hilar node has a short axis diameter of 21 mm on image number 30 series 2. There  are multiple additional enlarged left hilar nodes. No enlarged right hilar nodes. There are 2 enlarged left axillary nodes. The more anterior node has a short axis diameter of 17 mm on image number 17 series 2 and the more posterior node has a short axis diameter 17 mm on image number 30 series 2. There are multiple enlarged left supraclavicular nodes. The largest has a short axis diameter of 15 mm on image number 8 series 2. A left superior mediastinal node has a short axis diameter of 10 mm on image number 14 series 2. Normal appearing thyroid gland.  Lungs/Pleura: There are multiple left upper lobe nodules. The majority are adjacent to a left chest wall mass. The largest of these has a maximum diameter of 1.7 cm on image number 58 series 4. A parenchymal nodule has a diameter 1.0 cm on image number 58 series for and an additional adjacent parenchymal nodule has a diameter of 1.0 cm on image number 60 series 4. More centrally in the left upper lobe, there are 2 adjacent nodules. One measures 2.0 cm on image number 56 series 4 and the other measures 1.4 cm on image number 57 series 4. There are mild bronchiectatic changes in the medial aspect of the right lower lobe. No lung nodules are seen no other than the left upper lobe nodules. Musculoskeletal: There are permeative destructive changes involving the posterior, lateral and anterior portions of the left fifth rib with associated lateral and posterior chest wall masses. There is also an anterior chest wall mass at the location of the left fourth costal cartilage. There is also a mildly displaced fracture of the left fourth rib laterally. The largest chest wall mass is located laterally, with a maximum thickness of 5.2 cm on image number 28 series 2. This involves a large portion of the left fifth rib. A posterior chest wall mass measures 1.7 cm in thickness on image number 18 series 2 and an anterior chest wall mass measures 2.0 cm in thickness on image number 40  series 2. Also noted are thoracic spine degenerative changes. CT ABDOMEN PELVIS FINDINGS Hepatobiliary: Mild sludge in the gallbladder. Normal appearing liver. Pancreas: Unremarkable. No pancreatic ductal dilatation or surrounding inflammatory changes. Spleen: Normal in size without focal abnormality. Adrenals/Urinary Tract: Normal appearing adrenal glands. 3.2 x 3.1 cm solid mass in the posterior aspect of the mid right kidney on image number 71 series 2. Stomach/Bowel: Multiple colonic diverticula, most numerous in the sigmoid and descending colon. Unremarkable stomach and small bowel. No evidence of appendicitis. Vascular/Lymphatic: Multiple enlarged mesenteric and retroperitoneal lymph nodes. These include 2 large areas of confluent adenopathy in the mesentery at the level of the upper pelvis. The more central mass measures 6.5 x 5.7 cm on image number 96 series 2 and the mass on the right measures 8.0 x 3.7 cm on the same image. The largest more proximal mesenteric node has a short axis diameter of 2.7 cm on image number 68 series 2. The largest retroperitoneal node is medial to the left kidney in the para-aortic region and measures 3.5 cm in short axis diameter on image number 69 series 2. Reproductive: Status post hysterectomy. No adnexal masses. Other: Small amount of free peritoneal fluid. Musculoskeletal: Lumbar lower thoracic spine degenerative changes. These include facet degenerative changes with associated grade 1 anterolisthesis at the L4-5 level. No evidence of bony metastatic disease in the abdomen or pelvis. IMPRESSION: 1. Extensive mesenteric, retroperitoneal, left hilar, mediastinal, left supraclavicular and left axillary adenopathy. Differential considerations include metastatic adenopathy, lymphoma and leukemia. The size of the nodes in the mesentery and retroperitoneum are suggestive of non-Hodgkin's lymphoma. 2. Multiple left upper lobe nodules and left chest wall metastases, primarily  arising from the left fifth rib. Differential considerations include metastatic disease and lymphoma. A left upper lobe primary lung carcinoma is a possibility. 3. 3.2 cm solid right renal mass. Differential considerations include renal cell carcinoma and oncocytoma. 4. Small amount of free peritoneal fluid, most likely due to lymphatic obstruction by the mesenteric adenopathy. 5. Left fourth rib fracture. 6. Colonic diverticulosis. 7. Calcific coronary  artery and aortic atherosclerosis. Aortic Atherosclerosis (ICD10-I70.0). Electronically Signed   By: Claudie Revering M.D.   On: 12/12/2016 19:19   Ct Abdomen Pelvis W Contrast  Result Date: 12/12/2016 CLINICAL DATA:  Upper back pain for the past 2 months, increasing. Persistent cough. Epigastric and right upper quadrant abdominal pain. Shooting pain in the upper right thigh. Left extrapleural soft tissue mass with rib destruction on chest radiographs earlier today. EXAM: CT CHEST, ABDOMEN, AND PELVIS WITH CONTRAST TECHNIQUE: Multidetector CT imaging of the chest, abdomen and pelvis was performed following the standard protocol during bolus administration of intravenous contrast. CONTRAST:  154mL ISOVUE-300 IOPAMIDOL (ISOVUE-300) INJECTION 61% COMPARISON:  None. Chest radiographs dated 12/12/2016. FINDINGS: CT CHEST FINDINGS Cardiovascular: Normal sized heart. Atheromatous arterial calcifications, including the coronary arteries and aorta. Mediastinum/Nodes: Enlarged AP window and left hilar lymph nodes. An AP window node has a short axis diameter of 14 mm on image number 25 series 2. A left hilar node has a short axis diameter of 21 mm on image number 30 series 2. There are multiple additional enlarged left hilar nodes. No enlarged right hilar nodes. There are 2 enlarged left axillary nodes. The more anterior node has a short axis diameter of 17 mm on image number 17 series 2 and the more posterior node has a short axis diameter 17 mm on image number 30 series 2.  There are multiple enlarged left supraclavicular nodes. The largest has a short axis diameter of 15 mm on image number 8 series 2. A left superior mediastinal node has a short axis diameter of 10 mm on image number 14 series 2. Normal appearing thyroid gland. Lungs/Pleura: There are multiple left upper lobe nodules. The majority are adjacent to a left chest wall mass. The largest of these has a maximum diameter of 1.7 cm on image number 58 series 4. A parenchymal nodule has a diameter 1.0 cm on image number 58 series for and an additional adjacent parenchymal nodule has a diameter of 1.0 cm on image number 60 series 4. More centrally in the left upper lobe, there are 2 adjacent nodules. One measures 2.0 cm on image number 56 series 4 and the other measures 1.4 cm on image number 57 series 4. There are mild bronchiectatic changes in the medial aspect of the right lower lobe. No lung nodules are seen no other than the left upper lobe nodules. Musculoskeletal: There are permeative destructive changes involving the posterior, lateral and anterior portions of the left fifth rib with associated lateral and posterior chest wall masses. There is also an anterior chest wall mass at the location of the left fourth costal cartilage. There is also a mildly displaced fracture of the left fourth rib laterally. The largest chest wall mass is located laterally, with a maximum thickness of 5.2 cm on image number 28 series 2. This involves a large portion of the left fifth rib. A posterior chest wall mass measures 1.7 cm in thickness on image number 18 series 2 and an anterior chest wall mass measures 2.0 cm in thickness on image number 40 series 2. Also noted are thoracic spine degenerative changes. CT ABDOMEN PELVIS FINDINGS Hepatobiliary: Mild sludge in the gallbladder. Normal appearing liver. Pancreas: Unremarkable. No pancreatic ductal dilatation or surrounding inflammatory changes. Spleen: Normal in size without focal  abnormality. Adrenals/Urinary Tract: Normal appearing adrenal glands. 3.2 x 3.1 cm solid mass in the posterior aspect of the mid right kidney on image number 71 series 2. Stomach/Bowel: Multiple colonic diverticula,  most numerous in the sigmoid and descending colon. Unremarkable stomach and small bowel. No evidence of appendicitis. Vascular/Lymphatic: Multiple enlarged mesenteric and retroperitoneal lymph nodes. These include 2 large areas of confluent adenopathy in the mesentery at the level of the upper pelvis. The more central mass measures 6.5 x 5.7 cm on image number 96 series 2 and the mass on the right measures 8.0 x 3.7 cm on the same image. The largest more proximal mesenteric node has a short axis diameter of 2.7 cm on image number 68 series 2. The largest retroperitoneal node is medial to the left kidney in the para-aortic region and measures 3.5 cm in short axis diameter on image number 69 series 2. Reproductive: Status post hysterectomy. No adnexal masses. Other: Small amount of free peritoneal fluid. Musculoskeletal: Lumbar lower thoracic spine degenerative changes. These include facet degenerative changes with associated grade 1 anterolisthesis at the L4-5 level. No evidence of bony metastatic disease in the abdomen or pelvis. IMPRESSION: 1. Extensive mesenteric, retroperitoneal, left hilar, mediastinal, left supraclavicular and left axillary adenopathy. Differential considerations include metastatic adenopathy, lymphoma and leukemia. The size of the nodes in the mesentery and retroperitoneum are suggestive of non-Hodgkin's lymphoma. 2. 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 Differential considerations include metastatic disease and lymphoma. A left upper lobe primary lung carcinoma is a possibility. 3. 3.2 cm solid right renal mass. Differential considerations include renal cell carcinoma and oncocytoma. 4. Small amount of free  peritoneal fluid, most likely due to lymphatic obstruction by the mesenteric adenopathy. 5. Left fourth rib fracture. 6. Colonic diverticulosis. 7. Calcific coronary artery and aortic atherosclerosis. Aortic Atherosclerosis (ICD10-I70.0). Electronically Signed   By: Claudie Revering M.D.   On: 12/12/2016 19:19   Mr Thoracic Spine Limited Wo Contrast  Result Date: 12/18/2016 CLINICAL DATA:  Bilateral lower extremity weakness. Status post fall yesterday. The patient has a history of carcinoma of unknown primary. EXAM: MRI THORACIC SPINE WITHOUT CONTRAST TECHNIQUE: Multiplanar, multisequence MR imaging of the thoracic spine was performed. No intravenous contrast was administered. COMPARISON:  CT chest 12/12/2016. FINDINGS: The patient could only tolerate scanning for a T2 weighted sagittal sequence and scout imaging. The T7 vertebral body is replaced with tumor. Extensive epidural tumor is seen eccentric to the right extending cephalad into the T6-7 neural foramen on the right. Tumor also extends posteriorly and compresses the cord. The T7-8 foramen is also filled with tumor. No other evidence of epidural tumor is identified. IMPRESSION: Very limited examination demonstrating compression of the cord from approximately mid T6 to T7-8 by epidural tumor eccentric to the right and surrounding the cord both anteriorly and posteriorly. Tumor fills the right neural foramina at T6-7 and T7-8 and completely replaces the T7 vertebral body. These results were called by telephone at the time of interpretation on 12/18/2016 at 5:14 pm to Dr. Francine Graven , who verbally acknowledged these results. Electronically Signed   By: Inge Rise M.D.   On: 12/18/2016 17:20    Antibiotics:  Anti-infectives (From admission, onward)   Start     Dose/Rate Route Frequency Ordered Stop   12/19/16 0230  ceFAZolin (ANCEF) IVPB 2g/100 mL premix     2 g 200 mL/hr over 30 Minutes Intravenous Every 8 hours 12/19/16 0212 12/20/16 1740    12/18/16 2219  bacitracin 50,000 Units in sodium chloride irrigation 0.9 % 500 mL irrigation  Status:  Discontinued       As needed 12/18/16  2219 12/18/16 2355      Discharge Exam: Blood pressure (!) 154/78, pulse 66, temperature 98.1 F (36.7 C), temperature source Oral, resp. rate 20, height 5\' 8"  (1.727 m), weight 114.1 kg (251 lb 8 oz), SpO2 98 %. Neurologic: Grossly normal Ambulating and voiding well  Discharge Medications:   Allergies as of 12/26/2016      Reactions   Contrast Media [iodinated Diagnostic Agents] Itching      Medication List    TAKE these medications   amLODipine 10 MG tablet Commonly known as:  NORVASC Take 1 tablet (10 mg total) daily by mouth.   cyclobenzaprine 10 MG tablet Commonly known as:  FLEXERIL Take 1 tablet (10 mg total) 3 (three) times daily as needed by mouth for muscle spasms.   docusate sodium 100 MG capsule Commonly known as:  COLACE Take 100 mg by mouth daily.   ibuprofen 400 MG tablet Commonly known as:  ADVIL,MOTRIN Take 1 tablet (400 mg total) by mouth every 8 (eight) hours as needed for moderate pain.   oxyCODONE-acetaminophen 5-325 MG tablet Commonly known as:  PERCOCET/ROXICET Take 1 tablet by mouth every 6 (six) hours as needed for severe pain.   triamterene-hydrochlorothiazide 37.5-25 MG capsule Commonly known as:  DYAZIDE Take 1 capsule by mouth daily.       Disposition: CIR   Final Dx: same as admitting  Discharge Instructions    Call MD for:  difficulty breathing, headache or visual disturbances   Complete by:  As directed    Call MD for:  extreme fatigue   Complete by:  As directed    Call MD for:  hives   Complete by:  As directed    Call MD for:  persistant dizziness or light-headedness   Complete by:  As directed    Call MD for:  persistant nausea and vomiting   Complete by:  As directed    Call MD for:  redness, tenderness, or signs of infection (pain, swelling, redness, odor or green/yellow  discharge around incision site)   Complete by:  As directed    Call MD for:  severe uncontrolled pain   Complete by:  As directed    Call MD for:  temperature >100.4   Complete by:  As directed    Diet - low sodium heart healthy   Complete by:  As directed    Increase activity slowly   Complete by:  As directed          Signed: Ocie Cornfield  12/26/2016, 10:34 AM

## 2016-12-26 NOTE — Progress Notes (Signed)
PMR Admission Coordinator Pre-Admission Assessment  Patient: Katrina [name] is an 66 y.o., female MRN: [id number] DOB: [birth year] Height:   Weight:               Insurance Information HMO: Yes    PPO:       PCP:       IPA:       80/20:       OTHER:   PRIMARYLynda Rainwater Medicare      Policy#: [id number]      Subscriber:  Christeen Douglas CM Name: Vevelyn Royals      Phone#: [phone]     Fax#: [phone] Pre-Cert#:  [id number] with weekly updates      Employer:  Retired Benefits:  Phone #: [phone]     Name:  Hanley Seamen. Date: 12/19/16     Deduct:  $0      Out of Pocket Max: $6700 Met $0)      Life Max: N/A CIR: $430 days 1-4      SNF: $0 days 1-20; $160 days 21-62; $0 days 63-100 Outpatient: medical necessity     Co-Pay: $40/visit Home Health: 100%      Co-Pay: none DME: 80%     Co-Pay: 20% Providers: in network  Medicaid Application Date:        Case Manager:   Disability Application Date:        Case Worker:    Emergency Facilities manager Information    Name Relation Home Work Mobile   [place] Son   [place] Sister [phone]     Yisroel Ramming [phone]       Current Medical History  Patient Admitting Diagnosis:  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 female who was admitted to [facility] on 10/25 - 12/14/16 with generalized weakness, back pain and BLE numbness/weakness with difficulty walking. She was found to have extensive diffuse lymphadenopathy, LUL and left chest wall metastases, right renal mass and pathologic left rib fractures. She was discharged to home with walker and plans to follow up with Hem/onc but continued to have progressive RLE weakness with foot drop and ascending numbness to lower abdomen. She was admitted on 12/18/16 for work up and  MRI thoracic spine revealed extensive epidural tumor eccentric to right and surrounding the cord from T6/7 to T7/8 with cord compression and  replacing T7 vertebral body.  She was evaluated by Dr. Saintclair Halsted and taken to OR the same day for decompressive thoracic laminectomy from T5-T8 for resection of tumor. Pathology revealed diffuse large B-cell lymphoma and Dr. Alvy Bimler consulted for input and recommend starting first round of chemo Nov 19th or sooner if doing well. Interventional radiology consulted to place [facility] and XRT to begin 2 weeks post op. Patient with paraplegia and significant deficits in mobility as well as ability to carry out ADL tasks. Patient lives alone and sedentary but was independent PTA. Sat up in chair all day yesterday and this am. Was staying with son due to weakness and plans to d/c there.  Patient feels that her sensation is starting to return on the left side, still feels numb in the right lower extremity as well as lower abdominal area.  States that she is able to control her bladder, has been constipated with bowels.  CIR recommended by rehab team.  Past Medical History  Past Medical History:  Diagnosis Date  . Anemia   . Arthritis   . Chest  wall mass 11/2016  . Hypertension   . Lymphadenopathy 11/2016   "extensive"  . Paresthesia of lower extremity 11/2016   bilateral  . Pulmonary nodules 11/2016  . Right renal mass 11/2016    Family History  family history includes CAD in her mother; COPD in her mother; Colon cancer in her brother, father, and sister; Diabetes Mellitus II in her mother and sister; Thyroid disease in her sister.  Prior Rehab/Hospitalizations: Had outpatient therapy after shoulder surgery in 2007  Has the patient had major surgery during 100 days prior to admission? No  Current Medications  No current facility-administered medications for this encounter.   Current Outpatient Medications:  .  amLODipine (NORVASC) 10 MG tablet, Take 1 tablet (10 mg total) daily by mouth., Disp: , Rfl:  .  cyclobenzaprine (FLEXERIL) 10 MG tablet, Take 1 tablet (10 mg total) 3 (three) times daily as needed  by mouth for muscle spasms., Disp: 30 tablet, Rfl: 0  Facility-Administered Medications Ordered in Other Encounters:  .  0.9 %  sodium chloride infusion, 250 mL, Intravenous, Continuous, Kary Kos, MD .  acetaminophen (TYLENOL) tablet 650 mg, 650 mg, Oral, Q4H PRN **OR** acetaminophen (TYLENOL) suppository 650 mg, 650 mg, Rectal, Q4H PRN, Kary Kos, MD .  alum & mag hydroxide-simeth (MAALOX/MYLANTA) 200-200-20 MG/5ML suspension 30 mL, 30 mL, Oral, Q6H PRN, Kary Kos, MD .  amLODipine (NORVASC) tablet 10 mg, 10 mg, Oral, Daily, Patrecia Pour, Christean Grief, MD, 10 mg at 12/26/16 0903 .  cyclobenzaprine (FLEXERIL) tablet 10 mg, 10 mg, Oral, TID PRN, Kary Kos, MD, 10 mg at 12/26/16 0903 .  dexamethasone (DECADRON) injection 10 mg, 10 mg, Intravenous, Q24H, Patrecia Pour, Christean Grief, MD, 10 mg at 12/26/16 0903 .  docusate sodium (COLACE) capsule 100 mg, 100 mg, Oral, Daily, Kary Kos, MD, 100 mg at 12/26/16 0903 .  ferrous sulfate tablet 325 mg, 325 mg, Oral, BID WC, Patrecia Pour, Christean Grief, MD, 325 mg at 12/26/16 0801 .  hydrALAZINE (APRESOLINE) injection 5 mg, 5 mg, Intravenous, Q2H PRN, Ivor Costa, MD .  HYDROmorphone (DILAUDID) injection 0.5 mg, 0.5 mg, Intravenous, Q2H PRN, Kary Kos, MD, 0.5 mg at 12/19/16 0442 .  menthol-cetylpyridinium (CEPACOL) lozenge 3 mg, 1 lozenge, Oral, PRN **OR** phenol (CHLORASEPTIC) mouth spray 1 spray, 1 spray, Mouth/Throat, PRN, Kary Kos, MD .  ondansetron (ZOFRAN) tablet 4 mg, 4 mg, Oral, Q6H PRN **OR** ondansetron (ZOFRAN) injection 4 mg, 4 mg, Intravenous, Q6H PRN, Kary Kos, MD .  oxyCODONE (Oxy IR/ROXICODONE) immediate release tablet 10 mg, 10 mg, Oral, Q3H PRN, Kary Kos, MD, 10 mg at 12/26/16 0903 .  pantoprazole (PROTONIX) EC tablet 40 mg, 40 mg, Oral, QHS, Kary Kos, MD, 40 mg at 12/25/16 2139 .  senna-docusate (Senokot-S) tablet 2 tablet, 2 tablet, Oral, QHS PRN, Newman Pies, MD, 2 tablet at 12/21/16 1830 .  sodium chloride flush (NS) 0.9 % injection 3 mL, 3  mL, Intravenous, Q12H, Kary Kos, MD, 3 mL at 12/26/16 0904 .  sodium chloride flush (NS) 0.9 % injection 3 mL, 3 mL, Intravenous, PRN, Kary Kos, MD .  triamterene-hydrochlorothiazide (DYAZIDE) 37.5-25 MG per capsule 1 capsule, 1 capsule, Oral, Daily, Kary Kos, MD, 1 capsule at 12/26/16 9675 .  zolpidem (AMBIEN) tablet 5 mg, 5 mg, Oral, QHS PRN, Ivor Costa, MD  Patients Current Diet: No diet orders on file  Precautions / Restrictions     Has the patient had 2 or more falls or a fall with injury in the past year?No.  Does report  1 fall with very minor injury to her hand  Prior Activity Level    Home Assistive Devices / Equipment    Prior Device Use: Indicate devices/aids used by the patient prior to current illness, exacerbation or injury? None  Prior Functional Level    Self Care: Did the patient need help bathing, dressing, using the toilet or eating?  Independent  Indoor Mobility: Did the patient need assistance with walking from room to room (with or without device)? Independent  Stairs: Did the patient need assistance with internal or external stairs (with or without device)? Independent  Functional Cognition: Did the patient need help planning regular tasks such as shopping or remembering to take medications? Independent  Current Functional Level Cognition       Extremity Assessment (includes Sensation/Coordination)          ADLs       Mobility       Transfers       Ambulation / Gait / Stairs / Wheelchair Mobility       Posture / Balance      Special needs/care consideration BiPAP/CPAP No CPM No Continuous Drip IV No Dialysis No       Life Vest No Oxygen No Special Bed No Trach Size No Wound Vac (area) No     Skin No                         Bowel mgmt: Last BM 12/21/16 Bladder mgmt: Voiding up on [facility] [employer] with help Diabetic mgmt No    Previous Home Environment    Discharge Living Setting    Social/Family/Support Systems     Goals/Additional Needs    Decrease burden of Care through IP rehab admission: N/A  Possible need for SNF placement upon discharge: Not planned.  Plan is to admit to [facility] on or about 01/06/17 to begin chemotherapy.  Patient Condition: This patient's condition remains as documented in the consult dated 12/25/16, in which the Rehabilitation Physician determined and documented that the patient's condition is appropriate for intensive rehabilitative care in an inpatient rehabilitation facility. Will admit to inpatient rehab today.  Preadmission Screen Completed By:  Retta Diones, 12/26/2016 11:57 AM ______________________________________________________________________   Discussed status with Dr. Letta Pate on 12/26/16 at 1016 and received telephone approval for admission today.  Admission Coordinator:  Retta Diones, time 1016/Date 12/26/16

## 2016-12-26 NOTE — Consult Note (Signed)
Chief Complaint: lymphoma  Referring Physician:Dr. Heath Lark  Supervising Physician: Corrie Mckusick  Patient Status: [facility] - In-pt  HPI: Katrina [name] is a 66 y.o. female who was admitted secondary to neurologic changes from a tumor around T5-8.  This was resected and found to be a lymphoma.  She is going to admitted to rehab today and plan to start chemotherapy within the next several weeks. IR has been asked to place a port a cath.  Past Medical History:  Past Medical History:  Diagnosis Date  . Anemia   . Arthritis   . 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 Hypertension   . Lymphadenopathy 11/2016   "extensive"  . Paresthesia of lower extremity 11/2016   bilateral  . Pulmonary nodules 11/2016  . Right renal mass 11/2016    Past Surgical History:  Past Surgical History:  Procedure Laterality Date  . ABDOMINAL HYSTERECTOMY    . BREAST LUMPECTOMY     left  . SHOULDER SURGERY     right    Family History:  Family History  Problem Relation Age of Onset  . Colon cancer Sister        at age 60  . Colon cancer Brother        at age 57  . COPD Mother   . Diabetes Mellitus II Mother   . CAD Mother        Angina  . Colon cancer Father   . Thyroid disease Sister   . Diabetes Mellitus II Sister     Social History:  reports that  has never smoked. she has never used smokeless tobacco. She reports that she does not drink alcohol or use drugs.  Allergies:  Allergies  Allergen Reactions  . Contrast Media [Iodinated Diagnostic Agents] Itching    Medications: Allergies as of 12/26/2016      Reactions   Contrast Media [iodinated Diagnostic Agents] Itching      Medication List    TAKE these medications   amLODipine 10 MG tablet Commonly known as:  NORVASC Take 1 tablet (10 mg total) daily by mouth.   cyclobenzaprine 10 MG tablet Commonly known as:  FLEXERIL Take 1 tablet (10 mg total) 3 (three) times daily as needed by mouth for muscle spasms.   docusate sodium 100 MG  capsule Commonly known as:  COLACE Take 100 mg by mouth daily.   ibuprofen 400 MG tablet Commonly known as:  ADVIL,MOTRIN Take 1 tablet (400 mg total) by mouth every 8 (eight) hours as needed for moderate pain.   oxyCODONE-acetaminophen 5-325 MG tablet Commonly known as:  PERCOCET/ROXICET Take 1 tablet by mouth every 6 (six) hours as needed for severe pain.   triamterene-hydrochlorothiazide 37.5-25 MG capsule Commonly known as:  DYAZIDE Take 1 capsule by mouth daily.       Please HPI for pertinent positives, otherwise complete 10 system ROS negative.  Mallampati Score: MD Evaluation Airway: WNL Heart: WNL Abdomen: WNL Chest/ Lungs: WNL ASA  Classification: 3 Mallampati/Airway Score: Two  Physical Exam: BP 135/80 (BP Location: Left Leg)   Pulse 78   Temp 98.6 F (37 C) (Oral)   Resp 18   Ht _0  (1.727 m)   Wt 251 lb 8 oz (114.1 kg)   SpO2 98%   BMI 38.24 kg/m  Body mass index is 38.24 kg/m. General: pleasant, WD, WN white female who is sitting in her chair in NAD HEENT: head is normocephalic, atraumatic.  Sclera are noninjected.  PERRL.  Ears and nose without any masses or lesions.  Mouth is pink and moist Heart: regular, rate, and rhythm.  Normal s1,s2. No obvious murmurs, gallops, or rubs noted.  Palpable radial pulses bilaterally Lungs: CTAB, no wheezes, rhonchi, or rales noted.  Respiratory effort nonlabored Abd: soft, NT, ND, +BS, no masses, hernias, or organomegaly Psych: A&Ox3 with an appropriate affect.   Labs: Results for orders placed or performed during the hospital encounter of 12/18/16 (from the past 48 hour(s))  CBC with Differential/Platelet     Status: Abnormal   Collection Time: 12/25/16  4:34 AM  Result Value Ref Range   WBC 13.0 (H) 4.0 - 10.5 K/uL   RBC 3.70 (L) 3.87 - 5.11 MIL/uL   Hemoglobin 9.8 (L) 12.0 - 15.0 g/dL   HCT 30.4 (L) 36.0 - 46.0 %   MCV 82.2 78.0 - 100.0 fL   MCH 26.5 26.0 - 34.0 pg   MCHC 32.2 30.0 - 36.0 g/dL   RDW  14.8 11.5 - 15.5 %   Platelets 399 150 - 400 K/uL   Neutrophils Relative % 80 %   Neutro Abs 10.4 (H) 1.7 - 7.7 K/uL   Lymphocytes Relative 11 %   Lymphs Abs 1.4 0.7 - 4.0 K/uL   Monocytes Relative 9 %   Monocytes Absolute 1.2 (H) 0.1 - 1.0 K/uL   Eosinophils Relative 0 %   Eosinophils Absolute 0.0 0.0 - 0.7 K/uL   Basophils Relative 0 %   Basophils Absolute 0.0 0.0 - 0.1 K/uL  Comprehensive metabolic panel     Status: Abnormal   Collection Time: 12/25/16  4:34 AM  Result Value Ref Range   Sodium 134 (L) 135 - 145 mmol/L   Potassium 4.0 3.5 - 5.1 mmol/L   Chloride 98 (L) 101 - 111 mmol/L   CO2 28 22 - 32 mmol/L   Glucose, Bld 118 (H) 65 - 99 mg/dL   BUN 29 (H) 6 - 20 mg/dL   Creatinine, Ser 0.92 0.44 - 1.00 mg/dL   Calcium 8.5 (L) 8.9 - 10.3 mg/dL   Total Protein 6.0 (L) 6.5 - 8.1 g/dL   Albumin 3.0 (L) 3.5 - 5.0 g/dL   AST 22 15 - 41 U/L   ALT 29 14 - 54 U/L   Alkaline Phosphatase 45 38 - 126 U/L   Total Bilirubin 0.4 0.3 - 1.2 mg/dL   GFR calc non Af Amer >60 >60 mL/min   GFR calc Af Amer >60 >60 mL/min    Comment: (NOTE) The eGFR has been calculated using the CKD EPI equation. This calculation has not been validated in all clinical situations. eGFR's persistently <60 mL/min signify possible Chronic Kidney Disease.    Anion gap 8 5 - 15  Uric acid     Status: Abnormal   Collection Time: 12/25/16  4:34 AM  Result Value Ref Range   Uric Acid, Serum 6.8 (H) 2.3 - 6.6 mg/dL  Lactate dehydrogenase     Status: None   Collection Time: 12/25/16  4:34 AM  Result Value Ref Range   LDH 136 98 - 192 U/L  Hepatitis B surface antibody     Status: None   Collection Time: 12/25/16  4:34 AM  Result Value Ref Range   Hep B S Ab Non Reactive     Comment: (NOTE)              Non Reactive: Inconsistent with immunity,  less than 10 mIU/mL              Reactive:     Consistent with immunity,                            greater than 9.9 mIU/mL Performed At:  [facility] [place], [place] [id number] Rush Farmer MD WU:[id number]   Hepatitis B surface antigen     Status: None   Collection Time: 12/25/16  4:34 AM  Result Value Ref Range   Hepatitis B Surface Ag Negative Negative    Comment: (NOTE) Performed At: [facility] [place], [place] [id number] Rush Farmer MD VH:[id number]   Hepatitis B core antibody, IgM     Status: None   Collection Time: 12/25/16  4:34 AM  Result Value Ref Range   Hep B C IgM Negative Negative    Comment: (NOTE) Performed At: [facility] [address] [place], [place] [id number] Rush Farmer MD UV:[id number]     Imaging: No results found.  Assessment/Plan 1. Lymphoma  We will plan to place a PAC when schedule allows.  The patient knows this and is agreeable. We will update on a date and time when we know.  Risks and benefits discussed with the patient including, but not limited to bleeding, infection, pneumothorax, or fibrin sheath development and need for additional procedures. All of the patient's questions were answered, patient is agreeable to proceed. Consent signed and in chart.   Thank you for this interesting consult.  I greatly enjoyed meeting Katrina [name] and look forward to participating in their care.  A copy of this report was sent to the requesting provider on this date.  Electronically Signed: Henreitta Cea 12/26/2016, 1:49 PM   I spent a total of 40 Minutes    in face to face in clinical consultation, greater than 50% of which was counseling/coordinating care for lymphoma

## 2016-12-26 NOTE — Consult Note (Signed)
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  Referring Physician: Dr. Saintclair Halsted.    HPI: Katrina [name] is a 66 y.o. female who was admitted to [facility] on 10/25 - 12/14/16 with generalized weakness, back pain and BLE numbness/weakness with difficulty walking. She was found to have extensive diffuse lymphadenopathy, LUL and left chest wall metastases, right renal mass and pathologic left rib fractures. She was discharged to home with walker and plans to follow up with Hem/onc but continued to have progressive RLE weakness with foot drop and ascending numbness to lower abdomen. 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  She was evaluated by Dr. Saintclair Halsted and taken to OR the same day for decompressive thoracic laminectomy from T5-T8 for resection of tumor.  Pathology revealed diffuse large B-cell lymphoma and Dr. Alvy Bimler consulted for input and recommend starting first round of chemo Nov 19th or sooner if doing well. Interventional radiology consulted to place [facility] and XRT to begin 2 weeks post op. Patient with paraplegia and significant deficits in mobility as well as ability to carry out ADL tasks. CIR recommended by rehab team.  Patient lives alone and sedentary but was independent PTA. Sat up in chair all day yesterday and this am. Was staying with son due to weakness and plans to d/c there.    Patient feels that her sensation is starting to return on the left side, still feels numb in the right lower extremity as well as lower abdominal area.  States that she is able to control her bladder, has been constipated with bowels  Review of Systems  HENT: Negative for hearing loss and tinnitus.   Eyes: Negative for blurred vision.    Respiratory: Positive for shortness of breath (chronic for past 4-5 years). Negative for cough.   Cardiovascular: Negative for chest pain and palpitations.       Band like sensation around the chest (under breast)  Gastrointestinal: Positive for constipation (chronic) and heartburn. Negative for abdominal pain, nausea and vomiting.  Genitourinary: Negative for dysuria and urgency.  Musculoskeletal: Positive for back pain.  Neurological: Positive for sensory change, focal weakness and weakness.  Psychiatric/Behavioral: The patient is not nervous/anxious and does not have insomnia.       Past Medical History:  Diagnosis Date  . Anemia   . Arthritis   . 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 Hypertension   . Lymphadenopathy 11/2016   "extensive"  . Paresthesia of lower extremity 11/2016   bilateral  . Pulmonary nodules 11/2016  . Right renal mass 11/2016    Past Surgical History:  Procedure Laterality Date  . ABDOMINAL HYSTERECTOMY    . BREAST LUMPECTOMY     left  . SHOULDER SURGERY     right    Family History  Problem Relation Age of Onset  . Colon cancer Sister        at age 63  . Colon cancer Brother        at age 38  . COPD Mother   . Diabetes Mellitus II Mother   . CAD Mother        Angina  . Colon cancer Father   . Thyroid disease Sister   . Diabetes Mellitus II Sister  Social History:  Widowed. Independent without AD. Social smoker--quit 20 years ago but exposed to second hand smoke (father and husband). She smoked cigarettes. She has never used smokeless tobacco. She drinks wine occasionally. She reports that she does not use drugs.    Allergies  Allergen Reactions  . Contrast Media [Iodinated Diagnostic Agents] Itching    Medications Prior to Admission  Medication Sig Dispense Refill  . docusate sodium (COLACE) 100 MG capsule Take 100 mg by mouth daily.    Marland Kitchen ibuprofen (ADVIL,MOTRIN) 400 MG tablet Take 1 tablet (400 mg total) by mouth every 8 (eight) hours as  needed for moderate pain. 20 tablet 0  . oxyCODONE-acetaminophen (PERCOCET/ROXICET) 5-325 MG tablet Take 1 tablet by mouth every 6 (six) hours as needed for severe pain. 10 tablet 0  . triamterene-hydrochlorothiazide (DYAZIDE) 37.5-25 MG capsule Take 1 capsule by mouth daily.      Home:    Functional History:   Functional Status:  Mobility:          ADL:    Cognition:       There were no vitals taken for this visit. Physical Exam  Nursing note and vitals reviewed. Constitutional: She is oriented to person, place, and time. She appears well-developed and well-nourished. No distress.  Obese female in NAD.   HENT:  Head: Normocephalic and atraumatic.  Mouth/Throat: Oropharynx is clear and moist.  Neck: Normal range of motion. Neck supple.  Cardiovascular: Normal rate and regular rhythm.  Respiratory: Effort normal and breath sounds normal. No stridor.  GI: Soft. Bowel sounds are normal. She exhibits no distension. There is no tenderness.  Musculoskeletal: She exhibits no edema or tenderness.  Neurological: She is alert and oriented to person, place, and time.  Speech clear. Able to follow commands without difficulty. Sensory deficits below T4. Paraparesis RLE> LLE with decreased coordination.   Skin: Skin is warm and dry. She is not diaphoretic.  Psychiatric: She has a normal mood and affect. Her behavior is normal. Judgment and thought content normal.  Motor strength is 5/5 bilateral deltoid, bicep, tricep, grip, 3- right hip flexor knee extensor ankle dorsiflexor, 4- in the left hip flexor knee extensor ankle dorsiflexor Patient has a partial preservation to light touch on the left side below T8 distribution  No results found for this or any previous visit (from the past 24 hour(s)). No results found.  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 Does the need for close, 24 hr/day medical supervision in concert with the  patient's rehab needs make it unreasonable for this patient to be served in a less intensive setting? Yes 2. Co-Morbidities requiring supervision/potential complications: Pathologic left rib fractures, right renal mass 3. Due to bladder management, bowel management, safety, skin/wound care, disease management, medication administration, pain management and patient education, does the patient require 24 hr/day rehab nursing? Yes 4. Does the patient require coordinated care of a physician, rehab nurse, PT (1-2 hrs/day, 5 days/week) and OT (1-2 hrs/day, 5 days/week) to address physical and functional deficits in the context of the above medical diagnosis(es)? Yes Addressing deficits in the following areas: balance, endurance, locomotion, strength, transferring, bowel/bladder control, bathing, dressing, feeding, grooming, toileting and psychosocial support 5. Can the patient actively participate in an intensive therapy program of at least 3 hrs of therapy per day at least 5 days per week? Yes 6. The potential for patient to make measurable gains while on inpatient rehab is fair 7. Anticipated functional outcomes upon discharge  from inpatient rehab are supervision  with PT, supervision with OT, n/a with SLP. 8. Estimated rehab length of stay to reach the above functional goals is: 10-12 days 9. Anticipated D/C setting: To [facility] for chemotherapy 10. Anticipated post D/C treatments: We will need acute care PT OT while at [place]. Overall Rehab/Functional Prognosis: fair  RECOMMENDATIONS: This patient's condition is appropriate for continued rehabilitative care in the following setting: CIR Patient has agreed to participate in recommended program. Yes Note that insurance prior authorization may be required for reimbursement for recommended care.  Comment: Please note that discharge plan would be to [facility] for chemotherapy and possible radiation therapy, goal of inpatient  rehabilitation is to increase strength and endurance as well as mobility to reduce comorbidities while patient is undergoing definitive treatment for her B cell lymphoma  Charlett Blake M.D. [facility] FAAPM&R (Sports Med, Neuromuscular Med) Diplomate Am Board of Electrodiagnostic Med  Retta Diones, RN 12/26/2016

## 2016-12-26 NOTE — Progress Notes (Signed)
Patient admitting to CIR today; no further CSW needs.  CSW signing off.  Laveda Abbe, [place] Clinical Social Worker [phone]

## 2016-12-26 NOTE — H&P (Signed)
Physical Medicine and Rehabilitation Admission H&P         Chief Complaint  Patient presents with  . 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                HPI:  Katrina [name] is a 66 y.o. female who was admitted to [facility] on 10/25 - 12/14/16 with generalized weakness, back pain and BLE numbness/weakness with difficulty walking. She was found to have extensive diffuse lymphadenopathy, LUL and left chest wall metastases, right renal mass and pathologic left rib fractures. She was discharged to home with walker and plans to follow up with Hem/onc but continued to have progressive RLE weakness with foot drop and ascending numbness to lower abdomen. 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  She was evaluated by Dr. Saintclair Halsted and taken to OR the same day for decompressive thoracic laminectomy from T5-T8 for resection of tumor.  Pathology revealed diffuse large B-cell lymphoma and Dr. Alvy Bimler consulted for input and recommend starting first round of chemo Nov 19th or sooner if doing well. Interventional radiology consulted to place [facility] and XRT to begin 2 weeks post op. Patient with paraplegia and significant deficits in mobility as well as ability to carry out ADL tasks. CIR was recommended by rehab team.     Review of Systems  HENT: Negative for hearing loss and tinnitus.   Eyes: Negative for blurred vision and double vision.  Respiratory: Negative for cough and shortness of breath.   Cardiovascular: Negative for chest pain and palpitations.  Gastrointestinal: Negative for constipation, heartburn and nausea.  Genitourinary: Negative for dysuria and urgency.  Musculoskeletal: Negative for myalgias.  Skin: Negative for rash.  Neurological: Positive for sensory change (sensation imporving daily),  focal weakness and weakness.  Psychiatric/Behavioral: The patient is not nervous/anxious and does not have insomnia.         Past Medical History:  Diagnosis Date  . Anemia    . Arthritis    . 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 Hypertension    . Lymphadenopathy 11/2016    "extensive"  . Paresthesia of lower extremity 11/2016    bilateral  . Pulmonary nodules 11/2016  . Right renal mass 11/2016           Past Surgical History:  Procedure Laterality Date  . ABDOMINAL HYSTERECTOMY      . BREAST LUMPECTOMY        left  . SHOULDER SURGERY        right           Family History  Problem Relation Age of Onset  . Colon cancer Sister          at age 64  . Colon cancer Brother          at age 38  . COPD Mother    . Diabetes Mellitus II Mother    . CAD Mother          Angina  . Colon cancer Father    . Thyroid disease Sister    . Diabetes Mellitus II Sister        Social History: Widowed. Independent without AD. Social smoker--quit 20 years ago but exposed to second hand smoke (father and husband). She smoked cigarettes. She has never used smokeless tobacco. She drinks wine  occasionally. She reports that she does not use drugs         Allergies  Allergen Reactions  . Contrast Media [Iodinated Diagnostic Agents] Itching            Medications Prior to Admission  Medication Sig Dispense Refill  . docusate sodium (COLACE) 100 MG capsule Take 100 mg by mouth daily.      Marland Kitchen ibuprofen (ADVIL,MOTRIN) 400 MG tablet Take 1 tablet (400 mg total) by mouth every 8 (eight) hours as needed for moderate pain. 20 tablet 0  . oxyCODONE-acetaminophen (PERCOCET/ROXICET) 5-325 MG tablet Take 1 tablet by mouth every 6 (six) hours as needed for severe pain. 10 tablet 0  . triamterene-hydrochlorothiazide (DYAZIDE) 37.5-25 MG capsule Take 1 capsule by mouth daily.          Drug Regimen Review  Drug regimen was reviewed and remains appropriate with no significant issues identified   Home: Home  Living Family/patient expects to be discharged to:: Private residence Living Arrangements: Alone Available Help at Discharge: Family Type of Home: House Home Access: Stairs to enter Technical brewer of Steps: 3-4 Entrance Stairs-Rails: Can reach both Home Layout: One level Bathroom Shower/Tub: Tub/shower unit, Architectural technologist: Programmer, systems: Yes Home Equipment: Bedside commode, Wheelchair - manual, Environmental consultant - 2 wheels, Shower seat Additional Comments: Been staying at her son's house since 12/14/16(Above information is son's home)   Functional History: Prior Function Level of Independence: Independent Comments: Pt independent, but has been staying at son's house past few days. Son has been assisting with ADLs and IADLs   Functional Status:  Mobility: Bed Mobility Overal bed mobility: Needs Assistance Bed Mobility: Rolling, Sidelying to Sit Rolling: Min guard Sidelying to sit: Min guard General bed mobility comments: up in recliner at entry  Transfers Overall transfer level: Needs assistance Equipment used: Rolling walker (2 wheeled) Transfers: Sit to/from Stand, [employer] Transfers Sit to Stand: Mod assist Stand pivot transfers: Mod assist General transfer comment: modA for powerup to RW Ambulation/Gait Ambulation/Gait assistance: Mod assist, +2 physical assistance Ambulation Distance (Feet): 30 Feet(required one seated rest break) Assistive device: Rolling walker (2 wheeled) Gait Pattern/deviations: Step-through pattern, Ataxic, Decreased dorsiflexion - right, Decreased dorsiflexion - left, Trunk flexed General Gait Details: modAx2 for gait and steadying with RW, pt notably more fatigued today with therapy, vc for sequencing and for achieving balance before starting next step, pt still lacks adequate knee flexion to place foot down in close proximity, instead swing her LE forward from her hip and ocassionally outside her base of support which  requires her to slide it back under her. Gait velocity: slowed Gait velocity interpretation: Below normal speed for age/gender   ADL: ADL Overall ADL's : Needs assistance/impaired Eating/Feeding: Set up, Sitting Grooming: Set up, Sitting, Oral care, Wash/dry hands, Wash/dry face, Brushing hair Upper Body Bathing: Set up, Sitting Upper Body Bathing Details (indicate cue type and reason): while seated on commode chair  Lower Body Bathing: Maximal assistance, Sit to/from stand Lower Body Bathing Details (indicate cue type and reason): Educated pt on AE for LB bathing. Upper Body Dressing : Set up, Sitting Lower Body Dressing: Minimal assistance, Sit to/from stand, +2 for safety/equipment, Cueing for sequencing, With adaptive equipment, Adhering to back precautions Lower Body Dressing Details (indicate cue type and reason): Provided education on AE for LB dressing. Pt donned socks and underwear with Min A for balance in standing.  Toilet Transfer: Moderate assistance, Ambulation, RW, BSC, Cueing for safety, Cueing for sequencing Toilet  Transfer Details (indicate cue type and reason): pt with decreased balance with reports of poor sensation and proprioception in B feet. Toileting- Clothing Manipulation and Hygiene: Minimal assistance, Cueing for safety, Cueing for sequencing, Sit to/from stand Toileting - Clothing Manipulation Details (indicate cue type and reason): Pt standing from [facility] with RW with min A for standing balance while pt performed hygiene Functional mobility during ADLs: Moderate assistance, +2 for safety/equipment, Rolling walker General ADL Comments: Pt verbalized 3/3 precautions accurately this session and maintaining with functional mobility trasks and self care.   Cognition: Cognition Overall Cognitive Status: Within Functional Limits for tasks assessed Orientation Level: Oriented X4 Cognition Arousal/Alertness: Awake/alert Behavior During Therapy: WFL for tasks  assessed/performed, Anxious Overall Cognitive Status: Within Functional Limits for tasks assessed General Comments: patietn is agreeable to participate in therapy and demonstrated active listening and an eagerness to learn, patient with overall pleasant demeanor       Blood pressure 135/80, pulse 78, temperature 98.6 F (37 C), temperature source Oral, resp. rate 18, height 5' 8"  (1.727 m), weight 114.1 kg (251 lb 8 oz), SpO2 98 %. Physical Exam  Nursing note and vitals reviewed. Constitutional: She is oriented to person, place, and time. She appears well-developed and well-nourished.  HENT:  Head: Normocephalic and atraumatic.  Mouth/Throat: Oropharynx is clear and moist.  Eyes: Conjunctivae and EOM are normal. Pupils are equal, round, and reactive to light.  Neck: Normal range of motion. Neck supple.  Cardiovascular: Normal rate and regular rhythm.  No murmur heard. Respiratory: Effort normal and breath sounds normal. Stridor present. No respiratory distress. She has no wheezes.  GI: Soft. Bowel sounds are normal. She exhibits no distension. There is no tenderness.  Musculoskeletal: She exhibits no edema or tenderness.  Neurological: She is alert and oriented to person, place, and time. Coordination abnormal.  Speech clear. Reports decrease in numbness of torso with increased sensation BLE.   Skin: Skin is warm and dry.  Psychiatric: She has a normal mood and affect. Her behavior is normal. Judgment and thought content normal.   Motor strength is 5/5 bilateral deltoid, bicep, tricep, grip 3+ right hip flexor knee extensor ankle dorsiflexor 4 at the left hip flexor and extensor ankle dorsiflexor Sensation intact to light touch bilateral upper and lower limb there is still some paresthesia to light touch below T8 bilaterally. Negative Beevor's test  Deep tendon reflexes are 2+ bilateral knees absent bilateral ankles Lab Results Last 48 Hours        Results for orders placed or  performed during the hospital encounter of 12/18/16 (from the past 48 hour(s))  CBC with Differential/Platelet     Status: Abnormal    Collection Time: 12/25/16  4:34 AM  Result Value Ref Range    WBC 13.0 (H) 4.0 - 10.5 K/uL    RBC 3.70 (L) 3.87 - 5.11 MIL/uL    Hemoglobin 9.8 (L) 12.0 - 15.0 g/dL    HCT 30.4 (L) 36.0 - 46.0 %    MCV 82.2 78.0 - 100.0 fL    MCH 26.5 26.0 - 34.0 pg    MCHC 32.2 30.0 - 36.0 g/dL    RDW 14.8 11.5 - 15.5 %    Platelets 399 150 - 400 K/uL    Neutrophils Relative % 80 %    Neutro Abs 10.4 (H) 1.7 - 7.7 K/uL    Lymphocytes Relative 11 %    Lymphs Abs 1.4 0.7 - 4.0 K/uL    Monocytes Relative 9 %  Monocytes Absolute 1.2 (H) 0.1 - 1.0 K/uL    Eosinophils Relative 0 %    Eosinophils Absolute 0.0 0.0 - 0.7 K/uL    Basophils Relative 0 %    Basophils Absolute 0.0 0.0 - 0.1 K/uL  Comprehensive metabolic panel     Status: Abnormal    Collection Time: 12/25/16  4:34 AM  Result Value Ref Range    Sodium 134 (L) 135 - 145 mmol/L    Potassium 4.0 3.5 - 5.1 mmol/L    Chloride 98 (L) 101 - 111 mmol/L    CO2 28 22 - 32 mmol/L    Glucose, Bld 118 (H) 65 - 99 mg/dL    BUN 29 (H) 6 - 20 mg/dL    Creatinine, Ser 0.92 0.44 - 1.00 mg/dL    Calcium 8.5 (L) 8.9 - 10.3 mg/dL    Total Protein 6.0 (L) 6.5 - 8.1 g/dL    Albumin 3.0 (L) 3.5 - 5.0 g/dL    AST 22 15 - 41 U/L    ALT 29 14 - 54 U/L    Alkaline Phosphatase 45 38 - 126 U/L    Total Bilirubin 0.4 0.3 - 1.2 mg/dL    GFR calc non Af Amer >60 >60 mL/min    GFR calc Af Amer >60 >60 mL/min      Comment: (NOTE) The eGFR has been calculated using the CKD EPI equation. This calculation has not been validated in all clinical situations. eGFR's persistently <60 mL/min signify possible Chronic Kidney Disease.      Anion gap 8 5 - 15  Uric acid     Status: Abnormal    Collection Time: 12/25/16  4:34 AM  Result Value Ref Range    Uric Acid, Serum 6.8 (H) 2.3 - 6.6 mg/dL  Lactate dehydrogenase     Status: None     Collection Time: 12/25/16  4:34 AM  Result Value Ref Range    LDH 136 98 - 192 U/L  Hepatitis B surface antibody     Status: None    Collection Time: 12/25/16  4:34 AM  Result Value Ref Range    Hep B S Ab Non Reactive        Comment: (NOTE)              Non Reactive: Inconsistent with immunity,                            less than 10 mIU/mL              Reactive:     Consistent with immunity,                            greater than 9.9 mIU/mL Performed At: [facility] [place], [place] [id number] Rush Farmer MD [id number]    Hepatitis B surface antigen     Status: None    Collection Time: 12/25/16  4:34 AM  Result Value Ref Range    Hepatitis B Surface Ag Negative Negative      Comment: (NOTE) Performed At: [facility] [place], [place] [id number] Rush Farmer MD [id number]    Hepatitis B core antibody, IgM     Status: None    Collection Time: 12/25/16  4:34 AM  Result Value Ref Range    Hep B C IgM Negative Negative      Comment: (  NOTE) Performed At: [facility] [place], [place] [id number] Rush Farmer MD [id number]        Imaging Results (Last 48 hours)  No results found.           Medical Problem List and Plan: 1.  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  DVT Prophylaxis/Anticoagulation: Pharmaceutical: Lovenox added as > week post op. Check dopplers in am.  3. Pain Management: oxycodone prn effective 4. Mood: LCSW to follow for evaluation and support. Reports that she is managing to deal with diagnosis 5. Neuropsych: This patient is capable of making decisions on her own behalf. 6. Skin/Wound Care: routine pressure relief measures 7. Fluids/Electrolytes/Nutrition: Monitor I/O. Check lytes in am. 8.HTN: Monitor BP bid. Continue Norvasc and dyazide.  9. ABLA: on iron. Check CBC in am.' 10. Leucocytosis: Likely reactive due to steroids. Monitor for signs of  infection. Steroids being weaned off? 11. AKI: Due to dehydration . Encourage fluid intake. Will check lytes in am.  12. Hyponatremia: Will monitor for now. Will check lytes in am.        Post Admission Physician Evaluation: 1. Functional deficits secondary  to paraplegia. 2. Patient is admitted to receive collaborative, interdisciplinary care between the physiatrist, rehab nursing staff, and therapy team. 3. Patient's level of medical complexity and substantial therapy needs in context of that medical necessity cannot be provided at a lesser intensity of care such as a SNF. 4. Patient has experienced substantial functional loss from his/her baseline which was documented above under the "Functional History" and "Functional Status" headings.  Judging by the patient's diagnosis, physical exam, and functional history, the patient has potential for functional progress which will result in measurable gains while on inpatient rehab.  These gains will be of substantial and practical use upon discharge  in facilitating mobility and self-care at the household level. 5. Physiatrist will provide 24 hour management of medical needs as well as oversight of the therapy plan/treatment and provide guidance as appropriate regarding the interaction of the two. 6. The Preadmission Screening has been reviewed and patient status is unchanged unless otherwise stated above. 7. 24 hour rehab nursing will assist with bladder management, bowel management, safety, skin/wound care, disease management, medication administration, pain management and patient education  and help integrate therapy concepts, techniques,education, etc. 8. PT will assess and treat for/with: pre gait, gait training, endurance , safety, equipment, neuromuscular re education.   Goals are: Mod I/Sup. 9. OT will assess and treat for/with: ADLs, Cognitive perceptual skills, Neuromuscular re education, safety, endurance, equipment.   Goals are: modI/Sup.  Therapy may proceed with showering this patient. 10. SLP will assess and treat for/with: NA.  Goals are: NA. 11. Case Management and Social Worker will assess and treat for psychological issues and discharge planning. 12. Team conference will be held weekly to assess progress toward goals and to determine barriers to discharge. 13. Patient will receive at least 3 hours of therapy per day at least 5 days per week. 14. ELOS: 10d       15. Prognosis:  excellent          Charlett Blake M.D. [facility] FAAPM&R (Sports Med, Neuromuscular Med) Diplomate [place] Board of Electrodiagnostic Med  Flora Lipps 12/26/2016

## 2016-12-26 NOTE — Care Management Note (Signed)
Case Management Note  Patient Details  Name: Katrina [name] MRN: [id number] Date of Birth: 01/26/1951  Subjective/Objective:                    Action/Plan: Pt discharging to CIR today. No further needs per CM.  Expected Discharge Date:                  Expected Discharge Plan:  Skilled Nursing Facility  In-House Referral:  Clinical Social Work  Discharge planning Services  CM Consult  Post Acute Care Choice:    Choice offered to:     DME Arranged:    DME Agency:     HH Arranged:    [place] Agency:     Status of Service:  Completed, signed off  If discussed at [employer], dates discussed:    Additional Comments:  Pollie Friar, RN 12/26/2016, 10:16 AM

## 2016-12-26 NOTE — Progress Notes (Signed)
NEUROSURGERY PROGRESS NOTE  Doing well. Complains of appropriate back soreness. Ambulating and voiding well Good strength and sensation Incision CDI  Temp:  [97.5 F (36.4 C)-98.4 F (36.9 C)] 98.1 F (36.7 C) (11/08 0449) Pulse Rate:  [66-86] 66 (11/08 0449) Resp:  [20] 20 (11/08 0449) BP: (124-154)/(63-78) 154/78 (11/08 0449) SpO2:  [96 %-98 %] 98 % (11/08 0449)  Plan: Will plan for rehab when bed available  Eleonore Chiquito, NP 12/26/2016 7:43 AM

## 2016-12-27 ENCOUNTER — Inpatient Hospital Stay (HOSPITAL_COMMUNITY): Payer: PRIVATE HEALTH INSURANCE | Admitting: Occupational Therapy

## 2016-12-27 ENCOUNTER — Inpatient Hospital Stay (HOSPITAL_COMMUNITY): Payer: Medicare Other

## 2016-12-27 ENCOUNTER — Inpatient Hospital Stay (HOSPITAL_COMMUNITY): Payer: PRIVATE HEALTH INSURANCE | Admitting: Physical Therapy

## 2016-12-27 ENCOUNTER — Encounter (HOSPITAL_COMMUNITY): Payer: Self-pay | Admitting: *Deleted

## 2016-12-27 ENCOUNTER — Other Ambulatory Visit: Payer: Self-pay | Admitting: Hematology and Oncology

## 2016-12-27 DIAGNOSIS — D497 Neoplasm of unspecified behavior of endocrine glands and other parts of nervous system: Secondary | ICD-10-CM

## 2016-12-27 LAB — CBC WITH DIFFERENTIAL/PLATELET
Basophils Absolute: 0 10*3/uL (ref 0.0–0.1)
Basophils Relative: 0 %
Eosinophils Absolute: 0 10*3/uL (ref 0.0–0.7)
Eosinophils Relative: 0 %
HEMATOCRIT: 30.6 % — AB (ref 36.0–46.0)
HEMOGLOBIN: 10.1 g/dL — AB (ref 12.0–15.0)
Lymphocytes Relative: 13 %
Lymphs Abs: 1.8 10*3/uL (ref 0.7–4.0)
MCH: 26.8 pg (ref 26.0–34.0)
MCHC: 33 g/dL (ref 30.0–36.0)
MCV: 81.2 fL (ref 78.0–100.0)
MONO ABS: 1.1 10*3/uL — AB (ref 0.1–1.0)
MONOS PCT: 7 %
NEUTROS ABS: 11.4 10*3/uL — AB (ref 1.7–7.7)
NEUTROS PCT: 80 %
Platelets: 371 10*3/uL (ref 150–400)
RBC: 3.77 MIL/uL — AB (ref 3.87–5.11)
RDW: 14.8 % (ref 11.5–15.5)
WBC: 14.3 10*3/uL — ABNORMAL HIGH (ref 4.0–10.5)

## 2016-12-27 LAB — COMPREHENSIVE METABOLIC PANEL
ALBUMIN: 3.2 g/dL — AB (ref 3.5–5.0)
ALT: 26 U/L (ref 14–54)
ANION GAP: 9 (ref 5–15)
AST: 22 U/L (ref 15–41)
Alkaline Phosphatase: 51 U/L (ref 38–126)
BUN: 26 mg/dL — ABNORMAL HIGH (ref 6–20)
CHLORIDE: 96 mmol/L — AB (ref 101–111)
CO2: 26 mmol/L (ref 22–32)
CREATININE: 1.01 mg/dL — AB (ref 0.44–1.00)
Calcium: 8.5 mg/dL — ABNORMAL LOW (ref 8.9–10.3)
GFR calc non Af Amer: 58 mL/min — ABNORMAL LOW (ref >60)
GLUCOSE: 123 mg/dL — AB (ref 65–99)
Potassium: 3.6 mmol/L (ref 3.5–5.1)
SODIUM: 131 mmol/L — AB (ref 135–145)
Total Bilirubin: 0.4 mg/dL (ref 0.3–1.2)
Total Protein: 6.1 g/dL — ABNORMAL LOW (ref 6.5–8.1)

## 2016-12-27 MED ORDER — ENOXAPARIN SODIUM 40 MG/0.4ML ~~LOC~~ SOLN
40.0000 mg | SUBCUTANEOUS | Status: DC
  Administered 2016-12-31 – 2017-01-08 (×9): 40 mg via SUBCUTANEOUS
  Filled 2016-12-27 (×9): qty 0.4

## 2016-12-27 MED ORDER — CEFAZOLIN SODIUM-DEXTROSE 2-4 GM/100ML-% IV SOLN
2.0000 g | INTRAVENOUS | Status: AC
  Administered 2016-12-30: 2 g via INTRAVENOUS

## 2016-12-27 MED ORDER — DEXAMETHASONE 4 MG PO TABS
8.0000 mg | ORAL_TABLET | Freq: Every day | ORAL | Status: DC
  Administered 2016-12-28 – 2017-01-07 (×10): 8 mg via ORAL
  Filled 2016-12-27 (×10): qty 2

## 2016-12-27 NOTE — Evaluation (Addendum)
Occupational Therapy Assessment and Plan  Patient Details  Name: Katrina [name] MRN: [id number] Date of Birth: 1950-06-12  OT Diagnosis: paraparesis at level T6 Rehab Potential: Rehab Potential (ACUTE ONLY): Good ELOS: ~2 weeks   Today's Date: 12/27/2016 OT Individual Time: 1030-1130 OT Individual Time Calculation (min): 60 min     Problem List:  Patient Active Problem List   Diagnosis Date Noted  . 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 Paraparesis ([place]) 12/19/2016  . Chest wall mass 12/19/2016  . 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 S/P lumbar laminectomy 12/18/2016  . Numbness and tingling 12/13/2016  . Numbness and tingling of both lower extremities 12/12/2016  . Hypertension 12/12/2016  . Anemia 12/12/2016  . Abdominal lymphadenopathies 12/12/2016  . Renal mass, right 12/12/2016  . Constipation 12/12/2016  . FH: colon cancer 10/22/2013  . RUQ discomfort 10/22/2013    Past Medical History:  Past Medical History:  Diagnosis Date  . Anemia   . Arthritis   . 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 Hypertension   . Lymphadenopathy 11/2016   "extensive"  . Paresthesia of lower extremity 11/2016   bilateral  . Pulmonary nodules 11/2016  . Right renal mass 11/2016   Past Surgical History:  Past Surgical History:  Procedure Laterality Date  . ABDOMINAL HYSTERECTOMY    . BREAST LUMPECTOMY     left  . SHOULDER SURGERY     right    Assessment & Plan Clinical Impression: Patient is a 66 y.o. year old femalewho was admitted to [facility] on 10/25 - 12/14/16 with generalized weakness, back pain and BLE numbness/weakness with difficulty walking. She was found to have extensive diffuse lymphadenopathy, LUL and left chest wall metastases, right renal mass and pathologic left rib fractures. She was discharged to home with walker and plans to follow up with Hem/onc but continued to have progressive RLE weakness with foot drop and ascending numbness to lower  abdomen. 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 She was evaluated by Dr. Saintclair Halsted and taken to OR the same day for decompressive thoracic laminectomy from T5-T8 for resection of tumor. Pathology revealed diffuse large B-cell lymphoma and Dr. Alvy Bimler consulted for input and recommend starting first round of chemo Nov 19th or sooner if doing well. Interventional radiology consulted to place [facility] and XRT to begin 2 weeks post op. Patient with paraplegia and significant deficits in mobility as well as ability to carry out ADL tasks.  Patient transferred to CIR on 12/26/2016 .    Patient currently requires mod to max A with basic self-care skills and functional mobility  secondary to muscle weakness, decreased cardiorespiratoy endurance, impaired timing and sequencing, unbalanced muscle activation, ataxia and decreased coordination and decreased standing balance, decreased postural control and decreased balance strategies.  Prior to hospitalization, patient could complete ADL with independent .  Patient will benefit from skilled intervention to decrease level of assist with basic self-care skills and increase independence with basic self-care skills prior to discharge to [facility] for her firest chem treatment. Anticipate patient will require intermittent supervision and follow up home health.  OT - End of Session Activity Tolerance: Tolerates 30+ min activity with multiple rests OT Assessment Rehab Potential (ACUTE ONLY): Good OT Patient demonstrates impairments in the following area(s): Balance;Safety;Edema;Skin Integrity;Motor;Pain OT Basic  ADL's Functional Problem(s): Grooming;Bathing;Dressing;Toileting OT Transfers Functional Problem(s): Toilet;Tub/Shower OT Additional Impairment(s): None OT Plan OT Intensity: Minimum of 1-2  x/day, 45 to 90 minutes Ot frequency 5 out of 7 days OT Duration/Estimated Length of Stay: ~2 weeks OT Treatment/Interventions: Balance/vestibular training;Discharge planning;Pain management;Self Care/advanced ADL retraining;Therapeutic Activities;UE/LE Coordination activities;Therapeutic Exercise;Skin care/wound managment;Patient/family education;Functional mobility training;Cognitive remediation/compensation;DME/adaptive equipment instruction;Neuromuscular re-education;Psychosocial support;UE/LE Strength taining/ROM;Wheelchair propulsion/positioning OT Self Feeding Anticipated Outcome(s): n/a OT Basic Self-Care Anticipated Outcome(s): supervision OT Toileting Anticipated Outcome(s): supervision  OT Bathroom Transfers Anticipated Outcome(s): supervision OT Recommendation Recommendations for Other Services: Neuropsych consult Patient destination: Home Follow Up Recommendations: Home health OT Equipment Recommended: To be determined   Skilled Therapeutic Intervention Ot eval initiated with Ot purpose, role and goals discussed. Self care retraining at shower level with focus on transfers (stand pivot and stand step) with UE support to toilet, shower seat, recliner and w/c, sit to stands, standing balance with one Ue support during clothing management. Pt required mod A for transfers with grab bars and RW. Pt did require A for peri care in standing but able to progress to only needing one UE and min A for balance during pulling up pants. Pt able to perform grooming including drying her hair at w/c level with setup. Left resting in recliner at end of session.   OT Evaluation Precautions/Restrictions  Precautions Precautions: Fall;Back Precaution Comments: Reviewed 3/3 back precautions  Restrictions Weight Bearing Restrictions: No General Chart Reviewed: Yes Family/Caregiver Present: No Vital Signs   Pain   Home Living/Prior Functioning Home Living Family/patient expects to be discharged  to:: Private residence Living Arrangements: Alone Available Help at Discharge: Family Type of Home: House Home Access: Stairs to enter [employer] of Steps: 3 in the front and in the back - plan on building a ramp on the back Entrance Stairs-Rails: None Home Layout: One level Bathroom Shower/Tub: Tub/shower unit, Architectural technologist: Standard Additional Comments: Been staying at her son's house since 12/14/16  Lives With: Son ADL ADL ADL Comments: see functional navigator Vision Baseline Vision/History: Wears glasses Wears Glasses: Reading only Patient Visual Report: No change from baseline Vision Assessment?: No apparent visual deficits Perception  Perception: Within Functional Limits Praxis Praxis: Impaired Praxis Impairment Details: Motor planning Cognition Overall Cognitive Status: Within Functional Limits for tasks assessed Arousal/Alertness: Awake/alert Orientation Level: Person;Place;Situation Person: Oriented Place: Oriented Situation: Oriented Year: 2018 Month: November Day of Week: Correct Memory: Appears intact Immediate Memory Recall: Sock;Blue;Bed Awareness: Appears intact Problem Solving: Appears intact Safety/Judgment: Appears intact Sensation Sensation Light Touch: Impaired Detail Light Touch Impaired Details: Impaired RLE;Impaired LLE Hot/Cold: Impaired by gross assessment Proprioception: Impaired Detail Proprioception Impaired Details: Impaired RLE;Impaired LLE Coordination Gross Motor Movements are Fluid and Coordinated: No Fine Motor Movements are Fluid and Coordinated: Yes Motor  Motor Motor: Paraplegia;Ataxia Mobility  Transfers Transfers: Sit to Stand;Stand to Sit Sit to Stand: 3: Mod assist Stand to Sit: 4: Min assist  Trunk/Postural Assessment  Cervical Assessment Cervical Assessment: Within Functional Limits Thoracic Assessment Thoracic Assessment: (restrictions due to back precautions) Lumbar Assessment Lumbar  Assessment: Within Functional Limits Postural Control Postural Control: Within Functional Limits  Balance Balance Balance Assessed: Yes Dynamic Sitting Balance Sitting balance - Comments: Maintains sitting balance at EOB and on BSC Static Standing Balance Static Standing - Balance Support: During functional activity Static Standing - Level of Assistance: 3: Mod assist Dynamic Standing Balance Dynamic Standing - Balance Support: During functional activity Dynamic Standing - Level of Assistance: 3: Mod assist Extremity/Trunk Assessment RUE Assessment RUE  Assessment: Within Functional Limits LUE Assessment LUE Assessment: Within Functional Limits   See Function Navigator for Current Functional Status.   Refer to Care Plan for Long Term Goals  Recommendations for other services: Neuropsych   Discharge Criteria: Patient will be discharged from OT if patient refuses treatment 3 consecutive times without medical reason, if treatment goals not met, if there is a change in medical status, if patient makes no progress towards goals or if patient is discharged from hospital.  The above assessment, treatment plan, treatment alternatives and goals were discussed and mutually agreed upon: by patient  Nicoletta Ba 12/27/2016, 2:42 PM

## 2016-12-27 NOTE — Evaluation (Signed)
Physical Therapy Assessment and Plan  Patient Details  Name: Katrina [name] MRN: [id number] Date of Birth: Jun 04, 1950  PT Diagnosis: Abnormality of gait, Ataxia, Ataxic gait, Coordination disorder, Difficulty walking, Impaired sensation, Low back pain, Muscle weakness, Paraplegia, Pain in back, Paralysis and Post laminectomy syndrom Rehab Potential: Good ELOS: 14-16 days   Today's Date: 12/27/2016 PT Individual Time: 0800-0900 PT Individual Time Calculation (min): 60 min    Problem List:  Patient Active Problem List   Diagnosis Date Noted  . 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 Paraparesis ([place]) 12/19/2016  . Chest wall mass 12/19/2016  . 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 S/P lumbar laminectomy 12/18/2016  . Numbness and tingling 12/13/2016  . Numbness and tingling of both lower extremities 12/12/2016  . Hypertension 12/12/2016  . Anemia 12/12/2016  . Abdominal lymphadenopathies 12/12/2016  . Renal mass, right 12/12/2016  . Constipation 12/12/2016  . FH: colon cancer 10/22/2013  . RUQ discomfort 10/22/2013    Past Medical History:  Past Medical History:  Diagnosis Date  . Anemia   . Arthritis   . 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 Hypertension   . Lymphadenopathy 11/2016   "extensive"  . Paresthesia of lower extremity 11/2016   bilateral  . Pulmonary nodules 11/2016  . Right renal mass 11/2016   Past Surgical History:  Past Surgical History:  Procedure Laterality Date  . ABDOMINAL HYSTERECTOMY    . BREAST LUMPECTOMY     left  . SHOULDER SURGERY     right    Assessment & Plan Clinical Impression: Katrina [name] a 66 y.o.femalewho was admitted to [name] on 10/25 - 12/14/16 with generalized weakness, back pain and BLE numbness/weakness with difficulty walking. She was found to have extensive diffuse lymphadenopathy, LUL and left chest wall metastases, right renal mass and pathologic left rib fractures. She was discharged to home  with walker and plans to follow up with Hem/onc but continued to have progressive RLE weakness with foot drop and ascending numbness to lower abdomen. 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 She was evaluated by Katrina [name] and taken to OR the same day for decompressive thoracic laminectomy from T5-T8 for resection of tumor. Pathology revealed diffuse large B-cell lymphoma and Katrina [name] consulted for input and recommend starting Katrina round of chemo Nov 19th or sooner if doing well. Interventional radiology consulted to place [name] and XRT to begin 2 weeks post op. Patient with paraplegia and significant deficits in mobility as well as ability to carry out ADL tasks. CIRwasrecommended by rehab team. Patient transferred to CIR on 12/26/2016 .   Patient currently requires mod with mobility secondary to muscle weakness and muscle paralysis, decreased cardiorespiratoy endurance, unbalanced muscle activation, ataxia and decreased coordination and decreased sitting balance, decreased standing balance, decreased postural control, decreased balance strategies and difficulty maintaining precautions.  Prior to hospitalization, patient was independent  with mobility and lived with   in a   home.  Home access is   .  Patient will benefit from skilled PT intervention to maximize safe functional mobility, minimize fall risk and decrease caregiver burden for planned discharge home with 24 hour supervision.  Anticipate patient will benefit from follow up [place] at discharge.  PT - End of Session Activity Tolerance: Tolerates 30+ min  activity with multiple rests Endurance Deficit: Yes Endurance Deficit Description: fatigues quickly with mobility activities PT Assessment Rehab Potential (ACUTE/IP ONLY): Good PT Barriers to Discharge: Inaccessible home  environment;Pending chemo/radiation PT Barriers to Discharge Comments: have recommended pt discuss building ramp with family PT Patient demonstrates impairments in the following area(s): Balance;Sensory;Skin Integrity;Endurance;Motor;Pain;Safety PT Transfers Functional Problem(s): Bed Mobility;Bed to Chair;Car;Furniture PT Locomotion Functional Problem(s): Ambulation;Wheelchair Mobility PT Plan PT Intensity: Minimum of 1-2 x/day ,45 to 90 minutes PT Frequency: 5 out of 7 days PT Duration Estimated Length of Stay: 14-16 days PT Treatment/Interventions: Ambulation/gait training;Discharge planning;Functional mobility training;Psychosocial support;Therapeutic Activities;Wheelchair propulsion/positioning;Therapeutic Exercise;Skin care/wound management;Neuromuscular re-education;Disease management/prevention;Balance/vestibular training;DME/adaptive equipment instruction;Pain management;Splinting/orthotics;UE/LE Strength taining/ROM;UE/LE Coordination activities;Stair training;Patient/family education;Functional electrical stimulation;Community reintegration PT Transfers Anticipated Outcome(s): S PT Locomotion Anticipated Outcome(s): S with LRAD PT Recommendation Recommendations for Other Services: Neuropsych consult;Therapeutic Recreation consult Therapeutic Recreation Interventions: Pet therapy;Kitchen group;Outing/community reintergration Follow Up Recommendations: Outpatient PT Patient destination: Home Equipment Details: has rollator, RW, w/c (needs to be measured to assess fit)  Skilled Therapeutic Intervention Pt received supine in bed, denies pain and agreeable to treatment. PT initial evaluation performed and completed with min/modA overall d/t RLE strength/coordination and LLE coordination deficits with significant ataxia and impaired proprioception. Educated pt in rehab process, goals, estimated length of stay to be determined after all evaluations completed. Remained on BSC at end of session  with instruction to call when finished so nursing staff can assist pt to chair; pt verbalizes understanding.   PT Evaluation Precautions/Restrictions Precautions Precautions: Fall;Back Precaution Booklet Issued: Yes (comment) Precaution Comments: Able to state 3/3 back precautions; maintains precautions during mobility with min cues Restrictions Weight Bearing Restrictions: No General Chart Reviewed: Yes Response to Previous Treatment: Not applicable Family/Caregiver Present: No Vital SignsTherapy Vitals Temp: 98.4 F (36.9 C) Temp Source: Oral Pulse Rate: 93 Resp: 18 BP: 111/72 Patient Position (if appropriate): Lying Oxygen Therapy SpO2: 98 % O2 Device: Not Delivered Pain  c/o 3/10 pain in back; pre-medicated Home Living/Prior Functioning Home Living Available Help at Discharge: Family Type of Home: House Home Access: Stairs to enter [employer] of Steps: 3 in the front and in the back - plan on building a ramp on the back Entrance Stairs-Rails: None Home Layout: One level Bathroom Shower/Tub: Tub/shower unit;Curtain Bathroom Toilet: Standard Additional Comments: Been staying at her son's house since 12/14/16  Lives With: Son Vision/Perception  Perception Perception: Within Functional Limits Praxis Praxis: Impaired Praxis Impairment Details: Motor planning  Cognition Overall Cognitive Status: Within Functional Limits for tasks assessed Arousal/Alertness: Awake/alert Memory: Appears intact Awareness: Appears intact Problem Solving: Appears intact Safety/Judgment: Appears intact Sensation Sensation Light Touch: Impaired Detail Light Touch Impaired Details: Impaired RLE;Impaired LLE Hot/Cold: Impaired by gross assessment Proprioception: Impaired Detail Proprioception Impaired Details: Impaired RLE;Impaired LLE(impaired R hallux, 4/5 correct at ankle, absent at L hallux, ankle and knee) Coordination Gross Motor Movements are Fluid and Coordinated:  No Fine Motor Movements are Fluid and Coordinated: Yes Heel Shin Test: impaired BLE; RLE decreased excursion Motor  Motor Motor: Paraplegia;Ataxia  Mobility Bed Mobility Bed Mobility: Supine to Sit Supine to Sit: 5: Supervision;With rails;HOB elevated Supine to Sit Details: Verbal cues for technique Transfers Sit to Stand: 3: Mod assist Sit to Stand Details: Verbal cues for technique;Verbal cues for precautions/safety;Tactile cues for weight shifting;Tactile cues for placement;Tactile cues for posture Stand to Sit: 4: Min assist Stand to Sit Details (indicate cue type and reason): Verbal cues for sequencing;Verbal cues for technique;Tactile cues for weight beaing;Tactile cues for weight shifting;Tactile  cues for placement Stand Pivot Transfers: 3: Mod assist Stand Pivot Transfer Details: Verbal cues for precautions/safety;Verbal cues for technique;Verbal cues for sequencing Locomotion  Ambulation Ambulation: Yes Ambulation/Gait Assistance: 4: Min assist;Other (comment)(+2 w/c follow) Ambulation Distance (Feet): 10 Feet Assistive device: Rolling walker Ambulation/Gait Assistance Details: Verbal cues for technique;Verbal cues for precautions/safety;Tactile cues for placement;Tactile cues for sequencing;Tactile cues for posture;Verbal cues for gait pattern Gait Gait: Yes Gait Pattern: Impaired Gait Pattern: Ataxic;Right genu recurvatum;Left genu recurvatum;Poor foot clearance - right;Poor foot clearance - left Gait velocity: significantly decreased Stairs / Additional Locomotion Stairs: No Architect: Yes Wheelchair Assistance: 5: Investment banker, operational Details: Verbal cues for Marketing executive: Both upper extremities Wheelchair Parts Management: Needs assistance Distance: 100'  Trunk/Postural Assessment  Cervical Assessment Cervical Assessment: Within Functional Limits Thoracic Assessment Thoracic Assessment:  (restrictions due to back precautions) Lumbar Assessment Lumbar Assessment: Within Functional Limits Postural Control Postural Control: Within Functional Limits  Balance Balance Balance Assessed: Yes Dynamic Sitting Balance Sitting balance - Comments: Maintains sitting balance at EOB and on BSC Static Standing Balance Static Standing - Balance Support: During functional activity Static Standing - Level of Assistance: 3: Mod assist Dynamic Standing Balance Dynamic Standing - Balance Support: During functional activity Dynamic Standing - Level of Assistance: 3: Mod assist Extremity Assessment  RUE Assessment RUE Assessment: Within Functional Limits LUE Assessment LUE Assessment: Within Functional Limits RLE Assessment RLE Assessment: Exceptions to [facility] RLE Strength Right Hip Flexion: 2/5 Right Hip Extension: 3-/5 Right Hip ABduction: 3-/5 Right Hip ADduction: 3/5 Right Knee Flexion: 4-/5 Right Knee Extension: 4/5 Right Ankle Dorsiflexion: 3-/5 Right Ankle Plantar Flexion: 4/5 LLE Assessment LLE Assessment: Within Functional Limits(grossly 4+/5 throughout)   See Function Navigator for Current Functional Status.   Refer to Care Plan for Long Term Goals  Recommendations for other services: Neuropsych and Therapeutic Recreation  Pet therapy, Kitchen group and Outing/community reintegration  Discharge Criteria: Patient will be discharged from PT if patient refuses treatment 3 consecutive times without medical reason, if treatment goals not met, if there is a change in medical status, if patient makes no progress towards goals or if patient is discharged from [name].  The above assessment, treatment plan, treatment alternatives and goals were discussed and mutually agreed upon: by patient  Luberta Mutter 12/27/2016, 8:58 AM

## 2016-12-27 NOTE — Progress Notes (Signed)
START OFF PATHWAY REGIMEN - Lymphoma and CLL   [id number] q21 days:   A cycle is every 21 days:     Rituximab      Etoposide      Doxorubicin      Vincristine      Cyclophosphamide      Prednisone   **Always confirm dose/schedule in your pharmacy ordering system**    Patient Characteristics: Diffuse Large B-Cell Lymphoma, First Line, Stage III and IV Disease Type: Not Applicable Disease Type: Diffuse Large B-Cell Lymphoma Disease Type: Not Applicable Line of therapy: First Line [place] Stage: IV Intent of Therapy: Curative Intent, Discussed with Patient

## 2016-12-27 NOTE — Progress Notes (Signed)
Physical Therapy Session Note  Patient Details  Name: Katrina [name] MRN: [id number] Date of Birth: Sep 03, 1950  Today's Date: 12/27/2016 PT Individual Time: 1400-1515 PT Individual Time Calculation (min): 75 min   Short Term Goals: Week 1:  PT Short Term Goal 1 (Week 1): Pt will demonstrate bed mobility consistent S PT Short Term Goal 2 (Week 1): Pt will demonstrate transfer w/c <>bed with consistent minA PT Short Term Goal 3 (Week 1): Pt will ambulate x50' minA PT Short Term Goal 4 (Week 1): Pt will demonstrate dynamic standing balance x5 min with minA  Skilled Therapeutic Interventions/Progress Updates: Pt received seated in recliner, c/o 2/10 pain in back pre-medicated, and agreeable to treatment. Stand pivot transfer modA with RW to w/c d/t LE ataxia and poor foot placement. W/c propulsion x100' BUE for strengthening and endurance. Stand pivot transfer w/c <>mat table min/modA with RW.  Sit <>stand 2x5 reps with light UE support. Standing alternating toe taps to colored targets in front of pt, and to cone for BLE coordination; min guard overall with occasional posterior LOBs recovered with minA. Cues for glute activation in stance, reduced reliance of LEs on mat table. Sit <>supine minA for LE management. BLE PNF D2 flexion/extension rhythmic initiation progressed to mod resistance provided, quick stretch needed to facilitate R hip flexion. Hooklying abdominal draw in with hip adduction isometric on ball for LE stability, 2x10-15 reps for focus on core strengthening. Returned to w/c as above, transported to room totalA. Stand pivot transfer w/c>BSC with minA and improved LE placement. Remained on [facility] with call bell in reach.    Therapy Documentation Precautions:  Precautions Precautions: Fall, Back Precaution Booklet Issued: Yes (comment) Precaution Comments: Able to state 3/3 back precautions; maintains precautions during mobility with min cues Restrictions Weight Bearing Restrictions:  No   See Function Navigator for Current Functional Status.   Therapy/Group: Individual Therapy  Luberta Mutter 12/27/2016, 4:20 PM

## 2016-12-27 NOTE — Progress Notes (Signed)
Patient information reviewed and entered into eRehab system by Kelicia Youtz, RN, CRRN, PPS Coordinator.  Information including medical coding and functional independence measure will be reviewed and updated through discharge.     Per nursing patient was given "Data Collection Information Summary for Patients in Inpatient Rehabilitation Facilities with attached "Privacy Act Statement-Health Care Records" upon admission.  

## 2016-12-27 NOTE — Progress Notes (Signed)
Subjective/Complaints: Appreciate Onc note Patient slept well last night.  She is eager to start her therapies today.  No significant pains. ROS: Denies chest pain, shortness of breath, nausea, vomiting, diarrhea, constipation Objective: Vital Signs: Blood pressure 111/72, pulse 93, temperature 98.4 F (36.9 C), temperature source Oral, resp. rate 18, SpO2 98 %. No results found. Results for orders placed or performed during the hospital encounter of 12/26/16 (from the past 72 hour(s))  Comprehensive metabolic panel     Status: Abnormal   Collection Time: 12/27/16  4:11 AM  Result Value Ref Range   Sodium 131 (L) 135 - 145 mmol/L   Potassium 3.6 3.5 - 5.1 mmol/L   Chloride 96 (L) 101 - 111 mmol/L   CO2 26 22 - 32 mmol/L   Glucose, Bld 123 (H) 65 - 99 mg/dL   BUN 26 (H) 6 - 20 mg/dL   Creatinine, Ser 1.01 (H) 0.44 - 1.00 mg/dL   Calcium 8.5 (L) 8.9 - 10.3 mg/dL   Total Protein 6.1 (L) 6.5 - 8.1 g/dL   Albumin 3.2 (L) 3.5 - 5.0 g/dL   AST 22 15 - 41 U/L   ALT 26 14 - 54 U/L   Alkaline Phosphatase 51 38 - 126 U/L   Total Bilirubin 0.4 0.3 - 1.2 mg/dL   GFR calc non Af Amer 58 (L) >60 mL/min   GFR calc Af Amer >60 >60 mL/min    Comment: (NOTE) The eGFR has been calculated using the CKD EPI equation. This calculation has not been validated in all clinical situations. eGFR's persistently <60 mL/min signify possible Chronic Kidney Disease.    Anion gap 9 5 - 15  CBC WITH DIFFERENTIAL     Status: Abnormal   Collection Time: 12/27/16  4:11 AM  Result Value Ref Range   WBC 14.3 (H) 4.0 - 10.5 K/uL   RBC 3.77 (L) 3.87 - 5.11 MIL/uL   Hemoglobin 10.1 (L) 12.0 - 15.0 g/dL   HCT 30.6 (L) 36.0 - 46.0 %   MCV 81.2 78.0 - 100.0 fL   MCH 26.8 26.0 - 34.0 pg   MCHC 33.0 30.0 - 36.0 g/dL   RDW 14.8 11.5 - 15.5 %   Platelets 371 150 - 400 K/uL   Neutrophils Relative % 80 %   Neutro Abs 11.4 (H) 1.7 - 7.7 K/uL   Lymphocytes Relative 13 %   Lymphs Abs 1.8 0.7 - 4.0 K/uL   Monocytes  Relative 7 %   Monocytes Absolute 1.1 (H) 0.1 - 1.0 K/uL   Eosinophils Relative 0 %   Eosinophils Absolute 0.0 0.0 - 0.7 K/uL   Basophils Relative 0 %   Basophils Absolute 0.0 0.0 - 0.1 K/uL     HEENT: normal Cardio: RRR and No murmur Resp: CTA B/L and Unlabored GI: BS positive and Nontender, nondistended Extremity:  Edema 1+ pedal and ankle edema Skin:   Wound C/D/I Neuro: Alert/Oriented, Abnormal Sensory Paresthesias below T8 however has intact light touch sensation, Abnormal Motor 5/5 bilateral deltoid, bicep, tricep, grip, 3- in the left hip flexor knee extensor ankle dorsiflexor 2 in the right hip flexor knee extensor ankle dorsiflexor and Reflexes: 0 Musc/Skel:  Other No pain with lower extremity range of motion no pain with upper extremity range of motion no significant back pain when turning in bed General no acute distress   Assessment/Plan: 1. 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 Physiatrist is providing close team supervision and 24 hour management of active medical problems listed below. Physiatrist and rehab team continue to assess barriers to discharge/monitor patient progress toward functional and medical goals. FIM:       Function - Toileting Toileting steps completed by patient: Performs perineal hygiene, Adjust clothing after toileting, Adjust clothing prior to toileting Toileting Assistive Devices: Grab bar or rail     Function - Chair/bed transfer Chair/bed transfer method: Stand pivot Chair/bed transfer assist level: Moderate assist (Pt 50 - 74%/lift or lower) Chair/bed transfer assistive device: Walker, Armrests Chair/bed transfer details: Verbal cues for sequencing, Verbal cues for technique, Verbal cues for precautions/safety, Verbal cues for safe use of DME/AE, Manual facilitation for weight  shifting  Function - Locomotion: Wheelchair Type: Manual Max wheelchair distance: 100 Assist Level: Supervision or verbal cues Assist Level: Supervision or verbal cues Wheel 150 feet activity did not occur: Safety/medical concerns(fatigue) Function - Locomotion: Ambulation Assistive device: Walker-rolling Max distance: 10 Assist level: 2 helpers(minA, w/c follow) Assist level: 2 helpers  Function - Comprehension Comprehension: Auditory Comprehension assist level: Follows complex conversation/direction with no assist  Function - Expression Expression: Verbal Expression assist level: Expresses complex ideas: With no assist  Function - Social Interaction Social Interaction assist level: Interacts appropriately with others - No medications needed.  Function - Problem Solving Problem solving assist level: Solves complex problems: Recognizes & self-corrects  Function - Memory Memory assist level: Complete Independence: No helper Patient normally able to recall (first 3 days only): Current season, Location of own room, Staff names and faces, That he or she is in a hospital   Medical Problem List and Plan: 1. 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 DVT Prophylaxis/Anticoagulation: Pharmaceutical:Lovenoxadded as > week post op. Check dopplers in am. 3. Pain Management:oxycodone prn effective 4. Mood:LCSW to follow for evaluation and support. Reports that she is managing to deal with diagnosis 5. Neuropsych: This patientiscapable of making decisions on herown behalf. 6. Skin/Wound Care:routine pressure relief measures 7. Fluids/Electrolytes/Nutrition:Monitor I/O. Check lytes in am. 8.HTN: Monitor BP bid. Continue Norvasc and dyazide.  Blood pressure controlled 12/27/2016 Vitals:   12/26/16 1637 12/27/16 0500  BP: 135/80 111/72  Pulse: 77 93  Resp: 18 18  Temp: 97.8 F (36.6 C) 98.4 F (36.9 C)  SpO2:  98%   9. ABLA: on iron.  Repeat hgb 11/9 is stable' 10. Leucocytosis: Likely reactive due to steroids. Monitor for signs of infection.  11. AKI: Due to dehydration . Encourage fluid intake. Repeat BUN/Creat 11/9 stable 12. Hyponatremia: Will monitor for [email or web address] asymptomatic, may be related to diuretic  LOS (Days) 1 A FACE TO FACE EVALUATION WAS PERFORMED  Hadyn Blanck E 12/27/2016, 9:09 AM

## 2016-12-28 ENCOUNTER — Inpatient Hospital Stay (HOSPITAL_COMMUNITY): Payer: Medicare Other | Admitting: Occupational Therapy

## 2016-12-28 ENCOUNTER — Inpatient Hospital Stay (HOSPITAL_COMMUNITY): Payer: Medicare Other | Admitting: Physical Therapy

## 2016-12-28 MED ORDER — TRAMADOL HCL 50 MG PO TABS
50.0000 mg | ORAL_TABLET | Freq: Four times a day (QID) | ORAL | Status: DC | PRN
  Administered 2016-12-28 – 2017-01-07 (×19): 50 mg via ORAL
  Filled 2016-12-28 (×20): qty 1

## 2016-12-28 NOTE — Progress Notes (Signed)
Physical Therapy Session Note  Patient Details  Name: Katrina [name] MRN: [id number] Date of Birth: 10/31/1950  Today's Date: 12/28/2016 PT Individual Time: 0910-1009 PT Individual Time Calculation (min): 59 min     Skilled Therapeutic Interventions/Progress Updates:    Session 1:  0910-1009:  Pt reports 2/10 pain and is seated up in recliner upon therapist entering room.  Pt requests to use rest room and perform dressing prior to beginning out of room PT.  Session focused on increasing utilization of LE strength during mobility, use of visual system for LE placement during activity, and breathing exercises to manage fear of falling during function.  Pt performed recliner to w/c transfer with min to mod A using RW and then used grab bars to transfer to toilet.  Pt then required min to mod assist for UE/LE dressing.  Pt ambulated approx 30 ft in 3 bouts of 10 ft each with min assist and RW.  Pt performed sit to stand transfers for 2 x 5 with verbal cues for hand placement and to increase utilization of LE.  Pt continues with knee buckling on occasional and verbalizes fear of falling during ambulation.  Therapist reviewed diaphragmatic breathing during function for calming and focus pursposes and also external cues (pushing head to ceiling) during sit to stand.  Following session, pt required CGA for sit to stand and min A for stand to sit. Verbalized all back precautions today. Following session, pt left up in recliner chair with call bell in reach and needs met.  Vitals pre:  154/79.  Session 2:  5277-8242: Pt reports 2/10 in back.  Initial BP:  162/89; BP post: 178/86.  Nursing notified of pt elevated BP following session.  Session focused on continuing to improve safety and stability with functional mobility to decrease burden of care upon D/C.  Pt transferred with RW from chair to chair or mat to chair numerous times with min assist and then supine to sit with min assist for upper body and sit to  supine with supervision only.  Pt also worked on standing endurance while standing at table and folding washcloths.  Following session, pt seated in recliner with family present in room and needs met.  Therapy Documentation Precautions:  Precautions Precautions: Fall, Back Precaution Booklet Issued: Yes (comment) Precaution Comments: Able to state 3/3 back precautions; maintains precautions during mobility with min cues Restrictions Weight Bearing Restrictions: No      See Function Navigator for Current Functional Status.   Therapy/Group: Individual Therapy  Elazar Argabright Hilario Quarry 12/28/2016, 10:48 AM

## 2016-12-28 NOTE — Progress Notes (Signed)
Occupational Therapy Session Note  Patient Details  Name: Katrina [name] MRN: [id number] Date of Birth: March 17, 1950  Today's Date: 12/28/2016 OT Individual Time: 1102-1200 OT Individual Time Calculation (min): 58 min    Short Term Goals: Week 1:  OT Short Term Goal 1 (Week 1): Pt will transfer to BSC/ toilet with min A with LRAD OT Short Term Goal 2 (Week 1): Pt will don UB clothing mod I  OT Short Term Goal 3 (Week 1): Pt will dress LB with mod a including shoes OT Short Term Goal 4 (Week 1): Pt will transfer into tub shower with tub bench with min A.     Skilled Therapeutic Interventions/Progress Updates:    Tx focus on adaptive bathing/dressing skills, adherence to precautions, activity tolerance, and standing endurance.   Pt greeted in recliner. Requesting shower. She completed stand pivot<w/c<TTB with Mod A. Incision site covered. Pt bathing with overall Mod A for sit<stand during pericare completion. Pt using LH sponge for reaching LEs and back. She then proceeded to dress w/c level at sink. Trained her on use of reacher and sock aide. She demonstrate carryover of appropriate use with supervision. Pt completed 2 grooming tasks in standing without rest! She was very proud of this. Once she returned to w/c, pt assisted in bedmaking tasks with OT per allowance of her back precautions.  Focus on activity tolerance and pt recognizing limitations of her precautions during functional tasks. At end of tx pt ambulated 5 feet back to recliner. 1 LOB into recliner due to R LE buckling prior to sitting. Pt was repositioned for comfort and left with all needs within reach.   Therapy Documentation Precautions:  Precautions Precautions: Fall, Back Precaution Booklet Issued: Yes (comment) Precaution Comments: Able to state 3/3 back precautions; maintains precautions during mobility with min cues Restrictions Weight Bearing Restrictions: No Pain: No c/o pain during tx. Notified RN of pts  requesting for pain medication post tx in prep for afternoon therapy Pain Assessment Pain Assessment: 0-10 Pain Score: 3  Pain Type: Acute pain Pain Location: Back Pain Orientation: Lower Pain Intervention(s): MD notified (Comment) ADL: ADL ADL Comments: see functional navigator     See Function Navigator for Current Functional Status.  Therapy/Group: Individual Therapy  Darah Simkin A Idrees Quam 12/28/2016, 12:40 PM

## 2016-12-28 NOTE — Progress Notes (Signed)
Physical Therapy Session Note  Patient Details  Name: Katrina [name] MRN: [id number] Date of Birth: 1950-08-20  Today's Date: 12/28/2016 PT Individual Time: 1694-5038 PT Individual Time Calculation (min): 45 min   Short Term Goals: Week 1:  PT Short Term Goal 1 (Week 1): Pt will demonstrate bed mobility consistent S PT Short Term Goal 2 (Week 1): Pt will demonstrate transfer w/c <>bed with consistent minA PT Short Term Goal 3 (Week 1): Pt will ambulate x50' minA PT Short Term Goal 4 (Week 1): Pt will demonstrate dynamic standing balance x5 min with minA  Skilled Therapeutic Interventions/Progress Updates:   Pt received sitting in recliner and agreeable to PT. Stand pivot transfer to the [facility] with mod assist fading to total assist due to L knee collapse. Re-attempted stand pivot transfer without AD and mod assist from PT.  Stand pivot transfer with RW x 2 to and from Nustep with min-mod assist from PT and min cues for AD management.   Nustep reciprocal movement training x 6 min +4 min with supervisoin assist from PT with cues for SPM and improved R LE control.   Gait training instructed by PT with BUE support in parallel bars forward and backward x 10 ft with min-mod assist. Cues for gait pattern including increased BOS and step length.           Therapy Documentation Precautions:  Precautions Precautions: Fall, Back Precaution Booklet Issued: Yes (comment) Precaution Comments: Able to state 3/3 back precautions; maintains precautions during mobility with min cues Restrictions Weight Bearing Restrictions: No Vital Signs: Therapy Vitals Temp: 98 F (36.7 C) Temp Source: Oral Pulse Rate: (!) 52 Resp: 18 BP: (!) 150/77 Patient Position (if appropriate): Lying Oxygen Therapy SpO2: 96 % O2 Device: Not Delivered   See Function Navigator for Current Functional Status.   Therapy/Group: Individual Therapy  Lorie Phenix 12/28/2016, 2:23 PM

## 2016-12-28 NOTE — Progress Notes (Signed)
Subjective/Complaints: , According to nurse, had some back pain last night that was not relieved with Tylenol. , No change in lower extremity weakness, still feels more sensation on the left side than on the right side in the lower extremities.. ROS: Denies chest pain, shortness of breath, nausea, vomiting, diarrhea, constipation Objective: Vital Signs: Blood pressure 137/76, pulse 64, temperature 97.6 F (36.4 C), temperature source Oral, resp. rate 18, SpO2 99 %. No results found. Results for orders placed or performed during the hospital encounter of 12/26/16 (from the past 72 hour(s))  Comprehensive metabolic panel     Status: Abnormal   Collection Time: 12/27/16  4:11 AM  Result Value Ref Range   Sodium 131 (L) 135 - 145 mmol/L   Potassium 3.6 3.5 - 5.1 mmol/L   Chloride 96 (L) 101 - 111 mmol/L   CO2 26 22 - 32 mmol/L   Glucose, Bld 123 (H) 65 - 99 mg/dL   BUN 26 (H) 6 - 20 mg/dL   Creatinine, Ser 1.01 (H) 0.44 - 1.00 mg/dL   Calcium 8.5 (L) 8.9 - 10.3 mg/dL   Total Protein 6.1 (L) 6.5 - 8.1 g/dL   Albumin 3.2 (L) 3.5 - 5.0 g/dL   AST 22 15 - 41 U/L   ALT 26 14 - 54 U/L   Alkaline Phosphatase 51 38 - 126 U/L   Total Bilirubin 0.4 0.3 - 1.2 mg/dL   GFR calc non Af Amer 58 (L) >60 mL/min   GFR calc Af Amer >60 >60 mL/min    Comment: (NOTE) The eGFR has been calculated using the CKD EPI equation. This calculation has not been validated in all clinical situations. eGFR's persistently <60 mL/min signify possible Chronic Kidney Disease.    Anion gap 9 5 - 15  CBC WITH DIFFERENTIAL     Status: Abnormal   Collection Time: 12/27/16  4:11 AM  Result Value Ref Range   WBC 14.3 (H) 4.0 - 10.5 K/uL   RBC 3.77 (L) 3.87 - 5.11 MIL/uL   Hemoglobin 10.1 (L) 12.0 - 15.0 g/dL   HCT 30.6 (L) 36.0 - 46.0 %   MCV 81.2 78.0 - 100.0 fL   MCH 26.8 26.0 - 34.0 pg   MCHC 33.0 30.0 - 36.0 g/dL   RDW 14.8 11.5 - 15.5 %   Platelets 371 150 - 400 K/uL   Neutrophils Relative % 80 %   Neutro  Abs 11.4 (H) 1.7 - 7.7 K/uL   Lymphocytes Relative 13 %   Lymphs Abs 1.8 0.7 - 4.0 K/uL   Monocytes Relative 7 %   Monocytes Absolute 1.1 (H) 0.1 - 1.0 K/uL   Eosinophils Relative 0 %   Eosinophils Absolute 0.0 0.0 - 0.7 K/uL   Basophils Relative 0 %   Basophils Absolute 0.0 0.0 - 0.1 K/uL     HEENT: normal Cardio: RRR and No murmur Resp: CTA B/L and Unlabored GI: BS positive and Nontender, nondistended Extremity:  Edema 1+ pedal and ankle edema Skin:   Wound C/D/I Neuro: Alert/Oriented, Abnormal Sensory Paresthesias below T8 however has intact light touch sensation, Abnormal Motor 5/5 bilateral deltoid, bicep, tricep, grip, 3- in the left hip flexor knee extensor ankle dorsiflexor 2 in the right hip flexor knee extensor ankle dorsiflexor and Reflexes: 0 at the ankles, 3 plus at the knees Musc/Skel:  Other No pain with lower extremity range of motion no pain with upper extremity range of motion no significant back pain when turning in bed General no acute  distress   Assessment/Plan: 1. 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 Physiatrist is providing close team supervision and 24 hour management of active medical problems listed below. Physiatrist and rehab team continue to assess barriers to discharge/monitor patient progress toward functional and medical goals. FIM: Function - Bathing Position: Shower Body parts bathed by patient: Right arm, Left arm, Chest, Right upper leg, Left upper leg Body parts bathed by helper: Abdomen, Front perineal area, Buttocks, Right lower leg, Left lower leg, Back Assist Level: Touching or steadying assistance(Pt > 75%)  Function- Upper Body Dressing/Undressing What is the patient wearing?: Pull over shirt/dress Pull over shirt/dress - Perfomed by patient: Thread/unthread right sleeve, Thread/unthread left  sleeve, Put head through opening Pull over shirt/dress - Perfomed by helper: Pull shirt over trunk Assist Level: Supervision or verbal cues Function - Lower Body Dressing/Undressing What is the patient wearing?: Non-skid slipper socks, Pants Position: Wheelchair/chair at sink Pants- Performed by helper: Thread/unthread right pants leg, Thread/unthread left pants leg, Pull pants up/down Non-skid slipper socks- Performed by helper: Don/doff right sock, Don/doff left sock Assist for footwear: Supervision/touching assist Assist for lower body dressing: Supervision or verbal cues  Function - Toileting Toileting steps completed by patient: Performs perineal hygiene, Adjust clothing after toileting, Adjust clothing prior to toileting Toileting steps completed by helper: Adjust clothing prior to toileting, Performs perineal hygiene, Adjust clothing after toileting Toileting Assistive Devices: Grab bar or rail Assist level: Touching or steadying assistance (Pt.75%)  Function - Toilet Transfers Assist level to toilet: Moderate assist (Pt 50 - 74%/lift or lower) Assist level from toilet: Moderate assist (Pt 50 - 74%/lift or lower)  Function - Chair/bed transfer Chair/bed transfer method: Stand pivot Chair/bed transfer assist level: Touching or steadying assistance (Pt > 75%) Chair/bed transfer assistive device: Armrests, Walker Chair/bed transfer details: Verbal cues for precautions/safety, Verbal cues for safe use of DME/AE, Verbal cues for technique, Verbal cues for sequencing  Function - Locomotion: Wheelchair Type: Manual Max wheelchair distance: 100 Assist Level: Supervision or verbal cues Assist Level: Supervision or verbal cues Wheel 150 feet activity did not occur: Safety/medical concerns(fatigue) Function - Locomotion: Ambulation Assistive device: Walker-rolling Max distance: (10 ft) Assist level: Touching or steadying assistance (Pt > 75%) Assist level: Touching or steadying  assistance (Pt > 75%) Walk 50 feet with 2 turns activity did not occur: Safety/medical concerns Walk 150 feet activity did not occur: Safety/medical concerns Walk 10 feet on uneven surfaces activity did not occur: Safety/medical concerns  Function - Comprehension Comprehension: Auditory Comprehension assist level: Follows complex conversation/direction with no assist  Function - Expression Expression: Verbal Expression assist level: Expresses complex ideas: With no assist  Function - Social Interaction Social Interaction assist level: Interacts appropriately with others - No medications needed.  Function - Problem Solving Problem solving assist level: Solves complex problems: Recognizes & self-corrects  Function - Memory Memory assist level: Complete Independence: No helper Patient normally able to recall (first 3 days only): Current season, Location of own room, Staff names and faces, That he or she is in a hospital   Medical Problem List and Plan: 1. 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 DVT Prophylaxis/Anticoagulation: Pharmaceutical:Lovenoxadded as > week post op. Check dopplers in am. 3. Pain Management:oxycodone prn effective 4. Mood:LCSW  to follow for evaluation and support. Reports that she is managing to deal with diagnosis 5. Neuropsych: This patientiscapable of making decisions on herown behalf. 6. Skin/Wound Care:routine pressure relief measures 7. Fluids/Electrolytes/Nutrition:Monitor I/O. Low oral intake 600 mL yesterday, encourage fluids 8.HTN: Monitor BP bid. Continue Norvasc and dyazide.  Blood pressure controlled 12/28/2016 Vitals:   12/27/16 1623 12/28/16 0544  BP: (!) 157/65 137/76  Pulse: 80 64  Resp: 18 18  Temp: 98 F (36.7 C) 97.6 F (36.4 C)  SpO2: 97% 99%   9. ABLA: on iron.  Repeat hgb 11/9 is stable' 10. Leucocytosis: Likely reactive due to steroids. Monitor for signs of infection.  11. AKI: Due to dehydration . Encourage fluid intake. Repeat BUN/Creat 11/9 stable 12. Hyponatremia: Will monitor for [email or web address] asymptomatic, may be related to diuretic  LOS (Days) 2 A FACE TO FACE EVALUATION WAS PERFORMED  Travis Purk E 12/28/2016, 10:48 AM

## 2016-12-29 ENCOUNTER — Inpatient Hospital Stay (HOSPITAL_COMMUNITY): Payer: Medicare Other

## 2016-12-29 ENCOUNTER — Inpatient Hospital Stay (HOSPITAL_COMMUNITY): Payer: Medicare Other | Admitting: Occupational Therapy

## 2016-12-29 DIAGNOSIS — R609 Edema, unspecified: Secondary | ICD-10-CM

## 2016-12-29 NOTE — Progress Notes (Signed)
Subjective/Complaints:  ROS: Denies chest pain, shortness of breath, nausea, vomiting, diarrhea, constipation Objective: Vital Signs: Blood pressure 137/68, pulse 64, temperature 98 F (36.7 C), temperature source Oral, resp. rate 18, SpO2 99 %. No results found. Results for orders placed or performed during the hospital encounter of 12/26/16 (from the past 72 hour(s))  Comprehensive metabolic panel     Status: Abnormal   Collection Time: 12/27/16  4:11 AM  Result Value Ref Range   Sodium 131 (L) 135 - 145 mmol/L   Potassium 3.6 3.5 - 5.1 mmol/L   Chloride 96 (L) 101 - 111 mmol/L   CO2 26 22 - 32 mmol/L   Glucose, Bld 123 (H) 65 - 99 mg/dL   BUN 26 (H) 6 - 20 mg/dL   Creatinine, Ser 1.01 (H) 0.44 - 1.00 mg/dL   Calcium 8.5 (L) 8.9 - 10.3 mg/dL   Total Protein 6.1 (L) 6.5 - 8.1 g/dL   Albumin 3.2 (L) 3.5 - 5.0 g/dL   AST 22 15 - 41 U/L   ALT 26 14 - 54 U/L   Alkaline Phosphatase 51 38 - 126 U/L   Total Bilirubin 0.4 0.3 - 1.2 mg/dL   GFR calc non Af Amer 58 (L) >60 mL/min   GFR calc Af Amer >60 >60 mL/min    Comment: (NOTE) The eGFR has been calculated using the CKD EPI equation. This calculation has not been validated in all clinical situations. eGFR's persistently <60 mL/min signify possible Chronic Kidney Disease.    Anion gap 9 5 - 15  CBC WITH DIFFERENTIAL     Status: Abnormal   Collection Time: 12/27/16  4:11 AM  Result Value Ref Range   WBC 14.3 (H) 4.0 - 10.5 K/uL   RBC 3.77 (L) 3.87 - 5.11 MIL/uL   Hemoglobin 10.1 (L) 12.0 - 15.0 g/dL   HCT 30.6 (L) 36.0 - 46.0 %   MCV 81.2 78.0 - 100.0 fL   MCH 26.8 26.0 - 34.0 pg   MCHC 33.0 30.0 - 36.0 g/dL   RDW 14.8 11.5 - 15.5 %   Platelets 371 150 - 400 K/uL   Neutrophils Relative % 80 %   Neutro Abs 11.4 (H) 1.7 - 7.7 K/uL   Lymphocytes Relative 13 %   Lymphs Abs 1.8 0.7 - 4.0 K/uL   Monocytes Relative 7 %   Monocytes Absolute 1.1 (H) 0.1 - 1.0 K/uL   Eosinophils Relative 0 %   Eosinophils Absolute 0.0 0.0 - 0.7  K/uL   Basophils Relative 0 %   Basophils Absolute 0.0 0.0 - 0.1 K/uL     HEENT: normal Cardio: RRR and No murmur Resp: CTA B/L and Unlabored GI: BS positive and Nontender, nondistended Extremity:  Edema 1+ pedal and ankle edema Skin:   Wound C/D/I Neuro: Alert/Oriented, Abnormal Sensory Paresthesias below T8 however has intact light touch sensation, Abnormal Motor 5/5 bilateral deltoid, bicep, tricep, grip, 3- in the left hip flexor knee extensor ankle dorsiflexor 2 in the right hip flexor knee extensor ankle dorsiflexor and Reflexes: 0 at the ankles, 3 plus at the knees Musc/Skel:  Other No pain with lower extremity range of motion no pain with upper extremity range of motion no significant back pain when turning in bed General no acute distress   Assessment/Plan: 1. 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 Physiatrist is providing close team supervision and 24  hour management of active medical problems listed below. Physiatrist and rehab team continue to assess barriers to discharge/monitor patient progress toward functional and medical goals. FIM: Function - Bathing Position: Shower Body parts bathed by patient: Right arm, Left arm, Chest, Right upper leg, Left upper leg, Abdomen, Front perineal area, Buttocks Body parts bathed by helper: Back Assist Level: Touching or steadying assistance(Pt > 75%), Assistive device Assistive Device Comment: LH sponge  Function- Upper Body Dressing/Undressing What is the patient wearing?: Pull over shirt/dress Pull over shirt/dress - Perfomed by patient: Thread/unthread right sleeve, Thread/unthread left sleeve, Put head through opening, Pull shirt over trunk Pull over shirt/dress - Perfomed by helper: Pull shirt over trunk Assist Level: Supervision or verbal cues Function - Lower Body  Dressing/Undressing What is the patient wearing?: Non-skid slipper socks, Pants Position: Wheelchair/chair at sink Pants- Performed by patient: Thread/unthread right pants leg, Thread/unthread left pants leg, Pull pants up/down Pants- Performed by helper: Thread/unthread right pants leg, Thread/unthread left pants leg, Pull pants up/down Non-skid slipper socks- Performed by patient: Don/doff right sock, Don/doff left sock Non-skid slipper socks- Performed by helper: Don/doff right sock, Don/doff left sock Assist for footwear: Supervision/touching assist Assist for lower body dressing: Touching or steadying assistance (Pt > 75%), Assistive device Assistive Device Comment: reacher + sock aide  Function - Toileting Toileting steps completed by patient: Performs perineal hygiene Toileting steps completed by helper: Adjust clothing prior to toileting, Adjust clothing after toileting Toileting Assistive Devices: Grab bar or rail Assist level: Touching or steadying assistance (Pt.75%)  Function - Toilet Transfers Assist level to toilet: Moderate assist (Pt 50 - 74%/lift or lower) Assist level from toilet: Moderate assist (Pt 50 - 74%/lift or lower)  Function - Chair/bed transfer Chair/bed transfer method: Stand pivot Chair/bed transfer assist level: Touching or steadying assistance (Pt > 75%) Chair/bed transfer assistive device: Armrests, Walker Chair/bed transfer details: Verbal cues for precautions/safety, Verbal cues for safe use of DME/AE, Verbal cues for technique, Verbal cues for sequencing  Function - Locomotion: Wheelchair Type: Manual Max wheelchair distance: 100 Assist Level: Supervision or verbal cues Assist Level: Supervision or verbal cues Wheel 150 feet activity did not occur: Safety/medical concerns(fatigue) Function - Locomotion: Ambulation Assistive device: Walker-rolling Max distance: (10 ft) Assist level: Touching or steadying assistance (Pt > 75%) Assist level:  Touching or steadying assistance (Pt > 75%) Walk 50 feet with 2 turns activity did not occur: Safety/medical concerns Walk 150 feet activity did not occur: Safety/medical concerns Walk 10 feet on uneven surfaces activity did not occur: Safety/medical concerns  Function - Comprehension Comprehension: Auditory Comprehension assist level: Follows complex conversation/direction with no assist  Function - Expression Expression: Verbal Expression assist level: Expresses complex ideas: With no assist  Function - Social Interaction Social Interaction assist level: Interacts appropriately with others - No medications needed.  Function - Problem Solving Problem solving assist level: Solves complex problems: Recognizes & self-corrects  Function - Memory Memory assist level: Complete Independence: No helper Patient normally able to recall (first 3 days only): Current season, Location of own room, Staff names and faces, That he or she is in a hospital   Medical Problem List and Plan: 1. 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 DVT Prophylaxis/Anticoagulation: Pharmaceutical:Lovenoxadded as > week post op. Check dopplers  3. Pain Management:oxycodone prn effective, slept well last night 4. Mood:LCSW to follow for evaluation and support. Reports that she is  managing to deal with diagnosis 5. Neuropsych: This patientiscapable of making decisions on herown behalf. 6. Skin/Wound Care:routine pressure relief measures 7. Fluids/Electrolytes/Nutrition:Monitor I/O. Improved oral intake 840 mL yesterday, encourage fluids 8.HTN: Monitor BP bid. Continue Norvasc and dyazide.  Blood pressure controlled 12/29/2016 Vitals:   12/28/16 1419 12/29/16 0522  BP: (!) 150/77 137/68  Pulse: (!) 52 64  Resp: 18 18  Temp: 98 F (36.7 C) 98 F (36.7 C)  SpO2: 96% 99%   9. ABLA: on iron.  Repeat hgb 11/9 is stable' 10. Leucocytosis: Likely reactive due to steroids. Monitor for signs of infection.  11. AKI: Due to dehydration . Encourage fluid intake. Repeat BUN/Creat 11/9 stable 12. Hyponatremia: Will monitor for [email or web address] asymptomatic, may be related to diuretic  LOS (Days) 3 A FACE TO FACE EVALUATION WAS PERFORMED  Jalissa Heinzelman E 12/29/2016, 11:03 AM

## 2016-12-29 NOTE — Progress Notes (Signed)
Occupational Therapy Session Note  Patient Details  Name: Katrina [name] MRN: [id number] Date of Birth: 13-Oct-1950  Today's Date: 12/29/2016 OT Individual Time: 1303-1401 OT Individual Time Calculation (min): 58 min    Short Term Goals: Week 1:  OT Short Term Goal 1 (Week 1): Pt will transfer to BSC/ toilet with min A with LRAD OT Short Term Goal 2 (Week 1): Pt will don UB clothing mod I  OT Short Term Goal 3 (Week 1): Pt will dress LB with mod a including shoes OT Short Term Goal 4 (Week 1): Pt will transfer into tub shower with tub bench with min A.   Skilled Therapeutic Interventions/Progress Updates:    Treatment session focused on ADLs/self care training, transfer training, pt education on safety awareness and energy conservation techniques, balance training, and endurance/activity tolerance training. Upon entering pt in recliner chair and agreeable to AM ADLs, able to recall 3/3 back precautions. Pt completed sit<>stand transfer from chair to w/c with mod A and v/c for hand/foot placement. Pt demo's posterior pelvic tilt and frontal lean in trunk and required tactile and verbal cues to correct upright posture while standing for balance. Pt completed shower transfer from w/c with grab bars with mod A. Completed showering task sitting in shower with CGA and use of LH sponge for LB. Pt required A for drying feet. Donned shirt with CGA and LB with use of min A using AE. Pt completed IADL of folding and straightening up her bed with CGA while in w/c and v/c to adhere to back precautions. Noted decreased endurance. Therapist provided instruction on hand/foot placement during transfers.S<>S transfer training completed using walker with fading A for 30s stands x3. Pt reported B LE fatigue. Transferred to recliner chair and repositioned with call bell in reach and needs met.   Therapy Documentation Precautions:  Precautions Precautions: Fall, Back Precaution Booklet Issued: Yes  (comment) Precaution Comments: Able to state 3/3 back precautions; maintains precautions during mobility with min cues Restrictions Weight Bearing Restrictions: No Pain: Pain Assessment Pain Assessment: 0-10 Pain Score: 3  Pain Type: Surgical pain Pain Location: Back Pain Orientation: Mid Pain Descriptors / Indicators: Aching Pain Onset: On-going Patients Stated Pain Goal: 0 Pain Intervention(s): Repositioned Multiple Pain Sites: No ADL: ADL ADL Comments: see functional navigator    See Function Navigator for Current Functional Status.   Therapy/Group: Individual Therapy  Delon Sacramento 12/29/2016, 3:12 PM

## 2016-12-29 NOTE — Progress Notes (Signed)
*  PRELIMINARY RESULTS* Vascular Ultrasound Bilateral lower extremity venous duplex has been completed.  Preliminary findings: No evidence of deep vein thrombosis or baker's cysts bilaterally.   [name] 12/29/2016, 3:42 PM

## 2016-12-29 NOTE — IPOC Note (Addendum)
Overall Plan of Care [facility]) Patient Details Name: Katrina [name] MRN: [id number] DOB: 09/28/1950  Admitting Diagnosis: <principal problem not specified>  Hospital Problems: Active Problems:   Thoracic myelopathy     Functional Problem List: Nursing Pain, Safety, Motor  PT Balance, Sensory, Skin Integrity, Endurance, Motor, Pain, Safety  OT Balance, Safety, Edema, Skin Integrity, Motor, Pain  SLP    TR         Basic ADL's: OT Grooming, Bathing, Dressing, Toileting     Advanced  ADL's: OT       Transfers: PT Bed Mobility, Bed to Chair, Car, Manufacturing systems engineer, Metallurgist: PT Ambulation, Emergency planning/management officer     Additional Impairments: OT None  SLP        TR      Anticipated Outcomes Item Anticipated Outcome  Self Feeding n/a  Swallowing      Basic self-care  supervision  Toileting  supervision    Bathroom Transfers supervision  Bowel/Bladder  patient will continue to be continent of bowel and bladder during admission  Transfers  S  Locomotion  S with LRAD  Communication     Cognition     Pain  patient will be pain free or pain less than 3 during admission  Safety/Judgment  patient will be free from falls and adhere to safety plan   Therapy Plan: PT Intensity: Minimum of 1-2 x/day ,45 to 90 minutes PT Frequency: 5 out of 7 days PT Duration Estimated Length of Stay: 14-16 days OT Intensity: Minimum of 1-2 x/day, 45 to 90 minutes OT Duration/Estimated Length of Stay: ~2 weeks  Ot frequency 5 out of 7 days      Team Interventions: Nursing Interventions Pain Management, Skin Care/Wound Management, Patient/Family Education, Disease Management/Prevention  PT interventions Ambulation/gait training, Discharge planning, Functional mobility training, Psychosocial support, Therapeutic Activities, Wheelchair propulsion/positioning, Therapeutic Exercise, Skin care/wound management, Neuromuscular re-education, Disease management/prevention,  Training and development officer, DME/adaptive equipment instruction, Pain management, Splinting/orthotics, UE/LE Strength taining/ROM, UE/LE Coordination activities, Stair training, Patient/family education, Functional electrical stimulation, Community reintegration  OT Interventions Training and development officer, Discharge planning, Pain management, Self Care/advanced ADL retraining, Therapeutic Activities, UE/LE Coordination activities, Therapeutic Exercise, Skin care/wound managment, Patient/family education, Functional mobility training, Cognitive remediation/compensation, DME/adaptive equipment instruction, Neuromuscular re-education, Psychosocial support, UE/LE Strength taining/ROM, Wheelchair propulsion/positioning  SLP Interventions    TR Interventions    SW/CM Interventions Discharge Planning, Psychosocial Support, Patient/Family Education   Barriers to Discharge MD  Medical stability and Pending chemo/radiation  Nursing      PT Inaccessible home environment, Pending chemo/radiation have recommended pt discuss building ramp with family  OT      SLP      SW       Team Discharge Planning: Destination: PT-Home ,OT- Home , SLP-  Projected Follow-up: PT-Outpatient PT, OT-  Home health OT, SLP-  Projected Equipment Needs: PT- , OT- To be determined, SLP-  Equipment Details: PT-has rollator, RW, w/c (needs to be measured to assess fit), OT-  Patient/family involved in discharge planning: PT- Patient,  OT-Patient, SLP-   MD ELOS: 10d Medical Rehab Prognosis:  Good Assessment:  66 y.o.femalewho was admitted [name] on 10/25 - 12/14/16 with generalized weakness, back pain and BLE numbness/weakness with difficulty walking. She was found to have extensive diffuse lymphadenopathy, LUL and left chest wall metastases, right renal mass and pathologic left rib fractures. She was discharged to home with walker and plans to follow up with Hem/onc but continued to have  progressive RLE weakness with foot  drop and ascending numbness to lower abdomen. 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 She was evaluated by Dr. Saintclair Halsted and taken to OR the same day for decompressive thoracic laminectomy from T5-T8 for resection of tumor. Pathology revealed diffuse large B-cell lymphoma and Dr. Alvy Bimler consulted for input and recommend starting first round of chemo Nov 19th or sooner if doing well. Interventional radiology consulted to place [facility] and XRT to begin 2 weeks post op. Patient with paraplegia and significant deficits in mobility as well as ability to carry out ADL tasks   Now requiring 24/7 Rehab RN,MD, as well as CIR level PT, OT  Treatment team will focus on ADLs and mobility with goals set at sup See Team Conference Notes for weekly updates to the plan of care

## 2016-12-30 ENCOUNTER — Inpatient Hospital Stay (HOSPITAL_COMMUNITY): Payer: Medicare Other | Admitting: Physical Therapy

## 2016-12-30 ENCOUNTER — Inpatient Hospital Stay (HOSPITAL_COMMUNITY): Payer: Medicare Other | Admitting: Occupational Therapy

## 2016-12-30 ENCOUNTER — Encounter (HOSPITAL_COMMUNITY): Payer: Self-pay | Admitting: Interventional Radiology

## 2016-12-30 ENCOUNTER — Inpatient Hospital Stay (HOSPITAL_COMMUNITY): Payer: Medicare Other

## 2016-12-30 HISTORY — PX: IR US GUIDE VASC ACCESS RIGHT: IMG2390

## 2016-12-30 HISTORY — PX: IR FLUORO GUIDE PORT INSERTION RIGHT: IMG5741

## 2016-12-30 LAB — CBC
HEMATOCRIT: 36.7 % (ref 36.0–46.0)
Hemoglobin: 12.1 g/dL (ref 12.0–15.0)
MCH: 27 pg (ref 26.0–34.0)
MCHC: 33 g/dL (ref 30.0–36.0)
MCV: 81.9 fL (ref 78.0–100.0)
PLATELETS: 442 10*3/uL — AB (ref 150–400)
RBC: 4.48 MIL/uL (ref 3.87–5.11)
RDW: 15.1 % (ref 11.5–15.5)
WBC: 17.1 10*3/uL — AB (ref 4.0–10.5)

## 2016-12-30 MED ORDER — FENTANYL CITRATE (PF) 100 MCG/2ML IJ SOLN
INTRAMUSCULAR | Status: AC
  Filled 2016-12-30: qty 2

## 2016-12-30 MED ORDER — HEPARIN SOD (PORK) LOCK FLUSH 100 UNIT/ML IV SOLN
INTRAVENOUS | Status: AC
  Filled 2016-12-30: qty 5

## 2016-12-30 MED ORDER — LIDOCAINE HCL 1 % IJ SOLN
INTRAMUSCULAR | Status: AC
  Filled 2016-12-30: qty 20

## 2016-12-30 MED ORDER — FENTANYL CITRATE (PF) 100 MCG/2ML IJ SOLN
INTRAMUSCULAR | Status: AC | PRN
  Administered 2016-12-30 (×4): 25 ug via INTRAVENOUS

## 2016-12-30 MED ORDER — HEPARIN SOD (PORK) LOCK FLUSH 100 UNIT/ML IV SOLN
INTRAVENOUS | Status: AC | PRN
  Administered 2016-12-30: 500 [IU] via INTRAVENOUS

## 2016-12-30 MED ORDER — LIDOCAINE HCL (PF) 1 % IJ SOLN
INTRAMUSCULAR | Status: AC | PRN
  Administered 2016-12-30: 20 mL

## 2016-12-30 MED ORDER — SODIUM CHLORIDE 0.9 % IV SOLN
INTRAVENOUS | Status: AC | PRN
  Administered 2016-12-30: 10 mL/h via INTRAVENOUS

## 2016-12-30 MED ORDER — MIDAZOLAM HCL 2 MG/2ML IJ SOLN
INTRAMUSCULAR | Status: AC | PRN
  Administered 2016-12-30: 1 mg via INTRAVENOUS
  Administered 2016-12-30 (×2): 0.5 mg via INTRAVENOUS

## 2016-12-30 MED ORDER — MIDAZOLAM HCL 2 MG/2ML IJ SOLN
INTRAMUSCULAR | Status: AC
  Filled 2016-12-30: qty 2

## 2016-12-30 MED ORDER — CEFAZOLIN SODIUM-DEXTROSE 2-4 GM/100ML-% IV SOLN
INTRAVENOUS | Status: AC
Start: 2016-12-30 — End: 2016-12-30
  Filled 2016-12-30: qty 100

## 2016-12-30 NOTE — Progress Notes (Signed)
Occupational Therapy Session Note  Patient Details  Name: Katrina [name] MRN: [id number] Date of Birth: Dec 26, 1950  Today's Date: 12/30/2016 OT Individual Time: 3159-4585 OT Individual Time Calculation (min): 26 min  and Today's Date: 12/30/2016 OT Missed Time: 34 Minutes Missed Time Reason: Patient fatigue   Short Term Goals: Week 1:  OT Short Term Goal 1 (Week 1): Pt will transfer to BSC/ toilet with min A with LRAD OT Short Term Goal 2 (Week 1): Pt will don UB clothing mod I  OT Short Term Goal 3 (Week 1): Pt will dress LB with mod a including shoes OT Short Term Goal 4 (Week 1): Pt will transfer into tub shower with tub bench with min A.   Skilled Therapeutic Interventions/Progress Updates:    Treatment session with focus on functional transfers.  Pt received supine in bed reporting no c/o pain, just fatigue from procedure and hungry.  Completed bed mobility with use of log roll technique with supervision, then stand pivot transfer with RW with mod assist due to LOB resulting from overstepping with ataxic BLE.  Completed stand pivot transfers w/c <> toilet in bathroom with use of grab bar with min assist.  Pt reporting increased fatigue and requesting to transfer to recliner and terminate session due to weakness and fatigue.  Completed stand pivot transfer with RW with improved control, min cues for attention to BLE foot placement prior to sit > stand.  Pt left upright in recliner with all needs in reach. Pt missed 34 mins treatment session due to fatigue and weakness post procedure.  Therapy Documentation Precautions:  Precautions Precautions: Fall, Back Precaution Booklet Issued: Yes (comment) Precaution Comments: Able to state 3/3 back precautions; maintains precautions during mobility with min cues Restrictions Weight Bearing Restrictions: No General: General OT Amount of Missed Time: 44 Minutes PT Missed Treatment Reason: Unavailable (Comment)(off unit for procedure) Vital  Signs: Therapy Vitals Pulse Rate: 67 Resp: 13 BP: (!) 149/76 Patient Position (if appropriate): Lying  Pain: Pain Assessment Pain Assessment: No/denies pain Faces Pain Scale: No hurt  See Function Navigator for Current Functional Status.   Therapy/Group: Individual Therapy  Simonne Come 12/30/2016, 2:58 PM

## 2016-12-30 NOTE — Progress Notes (Signed)
Physical Therapy Session Note  Patient Details  Name: Katrina [name] MRN: [id number] Date of Birth: 01-04-1951  Today's Date: 12/30/2016 PT Individual Time: 0800-0900 PT Individual Time Calculation (min): 60 min   Short Term Goals: Week 1:  PT Short Term Goal 1 (Week 1): Pt will demonstrate bed mobility consistent S PT Short Term Goal 2 (Week 1): Pt will demonstrate transfer w/c <>bed with consistent minA PT Short Term Goal 3 (Week 1): Pt will ambulate x50' minA PT Short Term Goal 4 (Week 1): Pt will demonstrate dynamic standing balance x5 min with minA  Skilled Therapeutic Interventions/Progress Updates: Tx1: Pt received supine in bed, c/o 2/10 pain in back and agreeable to treatment. Pt confused regarding cath placement; RN present to clarify that pt will likely go down for procedure around 11am this morning and may participate in therapy before that. Supine>sit with S, logroll and increased time. Stand pivot transfer with RW and minA to w/c. Stand pivot transfer w/c <>BSC over toilet with grab bars and minA, min cues for alignment to sit in w/c. Performs hygiene without assist. Seated in w/c at sink pt performs self care tasks modI. W/c propulsion BUE x150' for strengthening and endurance with S Stand pivot transfer w/c <>mat table with RW and min guard. Standing alternating toe taps to 3" step with minA; increased time and slow speed d/t ataxia, use of mirror feedback to increase awareness of LE position. In parallel bars performed alternating LE D2 flexion/extension stepping to colored target in diagonal extended position, to 10" step in diagonal flexed position; 2x 8-10 reps each side with seated rest break between d/t fatigue. Alternating step-ups to 3" step with B handrails x2 trials; x2 steps on first trial, x3 steps on second trial. Ace wrap donned to L knee after first trial d/t poor knee control and impaired proprioception; pt reports improved control of knee and sensation of position.  Returned to room w/c propulsion as above. Stand pivot transfer to recliner with RW and minA. Remained seated in w/c with RN present, all needs in reach.   Tx 2: Pt missed 30 min PT d/t off unit for procedure; will follow per POC as schedule allows.     Therapy Documentation Precautions:  Precautions Precautions: Fall, Back Precaution Booklet Issued: Yes (comment) Precaution Comments: Able to state 3/3 back precautions; maintains precautions during mobility with min cues Restrictions Weight Bearing Restrictions: No General: PT Amount of Missed Time (min): 30 Minutes PT Missed Treatment Reason: Unavailable (Comment)(off unit for procedure)   See Function Navigator for Current Functional Status.   Therapy/Group: Individual Therapy  Luberta Mutter 12/30/2016, 8:53 AM

## 2016-12-30 NOTE — Procedures (Signed)
Interventional Radiology Procedure Note  Procedure: Placement of a right IJ approach single lumen PowerPort.  Tip is positioned at the superior cavoatrial junction and catheter is ready for immediate use.  Complications: No immediate Recommendations:  - Ok to shower tomorrow - Do not submerge for 7 days - Routine line care   Saraphina Lauderbaugh T. Iyauna Sing, M.D Pager:  [phone]   

## 2016-12-30 NOTE — Progress Notes (Signed)
Occupational Therapy Note  Patient Details  Name: Katrina [name] MRN: [id number] Date of Birth: Jul 12, 1950  Today's Date: 12/30/2016 OT Missed Time: 69 Minutes Missed Time Reason: Other (comment)(off unit for procedure)  Pt missed 60 mins scheduled OT treatment session due to off unit for procedure.  Pt just transferred back to bed to be transported off unit for procedure.  Will return at later time to attempt therapy.  Simonne Come 12/30/2016, 11:08 AM

## 2016-12-30 NOTE — Progress Notes (Signed)
Social Work  Social Work Assessment and Plan  Patient Details  Name: Katrina [name] MRN: [id number] Date of Birth: 26-Sep-1950  Today's Date: 12/30/2016  Problem List:  Patient Active Problem List   Diagnosis Date Noted  . 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 Diffuse large B cell lymphoma ([place]) 12/24/2016  . Paraparesis ([place]) 12/19/2016  . Chest wall mass 12/19/2016  . 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 S/P lumbar laminectomy 12/18/2016  . Numbness and tingling 12/13/2016  . Numbness and tingling of both lower extremities 12/12/2016  . Hypertension 12/12/2016  . Anemia 12/12/2016  . Abdominal lymphadenopathies 12/12/2016  . Renal mass, right 12/12/2016  . Constipation 12/12/2016  . FH: colon cancer 10/22/2013  . RUQ discomfort 10/22/2013   Past Medical History:  Past Medical History:  Diagnosis Date  . Anemia   . Arthritis   . 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 Hypertension   . Lymphadenopathy 11/2016   "extensive"  . Paresthesia of lower extremity 11/2016   bilateral  . Pulmonary nodules 11/2016  . Right renal mass 11/2016   Past Surgical History:  Past Surgical History:  Procedure Laterality Date  . ABDOMINAL HYSTERECTOMY    . BREAST LUMPECTOMY     left  . IR FLUORO GUIDE PORT INSERTION RIGHT  12/30/2016  . IR US GUIDE VASC ACCESS RIGHT  12/30/2016  . SHOULDER SURGERY     right   Social History:  reports that  has never smoked. she has never used smokeless tobacco. She reports that she does not drink alcohol or use drugs.  Family / Support Systems Marital Status: Widow/Widower How Long?: 2004 Patient Roles: Parent(son, grandtr and sister) Children: son, [facility]) @ (C) [phone] (and dtr-in-law, Theresa Mulligan @ [phone] Other Supports: sister, Danton [facility]) @ d(C) [phone];  Katrina Stack, Randel Books (lives in the home with her parents, pt's son and dtr-in-law) Anticipated Caregiver: son, grand daughter, sister Ability/Limitations of Caregiver: Son  works.  [place] daughter is 65 yo and may stay.  Sister may be able to stay as well.  Pt also notes that she has several friends who can assist if needed as well. Caregiver Availability: (pt feels that she can have very close to 24/7 assistance available via several difference people) Family Dynamics: Pt describes all family as very supportive and having good relationship with her son and his family.  Notes that she felt very supported with moving in with son when her mobility began failing.  Social History Preferred language: English Religion: Baptist Cultural Background: NA Education: HS Read: Yes Write: Yes Employment Status: Retired Freight forwarder Issues: None Guardian/Conservator: None - per MD, pt is capable of making decisions on her own behalf.   Abuse/Neglect Abuse/Neglect Assessment Can Be Completed: Yes Physical Abuse: Denies Verbal Abuse: Denies Sexual Abuse: Denies Exploitation of patient/patient's resources: Denies Self-Neglect: Denies  Emotional Status Pt's affect, behavior adn adjustment status: Pt very pleasant and talks easily with me about her new CA diagnosis.  She does become a little tearful when asked how she is coping with this news.  Reports, "I think I'm doing pretty good considering.  I'm scared of what's to come but I think that is probably normal."  She denies any significant emotional distress.  Have referred for neuropsychology consult for additional support resources. Recent Psychosocial Issues: None Pyschiatric History: None Substance Abuse History: None  Patient / Family Perceptions, Expectations & Goals Pt/Family understanding of illness & functional limitations: Pt and family with good,  basic understanding of her metastatic ca, surgery done for spinal tumor removal and current functional limitations/ need for CIR Premorbid pt/family roles/activities: Pt was completely independent until she began with declining mobility a couple of weeks  ago.  Was active/ independent at home and in the community. Anticipated changes in roles/activities/participation: Goals set for supervision/ min assist.  Son, sister and other family will need to assume caregiver support roles. Pt/family expectations/goals: "I just hope I can get to where I just need a warm body there to help me a little."  [employer]: None Premorbid Home Care/DME Agencies: None Transportation available at discharge: yes Resource referrals recommended: Neuropsychology  Discharge Planning Living Arrangements: Alone Support Systems: Children, Other relatives, Friends/neighbors Type of Residence: Private residence Insurance Resources: [employer]) Financial Resources: Radio broadcast assistant Screen Referred: No Living Expenses: Own Money Management: Patient Does the patient have any problems obtaining your medications?: No Home Management: pt was independent Patient/Family Preliminary Plans: Pt to d/c to son's home where family will arrange for 24/7 support. Social Work Anticipated Follow Up Needs: HH/OP, Support Group Expected length of stay: 14 days  Clinical Impression Very pleasant woman here following surgery for removal of spinal tumor after learning of metastatic cancer process.  She talks pretty openly about her diagnosis and "shock" reaction.  She admits she has "moments of being really scared."  Has good support of family and friends and plans to move in with son and dtr-in-law as long as needed with hopes to eventually be able to return to her own home.  She is aware that oncology plans to begin chemo tx upon completion of her rehab but exact timing still TBD.  SW to follow for support and d/c planning needs.  Janmarie Smoot 12/30/2016, 2:30 PM

## 2016-12-30 NOTE — Progress Notes (Signed)
Subjective/Complaints: Patient seen in physical therapy working on ambulation as well as steps.  Still feels unsteady with poor coordination in the feet. No significant pain complaints at the current time.  Bowel and bladder are working well. ROS: Denies chest pain, shortness of breath, nausea, vomiting, diarrhea, constipation Objective: Vital Signs: Blood pressure (!) 142/77, pulse 67, temperature 98.3 F (36.8 C), temperature source Oral, resp. rate 18, SpO2 99 %. No results found. No results found for this or any previous visit (from the past 72 hour(s)).   HEENT: normal Cardio: RRR and No murmur Resp: CTA B/L and Unlabored GI: BS positive and Nontender, nondistended Extremity:  Edema 1+ pedal and ankle edema Skin:   Wound C/D/I Neuro: Alert/Oriented, Abnormal Sensory Paresthesias below T8 however has intact light touch sensation, Abnormal Motor 5/5 bilateral deltoid, bicep, tricep, grip, 3- in the left hip flexor knee extensor ankle dorsiflexor 2 in the right hip flexor knee extensor ankle dorsiflexor and Reflexes: 0 at the ankles, 3 plus at the knees Absent proprioception to great toe movement bilaterally. Musc/Skel:  Other No pain with lower extremity range of motion no pain with upper extremity range of motion no significant back pain when turning in bed General no acute distress   Assessment/Plan: 1. 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 Physiatrist is providing close team supervision and 24 hour management of active medical problems listed below. Physiatrist and rehab team continue to assess barriers to discharge/monitor patient progress toward functional and medical goals. FIM: Function - Bathing Position: Shower Body parts bathed by patient: Right arm, Left arm, Chest, Right upper leg, Left upper leg, Abdomen, Front perineal  area, Buttocks Body parts bathed by helper: Right lower leg, Left lower leg Bathing not applicable: Back Assist Level: Touching or steadying assistance(Pt > 75%), Assistive device Assistive Device Comment: LH sponge  Function- Upper Body Dressing/Undressing What is the patient wearing?: Pull over shirt/dress Pull over shirt/dress - Perfomed by patient: Thread/unthread right sleeve, Thread/unthread left sleeve, Put head through opening, Pull shirt over trunk Pull over shirt/dress - Perfomed by helper: Pull shirt over trunk Assist Level: Supervision or verbal cues Function - Lower Body Dressing/Undressing What is the patient wearing?: Non-skid slipper socks, Pants Position: Wheelchair/chair at sink Pants- Performed by patient: Thread/unthread right pants leg, Thread/unthread left pants leg, Pull pants up/down Pants- Performed by helper: Thread/unthread left pants leg Non-skid slipper socks- Performed by patient: Don/doff right sock, Don/doff left sock(using sock aid ) Non-skid slipper socks- Performed by helper: Don/doff right sock, Don/doff left sock Assist for footwear: Supervision/touching assist Assist for lower body dressing: Touching or steadying assistance (Pt > 75%), Assistive device Assistive Device Comment: reacher + sock aide  Function - Toileting Toileting steps completed by patient: Performs perineal hygiene(clothing N/A in gown, no brief) Toileting steps completed by helper: Adjust clothing prior to toileting, Adjust clothing after toileting Toileting Assistive Devices: Grab bar or rail Assist level: More than reasonable time  Function Midwife transfer assistive device: Bedside commode, Grab bar Assist level to toilet: Touching or steadying assistance (Pt > 75%) Assist level from toilet: Touching or steadying assistance (Pt > 75%) Assist level to bedside commode (at bedside): Touching or steadying assistance (Pt > 75%) Assist level from bedside commode  (at bedside): Touching or steadying assistance (Pt > 75%)  Function - Chair/bed transfer Chair/bed transfer method: Stand pivot Chair/bed transfer assist level:  Touching or steadying assistance (Pt > 75%) Chair/bed transfer assistive device: Walker Chair/bed transfer details: Verbal cues for precautions/safety, Verbal cues for safe use of DME/AE, Verbal cues for technique, Verbal cues for sequencing  Function - Locomotion: Wheelchair Type: Manual Max wheelchair distance: 100 Assist Level: Supervision or verbal cues Assist Level: Supervision or verbal cues Wheel 150 feet activity did not occur: Safety/medical concerns(fatigue) Function - Locomotion: Ambulation Assistive device: Walker-rolling Max distance: (10 ft) Assist level: Touching or steadying assistance (Pt > 75%) Assist level: Touching or steadying assistance (Pt > 75%) Walk 50 feet with 2 turns activity did not occur: Safety/medical concerns Walk 150 feet activity did not occur: Safety/medical concerns Walk 10 feet on uneven surfaces activity did not occur: Safety/medical concerns  Function - Comprehension Comprehension: Auditory Comprehension assist level: Follows complex conversation/direction with no assist  Function - Expression Expression: Verbal Expression assist level: Expresses complex ideas: With no assist  Function - Social Interaction Social Interaction assist level: Interacts appropriately with others - No medications needed.  Function - Problem Solving Problem solving assist level: Solves complex problems: Recognizes & self-corrects  Function - Memory Memory assist level: Complete Independence: No helper Patient normally able to recall (first 3 days only): Current season, Location of own room, Staff names and faces, That he or she is in a hospital   Medical Problem List and Plan: 1. 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 DVT Prophylaxis/Anticoagulation: Pharmaceutical:Lovenoxadded as > week post op. Check dopplers  3. Pain Management:oxycodone prn effective, slept well last night, no change to regimen 4. Mood:LCSW to follow for evaluation and support. Reports that she is managing to deal with diagnosis 5. Neuropsych: This patientiscapable of making decisions on herown behalf. 6. Skin/Wound Care:routine pressure relief measures 7. Fluids/Electrolytes/Nutrition:Monitor I/O. Improved oral intake 840 mL yesterday, encourage fluids 8.HTN: Monitor BP bid. Continue Norvasc and dyazide.  Blood pressure controlled 12/30/2016 Vitals:   12/29/16 0522 12/30/16 0608  BP: 137/68 (!) 142/77  Pulse: 64 67  Resp: 18 18  Temp: 98 F (36.7 C) 98.3 F (36.8 C)  SpO2: 99% 99%   9. ABLA: on iron. Repeat hgb 11/9 is stable' 10. Leucocytosis: Likely reactive due to steroids. Monitor for signs of infection.  11. AKI: Due to dehydration . Encourage fluid intake. Repeat BUN/Creat 11/9 stable 12. Hyponatremia: Will monitor for [email or web address] asymptomatic, may be related to diuretic  LOS (Days) 4 A FACE TO FACE EVALUATION WAS PERFORMED  KIRSTEINS,ANDREW E 12/30/2016, 8:35 AM

## 2016-12-31 ENCOUNTER — Inpatient Hospital Stay (HOSPITAL_COMMUNITY): Payer: Medicare Other | Admitting: Physical Therapy

## 2016-12-31 ENCOUNTER — Inpatient Hospital Stay (HOSPITAL_COMMUNITY): Payer: Medicare Other | Admitting: Occupational Therapy

## 2016-12-31 ENCOUNTER — Inpatient Hospital Stay (HOSPITAL_COMMUNITY): Payer: Medicare Other

## 2016-12-31 ENCOUNTER — Ambulatory Visit (HOSPITAL_COMMUNITY): Payer: Self-pay

## 2016-12-31 ENCOUNTER — Encounter (HOSPITAL_COMMUNITY): Payer: Medicare Other | Admitting: Psychology

## 2016-12-31 DIAGNOSIS — C8330 Diffuse large B-cell lymphoma, unspecified site: Secondary | ICD-10-CM

## 2016-12-31 DIAGNOSIS — G9520 Unspecified cord compression: Secondary | ICD-10-CM

## 2016-12-31 NOTE — Progress Notes (Signed)
Katrina [name]   DOB:1950-03-20   ZO#:[id number]    Subjective: She is doing very well.  She really enjoyed working with physical therapist and felt that she is getting stronger.  She has minimum pain from recent surgery.  Paresthesia in her lower extremities are gradually improving.  She has completed port placement without complications  Objective:  Vitals:   12/31/16 0921 12/31/16 1400  BP: (!) 159/72 (!) 145/82  Pulse:  96  Resp:  18  Temp:  98 F (36.7 C)  SpO2:  99%     Intake/Output Summary (Last 24 hours) at 12/31/2016 1714 Last data filed at 12/31/2016 1500 Gross per 24 hour  Intake 684 ml  Output -  Net 684 ml    GENERAL:alert, no distress and comfortable SKIN: skin color, texture, turgor are normal, no rashes or significant lesions EYES: normal, Conjunctiva are pink and non-injected, sclera clear Musculoskeletal:no cyanosis of digits and no clubbing  NEURO: alert & oriented x 3 with fluent speech, no focal motor/sensory deficits   Labs:  Lab Results  Component Value Date   WBC 17.1 (H) 12/30/2016   HGB 12.1 12/30/2016   HCT 36.7 12/30/2016   MCV 81.9 12/30/2016   PLT 442 (H) 12/30/2016   NEUTROABS 11.4 (H) 12/27/2016    Lab Results  Component Value Date   NA 131 (L) 12/27/2016   K 3.6 12/27/2016   CL 96 (L) 12/27/2016   CO2 26 12/27/2016    Studies:  Ir US Guide Vasc Access Right  Result Date: 12/30/2016 CLINICAL DATA:  Diffuse large B-cell lymphoma and need for porta cath to begin chemotherapy. EXAM: IMPLANTED PORT A CATH PLACEMENT WITH ULTRASOUND AND FLUOROSCOPIC GUIDANCE ANESTHESIA/SEDATION: 2.0 mg IV Versed; 100 mcg IV Fentanyl Total Moderate Sedation Time:  33 minutes The patient's level of consciousness and physiologic status were continuously monitored during the procedure by Radiology nursing. Additional Medications: 2 g IV Ancef. FLUOROSCOPY TIME:  24 seconds.  2.0 mGy. PROCEDURE: The procedure, risks, benefits, and alternatives were explained to  the patient. Questions regarding the procedure were encouraged and answered. The patient understands and consents to the procedure. A time-out was performed prior to initiating the procedure. Ultrasound was utilized to confirm patency of the right internal jugular vein. The right neck and chest were prepped with chlorhexidine in a sterile fashion, and a sterile drape was applied covering the operative field. Maximum barrier sterile technique with sterile gowns and gloves were used for the procedure. Local anesthesia was provided with 1% lidocaine. After creating a small venotomy incision, a 21 gauge needle was advanced into the right internal jugular vein under direct, real-time ultrasound guidance. Ultrasound image documentation was performed. After securing guidewire access, an 8 Fr dilator was placed. A J-wire was kinked to measure appropriate catheter length. A subcutaneous port pocket was then created along the upper chest wall utilizing sharp and blunt dissection. Portable cautery was utilized. The pocket was irrigated with sterile saline. A single lumen power injectable port was chosen for placement. The 8 Fr catheter was tunneled from the port pocket site to the venotomy incision. The port was placed in the pocket. External catheter was trimmed to appropriate length based on guidewire measurement. At the venotomy, an 8 Fr peel-away sheath was placed over a guidewire. The catheter was then placed through the sheath and the sheath removed. Final catheter positioning was confirmed and documented with a fluoroscopic spot image. The port was accessed with a needle and aspirated and flushed with heparinized  saline. The access needle was removed. The venotomy and port pocket incisions were closed with subcutaneous 3-0 Monocryl and subcuticular 4-0 Vicryl. Dermabond was applied to both incisions. COMPLICATIONS: COMPLICATIONS None FINDINGS: After catheter placement, the tip lies at the cavo-atrial junction. The  catheter aspirates normally and is ready for immediate use. IMPRESSION: Placement of single lumen port a cath via right internal jugular vein. The catheter tip lies at the cavo-atrial junction. A power injectable port a cath was placed and is ready for immediate use. Electronically Signed   By: Aletta Edouard M.D.   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 EXAM: IMPLANTED PORT A CATH PLACEMENT WITH ULTRASOUND AND FLUOROSCOPIC GUIDANCE ANESTHESIA/SEDATION: 2.0 mg IV Versed; 100 mcg IV Fentanyl Total Moderate Sedation Time:  33 minutes The patient's level of consciousness and physiologic status were continuously monitored during the procedure by Radiology nursing. Additional Medications: 2 g IV Ancef. FLUOROSCOPY TIME:  24 seconds.  2.0 mGy. PROCEDURE: The procedure, risks, benefits, and alternatives were explained to the patient. Questions regarding the procedure were encouraged and answered. The patient understands and consents to the procedure. A time-out was performed prior to initiating the procedure. Ultrasound was utilized to confirm patency of the right internal jugular vein. The right neck and chest were prepped with chlorhexidine in a sterile fashion, and a sterile drape was applied covering the operative field. Maximum barrier sterile technique with sterile gowns and gloves were used for the procedure. Local anesthesia was provided with 1% lidocaine. After creating a small venotomy incision, a 21 gauge needle was advanced into the right internal jugular vein under direct, real-time ultrasound guidance. Ultrasound image documentation was performed. After securing guidewire access, an 8 Fr dilator was placed. A J-wire was kinked to measure appropriate catheter length. A subcutaneous port pocket was then created along the upper chest wall utilizing sharp and blunt  dissection. Portable cautery was utilized. The pocket was irrigated with sterile saline. A single lumen power injectable port was chosen for placement. The 8 Fr catheter was tunneled from the port pocket site to the venotomy incision. The port was placed in the pocket. External catheter was trimmed to appropriate length based on guidewire measurement. At the venotomy, an 8 Fr peel-away sheath was placed over a guidewire. The catheter was then placed through the sheath and the sheath removed. Final catheter positioning was confirmed and documented with a fluoroscopic spot image. The port was accessed with a needle and aspirated and flushed with heparinized saline. The access needle was removed. The venotomy and port pocket incisions were closed with subcutaneous 3-0 Monocryl and subcuticular 4-0 Vicryl. Dermabond was applied to both incisions. COMPLICATIONS: COMPLICATIONS None FINDINGS: After catheter placement, the tip lies at the cavo-atrial junction. The catheter aspirates normally and is ready for immediate use. IMPRESSION: Placement of single lumen port a cath via right internal jugular vein. The catheter tip lies at the cavo-atrial junction. A power injectable port a cath was placed and is ready for immediate use. Electronically Signed   By: Aletta Edouard M.D.   On: 12/30/2016 13:10    Assessment & Plan:   Stage IV diffuse large B-cell lymphoma I received an update from rehab center stating that the patient might be discharged on January 08, 2017 The patient is doing very well and I think she is benefiting from rehab For that reason, I  recommend delaying her chemotherapy until January 13, 2017 I will come back again next week to give her further detail plan of care The tentative plan would be for her to be discharge after she completed rehab.  And I will directly admit her to the hospital on January 13, 2017  Cord compression status post surgery She is really improving daily. She will  continue rehab  Discharge planning As above.  I will return next week for further follow-up   Heath Lark, MD 12/31/2016  5:14 PM

## 2016-12-31 NOTE — Evaluation (Signed)
Recreational Therapy Assessment and Plan  Patient Details  Name: Katrina [name] MRN: [id number] Date of Birth: [birth year] Today's Date: 12/31/2016  Rehab Potential: Good ELOS: 10 days   Assessment  Problem List:      Patient Active Problem List   Diagnosis Date Noted  . 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 Diffuse large B cell lymphoma ([place]) 12/24/2016  . Paraparesis ([place]) 12/19/2016  . Chest wall mass 12/19/2016  . 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 S/P lumbar laminectomy 12/18/2016  . Numbness and tingling 12/13/2016  . Numbness and tingling of both lower extremities 12/12/2016  . Hypertension 12/12/2016  . Anemia 12/12/2016  . Abdominal lymphadenopathies 12/12/2016  . Renal mass, right 12/12/2016  . Constipation 12/12/2016  . FH: colon cancer 10/22/2013  . RUQ discomfort 10/22/2013    Past Medical History:      Past Medical History:  Diagnosis Date  . Anemia   . Arthritis   . 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 Hypertension   . Lymphadenopathy 11/2016   "extensive"  . Paresthesia of lower extremity 11/2016   bilateral  . Pulmonary nodules 11/2016  . Right renal mass 11/2016   Past Surgical History:       Past Surgical History:  Procedure Laterality Date  . ABDOMINAL HYSTERECTOMY    . BREAST LUMPECTOMY     left  . SHOULDER SURGERY     right    Assessment & Plan Clinical Impression: Katrina [name] a 66 y.o.femalewho was admitted to [facility] on 10/25 - 12/14/16 with generalized weakness, back pain and BLE numbness/weakness with difficulty walking. She was found to have extensive diffuse lymphadenopathy, LUL and left chest wall metastases, right renal mass and pathologic left rib fractures. She was discharged to home with walker and plans to follow up with Hem/onc but continued to have progressive RLE weakness with foot drop and ascending numbness to lower abdomen. She was admitted on 12/18/16 for work up and MRI thoracic spine revealed extensive epidural  tumor eccentric to right and surrounding the cord from T6/7 to T7/8 with cord compression and replacing T7 vertebral body. She was evaluated by Dr. Saintclair Halsted and taken to OR the same day for decompressive thoracic laminectomy from T5-T8 for resection of tumor. Pathology revealed diffuse large B-cell lymphoma and Dr. Alvy Bimler consulted for input and recommend starting first round of chemo Nov 19th or sooner if doing well. Interventional radiology consulted to [facility] and XRT to begin 2 weeks post op. Patient with paraplegia and significant deficits in mobility as well as ability to carry out ADL tasks. CIRwasrecommended by rehab team. Patient transferred to CIR on 12/26/2016.   Pt presents with decreased activity tolerance, decreased functional mobility, decreased balance, decreased coordination difficulty maintaining precautions  Limiting pt's independence with leisure/community pursuits.    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) Leisure Participation Style: With Family/Friends Psychosocial / Spiritual Patient agreeable to Pet Therapy: Yes Does patient have pets?: No Social interaction - Mood/Behavior: Cooperative Academic librarian Appropriate for Education?: Yes Recreational Therapy Orientation Orientation -Reviewed with patient: Available activity resources Strengths/Weaknesses Patient Strengths/Abilities: Willingness to participate;Active premorbidly  Plan Rec Therapy Plan Is patient appropriate for Therapeutic Recreation?: Yes Rehab Potential: Good Treatment times per week: Min 1 TR session/group >25 minutes during LOS Estimated Length of Stay: 10 days TR Treatment/Interventions: Adaptive equipment instruction;Group participation (Comment);Therapeutic exercise;Wheelchair propulsion/positioning;1:1 session;Community  reintegration;Recreation/leisure participation;UE/LE Multimedia programmer education;Functional mobility  training;Therapeutic activities  Recommendations for other services: None   Discharge Criteria: Patient will be discharged from TR if patient refuses treatment 3 consecutive times without medical reason.  If treatment goals not met, if there is a change in medical status, if patient makes no progress towards goals or if patient is discharged from hospital.  The above assessment, treatment plan, treatment alternatives and goals were discussed and mutually agreed upon: by patient  [place] 12/31/2016, 1:42 PM

## 2016-12-31 NOTE — Progress Notes (Signed)
Physical Therapy Session Note  Patient Details  Name: Katrina [name] MRN: [id number] Date of Birth: 1950-10-22  Today's Date: 12/31/2016 PT Individual Time: 1055-1130 and 1300-1350 PT Individual Time Calculation (min): 35 min   Short Term Goals: Week 1:  PT Short Term Goal 1 (Week 1): Pt will demonstrate bed mobility consistent S PT Short Term Goal 2 (Week 1): Pt will demonstrate transfer w/c <>bed with consistent minA PT Short Term Goal 3 (Week 1): Pt will ambulate x50' minA PT Short Term Goal 4 (Week 1): Pt will demonstrate dynamic standing balance x5 min with minA  Skilled Therapeutic Interventions/Progress Updates:  Session1: Patient in w/c and propelled to [place] S and practices short distance ambulation with RW on carpet and turning to sit in chair x 4.  Transfers with min A and cues increased time and gait min A one episode of LOB and min/mod A lowering to chair.  Patient stand pivot to nu step with RW min and performed 2 x 3 min on Nu Step at level 4 with LE only.  Propelled back to room with [place]: Patient in w/c transferred with RW stand pivot to bed miin A and performed sit<>supine with min A for R LE.  Cues for technique and educated on options for sleeping positions with pillow under knees in supine and between knees in sidelying.  Side to sit no rail and HOB flat S.  Patient performed stand pivot transfer to w/c RW and min A.  Standing step up to 3" step with rails and mod A x 3 reps then 2 for second set.  Standing heel raises x 3, standing step taps to 3" step alternating for balance and LE strength with UE support.  Patient propelled back to room with S and transfer to recliner for comfort with all needs in reach.   Therapy Documentation Precautions:  Precautions Precautions: Fall Precaution Booklet Issued: Yes (comment) Precaution Comments: Able to state 3/3 back precautions; maintains precautions during mobility with min cues Restrictions Weight Bearing  Restrictions: No Pain: Pain Assessment Pain Assessment: 0-10 Pain Score: 4  Faces Pain Scale: Hurts a little bit Pain Type: Acute pain Pain Location: Shoulder Pain Orientation: Right Pain Descriptors / Indicators: Sore Pain Frequency: Intermittent Pain Onset: With Activity Pain Intervention(s): Repositioned    See Function Navigator for Current Functional Status.   Therapy/Group: Individual Therapy  [name] 12/31/2016, 12:57 PM

## 2016-12-31 NOTE — Progress Notes (Signed)
Occupational Therapy Session Note  Patient Details  Name: Katrina [name] MRN: [id number] Date of Birth: 14-May-1950  Today's Date: 12/31/2016 OT Individual Time: 0930-1030 OT Individual Time Calculation (min): 60 min    Short Term Goals: Week 1:  OT Short Term Goal 1 (Week 1): Pt will transfer to BSC/ toilet with min A with LRAD OT Short Term Goal 2 (Week 1): Pt will don UB clothing mod I  OT Short Term Goal 3 (Week 1): Pt will dress LB with mod a including shoes OT Short Term Goal 4 (Week 1): Pt will transfer into tub shower with tub bench with min A.   Skilled Therapeutic Interventions/Progress Updates:      Pt seen for BADL retraining of toileting, bathing, and dressing with a focus on standing balance and transfers.   Pt did well using her vision to compensate for limited sensation/ proprioception in feet with foot placement to prep for standing and with stand step transfers. Pt completed stand pivots w/c >< toilet and w/c >< tub bench using bar and sit to stand at sink with min A.  She follows back precautions well and uses LH sponge, reacher, sock aid to bathe/ dress LB. She needed a few short rest breaks but otherwise tolerated session well. Pt resting in w/c with all needs met.  Therapy Documentation Precautions:  Precautions Precautions: Fall, Back Precaution Booklet Issued: Yes (comment) Precaution Comments: Able to state 3/3 back precautions; maintains precautions during mobility with min cues Restrictions Weight Bearing Restrictions: No    Vital Signs: Therapy Vitals BP: (!) 159/72 Pain: Pain Assessment Pain Assessment: 0-10 Pain Score: 5  Pain Type: Acute pain Pain Location: Chest(where catheter placed yesterday) Pain Orientation: Right Pain Descriptors / Indicators: Aching Pain Frequency: Intermittent Pain Onset: With Activity Pain Intervention(s): Medication (See eMAR) ADL: ADL ADL Comments: see functional navigator  See Function Navigator for Current  Functional Status.   Therapy/Group: Individual Therapy  [place] 12/31/2016, 10:01 AM

## 2016-12-31 NOTE — Progress Notes (Signed)
Subjective/Complaints: Appreciate IR note ROS: Denies chest pain, shortness of breath, nausea, vomiting, diarrhea, constipation Objective: Vital Signs: Blood pressure (!) 155/74, pulse 79, temperature 98 F (36.7 C), temperature source Oral, resp. rate 15, SpO2 96 %. 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 EXAM: IMPLANTED PORT A CATH PLACEMENT WITH ULTRASOUND AND FLUOROSCOPIC GUIDANCE ANESTHESIA/SEDATION: 2.0 mg IV Versed; 100 mcg IV Fentanyl Total Moderate Sedation Time:  33 minutes The patient's level of consciousness and physiologic status were continuously monitored during the procedure by Radiology nursing. Additional Medications: 2 g IV Ancef. FLUOROSCOPY TIME:  24 seconds.  2.0 mGy. PROCEDURE: The procedure, risks, benefits, and alternatives were explained to the patient. Questions regarding the procedure were encouraged and answered. The patient understands and consents to the procedure. A time-out was performed prior to initiating the procedure. Ultrasound was utilized to confirm patency of the right internal jugular vein. The right neck and chest were prepped with chlorhexidine in a sterile fashion, and a sterile drape was applied covering the operative field. Maximum barrier sterile technique with sterile gowns and gloves were used for the procedure. Local anesthesia was provided with 1% lidocaine. After creating a small venotomy incision, a 21 gauge needle was advanced into the right internal jugular vein under direct, real-time ultrasound guidance. Ultrasound image documentation was performed. After securing guidewire access, an 8 Fr dilator was placed. A J-wire was kinked to measure appropriate catheter length. A subcutaneous port pocket was then created along the upper chest wall utilizing sharp and blunt dissection. Portable cautery was utilized. The pocket was irrigated with  sterile saline. A single lumen power injectable port was chosen for placement. The 8 Fr catheter was tunneled from the port pocket site to the venotomy incision. The port was placed in the pocket. External catheter was trimmed to appropriate length based on guidewire measurement. At the venotomy, an 8 Fr peel-away sheath was placed over a guidewire. The catheter was then placed through the sheath and the sheath removed. Final catheter positioning was confirmed and documented with a fluoroscopic spot image. The port was accessed with a needle and aspirated and flushed with heparinized saline. The access needle was removed. The venotomy and port pocket incisions were closed with subcutaneous 3-0 Monocryl and subcuticular 4-0 Vicryl. Dermabond was applied to both incisions. COMPLICATIONS: COMPLICATIONS None FINDINGS: After catheter placement, the tip lies at the cavo-atrial junction. The catheter aspirates normally and is ready for immediate use. IMPRESSION: Placement of single lumen port a cath via right internal jugular vein. The catheter tip lies at the cavo-atrial junction. A power injectable port a cath was placed and is ready for immediate use. Electronically Signed   By: Aletta Edouard M.D.   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 EXAM: IMPLANTED PORT A CATH PLACEMENT WITH ULTRASOUND AND FLUOROSCOPIC GUIDANCE ANESTHESIA/SEDATION: 2.0 mg IV Versed; 100 mcg IV Fentanyl Total Moderate Sedation Time:  33 minutes The patient's level of consciousness and physiologic status were continuously monitored during the procedure by Radiology nursing. Additional Medications: 2 g IV Ancef. FLUOROSCOPY TIME:  24 seconds.  2.0 mGy. PROCEDURE: The procedure, risks, benefits, and alternatives were explained to the patient. Questions regarding the procedure were encouraged and answered. The  patient understands and consents to the procedure. A time-out  was performed prior to initiating the procedure. Ultrasound was utilized to confirm patency of the right internal jugular vein. The right neck and chest were prepped with chlorhexidine in a sterile fashion, and a sterile drape was applied covering the operative field. Maximum barrier sterile technique with sterile gowns and gloves were used for the procedure. Local anesthesia was provided with 1% lidocaine. After creating a small venotomy incision, a 21 gauge needle was advanced into the right internal jugular vein under direct, real-time ultrasound guidance. Ultrasound image documentation was performed. After securing guidewire access, an 8 Fr dilator was placed. A J-wire was kinked to measure appropriate catheter length. A subcutaneous port pocket was then created along the upper chest wall utilizing sharp and blunt dissection. Portable cautery was utilized. The pocket was irrigated with sterile saline. A single lumen power injectable port was chosen for placement. The 8 Fr catheter was tunneled from the port pocket site to the venotomy incision. The port was placed in the pocket. External catheter was trimmed to appropriate length based on guidewire measurement. At the venotomy, an 8 Fr peel-away sheath was placed over a guidewire. The catheter was then placed through the sheath and the sheath removed. Final catheter positioning was confirmed and documented with a fluoroscopic spot image. The port was accessed with a needle and aspirated and flushed with heparinized saline. The access needle was removed. The venotomy and port pocket incisions were closed with subcutaneous 3-0 Monocryl and subcuticular 4-0 Vicryl. Dermabond was applied to both incisions. COMPLICATIONS: COMPLICATIONS None FINDINGS: After catheter placement, the tip lies at the cavo-atrial junction. The catheter aspirates normally and is ready for immediate use. IMPRESSION: Placement of  single lumen port a cath via right internal jugular vein. The catheter tip lies at the cavo-atrial junction. A power injectable port a cath was placed and is ready for immediate use. Electronically Signed   By: Aletta Edouard M.D.   On: 12/30/2016 13:10   Results for orders placed or performed during the hospital encounter of 12/26/16 (from the past 72 hour(s))  CBC     Status: Abnormal   Collection Time: 12/30/16 10:34 AM  Result Value Ref Range   WBC 17.1 (H) 4.0 - 10.5 K/uL   RBC 4.48 3.87 - 5.11 MIL/uL   Hemoglobin 12.1 12.0 - 15.0 g/dL   HCT 36.7 36.0 - 46.0 %   MCV 81.9 78.0 - 100.0 fL   MCH 27.0 26.0 - 34.0 pg   MCHC 33.0 30.0 - 36.0 g/dL   RDW 15.1 11.5 - 15.5 %   Platelets 442 (H) 150 - 400 K/uL     HEENT: normal Cardio: RRR and No murmur Resp: CTA B/L and Unlabored GI: BS positive and Nontender, nondistended Extremity:  Edema 1+ pedal and ankle edema Skin:   Wound C/D/I Neuro: Alert/Oriented, Abnormal Sensory Paresthesias below T8 however has intact light touch sensation, Abnormal Motor 5/5 bilateral deltoid, bicep, tricep, grip, 3- in the left hip flexor knee extensor ankle dorsiflexor 2 in the right hip flexor knee extensor ankle dorsiflexor and Reflexes: 0 at the ankles, 3 plus at the knees Absent proprioception to great toe movement bilaterally. Musc/Skel:  Other No pain with lower extremity range of motion no pain with upper extremity range of motion no significant back pain when turning in bed General no acute distress   Assessment/Plan: 1. Functional deficits secondary to paraplegia from epidural mass, B cell, lymphoma which require 3+ hours per day of interdisciplinary therapy in a comprehensive inpatient rehab  setting. Physiatrist is providing close team supervision and 24 hour management of active medical problems listed below. Physiatrist and rehab team continue to assess barriers to discharge/monitor patient progress toward functional and medical  goals. FIM: Function - Bathing Position: Shower Body parts bathed by patient: Right arm, Left arm, Chest, Right upper leg, Left upper leg, Abdomen, Front perineal area, Buttocks Body parts bathed by helper: Right lower leg, Left lower leg Bathing not applicable: Back Assist Level: Touching or steadying assistance(Pt > 75%), Assistive device Assistive Device Comment: LH sponge  Function- Upper Body Dressing/Undressing What is the patient wearing?: Pull over shirt/dress, Bra Bra - Perfomed by patient: Thread/unthread left bra strap, Thread/unthread right bra strap, Hook/unhook bra (pull down sports bra) Pull over shirt/dress - Perfomed by patient: Thread/unthread right sleeve, Thread/unthread left sleeve, Put head through opening, Pull shirt over trunk Pull over shirt/dress - Perfomed by helper: Pull shirt over trunk Assist Level: Supervision or verbal cues Function - Lower Body Dressing/Undressing What is the patient wearing?: Pants Position: Wheelchair/chair at sink Pants- Performed by patient: Thread/unthread right pants leg, Thread/unthread left pants leg, Pull pants up/down Pants- Performed by helper: Thread/unthread left pants leg Non-skid slipper socks- Performed by patient: Don/doff right sock, Don/doff left sock(using sock aid ) Non-skid slipper socks- Performed by helper: Don/doff right sock, Don/doff left sock Assist for footwear: Supervision/touching assist Assist for lower body dressing: Touching or steadying assistance (Pt > 75%) Assistive Device Comment: reacher  Function - Toileting Toileting steps completed by patient: Adjust clothing prior to toileting, Performs perineal hygiene, Adjust clothing after toileting Toileting steps completed by helper: Adjust clothing prior to toileting, Adjust clothing after toileting Toileting Assistive Devices: Grab bar or rail Assist level: Touching or steadying assistance (Pt.75%)  Function - Air cabin crew transfer  assistive device: Elevated toilet seat/BSC over toilet, Grab bar Assist level to toilet: Touching or steadying assistance (Pt > 75%) Assist level from toilet: Touching or steadying assistance (Pt > 75%) Assist level to bedside commode (at bedside): Touching or steadying assistance (Pt > 75%) Assist level from bedside commode (at bedside): Touching or steadying assistance (Pt > 75%)  Function - Chair/bed transfer Chair/bed transfer method: Stand pivot Chair/bed transfer assist level: Touching or steadying assistance (Pt > 75%) Chair/bed transfer assistive device: Walker, Armrests Chair/bed transfer details: Verbal cues for precautions/safety, Verbal cues for safe use of DME/AE, Verbal cues for technique, Verbal cues for sequencing  Function - Locomotion: Wheelchair Type: Manual Max wheelchair distance: 100 Assist Level: Supervision or verbal cues Assist Level: Supervision or verbal cues Wheel 150 feet activity did not occur: Safety/medical concerns(fatigue) Function - Locomotion: Ambulation Assistive device: Walker-rolling Max distance: 5 Assist level: 2 helpers(minA, w/c follow) Assist level: Touching or steadying assistance (Pt > 75%) Walk 50 feet with 2 turns activity did not occur: Safety/medical concerns Walk 150 feet activity did not occur: Safety/medical concerns Walk 10 feet on uneven surfaces activity did not occur: Safety/medical concerns  Function - Comprehension Comprehension: Auditory Comprehension assist level: Follows complex conversation/direction with no assist  Function - Expression Expression: Verbal Expression assist level: Expresses complex ideas: With no assist  Function - Social Interaction Social Interaction assist level: Interacts appropriately with others - No medications needed.  Function - Problem Solving Problem solving assist level: Solves complex problems: Recognizes & self-corrects  Function - Memory Memory assist level: Complete Independence:  No helper Patient normally able to recall (first 3 days only): Current season, Location of own room, Staff names and faces, That he or  she is in a hospital   Medical Problem List and Plan: 1. 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 Formulized individual treatment plan based on medical history, underlying problem and comorbidities. 2. DVT Prophylaxis/Anticoagulation: Pharmaceutical:Lovenoxadded as > week post op. Check dopplers  3. Pain Management:oxycodone prn effective, slept well last night, no change to regimen 4. Mood:LCSW to follow for evaluation and support. Reports that she is managing to deal with diagnosis 5. Neuropsych: This patientiscapable of making decisions on herown behalf. 6. Skin/Wound Care:routine pressure relief measures 7. Fluids/Electrolytes/Nutrition:Monitor I/O. poor oral intake 240 mL yesterday, encourage fluids 8.HTN: Monitor BP bid. Continue Norvasc and dyazide.  Blood pressure controlled 12/31/2016 Vitals:   12/30/16 2047 12/31/16 0500  BP: 138/67 (!) 155/74  Pulse: 77 79  Resp:  15  Temp:  98 F (36.7 C)  SpO2: 98% 96%   9. ABLA: on iron. Repeat hgb 11/9 is stable' 10. Leucocytosis: Likely reactive due to steroids. Monitor for signs of infection.  11. AKI: Due to dehydration . Encourage fluid intake. Repeat BUN/Creat 11/9 stable 12. Hyponatremia: Will monitor for [email or web address] asymptomatic, may be related to diuretic  LOS (Days) 5 A FACE TO FACE EVALUATION WAS PERFORMED  Eleyna Brugh E 12/31/2016, 8:57 AM

## 2016-12-31 NOTE — Progress Notes (Signed)
Physical Therapy Session Note  Patient Details  Name: Katrina [name] MRN: [id number] Date of Birth: [birth year]  Today's Date: 12/31/2016 PT Individual Time: 0800-0900 and 1500-1540 PT Individual Time Calculation (min): 60 min and 40 min (total 100 min)   Short Term Goals: Week 1:  PT Short Term Goal 1 (Week 1): Pt will demonstrate bed mobility consistent S PT Short Term Goal 2 (Week 1): Pt will demonstrate transfer w/c <>bed with consistent minA PT Short Term Goal 3 (Week 1): Pt will ambulate x50' minA PT Short Term Goal 4 (Week 1): Pt will demonstrate dynamic standing balance x5 min with minA  Skilled Therapeutic Interventions/Progress Updates: Tx 1: Pt received supine in bed, denies pain and agreeable to treatment. Reports she "had a meltdown last night" after transferring and lost control of LLE, requiring a second person to come assist with transfer. Offered emotional support, discussed how fatigue affects coordination/strength later in the day and pt also had procedure yesterday which may have affected energy level, etc. Supine>sit with S and HOB elevated. Stand pivot transfer bed>w/c with RW and minA. Stand pivot transfer w/c <>BSC over toilet with grab bars and minA, min cues for positioning to center in w/c before sitting. W/c management within room to retrieve clothes. Dons bra, shirt with modI and increased time, dons pants with grabber and min guard for standing balance with RW. W/c propulsion to/from gym with BUE and S x150 for strengthening and endurance. Stand pivot transfer to mat with RW and minA. Standing alternating UE horseshoe throw for focus on dynamic balance and balance reaction strategies, cues to maintain LEs off mat table and note recurvatum BLE. Gait x2 trials of 5' each with RW and minA; w/c follow for safety d/t occasional buckling LLE. Ace wrap used on L knee to increase proprioceptive input. Returned to room as above; remained seated in w/c at end of session, all needs  in reach.   Tx 2: Pt received seated in recliner, denies pain and agreeable to treatment. Stand pivot transfer recliner>w/c with RW and w/c <>BSC over toilet with grab bars minA overall, min cues for positioning to align with toilet. Pt dons/doffs pants with min guard for balance while alternating UE to reach pants. Sit <>stand x5 reps from with with close S/min guard, cues for safety when reaching back to w/c with RUE d/t tendency to lose balance to L. Gait x8' with RW and min/modA, w/c follow for safety; three LOBs to L side with LLE unstable and pt beginning to panic, however able to recover balance with cueing and increased time on first two LOBs, returned to w/c on third LOB d/t fatigue. Stand pivot transfer to recliner with minA and RW. Provided pt with handout for home measurements to give to son, added section for w/c measurements to assess size of seat to fit patient as well as total width to determine accessibility in the home. Remained seated in w/c at end of session, all needs in reach.      Therapy Documentation Precautions:  Precautions Precautions: Fall, Back Precaution Booklet Issued: Yes (comment) Precaution Comments: Able to state 3/3 back precautions; maintains precautions during mobility with min cues Restrictions Weight Bearing Restrictions: No   See Function Navigator for Current Functional Status.   Therapy/Group: Individual Therapy  Luberta Mutter 12/31/2016, 8:57 AM

## 2016-12-31 NOTE — Progress Notes (Signed)
Physical Therapy Note  Patient Details  Name: Katrina [name] MRN: [id number] Date of Birth: Feb 02, 1951 Today's Date: 12/31/2016    Time: 1130-1145 15 minutes make up time  1:1 No c/o pain.  Pt states she is fatigued from 3 hours of therapy this morning but is willing to participate with encouragement. Pt performs w/c mobility for UE strength and endurance 150', 100', 50' with supervision.   Lachlan Pelto 12/31/2016, 12:12 PM

## 2016-12-31 NOTE — Care Management (Signed)
[facility] Individual Statement of Services  Patient Name:  Katrina [name]  Date:  12/31/2016  Welcome to the [facility].  Our goal is to provide you with an individualized program based on your diagnosis and situation, designed to meet your specific needs.  With this comprehensive rehabilitation program, you will be expected to participate in at least 3 hours of rehabilitation therapies Monday-Friday, with modified therapy programming on the weekends.  Your rehabilitation program will include the following services:  Physical Therapy (PT), Occupational Therapy (OT), 24 hour per day rehabilitation nursing, Therapeutic Recreaction (TR), Neuropsychology, Case Management (Social Worker), Rehabilitation Medicine, Nutrition Services and Pharmacy Services  Weekly team conferences will be held on Tuesdays to discuss your progress.  Your Social Worker will talk with you frequently to get your input and to update you on team discussions.  Team conferences with you and your family in attendance may also be held.  Expected length of stay: 14 days  Overall anticipated outcome: supervision/ min assist  Depending on your progress and recovery, your program may change. Your Social Worker will coordinate services and will keep you informed of any changes. Your Social Worker's name and contact numbers are listed  below.  The following services may also be recommended but are not provided by the [facility] will be made to provide these services after discharge if needed.  Arrangements include referral to agencies that provide these services.  Your insurance has been verified to be:  [facility] Medicare Your primary doctor is:  (was in process of establishing new primary care with Dr. Karie Kirks)  Pertinent information will be shared with  your doctor and your insurance company.  Social Worker:  [place], [place] or ([phone]   Information discussed with and copy given to patient by: Lennart Pall, 12/31/2016, 6:18 PM

## 2017-01-01 ENCOUNTER — Other Ambulatory Visit: Payer: Self-pay | Admitting: Hematology and Oncology

## 2017-01-01 ENCOUNTER — Inpatient Hospital Stay (HOSPITAL_COMMUNITY): Payer: Medicare Other

## 2017-01-01 ENCOUNTER — Inpatient Hospital Stay (HOSPITAL_COMMUNITY): Payer: Medicare Other | Admitting: Physical Therapy

## 2017-01-01 ENCOUNTER — Inpatient Hospital Stay (HOSPITAL_COMMUNITY): Payer: Medicare Other | Admitting: *Deleted

## 2017-01-01 ENCOUNTER — Inpatient Hospital Stay (HOSPITAL_COMMUNITY): Payer: Medicare Other | Admitting: Occupational Therapy

## 2017-01-01 DIAGNOSIS — D72828 Other elevated white blood cell count: Secondary | ICD-10-CM

## 2017-01-01 DIAGNOSIS — I10 Essential (primary) hypertension: Secondary | ICD-10-CM

## 2017-01-01 NOTE — Progress Notes (Signed)
Occupational Therapy Session Note  Patient Details  Name: Katrina [name] MRN: [id number] Date of Birth: 1950/04/08  Today's Date: 01/01/2017 OT Individual Time: 1131-1200 OT Individual Time Calculation (min): 29 min   Short Term Goals: Week 1:  OT Short Term Goal 1 (Week 1): Pt will transfer to BSC/ toilet with min A with LRAD OT Short Term Goal 2 (Week 1): Pt will don UB clothing mod I  OT Short Term Goal 3 (Week 1): Pt will dress LB with mod a including shoes OT Short Term Goal 4 (Week 1): Pt will transfer into tub shower with tub bench with min A.   Skilled Therapeutic Interventions/Progress Updates:    OT treatment session focused on functional trasnfers, standing endurance/balance, and UB/LB there-ex. Pt completed stand-pivot to raised toilet with min A. Able to manage clothing using leaning method. Pt voided bladder and completed peri-care with set-up A. Worked on standing endurance with standing grooming task to wash hands for 1 minute. LB there-ex using green thera-band focused on hip abd/add, then UB there-ex of triceps press and biceps curl. Pt left seated in wc at end of session with needs met and call bell in reach.   Therapy Documentation Precautions:  Precautions Precautions: Fall Precaution Booklet Issued: Yes (comment) Precaution Comments: Able to state 3/3 back precautions; maintains precautions during mobility with min cues Restrictions Weight Bearing Restrictions: No Pain: Pain Assessment Pain Assessment: 0-10 Pain Score: 2 Pain Type: Acute pain Pain Descriptors / Indicators: Aching Pain Frequency: Intermittent Pain Onset: With Activity Patients Stated Pain Goal: 2 Pain Intervention(s): Repositioned Multiple Pain Sites: No ADL: ADL ADL Comments: see functional navigator  See Function Navigator for Current Functional Status.   Therapy/Group: Individual Therapy  Valma Cava 01/01/2017, 12:00 PM

## 2017-01-01 NOTE — Progress Notes (Signed)
Social Work Patient ID: Katrina [name], female   DOB: 25-Aug-1950, 66 y.o.   MRN: [id number]   Met with pt and son, Mitzi Hansen, yesterday afternoon to review team conference.  Pt reporting that Dr. Alvy Bimler had just been by to speak with her as well.  They are both aware and agreeable with targeted d/c date of 01/08/17.  Pt notes (and this is confirmed in Dr. Alvy Bimler' note) that Dr. Alvy Bimler also aware and targeted date and plans for pt to d/c home on 11/21 to return to [facility] on Monday 11/26 to begin her treatment protocol.  Pt and son are pleased with this plan and pt looking for to "a little break from the hospital...".   I reviewed with both that overall goals are being set for minimal assistance (may be supervision for some activities) and that she must have 24/7 support.  I attempted to confirm with son that he and family can provide this.  He makes little eye contact and appears a little overwhelmed as he states, "We'll work it out somehow."  Also, noted to pt and son that her assistance needs could vary as she goes through her chemo.  Discussed need to complete family education prior to d/c and have tentatively planned for pt's sister and granddaughter to come in on Tuesday - will confirm end of this week.  Continue to follow.  Eugene Isadore, LCSW

## 2017-01-01 NOTE — Progress Notes (Signed)
Occupational Therapy Session Note  Patient Details  Name: JOURNEI THOMASSEN MRN: [id number] Date of Birth: 10/24/1950  Today's Date: 01/01/2017 OT Individual Time: 1245-8099 OT Individual Time Calculation (min): 58 min    Short Term Goals: Week 1:  OT Short Term Goal 1 (Week 1): Pt will transfer to BSC/ toilet with min A with LRAD OT Short Term Goal 2 (Week 1): Pt will don UB clothing mod I  OT Short Term Goal 3 (Week 1): Pt will dress LB with mod a including shoes OT Short Term Goal 4 (Week 1): Pt will transfer into tub shower with tub bench with min A.   Skilled Therapeutic Interventions/Progress Updates:    Pt engaged in BADL retraining including toilet transfers, toileting, shower transfers, bathing at shower level, and dressing with sit<>stand from w/c at sink.  Pt required min A for stand pivot transfers with use of grab bars.  Pt completed bathing and dressing tasks requiring steady A for standing.  Pt uses AE appropriately for bathing and dressing tasks.  Pt remained in w/c with all needs within reach.  Focus on activity tolerance, sit<>stand, standing balance, functional transfers, and safety awareness to increase independence with BADLs.   Therapy Documentation Precautions:  Precautions Precautions: Fall Precaution Booklet Issued: Yes (comment) Precaution Comments: Able to state 3/3 back precautions; maintains precautions during mobility with min cues Restrictions Weight Bearing Restrictions: No  Pain: Pt denies pain  See Function Navigator for Current Functional Status.   Therapy/Group: Individual Therapy  Leroy Libman 01/01/2017, 9:03 AM

## 2017-01-01 NOTE — Patient Care Conference (Signed)
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 Paraparesis ([place]) 12/19/2016  . Chest wall mass 12/19/2016  . 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 S/P lumbar laminectomy 12/18/2016  . Numbness and tingling 12/13/2016  . Numbness and tingling of both lower extremities 12/12/2016  . Hypertension 12/12/2016  . Anemia 12/12/2016  . Abdominal lymphadenopathies 12/12/2016  . Renal mass, right 12/12/2016  . Constipation 12/12/2016  . FH: colon cancer 10/22/2013  . RUQ discomfort 10/22/2013    Expected Discharge Date: Expected Discharge Date: 01/08/17  Team Members Present: Physician leading conference: Dr. Delice Lesch Social Worker Present: Lennart Pall, LCSW Nurse Present: Benjie Karvonen, RN PT Present: Kem Parkinson, PT OT Present: Willeen Cass, OT SLP Present: Charolett Bumpers, SLP PPS Coordinator present : Daiva Nakayama, RN, CRRN     Current Status/Progress Goal Weekly Team Focus  Medical   Port-A-Cath placed yesterday.  Pain control improving, paraparesis as well as sensory loss in both lower limbs  Improved strength and mobility prior to initiation of chemotherapy  Optimize pain medication regimen.   Bowel/Bladder   Continent of Bowel/bladder, LBM 12/29/2016  Maintain Continence  Assess toileting needs QS and prn, administered  schedule stool softener address 2-3 days if no results   Swallow/Nutrition/ Hydration             ADL's   Min assist bathing, Supervision/setup dressing with use of AE, min/mod assist stand pivot transfers with RW d/t ataxia and occasional buckling  Supervision overall, Min A shower transfer  transfers, activity tolerance, LB bathing/dressing with use of AE   Mobility   S bed mobility, min/modA stand pivot transfers and gait x10' d/t occasional buckling and ataxia  modI bed mobility, S overall with LRAD  LE coordination, transfers/gait training, activity tolerance   Communication             Safety/Cognition/ Behavioral Observations  Alert, Unsteady gait, assistance x2 while OOB,Walker and stand pivot   No falls while on Rehab or in [place] safety precautionary measures, educations patient, family amd staff daily and QS monitoring, Bed and chair alarms   Pain   Rating pain scale surgical area back 8-10 prior to medication f/u pain 47m medicated with  prn   goal pain < 3  Assess pain QS and prn administer meds and pre- administer prior to therapy, note release of medication, educate   Skin   Surgical site to back and porta cath site rt upper chest dressing/ steri strips intact and w/o drainage,   Preventive care -  No infections to surgical areas   Assess surgical areas QS and prn, report changes, fever, draimage and signs of infection,     Rehab Goals Patient on target to meet rehab goals: Yes *See Care Plan and progress notes for long and  short-term goals.     Barriers to Discharge  Current Status/Progress Possible Resolutions Date Resolved   Physician    Medical stability;Pending chemo/radiation     Progressing towards goals  Continue rehab program with plan of discharge to acute care for chemotherapy      Nursing                  PT  Inaccessible home environment;Pending chemo/radiation                 OT                  SLP                SW                 Discharge Planning/Teaching Needs:  Plan to d/c home to son's home with family arranging 24/7 assistance if needed.  Need to coordinate timing of admit to [facility] to start chemo.  Teaching needs TBD   Team Discussion:  Medically doing well overall;  Oncology following.  Need to encourage fluids.  Min-mod with tfs and occasional knee buckling.  Ambulating very short distances.  Goals being set for supervision to min assist overall.  Need to keep in mind that she may be very weakened by chemo txs and prepare family/ caregiver for this possibiliey.  Revisions to Treatment Plan:  None    Continued Need for Acute Rehabilitation Level of Care: The patient requires daily medical management by a physician with specialized training in physical medicine and rehabilitation for the following conditions: Daily direction of a multidisciplinary physical rehabilitation program to ensure safe treatment while eliciting the highest outcome that is of practical value to the patient.: Yes Daily medical management of patient stability for increased activity during participation in an intensive rehabilitation regime.: Yes Daily analysis of laboratory values and/or radiology reports with any subsequent need for medication adjustment of medical intervention for : Neurological problems;Other  Katrina [name] 01/01/2017, 12:18 PM

## 2017-01-01 NOTE — Progress Notes (Signed)
Recreational Therapy Session Note  Patient Details  Name: Katrina [name] MRN: [id number] Date of Birth: 1950/11/22 Today's Date: 01/01/2017  Pain: no c/o Skilled Therapeutic Interventions/Progress Updates: Session focused on activity tolerance, sit-stands, stand pivot trasfer, dynamic sitting balance during animal assisted therapy. Pt performed stand pivot transfers with min assist with exception of getting up from rocking recliner in ADL apartment in which she required mod assist.  Pt performed w/c mobility using BUEs with supervision on the unit. [place] 01/01/2017, 12:05 PM

## 2017-01-01 NOTE — Progress Notes (Signed)
Subjective/Complaints: Feeling quite well. Encouraged by therapy progress. Pain controlled  ROS: pt denies nausea, vomiting, diarrhea, cough, shortness of breath or chest pain   Objective: Vital Signs: Blood pressure (!) 150/90, pulse 96, temperature 97.9 F (36.6 C), temperature source Oral, resp. rate 16, weight 114 kg (251 lb 5.2 oz), SpO2 96 %. 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 EXAM: IMPLANTED PORT A CATH PLACEMENT WITH ULTRASOUND AND FLUOROSCOPIC GUIDANCE ANESTHESIA/SEDATION: 2.0 mg IV Versed; 100 mcg IV Fentanyl Total Moderate Sedation Time:  33 minutes The patient's level of consciousness and physiologic status were continuously monitored during the procedure by Radiology nursing. Additional Medications: 2 g IV Ancef. FLUOROSCOPY TIME:  24 seconds.  2.0 mGy. PROCEDURE: The procedure, risks, benefits, and alternatives were explained to the patient. Questions regarding the procedure were encouraged and answered. The patient understands and consents to the procedure. A time-out was performed prior to initiating the procedure. Ultrasound was utilized to confirm patency of the right internal jugular vein. The right neck and chest were prepped with chlorhexidine in a sterile fashion, and a sterile drape was applied covering the operative field. Maximum barrier sterile technique with sterile gowns and gloves were used for the procedure. Local anesthesia was provided with 1% lidocaine. After creating a small venotomy incision, a 21 gauge needle was advanced into the right internal jugular vein under direct, real-time ultrasound guidance. Ultrasound image documentation was performed. After securing guidewire access, an 8 Fr dilator was placed. A J-wire was kinked to measure appropriate catheter length. A subcutaneous port pocket was then created along the upper chest wall utilizing sharp and  blunt dissection. Portable cautery was utilized. The pocket was irrigated with sterile saline. A single lumen power injectable port was chosen for placement. The 8 Fr catheter was tunneled from the port pocket site to the venotomy incision. The port was placed in the pocket. External catheter was trimmed to appropriate length based on guidewire measurement. At the venotomy, an 8 Fr peel-away sheath was placed over a guidewire. The catheter was then placed through the sheath and the sheath removed. Final catheter positioning was confirmed and documented with a fluoroscopic spot image. The port was accessed with a needle and aspirated and flushed with heparinized saline. The access needle was removed. The venotomy and port pocket incisions were closed with subcutaneous 3-0 Monocryl and subcuticular 4-0 Vicryl. Dermabond was applied to both incisions. COMPLICATIONS: COMPLICATIONS None FINDINGS: After catheter placement, the tip lies at the cavo-atrial junction. The catheter aspirates normally and is ready for immediate use. IMPRESSION: Placement of single lumen port a cath via right internal jugular vein. The catheter tip lies at the cavo-atrial junction. A power injectable port a cath was placed and is ready for immediate use. Electronically Signed   By: Aletta Edouard M.D.   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 EXAM: IMPLANTED PORT A CATH PLACEMENT WITH ULTRASOUND AND FLUOROSCOPIC GUIDANCE ANESTHESIA/SEDATION: 2.0 mg IV Versed; 100 mcg IV Fentanyl Total Moderate Sedation Time:  33 minutes The patient's level of consciousness and physiologic status were continuously monitored during the procedure by Radiology nursing. Additional Medications: 2 g IV Ancef. FLUOROSCOPY TIME:  24 seconds.  2.0 mGy. PROCEDURE: The procedure, risks, benefits, and alternatives were explained to  the patient.  Questions regarding the procedure were encouraged and answered. The patient understands and consents to the procedure. A time-out was performed prior to initiating the procedure. Ultrasound was utilized to confirm patency of the right internal jugular vein. The right neck and chest were prepped with chlorhexidine in a sterile fashion, and a sterile drape was applied covering the operative field. Maximum barrier sterile technique with sterile gowns and gloves were used for the procedure. Local anesthesia was provided with 1% lidocaine. After creating a small venotomy incision, a 21 gauge needle was advanced into the right internal jugular vein under direct, real-time ultrasound guidance. Ultrasound image documentation was performed. After securing guidewire access, an 8 Fr dilator was placed. A J-wire was kinked to measure appropriate catheter length. A subcutaneous port pocket was then created along the upper chest wall utilizing sharp and blunt dissection. Portable cautery was utilized. The pocket was irrigated with sterile saline. A single lumen power injectable port was chosen for placement. The 8 Fr catheter was tunneled from the port pocket site to the venotomy incision. The port was placed in the pocket. External catheter was trimmed to appropriate length based on guidewire measurement. At the venotomy, an 8 Fr peel-away sheath was placed over a guidewire. The catheter was then placed through the sheath and the sheath removed. Final catheter positioning was confirmed and documented with a fluoroscopic spot image. The port was accessed with a needle and aspirated and flushed with heparinized saline. The access needle was removed. The venotomy and port pocket incisions were closed with subcutaneous 3-0 Monocryl and subcuticular 4-0 Vicryl. Dermabond was applied to both incisions. COMPLICATIONS: COMPLICATIONS None FINDINGS: After catheter placement, the tip lies at the cavo-atrial junction. The  catheter aspirates normally and is ready for immediate use. IMPRESSION: Placement of single lumen port a cath via right internal jugular vein. The catheter tip lies at the cavo-atrial junction. A power injectable port a cath was placed and is ready for immediate use. Electronically Signed   By: Aletta Edouard M.D.   On: 12/30/2016 13:10   Results for orders placed or performed during the hospital encounter of 12/26/16 (from the past 72 hour(s))  CBC     Status: Abnormal   Collection Time: 12/30/16 10:34 AM  Result Value Ref Range   WBC 17.1 (H) 4.0 - 10.5 K/uL   RBC 4.48 3.87 - 5.11 MIL/uL   Hemoglobin 12.1 12.0 - 15.0 g/dL   HCT 36.7 36.0 - 46.0 %   MCV 81.9 78.0 - 100.0 fL   MCH 27.0 26.0 - 34.0 pg   MCHC 33.0 30.0 - 36.0 g/dL   RDW 15.1 11.5 - 15.5 %   Platelets 442 (H) 150 - 400 K/uL     HEENT: normal Cardio: RRR without murmur. No JVD  Resp: CTA Bilaterally without wheezes or rales. Normal effort  GI: BS positive and Nontender, nondistended Extremity:  Trace LE edema Skin:   Wound C/D/I with steristrips Neuro: Alert/Oriented, Abnormal Sensory Paresthesias below T8 however has intact light touch sensation, Abnormal Motor 5/5 bilateral deltoid, bicep, tricep, grip, 3 in the left hip flexor knee extensor ankle dorsiflexor 3 in the right hip flexor knee extensor ankle dorsiflexor and Reflexes: 0 at the ankles, 3 plus at the knees Absent proprioception in feet which improves gradually as you move proximally up the legs.  Musc/Skel:  Other No pain with lower extremity range of motion no pain with upper extremity range of motion no significant back pain when turning in bed Psych:  pleasant   Assessment/Plan: 1. 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 Physiatrist is providing close team supervision and 24 hour management of active medical  problems listed below. Physiatrist and rehab team continue to assess barriers to discharge/monitor patient progress toward functional and medical goals. FIM: Function - Bathing Position: Shower Body parts bathed by patient: Right arm, Left arm, Chest, Right upper leg, Left upper leg, Abdomen, Front perineal area, Buttocks, Right lower leg, Left lower leg Body parts bathed by helper: Right lower leg, Left lower leg Bathing not applicable: Back Assist Level: Touching or steadying assistance(Pt > 75%), Assistive device Assistive Device Comment: LH sponge  Function- Upper Body Dressing/Undressing What is the patient wearing?: Pull over shirt/dress, Bra Bra - Perfomed by patient: Thread/unthread left bra strap, Thread/unthread right bra strap, Hook/unhook bra (pull down sports bra) Pull over shirt/dress - Perfomed by patient: Thread/unthread right sleeve, Thread/unthread left sleeve, Put head through opening, Pull shirt over trunk Pull over shirt/dress - Perfomed by helper: Pull shirt over trunk Assist Level: Supervision or verbal cues Function - Lower Body Dressing/Undressing What is the patient wearing?: Pants, Non-skid slipper socks Position: Wheelchair/chair at sink Pants- Performed by patient: Thread/unthread right pants leg, Thread/unthread left pants leg, Pull pants up/down Pants- Performed by helper: Thread/unthread left pants leg Non-skid slipper socks- Performed by patient: Don/doff right sock, Don/doff left sock Non-skid slipper socks- Performed by helper: Don/doff right sock, Don/doff left sock Assist for footwear: Supervision/touching assist Assist for lower body dressing: Touching or steadying assistance (Pt > 75%) Assistive Device Comment: reacher  Function - Toileting Toileting steps completed by patient: Adjust clothing prior to toileting, Performs perineal hygiene, Adjust clothing after toileting Toileting steps completed by helper: Adjust clothing prior to toileting,  Adjust clothing after toileting Toileting Assistive Devices: Grab bar or rail Assist level: Touching or steadying assistance (Pt.75%)  Function - Air cabin crew transfer assistive device: Elevated toilet seat/BSC over toilet, Grab bar Assist level to toilet: Touching or steadying assistance (Pt > 75%) Assist level from toilet: Touching or steadying assistance (Pt > 75%) Assist level to bedside commode (at bedside): Touching or steadying assistance (Pt > 75%) Assist level from bedside commode (at bedside): Touching or steadying assistance (Pt > 75%)  Function - Chair/bed transfer Chair/bed transfer method: Stand pivot Chair/bed transfer assist level: Touching or steadying assistance (Pt > 75%) Chair/bed transfer assistive device: Walker, Armrests Chair/bed transfer details: Verbal cues for precautions/safety, Verbal cues for safe use of DME/AE, Verbal cues for sequencing  Function - Locomotion: Wheelchair Will patient use wheelchair at discharge?: Yes Type: Manual Max wheelchair distance: 160 Assist Level: Supervision or verbal cues Assist Level: Supervision or verbal cues Wheel 150 feet activity did not occur: Safety/medical concerns(fatigue) Assist Level: Supervision or verbal cues Function - Locomotion: Ambulation Assistive device: Walker-rolling Max distance: 5 Assist level: Moderate assist (Pt 50 - 74%) Assist level: Touching or steadying assistance (Pt > 75%) Walk 50 feet with 2 turns activity did not occur: Safety/medical concerns Walk 150 feet activity did not occur: Safety/medical concerns Walk 10 feet on uneven surfaces activity did not occur: Safety/medical concerns  Function - Comprehension Comprehension: Auditory Comprehension assist level: Follows complex conversation/direction with no assist  Function - Expression Expression: Verbal Expression assist level: Expresses complex ideas: With no assist  Function - Social Interaction Social Interaction  assist level: Interacts appropriately with others - No medications needed.  Function - Problem Solving Problem solving assist level: Solves complex problems: Recognizes & self-corrects  Function - Memory Memory assist level: Complete Independence: No helper Patient normally able to recall (first 3 days only): Current season, Location of own room, Staff names and faces, That he or she is in a hospital   Medical Problem List and Plan: 1. 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 DVT Prophylaxis/Anticoagulation: Pharmaceutical:Lovenoxadded as > week post op. Dopplers negative  3. Pain Management:oxycodone prn effective, continue 4. Mood:LCSW to follow for evaluation and support. Reports that she is managing to deal with diagnosis 5. Neuropsych: This patientiscapable of making decisions on herown behalf. 6. Skin/Wound Care:routine pressure relief measures 7. Fluids/Electrolytes/Nutrition:Monitor I/O. poor oral intake 240 mL yesterday, encourage fluids 8.HTN: Monitor BP bid. Continue Norvasc and dyazide.  BP under fair control Vitals:   12/31/16 2100 01/01/17 0434  BP: 138/76 (!) 150/90  Pulse: 72 96  Resp:  16  Temp:  97.9 F (36.6 C)  SpO2: 95% 96%   9. ABLA: on iron. Repeat hgb 11/9 is stable' 10. Leucocytosis: Likely reactive due to steroids. Monitor for signs of infection.   -recheck later this week 11. AKI: Due to dehydration . Encourage fluid intake.   12. Hyponatremia: Will monitor for [email or web address] asymptomatic, may be related to diuretic  LOS (Days) 6 A FACE TO FACE EVALUATION WAS PERFORMED  Dinesh Ulysse T 01/01/2017, 8:47 AM

## 2017-01-01 NOTE — Progress Notes (Signed)
Occupational Therapy Note  Patient Details  Name: NIANA MARTORANA MRN: [id number] Date of Birth: [birth year]  Today's Date: 01/01/2017 OT Individual Time: 1300-1400 OT Individual Time Calculation (min): 60 min   Pt denies pain Individual therapy  Pt resting in w/c upon arrival.  Initial focus on ongoing discharge planning.  Pt states her w/c will not fit into bathroom and she will need to walk ~5' to acces tub and toilet at home.  Pt states she already own a tub transfer bench and BSC.  Focus transitioned to dynamic standing tasks for table activities with BUE use.  Pt initially challenged with retrieving towels from floor using reacher while standing with RW and folding towels.  Pt exhibited increased anxiety with rolling table placed in front.  Pt able to retrieve and fold one towel before sitting back into w/c.  Pt exhibited anterior LOB on second attempt and required assistance to sit back into w/c.  Pt stated she did not "feel" herself leaning forward past BOS.  Pt transitioned to stationary table activities while standing.  Pt continues to exhibit increased anxiety when standing.  Pt returned to room and used toilet requiring min A for transfer and mod a for toileting tasks.  Pt remained in w/c with all needs within reach.    [name] [facility] 01/01/2017, 2:45 PM

## 2017-01-01 NOTE — Progress Notes (Signed)
Physical Therapy Session Note  Patient Details  Name: Katrina [name] MRN: [id number] Date of Birth: 1950/08/31  Today's Date: 01/01/2017 PT Individual Time: 0900-1000 PT Individual Time Calculation (min): 60 min   Short Term Goals: Week 1:  PT Short Term Goal 1 (Week 1): Pt will demonstrate bed mobility consistent S PT Short Term Goal 2 (Week 1): Pt will demonstrate transfer w/c <>bed with consistent minA PT Short Term Goal 3 (Week 1): Pt will ambulate x50' minA PT Short Term Goal 4 (Week 1): Pt will demonstrate dynamic standing balance x5 min with minA  Skilled Therapeutic Interventions/Progress Updates: Pt received seated in w/c, c/o pain as below and pre-medicated and agreeable to treatment. Stand pivot transfer w/c <>mat table with RW and min guard. Standing balance on airex foam pad with gradual reduction in UE support; min guard and tactile cueing at L knee to reduced recurvatum. Standing balance in litegait with pre-gait stepping in place; pt with significant anxiety standing in litegait on treadmill. Trialed litegait with RW; ambulated x40' including one 180 degree turn with min guard overall; occasional LLE buckling with litegait providing support to regain balance. Second gait trial with litegait in hallway x50' (no turns) with min guard; cues for upright posture, and pt self-selecting slightly increased gait speed with improved coordination and smoothness of gait. Returned to room modI w/c propulsion at end of session.      Therapy Documentation Precautions:  Precautions Precautions: Fall Precaution Booklet Issued: Yes (comment) Precaution Comments: Able to state 3/3 back precautions; maintains precautions during mobility with min cues Restrictions Weight Bearing Restrictions: No Pain: Pain Assessment Pain Assessment: 0-10 Pain Score: 5  Pain Type: Acute pain Pain Descriptors / Indicators: Aching Pain Frequency: Intermittent Pain Onset: With Activity Patients Stated Pain  Goal: 2 Pain Intervention(s): Medication (See eMAR);Repositioned Multiple Pain Sites: No   See Function Navigator for Current Functional Status.   Therapy/Group: Individual Therapy  Luberta Mutter 01/01/2017, 11:19 AM

## 2017-01-02 ENCOUNTER — Inpatient Hospital Stay (HOSPITAL_COMMUNITY): Payer: Medicare Other

## 2017-01-02 ENCOUNTER — Inpatient Hospital Stay (HOSPITAL_COMMUNITY): Payer: Medicare Other | Admitting: Physical Therapy

## 2017-01-02 LAB — BASIC METABOLIC PANEL
Anion gap: 9 (ref 5–15)
BUN: 24 mg/dL — AB (ref 6–20)
CALCIUM: 8.9 mg/dL (ref 8.9–10.3)
CHLORIDE: 95 mmol/L — AB (ref 101–111)
CO2: 27 mmol/L (ref 22–32)
CREATININE: 0.83 mg/dL (ref 0.44–1.00)
GFR calc Af Amer: >60 mL/min (ref >60)
GFR calc non Af Amer: >60 mL/min (ref >60)
Glucose, Bld: 105 mg/dL — ABNORMAL HIGH (ref 65–99)
Potassium: 3.3 mmol/L — ABNORMAL LOW (ref 3.5–5.1)
Sodium: 131 mmol/L — ABNORMAL LOW (ref 135–145)

## 2017-01-02 LAB — CBC
HEMATOCRIT: 34 % — AB (ref 36.0–46.0)
HEMOGLOBIN: 11.2 g/dL — AB (ref 12.0–15.0)
MCH: 27 pg (ref 26.0–34.0)
MCHC: 32.9 g/dL (ref 30.0–36.0)
MCV: 81.9 fL (ref 78.0–100.0)
Platelets: 340 10*3/uL (ref 150–400)
RBC: 4.15 MIL/uL (ref 3.87–5.11)
RDW: 15 % (ref 11.5–15.5)
WBC: 15.1 10*3/uL — ABNORMAL HIGH (ref 4.0–10.5)

## 2017-01-02 NOTE — Progress Notes (Signed)
Patient resting with eyes closed upon rounding. Will continue to montior.

## 2017-01-02 NOTE — Progress Notes (Signed)
Recreational Therapy Session Note  Patient Details  Name: LAMIYAH SCHLOTTER MRN: [id number] Date of Birth: 08/19/1950 Today's Date: 01/02/2017  Pain: no c/o Skilled Therapeutic Interventions/Progress Updates:Session focused on discussing community reintegration/outing prior to discharge.  Discussion included purpose of the outing & potential goals.  Pt agreeable to participate in an outing to be scheduled on Monday.   Callista Hoh 01/02/2017, 10:26 AM

## 2017-01-02 NOTE — Progress Notes (Signed)
Physical Therapy Session Note  Patient Details  Name: Katrina [name] MRN: [id number] Date of Birth: 1951/01/11  Today's Date: 01/02/2017 PT Individual Time: 1015-1130 PT Individual Time Calculation (min): 75 min   Short Term Goals: Week 1:  PT Short Term Goal 1 (Week 1): Pt will demonstrate bed mobility consistent S PT Short Term Goal 2 (Week 1): Pt will demonstrate transfer w/c <>bed with consistent minA PT Short Term Goal 3 (Week 1): Pt will ambulate x50' minA PT Short Term Goal 4 (Week 1): Pt will demonstrate dynamic standing balance x5 min with minA  Skilled Therapeutic Interventions/Progress Updates: Pt received seated in w/c, denies pain and agreeable to treatment. W/c propulsion x150' with BUE to gym for strengthening and endurance. Gait x2 trials with litegait, RW and min guard x50' including turns and backwards stepping to w/c. Cues for wider BOS, reducing reliance on UEs, occasional short standing rest breaks d/t fatigue. L knee brace donned for increased proprioceptive input with pt reporting improved stability and confidence during stance. Stand pivot transfer w/c<>mat table with RW and min guard. Standing balance on small red wedge progressed to larger blue wedge with forefoot on wedge for focus on closed chain dorsiflexor activation for ankle/righting strategies. Occasional small posterior pertubation applied by therapist to facilitate righting. Occasional use of UEs on RW to recover balance. Static standing balance on leve surface no UE support x4 trials with max of 12 sec before LOB, improved from 2-3 sec initially. Attempted staggered stance LLE in front no UE support, unable to maintain >3-4 sec at a time. Sit <>supine minA on mat table. Attempted LLE clamshell and straight leg raise abduction; modA d/t ataxia and max cues for technique. Returned to w/c min guard stand pivot. W/c propulsion to return to room; remained seated in w/c, all needs in reach.      Therapy  Documentation Precautions:  Precautions Precautions: Fall Precaution Booklet Issued: Yes (comment) Precaution Comments: Able to state 3/3 back precautions; maintains precautions during mobility with min cues Restrictions Weight Bearing Restrictions: No   See Function Navigator for Current Functional Status.   Therapy/Group: Individual Therapy  Luberta Mutter 01/02/2017, 11:41 AM

## 2017-01-02 NOTE — Progress Notes (Signed)
Occupational Therapy Session Note  Patient Details  Name: Katrina [name] MRN: [id number] Date of Birth: [birth year]  Today's Date: 01/02/2017 OT Individual Time: 0900-1000 OT Individual Time Calculation (min): 60 min    Short Term Goals: Week 2:  OT Short Term Goal 1 (Week 2): STG=LTG secondary to ELOS  Skilled Therapeutic Interventions/Progress Updates:    Pt engaged in BADL retraining including bathing at shower level, toileting, and dressing with sit<>stand from w/c at sink.  Pt performs sit<>stand with steady A and requires min A/mod A for standing balance when pulling up pants.  Pt used AE appropriately for LB bathing/dressing tasks.  Pt continues to exhibit increased anxiety when standing and decreased UB body awareness and compensatory balance strategies.  Focus on functional transfers, sit<>stand, standing balance, and safety awareness to increase independence with BADLs.   Therapy Documentation Precautions:  Precautions Precautions: Fall Precaution Booklet Issued: Yes (comment) Precaution Comments: Able to state 3/3 back precautions; maintains precautions during mobility with min cues Restrictions Weight Bearing Restrictions: No  Pain: Pain Assessment Pain Assessment: 0-10 Pain Score: 5  Pain Type: Acute pain Pain Location: Back Pain Orientation: Left;Lower Pain Descriptors / Indicators: Aching Pain Frequency: Intermittent Pain Onset: On-going Patients Stated Pain Goal: 2 Pain Intervention(s): ;Repositioned Multiple Pain Sites: No  See Function Navigator for Current Functional Status.   Therapy/Group: Individual Therapy  Leroy Libman 01/02/2017, 10:54 AM

## 2017-01-02 NOTE — Progress Notes (Signed)
Occupational Therapy Weekly Progress Note  Patient Details  Name: Katrina [name] MRN: [id number] Date of Birth: [birth year]  Beginning of progress report period: December 26, 2016 End of progress report period: January 02, 2017  Patient has met 4 of 4 short term goals.  Pt made steady progress with BADLs since admission.  Pt completes all functional transfers with min A.  Pt requires steady A for bathing and LB dressing tasks (sit<>stand). Pt currently uses grab bars for toilet transfers and shower transfers but will need to progress to using RW for transfers when d/c home.  Family has not been present for therapy.  Patient continues to demonstrate the following deficits: muscle weakness and decr proception and sensation in LEs, decreased cardiorespiratoy endurance and decreased standing balance, decreased postural control and decreased balance strategies and therefore will continue to benefit from skilled OT intervention to enhance overall performance with BADL and Reduce care partner burden.  Patient progressing toward long term goals..  Continue plan of care. Pt will d/c to [facility] for continued treatment of tumors with chemo. Will plan to teach family min A thinking after chemo she may be weaker.  OT Short Term Goals Week 1:  OT Short Term Goal 1 (Week 1): Pt will transfer to BSC/ toilet with min A with LRAD OT Short Term Goal 1 - Progress (Week 1): Met OT Short Term Goal 2 (Week 1): Pt will don UB clothing mod I  OT Short Term Goal 2 - Progress (Week 1): Met OT Short Term Goal 3 (Week 1): Pt will dress LB with mod a including shoes OT Short Term Goal 3 - Progress (Week 1): Met OT Short Term Goal 4 (Week 1): Pt will transfer into tub shower with tub bench with min A.  OT Short Term Goal 4 - Progress (Week 1): Met Week 2:  OT Short Term Goal 1 (Week 2): STG=LTG secondary to ELOS   Therapy Documentation Precautions:  Precautions Precautions: Fall Precaution Booklet Issued: Yes  (comment) Precaution Comments: Able to state 3/3 back precautions; maintains precautions during mobility with min cues Restrictions Weight Bearing Restrictions: No  See Function Navigator for Current Functional Status.     [name] [facility] 01/02/2017, 6:55 AM

## 2017-01-02 NOTE — Progress Notes (Addendum)
Subjective/Complaints: Had another good night. Slept well. Feels that sensation is improving  ROS: pt denies nausea, vomiting, diarrhea, cough, shortness of breath or chest pain n   Objective: Vital Signs: Blood pressure (!) 150/75, pulse 79, temperature 97.8 F (36.6 C), temperature source Oral, resp. rate 16, weight 114 kg (251 lb 5.2 oz), SpO2 98 %. No results found. Results for orders placed or performed during the hospital encounter of 12/26/16 (from the past 72 hour(s))  CBC     Status: Abnormal   Collection Time: 12/30/16 10:34 AM  Result Value Ref Range   WBC 17.1 (H) 4.0 - 10.5 K/uL   RBC 4.48 3.87 - 5.11 MIL/uL   Hemoglobin 12.1 12.0 - 15.0 g/dL   HCT 36.7 36.0 - 46.0 %   MCV 81.9 78.0 - 100.0 fL   MCH 27.0 26.0 - 34.0 pg   MCHC 33.0 30.0 - 36.0 g/dL   RDW 15.1 11.5 - 15.5 %   Platelets 442 (H) 150 - 400 K/uL  CBC     Status: Abnormal   Collection Time: 01/02/17  6:08 AM  Result Value Ref Range   WBC 15.1 (H) 4.0 - 10.5 K/uL   RBC 4.15 3.87 - 5.11 MIL/uL   Hemoglobin 11.2 (L) 12.0 - 15.0 g/dL   HCT 34.0 (L) 36.0 - 46.0 %   MCV 81.9 78.0 - 100.0 fL   MCH 27.0 26.0 - 34.0 pg   MCHC 32.9 30.0 - 36.0 g/dL   RDW 15.0 11.5 - 15.5 %   Platelets 340 150 - 400 K/uL  Basic metabolic panel     Status: Abnormal   Collection Time: 01/02/17  6:08 AM  Result Value Ref Range   Sodium 131 (L) 135 - 145 mmol/L   Potassium 3.3 (L) 3.5 - 5.1 mmol/L   Chloride 95 (L) 101 - 111 mmol/L   CO2 27 22 - 32 mmol/L   Glucose, Bld 105 (H) 65 - 99 mg/dL   BUN 24 (H) 6 - 20 mg/dL   Creatinine, Ser 0.83 0.44 - 1.00 mg/dL   Calcium 8.9 8.9 - 10.3 mg/dL   GFR calc non Af Amer >60 >60 mL/min   GFR calc Af Amer >60 >60 mL/min    Comment: (NOTE) The eGFR has been calculated using the CKD EPI equation. This calculation has not been validated in all clinical situations. eGFR's persistently <60 mL/min signify possible Chronic Kidney Disease.    Anion gap 9 5 - 15     HEENT:  normal Cardio:RRR without murmur. No JVD  Resp: CTA Bilaterally without wheezes or rales. Normal effort  GI: BS +, non-tender, non-distended  Extremity:  Trace LE edema Skin:   Wound C/D/I with steristrips Neuro: Alert/Oriented, Abnormal Sensory Paresthesias below T8 however has intact light touch sensation, Abnormal Motor 5/5 bilateral deltoid, bicep, tricep, grip, 3 in the left hip flexor knee extensor ankle dorsiflexor 3 in the right hip flexor knee extensor ankle dorsiflexor and Reflexes: 0 at the ankles, 2-3 plus at the knees Absent proprioception in feet which improves gradually as you move proximally up the legs.  Musc/Skel:  Normal PROM and AROM Psych: pleasant   Assessment/Plan: 1. 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 Physiatrist is providing close team supervision and 24 hour management of active medical problems listed below. Physiatrist and rehab team continue to assess barriers to discharge/monitor patient progress toward  functional and medical goals. FIM: Function - Bathing Position: Shower Body parts bathed by patient: Right arm, Left arm, Chest, Right upper leg, Left upper leg, Abdomen, Front perineal area, Buttocks, Right lower leg, Left lower leg Body parts bathed by helper: Right lower leg, Left lower leg Bathing not applicable: Back Assist Level: Touching or steadying assistance(Pt > 75%), Assistive device Assistive Device Comment: LH sponge  Function- Upper Body Dressing/Undressing What is the patient wearing?: Bra, Pull over shirt/dress Bra - Perfomed by patient: Thread/unthread left bra strap, Thread/unthread right bra strap, Hook/unhook bra (pull down sports bra) Pull over shirt/dress - Perfomed by patient: Thread/unthread right sleeve, Thread/unthread left sleeve, Put head through opening, Pull shirt over trunk Pull  over shirt/dress - Perfomed by helper: Pull shirt over trunk Assist Level: Set up Set up : To obtain clothing/put away Function - Lower Body Dressing/Undressing What is the patient wearing?: Pants, Non-skid slipper socks Position: Wheelchair/chair at sink Pants- Performed by patient: Thread/unthread right pants leg, Thread/unthread left pants leg, Pull pants up/down Pants- Performed by helper: Thread/unthread left pants leg Non-skid slipper socks- Performed by patient: Don/doff right sock, Don/doff left sock Non-skid slipper socks- Performed by helper: Don/doff right sock, Don/doff left sock Assist for footwear: Supervision/touching assist Assist for lower body dressing: Touching or steadying assistance (Pt > 75%) Assistive Device Comment: reacher  Function - Toileting Toileting steps completed by patient: Adjust clothing prior to toileting, Performs perineal hygiene, Adjust clothing after toileting Toileting steps completed by helper: Adjust clothing prior to toileting, Adjust clothing after toileting Toileting Assistive Devices: Grab bar or rail Assist level: Touching or steadying assistance (Pt.75%)  Function - Air cabin crew transfer assistive device: Elevated toilet seat/BSC over toilet, Grab bar Assist level to toilet: Touching or steadying assistance (Pt > 75%) Assist level from toilet: Touching or steadying assistance (Pt > 75%) Assist level to bedside commode (at bedside): Touching or steadying assistance (Pt > 75%) Assist level from bedside commode (at bedside): Touching or steadying assistance (Pt > 75%)  Function - Chair/bed transfer Chair/bed transfer method: Stand pivot Chair/bed transfer assist level: Touching or steadying assistance (Pt > 75%) Chair/bed transfer assistive device: Walker, Armrests Chair/bed transfer details: Verbal cues for precautions/safety, Verbal cues for safe use of DME/AE, Verbal cues for sequencing  Function - Locomotion:  Wheelchair Will patient use wheelchair at discharge?: Yes Type: Manual Max wheelchair distance: 160 Assist Level: Supervision or verbal cues Assist Level: Supervision or verbal cues Wheel 150 feet activity did not occur: Safety/medical concerns(fatigue) Assist Level: Supervision or verbal cues Function - Locomotion: Ambulation Assistive device: Walker-rolling, Lite gait Max distance: 50 Assist level: Touching or steadying assistance (Pt > 75%) Assist level: Touching or steadying assistance (Pt > 75%) Walk 50 feet with 2 turns activity did not occur: Safety/medical concerns Walk 150 feet activity did not occur: Safety/medical concerns Walk 10 feet on uneven surfaces activity did not occur: Safety/medical concerns  Function - Comprehension Comprehension: Auditory Comprehension assist level: Follows complex conversation/direction with no assist  Function - Expression Expression: Verbal Expression assist level: Expresses complex ideas: With no assist  Function - Social Interaction Social Interaction assist level: Interacts appropriately with others - No medications needed.  Function - Problem Solving Problem solving assist level: Solves complex problems: Recognizes & self-corrects  Function - Memory Memory assist level: Complete Independence: No helper Patient normally able to recall (first 3 days only): Current season, Location of own room, Staff names and faces, That he or she is in a hospital  Medical Problem List and Plan: 1. 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 Remains motivated  2. DVT Prophylaxis/Anticoagulation: Pharmaceutical:Lovenoxadded as > week post op. Dopplers negative  3. Pain Management:oxycodone prn effective, continue 4. Mood:LCSW to follow for evaluation and support. Reports that she is managing to deal with diagnosis 5. Neuropsych: This patientiscapable of making  decisions on herown behalf. 6. Skin/Wound Care:routine pressure relief measures 7. Fluids/Electrolytes/Nutrition:Monitor I/O.  , encourage fluids  -I personally reviewed the patient's labs today.   8.HTN: Monitor BP bid. Continue Norvasc and dyazide.  BP under fair control generally---continuie to follow Vitals:   01/01/17 2035 01/02/17 0251  BP: 137/70 (!) 150/75  Pulse: 65 79  Resp:  16  Temp:  97.8 F (36.6 C)  SpO2: 95% 98%   9. ABLA: on iron. Repeat hgb 11/9 is stable' 10. Leucocytosis: Likely reactive due to steroids. Monitor for signs of infection.   -wbc's 15.1 today (stable) 11. AKI: Due to dehydration . Encourage fluid intake.   12. Hyponatremia: holdin at 131  -continue to follow  LOS (Days) 7 A FACE TO FACE EVALUATION WAS PERFORMED  Ashanta Amoroso T 01/02/2017, 8:46 AM

## 2017-01-02 NOTE — Progress Notes (Signed)
Physical Therapy Session Note  Patient Details  Name: Katrina [name] MRN: [id number] Date of Birth: 12-16-1950  Today's Date: 01/02/2017 PT Individual Time: 2876-8115 PT Individual Time Calculation (min): 72 min   Short Term Goals: Week 1:  PT Short Term Goal 1 (Week 1): Pt will demonstrate bed mobility consistent S PT Short Term Goal 2 (Week 1): Pt will demonstrate transfer w/c <>bed with consistent minA PT Short Term Goal 3 (Week 1): Pt will ambulate x50' minA PT Short Term Goal 4 (Week 1): Pt will demonstrate dynamic standing balance x5 min with minA  Skilled Therapeutic Interventions/Progress Updates:    Min assist transfers with RW with cues for hand placement and attention to L foot placement throughout session. On mat focused on NMR for BLE motor control and muscle activation to repeat clamshell (per pt request) instructed in this morning x 10 reps x 2 seats each BLE with cues for slowed movement to focus on control and supine hooklying position with adduction squeezes x 10 sec hold x 10 reps x 2 sets with pelvic tilt. Pt performed bed mobility with supervision to steadying assist for control of LLE while maintaining back precautions. Seated EOM instructed in pelvic tilt exercises x 10 reps with cues for abdominal activation. NMR in standing for targeted toe taps with 3 X each side x 2 sets with rest break between sets with RW for support; Kinetron in seated and progressed to standing on resistance of 30 cm/sec to focus on reciprocal movement pattern retraining and coordination. Completed toilet transfers and dynamic standing balance for hygiene and clothing management with overall min assist. L knee brace donned for all standing activities this session. W/c propulsion with BUE for functional strengthening and endurance  Therapy Documentation Precautions:  Precautions Precautions: Fall Precaution Booklet Issued: Yes (comment) Precaution Comments: Able to state 3/3 back precautions;  maintains precautions during mobility with min cues Restrictions Weight Bearing Restrictions: No  Pain:  Reports mild back pain - premedicated.   See Function Navigator for Current Functional Status.   Therapy/Group: Individual Therapy  Canary Brim Ivory Broad, PT, DPT  01/02/2017, 3:20 PM

## 2017-01-03 ENCOUNTER — Inpatient Hospital Stay (HOSPITAL_COMMUNITY): Payer: Medicare Other | Admitting: Physical Therapy

## 2017-01-03 ENCOUNTER — Inpatient Hospital Stay (HOSPITAL_COMMUNITY): Payer: Medicare Other

## 2017-01-03 NOTE — Progress Notes (Signed)
Occupational Therapy Note  Patient Details  Name: [name] MRN: [id number] Date of Birth: Apr 26, 1950  Today's Date: 01/03/2017 OT Individual Time: 1300-1345 OT Individual Time Calculation (min): 45 min   Pt denies pain Individual Therapy  Focus on stand pivot transfers to tub bench.  Pt performed task X 2 with RW requiring min A/steady A with min verbal cues for sequencing and safety awareness.  Pt donned L knee brace prior to transfer.  Pt will have to walk approx 5' from door of bathroom to tub bench.  Will continue to address. Pt issued pair of mobility gloves.  Pt propelled w/c from room<>tub room.  Pt remained in w/c with all needs within reach.    [name] [facility] 01/03/2017, 2:06 PM

## 2017-01-03 NOTE — Progress Notes (Signed)
Physical Therapy Session Note  Patient Details  Name: Katrina [name] MRN: [id number] Date of Birth: [birth year]  Today's Date: 01/03/2017 PT Individual Time: 1350-1430 PT Individual Time Calculation (min): 40 min   Short Term Goals: Week 1:  PT Short Term Goal 1 (Week 1): Pt will demonstrate bed mobility consistent S PT Short Term Goal 1 - Progress (Week 1): Met PT Short Term Goal 2 (Week 1): Pt will demonstrate transfer w/c <>bed with consistent minA PT Short Term Goal 2 - Progress (Week 1): Met PT Short Term Goal 3 (Week 1): Pt will ambulate x50' minA PT Short Term Goal 3 - Progress (Week 1): Not met PT Short Term Goal 4 (Week 1): Pt will demonstrate dynamic standing balance x5 min with minA PT Short Term Goal 4 - Progress (Week 1): Not met  Skilled Therapeutic Interventions/Progress Updates:   Pt received sitting in WC and agreeable to PT  WC mobility in simulated community environment of hospital gift shop as well as hospital lobby [id number], [id number] and [id number] Min cues for problem solving to maneuver WC through tight spaces and adjusting to furniture out of place.   Gait training with RW 2x 25 ft with min assist overall with mod assist x 1 to prevent L lateral LOB. Cues for step length and increased gait speed to normalize gait pattern.    Patient returned to room and [facility] reach and all needs met.       Therapy Documentation Precautions:  Precautions Precautions: Fall Precaution Booklet Issued: Yes (comment) Precaution Comments: Able to state 3/3 back precautions; maintains precautions during mobility with min cues Restrictions Weight Bearing Restrictions: No Pain:0/10   See Function Navigator for Current Functional Status.   Therapy/Group: Individual Therapy  AusLorie Phenix/16/2018, 2:33 PM

## 2017-01-03 NOTE — Progress Notes (Signed)
Physical Therapy Weekly Progress Note  Patient Details  Name: Katrina [name] MRN: [id number] Date of Birth: 11-May-1950  Beginning of progress report period: December 27, 2016 End of progress report period: January 03, 2017  Today's Date: 01/03/2017 PT Individual Time: 0900-1000 PT Individual Time Calculation (min): 60 min   Patient has met 2 of 4 short term goals.  Pt currently requires S bed mobility, min guard for sit <>stand, minA stand pivot transfers. Requires +2 for gait d/t instability and occasional buckling, however ambulating distances 10-30' before fatigued. Limited by significantly impaired proprioception in bilateral lower extremities L>R. Pt also has significant anxiety with mobility tasks due to fear of falling, however improving confidence and awareness of LE placement with therapeutic intervention.  Patient continues to demonstrate the following deficits muscle weakness, decreased cardiorespiratoy endurance, impaired timing and sequencing, unbalanced muscle activation, ataxia and decreased coordination and decreased sitting balance, decreased standing balance, decreased postural control, decreased balance strategies and difficulty maintaining precautions and therefore will continue to benefit from skilled PT intervention to increase functional independence with mobility.  Patient progressing toward long term goals..  Plan of care revisions: ambulation goal downgraded to minA 30' in home environment d/t slow progress and ataxia..  PT Short Term Goals Week 1:  PT Short Term Goal 1 (Week 1): Pt will demonstrate bed mobility consistent S PT Short Term Goal 1 - Progress (Week 1): Met PT Short Term Goal 2 (Week 1): Pt will demonstrate transfer w/c <>bed with consistent minA PT Short Term Goal 2 - Progress (Week 1): Met PT Short Term Goal 3 (Week 1): Pt will ambulate x50' minA PT Short Term Goal 3 - Progress (Week 1): Not met PT Short Term Goal 4 (Week 1): Pt will demonstrate  dynamic standing balance x5 min with minA PT Short Term Goal 4 - Progress (Week 1): Not met Week 2:  PT Short Term Goal 1 (Week 2): =LTG due to estimated LOS  Skilled Therapeutic Interventions/Progress Updates: Pt received seated in w/c, denies pain and agreeable to treatment. W/c propulsion to/from gym with BUE for strengthening and endurance. Gait in parallel bars x30' with minA and slow speed. Gait with RW x3 trials, x13', x23' x30' with min guard/minA, RW and w/c follow for safety. Standing balance with RW and min guard while performing alternating LE soccer ball kicks; occasional minor LOBs regained with minA, and short standing rest breaks d/t fatigue and sensation of unsteadiness. Ambulatory transfer x5' with RW and min guard to return to w/c. Gait into bathroom x5' with RW and min guard. Pt doffs pants in sitting with lateral leans. Remained seated on toilet at end of session with instruction to use call bell when finished; pt agreeable.      Therapy Documentation Precautions:  Precautions Precautions: Fall Precaution Booklet Issued: Yes (comment) Precaution Comments: Able to state 3/3 back precautions; maintains precautions during mobility with min cues Restrictions Weight Bearing Restrictions: No  See Function Navigator for Current Functional Status.  Therapy/Group: Individual Therapy  Luberta Mutter 01/03/2017, 10:00 AM

## 2017-01-03 NOTE — Progress Notes (Signed)
Occupational Therapy Session Note  Patient Details  Name: Katrina [name] MRN: [id number] Date of Birth: 08-Jul-1950  Today's Date: 01/03/2017 OT Individual Time: 0768-0881 OT Individual Time Calculation (min): 55 min    Short Term Goals: Week 2:  OT Short Term Goal 1 (Week 2): STG=LTG secondary to ELOS  Skilled Therapeutic Interventions/Progress Updates:    Focus on continued BADL retraining, sitting balance, sit<>stand, functional transfers, standing balance, and safety awareness.  Continued discussion regarding discharge planning and home setup.  Pt performed stand pivot transfers w/c<>BSC over toilet and w/c<>tub bench in shower, requiring min A.Pt completed bathing tasks at supervision with lateral leans to bathe buttocks.  Pt completed dressing tasks with sit<>stand from w/c to pull up pants. Pt uses AE appropriately for LB bathing/dressing tasks.  Pt remained in w/c with all needs within reach.   Therapy Documentation Precautions:  Precautions Precautions: Fall Precaution Booklet Issued: Yes (comment) Precaution Comments: Able to state 3/3 back precautions; maintains precautions during mobility with min cues Restrictions Weight Bearing Restrictions: No Pain:  Pt denies pain  See Function Navigator for Current Functional Status.   Therapy/Group: Individual Therapy  Leroy Libman 01/03/2017, 8:57 AM

## 2017-01-03 NOTE — Progress Notes (Signed)
Subjective/Complaints: No issues overnight.  Patient denies pain.  Emptying bowels and bladder  ROS: pt denies nausea, vomiting, diarrhea, cough, shortness of breath or chest pain  Objective: Vital Signs: Blood pressure (!) 158/84, pulse 67, temperature 97.8 F (36.6 C), temperature source Oral, resp. rate 20, weight 114 kg (251 lb 5.2 oz), SpO2 100 %. No results found. Results for orders placed or performed during the hospital encounter of 12/26/16 (from the past 72 hour(s))  CBC     Status: Abnormal   Collection Time: 01/02/17  6:08 AM  Result Value Ref Range   WBC 15.1 (H) 4.0 - 10.5 K/uL   RBC 4.15 3.87 - 5.11 MIL/uL   Hemoglobin 11.2 (L) 12.0 - 15.0 g/dL   HCT 34.0 (L) 36.0 - 46.0 %   MCV 81.9 78.0 - 100.0 fL   MCH 27.0 26.0 - 34.0 pg   MCHC 32.9 30.0 - 36.0 g/dL   RDW 15.0 11.5 - 15.5 %   Platelets 340 150 - 400 K/uL  Basic metabolic panel     Status: Abnormal   Collection Time: 01/02/17  6:08 AM  Result Value Ref Range   Sodium 131 (L) 135 - 145 mmol/L   Potassium 3.3 (L) 3.5 - 5.1 mmol/L   Chloride 95 (L) 101 - 111 mmol/L   CO2 27 22 - 32 mmol/L   Glucose, Bld 105 (H) 65 - 99 mg/dL   BUN 24 (H) 6 - 20 mg/dL   Creatinine, Ser 0.83 0.44 - 1.00 mg/dL   Calcium 8.9 8.9 - 10.3 mg/dL   GFR calc non Af Amer >60 >60 mL/min   GFR calc Af Amer >60 >60 mL/min    Comment: (NOTE) The eGFR has been calculated using the CKD EPI equation. This calculation has not been validated in all clinical situations. eGFR's persistently <60 mL/min signify possible Chronic Kidney Disease.    Anion gap 9 5 - 15     HEENT: normal Cardio:RRR without murmur. No JVD   Resp: CTA Bilaterally without wheezes or rales. Normal effort  GI: BS +, non-tender, non-distended  Extremity:   Trace edema in both lower extremities Skin:   Wound C/D/I with steristrips Neuro: Alert/Oriented, patient with decreased sensation below T8 but is able to discern  light touch, Abnormal Motor 5/5 bilateral deltoid,  bicep, tricep, grip, 3 in the left hip flexor knee extensor ankle dorsiflexor 3 in the right hip flexor knee extensor ankle dorsiflexor and Reflexes: 0 at the ankles, 2-3 plus at the knees Absent proprioception in feet which improves gradually as you move proximally up the legs.  Musc/Skel:  Normal PROM and AROM Psych: pleasant   Assessment/Plan: 1. 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 Physiatrist is providing close team supervision and 24 hour management of active medical problems listed below. Physiatrist and rehab team continue to assess barriers to discharge/monitor patient progress toward functional and medical goals. FIM: Function - Bathing Position: Shower Body parts bathed by patient: Right arm, Left arm, Chest, Right upper leg, Left upper leg, Abdomen, Front perineal area, Buttocks, Right lower leg, Left lower leg Body parts bathed by helper: Right lower leg, Left lower leg Bathing not applicable: Back Assist Level: Supervision or verbal cues Assistive Device Comment: LH sponge  Function- Upper Body Dressing/Undressing What is the patient wearing?: Bra, Pull over shirt/dress Bra - Perfomed by patient: Thread/unthread left bra strap, Thread/unthread  right bra strap, Hook/unhook bra (pull down sports bra) Pull over shirt/dress - Perfomed by patient: Thread/unthread right sleeve, Thread/unthread left sleeve, Put head through opening, Pull shirt over trunk Pull over shirt/dress - Perfomed by helper: Pull shirt over trunk Assist Level: More than reasonable time Set up : To obtain clothing/put away Function - Lower Body Dressing/Undressing What is the patient wearing?: Pants, Non-skid slipper socks Position: Wheelchair/chair at sink Pants- Performed by patient: Thread/unthread right pants leg, Thread/unthread left pants leg, Pull pants  up/down Pants- Performed by helper: Thread/unthread left pants leg Non-skid slipper socks- Performed by patient: Don/doff right sock, Don/doff left sock Non-skid slipper socks- Performed by helper: Don/doff right sock, Don/doff left sock Assist for footwear: Supervision/touching assist Assist for lower body dressing: Touching or steadying assistance (Pt > 75%) Assistive Device Comment: reacher  Function - Toileting Toileting steps completed by patient: Adjust clothing prior to toileting, Performs perineal hygiene, Adjust clothing after toileting Toileting steps completed by helper: Adjust clothing prior to toileting, Adjust clothing after toileting Toileting Assistive Devices: Grab bar or rail Assist level: Touching or steadying assistance (Pt.75%)  Function - Air cabin crew transfer assistive device: Elevated toilet seat/BSC over toilet, Grab bar Assist level to toilet: Touching or steadying assistance (Pt > 75%) Assist level from toilet: Touching or steadying assistance (Pt > 75%) Assist level to bedside commode (at bedside): Touching or steadying assistance (Pt > 75%) Assist level from bedside commode (at bedside): Touching or steadying assistance (Pt > 75%)  Function - Chair/bed transfer Chair/bed transfer method: Stand pivot Chair/bed transfer assist level: Touching or steadying assistance (Pt > 75%) Chair/bed transfer assistive device: Armrests, Walker Chair/bed transfer details: Verbal cues for precautions/safety, Verbal cues for safe use of DME/AE, Verbal cues for sequencing  Function - Locomotion: Wheelchair Will patient use wheelchair at discharge?: Yes Type: Manual Max wheelchair distance: 160 Assist Level: Supervision or verbal cues Assist Level: Supervision or verbal cues Wheel 150 feet activity did not occur: Safety/medical concerns(fatigue) Assist Level: Supervision or verbal cues Turns around,maneuvers to table,bed, and toilet,negotiates 3% grade,maneuvers  on rugs and over doorsills: Yes Function - Locomotion: Ambulation Assistive device: Walker-rolling, Lite gait Max distance: 50 Assist level: Touching or steadying assistance (Pt > 75%) Assist level: Touching or steadying assistance (Pt > 75%) Walk 50 feet with 2 turns activity did not occur: Safety/medical concerns Assist level: Touching or steadying assistance (Pt > 75%) Walk 150 feet activity did not occur: Safety/medical concerns Walk 10 feet on uneven surfaces activity did not occur: Safety/medical concerns  Function - Comprehension Comprehension: Auditory Comprehension assist level: Follows complex conversation/direction with extra time/assistive device  Function - Expression Expression: Verbal Expression assist level: Expresses complex ideas: With extra time/assistive device  Function - Social Interaction Social Interaction assist level: Interacts appropriately with others with medication or extra time (anti-anxiety, antidepressant).  Function - Problem Solving Problem solving assist level: Solves complex 90% of the time/cues < 10% of the time  Function - Memory Memory assist level: Complete Independence: No helper Patient normally able to recall (first 3 days only): Current season, Location of own room, Staff names and faces, That he or she is in a hospital   Medical Problem List and Plan: 1. 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 DVT Prophylaxis/Anticoagulation: Pharmaceutical:Lovenoxadded as > week post op. Dopplers negative  3. Pain Management:oxycodone prn effective, continue 4. Mood:LCSW to follow for evaluation and support. Reports that she is managing to  deal with diagnosis 5. Neuropsych: This patientiscapable of making decisions on herown behalf. 6. Skin/Wound Care:routine pressure relief measures 7. Fluids/Electrolytes/Nutrition:Monitor I/O.  , encourage fluids  -I  personally reviewed the patient's labs today.   8.HTN: Monitor BP bid. Continue Norvasc and dyazide.    -Blood pressure borderline to normal.  Observe only for now Vitals:   01/02/17 2024 01/03/17 0300  BP: (!) 162/82 (!) 158/84  Pulse: 76 67  Resp: 18 20  Temp: 97.9 F (36.6 C) 97.8 F (36.6 C)  SpO2: 96% 100%   9. ABLA: on iron. Repeat hgb 11/9 is stable' 10. Leucocytosis: Likely reactive due to steroids. Monitor for signs of infection.   -wbc's 15.1  On 11/15  -Recheck Monday 11. AKI: Due to dehydration . Encourage fluid intake.   12. Hyponatremia: holding at 131  -continue to follow  -Recheck Monday  LOS (Days) 8 A FACE TO FACE EVALUATION WAS PERFORMED  Ramzi Brathwaite T 01/03/2017, 8:53 AM

## 2017-01-04 ENCOUNTER — Inpatient Hospital Stay (HOSPITAL_COMMUNITY): Payer: Medicare Other | Admitting: Occupational Therapy

## 2017-01-04 DIAGNOSIS — D62 Acute posthemorrhagic anemia: Secondary | ICD-10-CM

## 2017-01-04 DIAGNOSIS — E871 Hypo-osmolality and hyponatremia: Secondary | ICD-10-CM

## 2017-01-04 DIAGNOSIS — I10 Essential (primary) hypertension: Secondary | ICD-10-CM

## 2017-01-04 MED ORDER — LISINOPRIL 2.5 MG PO TABS
2.5000 mg | ORAL_TABLET | Freq: Every day | ORAL | Status: DC
  Administered 2017-01-04 – 2017-01-07 (×4): 2.5 mg via ORAL
  Filled 2017-01-04 (×4): qty 1

## 2017-01-04 NOTE — Progress Notes (Signed)
Subjective/Complaints: Patient seen lying in bed this morning.  She states she slept well overnight.  She is feels like she is getting much better.  ROS: Denies nausea, vomiting, diarrhea, shortness of breath or chest pain   Objective: Vital Signs: Blood pressure (!) 148/71, pulse 67, temperature 97.7 F (36.5 C), temperature source Oral, resp. rate 17, weight 114 kg (251 lb 5.2 oz), SpO2 98 %. No results found. Results for orders placed or performed during the hospital encounter of 12/26/16 (from the past 72 hour(s))  CBC     Status: Abnormal   Collection Time: 01/02/17  6:08 AM  Result Value Ref Range   WBC 15.1 (H) 4.0 - 10.5 K/uL   RBC 4.15 3.87 - 5.11 MIL/uL   Hemoglobin 11.2 (L) 12.0 - 15.0 g/dL   HCT 34.0 (L) 36.0 - 46.0 %   MCV 81.9 78.0 - 100.0 fL   MCH 27.0 26.0 - 34.0 pg   MCHC 32.9 30.0 - 36.0 g/dL   RDW 15.0 11.5 - 15.5 %   Platelets 340 150 - 400 K/uL  Basic metabolic panel     Status: Abnormal   Collection Time: 01/02/17  6:08 AM  Result Value Ref Range   Sodium 131 (L) 135 - 145 mmol/L   Potassium 3.3 (L) 3.5 - 5.1 mmol/L   Chloride 95 (L) 101 - 111 mmol/L   CO2 27 22 - 32 mmol/L   Glucose, Bld 105 (H) 65 - 99 mg/dL   BUN 24 (H) 6 - 20 mg/dL   Creatinine, Ser 0.83 0.44 - 1.00 mg/dL   Calcium 8.9 8.9 - 10.3 mg/dL   GFR calc non Af Amer >60 >60 mL/min   GFR calc Af Amer >60 >60 mL/min    Comment: (NOTE) The eGFR has been calculated using the CKD EPI equation. This calculation has not been validated in all clinical situations. eGFR's persistently <60 mL/min signify possible Chronic Kidney Disease.    Anion gap 9 5 - 15     HEENT: Normocephalic, atraumatic. Cardio: RRR. No JVD   Resp: CTA Bilaterally.  Normal effort  GI: BS +, non-distended  Skin:   Wound C/D/I  Neuro: Alert/Oriented Motor 5/5 bilateral deltoid, bicep, tricep, grip 4+/5 in b/l hip flexor, knee extensors, ankle dorsi/plantar flexors Musc/Skel: No edema.  No tenderness. Psych:  pleasant.  Normal mood and behavior.   Assessment/Plan: 1. 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 Physiatrist is providing close team supervision and 24 hour management of active medical problems listed below. Physiatrist and rehab team continue to assess barriers to discharge/monitor patient progress toward functional and medical goals. FIM: Function - Bathing Position: Shower Body parts bathed by patient: Right arm, Left arm, Chest, Right upper leg, Left upper leg, Abdomen, Front perineal area, Buttocks, Right lower leg, Left lower leg Body parts bathed by helper: Right lower leg, Left lower leg Bathing not applicable: Back Assist Level: Supervision or verbal cues Assistive Device Comment: LH sponge  Function- Upper Body Dressing/Undressing What is the patient wearing?: Bra, Pull over shirt/dress Bra - Perfomed by patient: Thread/unthread left bra strap, Thread/unthread right bra strap, Hook/unhook bra (pull down sports bra) Pull over shirt/dress - Perfomed by patient: Thread/unthread right sleeve, Thread/unthread left sleeve, Put head through opening, Pull shirt over trunk Pull over shirt/dress - Perfomed by helper: Pull shirt over trunk Assist Level: More than reasonable time Set up :  To obtain clothing/put away Function - Lower Body Dressing/Undressing What is the patient wearing?: Pants, Non-skid slipper socks Position: Wheelchair/chair at sink Pants- Performed by patient: Thread/unthread right pants leg, Thread/unthread left pants leg, Pull pants up/down Pants- Performed by helper: Thread/unthread left pants leg Non-skid slipper socks- Performed by patient: Don/doff right sock, Don/doff left sock Non-skid slipper socks- Performed by helper: Don/doff right sock, Don/doff left sock Assist for footwear: Supervision/touching assist Assist  for lower body dressing: Supervision or verbal cues Assistive Device Comment: reacher, sock aide  Function - Toileting Toileting steps completed by patient: Adjust clothing prior to toileting, Performs perineal hygiene, Adjust clothing after toileting Toileting steps completed by helper: Adjust clothing prior to toileting, Adjust clothing after toileting Toileting Assistive Devices: Grab bar or rail Assist level: Touching or steadying assistance (Pt.75%)  Function - Air cabin crew transfer assistive device: Elevated toilet seat/BSC over toilet Assist level to toilet: Supervision or verbal cues Assist level from toilet: Supervision or verbal cues Assist level to bedside commode (at bedside): Touching or steadying assistance (Pt > 75%) Assist level from bedside commode (at bedside): Touching or steadying assistance (Pt > 75%)  Function - Chair/bed transfer Chair/bed transfer method: Stand pivot Chair/bed transfer assist level: Touching or steadying assistance (Pt > 75%) Chair/bed transfer assistive device: Armrests, Walker Chair/bed transfer details: Verbal cues for precautions/safety, Verbal cues for safe use of DME/AE, Verbal cues for sequencing  Function - Locomotion: Wheelchair Will patient use wheelchair at discharge?: Yes Type: Manual Max wheelchair distance: 160 Assist Level: Supervision or verbal cues Assist Level: Supervision or verbal cues Wheel 150 feet activity did not occur: Safety/medical concerns(fatigue) Assist Level: Supervision or verbal cues Turns around,maneuvers to table,bed, and toilet,negotiates 3% grade,maneuvers on rugs and over doorsills: Yes Function - Locomotion: Ambulation Assistive device: Walker-rolling, Other (comment)(L knee brace) Max distance: 30 Assist level: 2 helpers(minA, w/c follow) Assist level: 2 helpers Walk 50 feet with 2 turns activity did not occur: Safety/medical concerns Assist level: Touching or steadying assistance (Pt >  75%) Walk 150 feet activity did not occur: Safety/medical concerns Walk 10 feet on uneven surfaces activity did not occur: Safety/medical concerns  Function - Comprehension Comprehension: Auditory Comprehension assist level: Follows complex conversation/direction with extra time/assistive device  Function - Expression Expression: Verbal Expression assist level: Expresses complex ideas: With extra time/assistive device  Function - Social Interaction Social Interaction assist level: Interacts appropriately with others with medication or extra time (anti-anxiety, antidepressant).  Function - Problem Solving Problem solving assist level: Solves complex 90% of the time/cues < 10% of the time  Function - Memory Memory assist level: Complete Independence: No helper Patient normally able to recall (first 3 days only): Current season, Location of own room, Staff names and faces, That he or she is in a hospital   Medical Problem List and Plan: 1. 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 DVT Prophylaxis/Anticoagulation: Pharmaceutical:Lovenoxadded. Dopplers negative  3. Pain Management:oxycodone prn effective, continue 4. Mood:LCSW to follow for evaluation and support. Reports that she is managing to deal with diagnosis 5. Neuropsych: This patientiscapable of making decisions on herown behalf. 6. Skin/Wound Care:routine pressure relief measures 7. Fluids/Electrolytes/Nutrition:Monitor I/O.    Encourage fluid 8.HTN: Monitor BP bid. Continue Norvasc and dyazide.    -Blood pressure trending up.  Lisinopril 2.5 mg started on 11/17 Vitals:   01/03/17 1720 01/04/17 0257  BP: (!) 150/79 (!) 148/71  Pulse:  67  Resp:  17  Temp:  97.7 F (36.5 C)  SpO2:  98%   9. ABLA: on iron.   Hemoglobin 11.2 on 11/15  Continue to monitor 10. Leucocytosis: Likely reactive due to steroids. Monitor for signs of infection.    Afebrile  -wbc's 15.1  On 11/15  -Recheck Monday 11. AKI: Due to dehydration . Encourage fluid intake.   12. Hyponatremia:   131 on 11/15  -continue to follow  -Recheck Monday  LOS (Days) 9 A FACE TO FACE EVALUATION WAS PERFORMED  Ankit Lorie Phenix 01/04/2017, 2:35 PM

## 2017-01-04 NOTE — Progress Notes (Signed)
Occupational Therapy Session Note  Patient Details  Name: Katrina [name] MRN: [id number] Date of Birth: September 29, 1950  Today's Date: 01/04/2017 OT Individual Time: 1100-1200 OT Individual Time Calculation (min): 60 min    Short Term Goals: Week 2:  OT Short Term Goal 1 (Week 2): STG=LTG secondary to ELOS  Skilled Therapeutic Interventions/Progress Updates:    Pt seated in w/c upon OT arrival and requested to shower in preparation for her family visiting later today.  Pt gathered supplies from w/c level and OT assisted pt in navigating over threshold and positioning w/c next to TTB.  Pt performed SPT with close SBA and use of grab bars.  Pt bathed/dried self with distant S for safety and completed SPT back to w/c with SBA.  Pt dressed self with use of AE as appropriate and close SBA while standing with RW to don pants over hips.  Pt completed grooming from w/c level I'ly.  Reviewed home safety recommendations, including fall prevention, as well as adaptive techniques to perform cooking tasks from w/c level with occasional standing.  Pt demonstrated understanding in kitchen environment of all recommendations and utilized reacher appropriately to ensure compliance with spinal precautions.  Pt remained seated in w/c in room with all needs in reach and met upon OT departure.  Therapy Documentation Precautions:  Precautions Precautions: Fall Precaution Booklet Issued: Yes (comment) Precaution Comments: Able to state 3/3 back precautions; maintains precautions during mobility with min cues Restrictions Weight Bearing Restrictions: No Pain: Pain Assessment Pain Assessment: 0-10 Pain Score: 0-No pain Pain Type: Chronic pain Pain Location: Back Pain Descriptors / Indicators: Aching Pain Onset: On-going Pain Intervention(s): Medication (See eMAR) ADL: ADL ADL Comments: see functional navigator  See Function Navigator for Current Functional Status.   Therapy/Group: Individual  Therapy  Marcella Dubs 01/04/2017, 11:32 AM

## 2017-01-04 NOTE — Plan of Care (Signed)
  RH PAIN MANAGEMENT RH STG PAIN MANAGED AT OR BELOW PT'S PAIN GOAL 01/04/2017 0437 - Progressing by Cornell Barman, RN

## 2017-01-04 NOTE — Progress Notes (Signed)
PRN ultram and trazodone given at HS per patient's request. Cornell Barman

## 2017-01-04 NOTE — Plan of Care (Signed)
  Not Progressing SCI BOWEL ELIMINATION RH STG MANAGE BOWEL WITH ASSISTANCE Description STG Manage Bowel with [place].  01/04/2017 1418 - Not Progressing by Hillery Jacks, RN  LBM 11/14- Will give Miralax this afternoon if no BM RH STG SCI MANAGE BOWEL WITH MEDICATION WITH ASSISTANCE Description STG SCI Manage bowel with medication with min  assistance.  01/04/2017 1418 - Not Progressing by Hillery Jacks, RN  LBM 11/14- Will given miralax this afternoon if no BM

## 2017-01-05 DIAGNOSIS — N179 Acute kidney failure, unspecified: Secondary | ICD-10-CM

## 2017-01-05 NOTE — Progress Notes (Signed)
Subjective/Complaints: Patient seen lying in bed this morning. She states she slept well overnight. She denies complaints. Denies  ROS: Denies nausea, vomiting, diarrhea, shortness of breath or chest pain   Objective: Vital Signs: Blood pressure 121/64, pulse 60, temperature 97.6 F (36.4 C), temperature source Oral, resp. rate 16, weight 114 kg (251 lb 5.2 oz), SpO2 96 %. No results found. No results found for this or any previous visit (from the past 72 hour(s)).   HEENT: Normocephalic, atraumatic. Cardio: RRR. No JVD   Resp: CTA Bilaterally.  Normal effort. GI: BS +, non-distended  Skin:   Wound C/D/I  Neuro: Alert/Oriented Motor 5/5 bilateral deltoid, bicep, tricep, grip 4+/5 in b/l hip flexor, knee extensors, ankle dorsi/plantar flexors (stable) Musc/Skel: No edema.  No tenderness. Psych: pleasant.  Normal mood and behavior.   Assessment/Plan: 1. 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 Physiatrist is providing close team supervision and 24 hour management of active medical problems listed below. Physiatrist and rehab team continue to assess barriers to discharge/monitor patient progress toward functional and medical goals. FIM: Function - Bathing Position: Shower Body parts bathed by patient: Right arm, Left arm, Chest, Right upper leg, Left upper leg, Abdomen, Front perineal area, Buttocks, Right lower leg, Left lower leg Body parts bathed by helper: Right lower leg, Left lower leg Bathing not applicable: Back Assist Level: Supervision or verbal cues Assistive Device Comment: LH sponge  Function- Upper Body Dressing/Undressing What is the patient wearing?: Bra, Pull over shirt/dress Bra - Perfomed by patient: Thread/unthread left bra strap, Thread/unthread right bra strap, Hook/unhook bra (pull down sports bra) Pull over  shirt/dress - Perfomed by patient: Thread/unthread right sleeve, Thread/unthread left sleeve, Put head through opening, Pull shirt over trunk Pull over shirt/dress - Perfomed by helper: Pull shirt over trunk Assist Level: More than reasonable time Set up : To obtain clothing/put away Function - Lower Body Dressing/Undressing What is the patient wearing?: Pants, Non-skid slipper socks Position: Wheelchair/chair at sink Pants- Performed by patient: Thread/unthread right pants leg, Thread/unthread left pants leg, Pull pants up/down Pants- Performed by helper: Thread/unthread left pants leg Non-skid slipper socks- Performed by patient: Don/doff right sock, Don/doff left sock Non-skid slipper socks- Performed by helper: Don/doff right sock, Don/doff left sock Assist for footwear: Supervision/touching assist Assist for lower body dressing: Supervision or verbal cues Assistive Device Comment: reacher, sock aide  Function - Toileting Toileting steps completed by patient: Adjust clothing prior to toileting, Performs perineal hygiene, Adjust clothing after toileting Toileting steps completed by helper: Adjust clothing prior to toileting, Adjust clothing after toileting Toileting Assistive Devices: Grab bar or rail Assist level: Touching or steadying assistance (Pt.75%)  Function - Air cabin crew transfer assistive device: Elevated toilet seat/BSC over toilet, Grab bar Assist level to toilet: Touching or steadying assistance (Pt > 75%) Assist level from toilet: Touching or steadying assistance (Pt > 75%) Assist level to bedside commode (at bedside): Touching or steadying assistance (Pt > 75%) Assist level from bedside commode (at bedside): Touching or steadying assistance (Pt > 75%)  Function - Chair/bed transfer Chair/bed transfer method: Stand pivot Chair/bed transfer assist level: Touching or steadying assistance (Pt > 75%) Chair/bed transfer assistive device: Armrests,  Walker Chair/bed transfer details: Verbal cues for precautions/safety, Verbal cues for safe use of DME/AE, Verbal cues for sequencing  Function - Locomotion: Wheelchair Will patient use wheelchair at discharge?: Yes Type: Manual  Max wheelchair distance: 160 Assist Level: Supervision or verbal cues Assist Level: Supervision or verbal cues Wheel 150 feet activity did not occur: Safety/medical concerns(fatigue) Assist Level: Supervision or verbal cues Turns around,maneuvers to table,bed, and toilet,negotiates 3% grade,maneuvers on rugs and over doorsills: Yes Function - Locomotion: Ambulation Assistive device: Walker-rolling, Other (comment)(L knee brace) Max distance: 30 Assist level: 2 helpers(minA, w/c follow) Assist level: 2 helpers Walk 50 feet with 2 turns activity did not occur: Safety/medical concerns Assist level: Touching or steadying assistance (Pt > 75%) Walk 150 feet activity did not occur: Safety/medical concerns Walk 10 feet on uneven surfaces activity did not occur: Safety/medical concerns  Function - Comprehension Comprehension: Auditory Comprehension assist level: Follows complex conversation/direction with extra time/assistive device  Function - Expression Expression: Verbal Expression assist level: Expresses complex ideas: With extra time/assistive device  Function - Social Interaction Social Interaction assist level: Interacts appropriately with others with medication or extra time (anti-anxiety, antidepressant).  Function - Problem Solving Problem solving assist level: Solves complex 90% of the time/cues < 10% of the time  Function - Memory Memory assist level: Complete Independence: No helper Patient normally able to recall (first 3 days only): Current season, Location of own room, Staff names and faces, That he or she is in a hospital   Medical Problem List and Plan: 1. 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 DVT Prophylaxis/Anticoagulation: Pharmaceutical:Lovenoxadded. Dopplers negative  3. Pain Management:oxycodone prn effective, continue 4. Mood:LCSW to follow for evaluation and support. Reports that she is managing to deal with diagnosis 5. Neuropsych: This patientiscapable of making decisions on herown behalf. 6. Skin/Wound Care:routine pressure relief measures 7. Fluids/Electrolytes/Nutrition:Monitor I/O.    Encourage fluid 8.HTN: Monitor BP bid. Continue Norvasc and dyazide.    -Blood pressure trending up.  Lisinopril 2.5 mg started on 11/17 Vitals:   01/04/17 1512 01/05/17 0330  BP: (!) 143/63 121/64  Pulse: 73 60  Resp: 16 16  Temp: 98.1 F (36.7 C) 97.6 F (36.4 C)  SpO2: 98% 96%    Improving 11/18 9. ABLA: on iron.   Hemoglobin 11.2 on 11/15  Continue to monitor 10. Leucocytosis: Likely reactive due to steroids. Monitor for signs of infection.   Afebrile  -wbc's 15.1  On 11/15  -Recheck tomorrow 11. AKI: Due to dehydration . Encourage fluid intake.    Labs ordered for tomorrow 12. Hyponatremia:   131 on 11/15  -continue to follow  -Recheck tomorrow  LOS (Days) 10 A FACE TO FACE EVALUATION WAS PERFORMED  Ankit Lorie Phenix 01/05/2017, 8:25 AM

## 2017-01-06 ENCOUNTER — Encounter (HOSPITAL_COMMUNITY): Payer: Medicare Other | Admitting: *Deleted

## 2017-01-06 ENCOUNTER — Inpatient Hospital Stay (HOSPITAL_COMMUNITY): Payer: Medicare Other

## 2017-01-06 ENCOUNTER — Inpatient Hospital Stay (HOSPITAL_COMMUNITY): Payer: Medicare Other | Admitting: Physical Therapy

## 2017-01-06 LAB — CBC WITH DIFFERENTIAL/PLATELET
BASOS ABS: 0 10*3/uL (ref 0.0–0.1)
Basophils Relative: 0 %
EOS PCT: 0 %
Eosinophils Absolute: 0 10*3/uL (ref 0.0–0.7)
HEMATOCRIT: 35.3 % — AB (ref 36.0–46.0)
Hemoglobin: 11.8 g/dL — ABNORMAL LOW (ref 12.0–15.0)
LYMPHS PCT: 2 %
Lymphs Abs: 0.4 10*3/uL — ABNORMAL LOW (ref 0.7–4.0)
MCH: 27.4 pg (ref 26.0–34.0)
MCHC: 33.4 g/dL (ref 30.0–36.0)
MCV: 81.9 fL (ref 78.0–100.0)
Monocytes Absolute: 0.3 10*3/uL (ref 0.1–1.0)
Monocytes Relative: 2 %
NEUTROS ABS: 15.4 10*3/uL — AB (ref 1.7–7.7)
NEUTROS PCT: 96 %
PLATELETS: 283 10*3/uL (ref 150–400)
RBC: 4.31 MIL/uL (ref 3.87–5.11)
RDW: 15.1 % (ref 11.5–15.5)
WBC: 16.1 10*3/uL — AB (ref 4.0–10.5)

## 2017-01-06 LAB — BASIC METABOLIC PANEL
ANION GAP: 9 (ref 5–15)
BUN: 27 mg/dL — AB (ref 6–20)
CHLORIDE: 96 mmol/L — AB (ref 101–111)
CO2: 24 mmol/L (ref 22–32)
Calcium: 8.9 mg/dL (ref 8.9–10.3)
Creatinine, Ser: 0.97 mg/dL (ref 0.44–1.00)
GFR calc Af Amer: >60 mL/min (ref >60)
GLUCOSE: 207 mg/dL — AB (ref 65–99)
POTASSIUM: 3.6 mmol/L (ref 3.5–5.1)
Sodium: 129 mmol/L — ABNORMAL LOW (ref 135–145)

## 2017-01-06 NOTE — Progress Notes (Signed)
Occupational Therapy Session Note  Patient Details  Name: Katrina [name] MRN: [id number] Date of Birth: 09-16-1950  Today's Date: 01/06/2017 OT Individual Time: 0815-0900 OT Individual Time Calculation (min): 45 min    Short Term Goals: Week 2:  OT Short Term Goal 1 (Week 2): STG=LTG secondary to ELOS  Skilled Therapeutic Interventions/Progress Updates:    Pt engaged in BADL retraining including functional transfers, bathing at shower level, and dressing with sit<>stand from w/c at sink.  Pt performed all transfers with grab bars at supervision level.  Pt performed sit<>stand with RW to pull up pants during dressing tasks.  Pt uses AE appropriately for LB bathing/dressing tasks.  Focus on functional transfers, sit<>stand, standing balance, activity tolerance, and safety awareness to increase independence with BADLs.   Therapy Documentation Precautions:  Precautions Precautions: Fall Precaution Booklet Issued: Yes (comment) Precaution Comments: Able to state 3/3 back precautions; maintains precautions during mobility with min cues Restrictions Weight Bearing Restrictions: No  Pain:    See Function Navigator for Current Functional Status.   Therapy/Group: Individual Therapy  Leroy Libman 01/06/2017, 9:03 AM

## 2017-01-06 NOTE — Progress Notes (Signed)
Orthopedic Tech Progress Note Patient Details:  Katrina [name] Aug 31, 1950 [id number]  Patient ID: Katrina [name], female   DOB: 11/03/1950, 66 y.o.   MRN: [id number]   [name] 01/06/2017, 9:48 AM Called in hanger brace order; spoke with [facility]

## 2017-01-06 NOTE — Progress Notes (Signed)
Recreational Therapy Session Note  Patient Details  Name: Katrina [name] MRN: [id number] Date of Birth: [birth year] Today's Date: 01/06/2017  Pain: no c/o Skilled Therapeutic Interventions/Progress Updates: Pt participated in community reintegration/outing to [employer] at overall supervision w/c level.  Pt performed stand pivot transfer to motorized cart with supervision and accessed public room with min assist for management of the door.  Goals focused on safe community mobility, identification & negotiation of obstacles, accessing public restroom, energy conservation techniques/education.  See outing goal sheet in shadow chart for full details.   Therapy/Group: [employer] Katrina [name] 01/06/2017, 3:08 PM

## 2017-01-06 NOTE — Progress Notes (Signed)
Subjective/Complaints: Had a quiet weekend. Feels that balance is improving  ROS: pt denies nausea, vomiting, diarrhea, cough, shortness of breath or chest pain    Objective: Vital Signs: Blood pressure (!) 127/59, pulse 67, temperature 97.8 F (36.6 C), temperature source Oral, resp. rate 17, weight 114 kg (251 lb 5.2 oz), SpO2 99 %. No results found. No results found for this or any previous visit (from the past 72 hour(s)).   HEENT: Normocephalic, atraumatic. Cardio: RRR without murmur. No JVD   Resp: CTA Bilaterally without wheezes or rales. Normal effort . GI: BS +, non-distended  Skin:   Wound C/D/I  Neuro: Alert/Oriented Motor 5/5 bilateral deltoid, bicep, tricep, grip 4+/5 in b/l hip flexor, knee extensors, ankle dorsi/plantar flexors (stable) Musc/Skel: No edema.  No tenderness. Psych: pleasant.  Normal mood and behavior.   Assessment/Plan: 1. 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 Physiatrist is providing close team supervision and 24 hour management of active medical problems listed below. Physiatrist and rehab team continue to assess barriers to discharge/monitor patient progress toward functional and medical goals. FIM: Function - Bathing Position: Shower Body parts bathed by patient: Right arm, Left arm, Chest, Right upper leg, Left upper leg, Abdomen, Front perineal area, Buttocks, Right lower leg, Left lower leg Body parts bathed by helper: Right lower leg, Left lower leg Bathing not applicable: Back Assist Level: Supervision or verbal cues Assistive Device Comment: LH sponge  Function- Upper Body Dressing/Undressing What is the patient wearing?: Bra, Pull over shirt/dress Bra - Perfomed by patient: Thread/unthread left bra strap, Thread/unthread right bra strap, Hook/unhook bra (pull down sports bra) Pull over  shirt/dress - Perfomed by patient: Thread/unthread right sleeve, Thread/unthread left sleeve, Put head through opening, Pull shirt over trunk Pull over shirt/dress - Perfomed by helper: Pull shirt over trunk Assist Level: More than reasonable time Set up : To obtain clothing/put away Function - Lower Body Dressing/Undressing What is the patient wearing?: Pants, Non-skid slipper socks Position: Wheelchair/chair at sink Pants- Performed by patient: Thread/unthread right pants leg, Thread/unthread left pants leg, Pull pants up/down Pants- Performed by helper: Thread/unthread left pants leg Non-skid slipper socks- Performed by patient: Don/doff right sock, Don/doff left sock Non-skid slipper socks- Performed by helper: Don/doff right sock, Don/doff left sock Assist for footwear: Supervision/touching assist Assist for lower body dressing: Supervision or verbal cues Assistive Device Comment: reacher, sock aide  Function - Toileting Toileting steps completed by patient: Adjust clothing prior to toileting, Performs perineal hygiene, Adjust clothing after toileting Toileting steps completed by helper: Adjust clothing prior to toileting, Adjust clothing after toileting Toileting Assistive Devices: Grab bar or rail Assist level: Supervision or verbal cues  Function - Air cabin crew transfer assistive device: Elevated toilet seat/BSC over toilet, Grab bar Assist level to toilet: Supervision or verbal cues Assist level from toilet: Supervision or verbal cues Assist level to bedside commode (at bedside): Touching or steadying assistance (Pt > 75%) Assist level from bedside commode (at bedside): Touching or steadying assistance (Pt > 75%)  Function - Chair/bed transfer Chair/bed transfer method: Stand pivot Chair/bed transfer assist level: Touching or steadying assistance (Pt > 75%) Chair/bed transfer assistive device: Armrests, Walker Chair/bed transfer details: Verbal cues for  precautions/safety, Verbal cues for safe use of DME/AE, Verbal cues for sequencing  Function - Locomotion: Wheelchair Will patient use wheelchair at discharge?: Yes Type: Manual Max wheelchair distance: 160 Assist  Level: Supervision or verbal cues Assist Level: Supervision or verbal cues Wheel 150 feet activity did not occur: Safety/medical concerns(fatigue) Assist Level: Supervision or verbal cues Turns around,maneuvers to table,bed, and toilet,negotiates 3% grade,maneuvers on rugs and over doorsills: Yes Function - Locomotion: Ambulation Assistive device: Walker-rolling, Other (comment)(L knee brace) Max distance: 30 Assist level: 2 helpers(minA, w/c follow) Assist level: 2 helpers Walk 50 feet with 2 turns activity did not occur: Safety/medical concerns Assist level: Touching or steadying assistance (Pt > 75%) Walk 150 feet activity did not occur: Safety/medical concerns Walk 10 feet on uneven surfaces activity did not occur: Safety/medical concerns  Function - Comprehension Comprehension: Auditory Comprehension assist level: Follows complex conversation/direction with extra time/assistive device  Function - Expression Expression: Verbal Expression assist level: Expresses complex ideas: With extra time/assistive device  Function - Social Interaction Social Interaction assist level: Interacts appropriately with others with medication or extra time (anti-anxiety, antidepressant).  Function - Problem Solving Problem solving assist level: Solves complex 90% of the time/cues < 10% of the time  Function - Memory Memory assist level: Complete Independence: No helper Patient normally able to recall (first 3 days only): Current season, Location of own room, Staff names and faces, That he or she is in a hospital   Medical Problem List and Plan: 1. 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 making gains with balance  and gait  2. DVT Prophylaxis/Anticoagulation: Pharmaceutical:Lovenoxadded. Dopplers negative  3. Pain Management:oxycodone prn effective, continue 4. Mood:LCSW to follow for evaluation and support. Reports that she is managing to deal with diagnosis 5. Neuropsych: This patientiscapable of making decisions on herown behalf. 6. Skin/Wound Care:routine pressure relief measures 7. Fluids/Electrolytes/Nutrition:Monitor I/O.    Encourage fluid 8.HTN: Monitor BP bid. Continue Norvasc and dyazide.    -Blood pressure trending up.  Lisinopril 2.5 mg started on 11/17 with improvement Vitals:   01/05/17 1436 01/06/17 0500  BP: 121/64 (!) 127/59  Pulse: 81 67  Resp: 18 17  Temp: (!) 97.5 F (36.4 C) 97.8 F (36.6 C)  SpO2: 98% 99%    Improving 11/18 9. ABLA: on iron.   Hemoglobin 11.2 on 11/15  Continue to monitor 10. Leucocytosis: Likely reactive due to steroids. Monitor for signs of infection.   Afebrile  -wbc's 15.1  On 11/15  -Labs pending for today 11. AKI: Due to dehydration . Encourage fluid intake.    Labs pending 12. Hyponatremia:   131 on 11/15  -Labs pending for today  LOS (Days) 11 A FACE TO FACE EVALUATION WAS PERFORMED  Aily Tzeng T 01/06/2017, 9:10 AM

## 2017-01-06 NOTE — Progress Notes (Signed)
Physical Therapy Session Note  Patient Details  Name: Katrina [name] MRN: [id number] Date of Birth: Sep 14, 1950  Today's Date: 01/06/2017 PT Individual Time: 0915-1015 PT Individual Time Calculation (min): 60 min   Short Term Goals: Week 2:  PT Short Term Goal 1 (Week 2): =LTG due to estimated LOS  Skilled Therapeutic Interventions/Progress Updates: Pt received seated in w/c, denies pain and agreeable to treatment. W/c propulsion throughout session modI with BUE for strengthening and endurance. Gait x20', x40' with RW and minA; cues for L step width for wider BOS, occasional short standing rest breaks d/t fatigue. Gait x30' total including turn around cone with min/modA; increased instability during turn and pt encouraged to slow down, breathe and focus as she became flustered as she approached the cone to initiate turning. Reminded pt that turn around cone is very similar to turn to transfer to w/c. Gait in ADL apartment and performed bed mobility with minA. Transfer on/off recliner with min guard. Car transfer performed min guard with RW. Performed nustep x6 min total on level 5; short rest breaks. Remained seated in w/c at end of session.      Therapy Documentation Precautions:  Precautions Precautions: Fall Precaution Booklet Issued: Yes (comment) Precaution Comments: Able to state 3/3 back precautions; maintains precautions during mobility with min cues Restrictions Weight Bearing Restrictions: No  See Function Navigator for Current Functional Status.   Therapy/Group: Individual Therapy  Luberta Mutter 01/06/2017, 10:24 AM

## 2017-01-06 NOTE — Progress Notes (Signed)
PRN trazodone and ultram given at 2003, per patient's request. Slept good during night.Katrina [name] A

## 2017-01-06 NOTE — Progress Notes (Addendum)
Occupational Therapy Note  Patient Details  Name: Katrina [name] MRN: [id number] Date of Birth: 20-May-1950  Today's Date: 01/06/2017 OT Individual Time: 1300-1425 OT Individual Time Calculation (min): 85 min   Pt denies pain Individual Therapy  Pt participated in community integration activity (with Recreational therapist-cotx) to local store ([employer]). Pt propelled w/c >150' in hospital and in store while navigating aisles to look for shoes.  Pt also accessed public restroom with min A for managing door.  Discussed home safety and energy conservation strategies.Pt performed stand pivot transfer to electric cart at supervision level. Pt returned to room and remained in w/c with all needs within reach.  See goal sheet in shadow chart.  [name] [facility] 01/06/2017, 2:56 PM

## 2017-01-07 ENCOUNTER — Encounter (HOSPITAL_COMMUNITY): Payer: Medicare Other

## 2017-01-07 ENCOUNTER — Inpatient Hospital Stay (HOSPITAL_COMMUNITY): Payer: Medicare Other

## 2017-01-07 ENCOUNTER — Inpatient Hospital Stay (HOSPITAL_COMMUNITY): Payer: Medicare Other | Admitting: Physical Therapy

## 2017-01-07 LAB — CBC WITH DIFFERENTIAL/PLATELET
BASOS PCT: 0 %
Basophils Absolute: 0 10*3/uL (ref 0.0–0.1)
EOS ABS: 0 10*3/uL (ref 0.0–0.7)
Eosinophils Relative: 0 %
HCT: 32.9 % — ABNORMAL LOW (ref 36.0–46.0)
HEMOGLOBIN: 10.7 g/dL — AB (ref 12.0–15.0)
Lymphocytes Relative: 15 %
Lymphs Abs: 1.4 10*3/uL (ref 0.7–4.0)
MCH: 26.5 pg (ref 26.0–34.0)
MCHC: 32.5 g/dL (ref 30.0–36.0)
MCV: 81.4 fL (ref 78.0–100.0)
Monocytes Absolute: 0.8 10*3/uL (ref 0.1–1.0)
Monocytes Relative: 8 %
NEUTROS PCT: 77 %
Neutro Abs: 7.5 10*3/uL (ref 1.7–7.7)
Platelets: 217 10*3/uL (ref 150–400)
RBC: 4.04 MIL/uL (ref 3.87–5.11)
RDW: 15.2 % (ref 11.5–15.5)
WBC: 9.7 10*3/uL (ref 4.0–10.5)

## 2017-01-07 LAB — COMPREHENSIVE METABOLIC PANEL
ALBUMIN: 3.3 g/dL — AB (ref 3.5–5.0)
ALK PHOS: 61 U/L (ref 38–126)
ALT: 19 U/L (ref 14–54)
AST: 20 U/L (ref 15–41)
Anion gap: 10 (ref 5–15)
BILIRUBIN TOTAL: 0.6 mg/dL (ref 0.3–1.2)
BUN: 25 mg/dL — AB (ref 6–20)
CO2: 25 mmol/L (ref 22–32)
Calcium: 8.7 mg/dL — ABNORMAL LOW (ref 8.9–10.3)
Chloride: 95 mmol/L — ABNORMAL LOW (ref 101–111)
Creatinine, Ser: 0.83 mg/dL (ref 0.44–1.00)
GFR calc Af Amer: >60 mL/min (ref >60)
GFR calc non Af Amer: >60 mL/min (ref >60)
GLUCOSE: 93 mg/dL (ref 65–99)
POTASSIUM: 3.3 mmol/L — AB (ref 3.5–5.1)
Sodium: 130 mmol/L — ABNORMAL LOW (ref 135–145)
TOTAL PROTEIN: 5.8 g/dL — AB (ref 6.5–8.1)

## 2017-01-07 LAB — URIC ACID: Uric Acid, Serum: 5.9 mg/dL (ref 2.3–6.6)

## 2017-01-07 MED ORDER — TRIAMTERENE-HCTZ 37.5-25 MG PO CAPS: 1.0000 | ORAL_CAPSULE | Freq: Every day | ORAL | 0 refills | Status: DC

## 2017-01-07 MED ORDER — POTASSIUM CHLORIDE CRYS ER 20 MEQ PO TBCR: 20.0000 meq | EXTENDED_RELEASE_TABLET | Freq: Every day | ORAL | 0 refills | Status: DC

## 2017-01-07 MED ORDER — AMLODIPINE BESYLATE 10 MG PO TABS: 10.0000 mg | ORAL_TABLET | Freq: Every day | ORAL | 0 refills | Status: DC

## 2017-01-07 MED ORDER — FAMOTIDINE 20 MG PO TABS: 20.0000 mg | ORAL_TABLET | Freq: Every day | ORAL | 0 refills | Status: DC

## 2017-01-07 MED ORDER — POLYSACCHARIDE IRON COMPLEX 150 MG PO CAPS: 150.0000 mg | ORAL_CAPSULE | Freq: Two times a day (BID) | ORAL | 0 refills | Status: DC

## 2017-01-07 MED ORDER — DEXAMETHASONE 6 MG PO TABS
6.0000 mg | ORAL_TABLET | Freq: Every day | ORAL | Status: DC
  Administered 2017-01-08: 6 mg via ORAL
  Filled 2017-01-07: qty 1

## 2017-01-07 MED ORDER — POTASSIUM CHLORIDE CRYS ER 20 MEQ PO TBCR
20.0000 meq | EXTENDED_RELEASE_TABLET | Freq: Every day | ORAL | Status: DC
  Administered 2017-01-07 – 2017-01-08 (×2): 20 meq via ORAL
  Filled 2017-01-07 (×2): qty 1

## 2017-01-07 MED ORDER — TRAMADOL HCL 50 MG PO TABS: 50.0000 mg | ORAL_TABLET | Freq: Four times a day (QID) | ORAL | 0 refills | Status: DC | PRN

## 2017-01-07 MED ORDER — DEXAMETHASONE 6 MG PO TABS: 6.0000 mg | ORAL_TABLET | Freq: Every day | ORAL | 0 refills | Status: DC

## 2017-01-07 MED ORDER — CYCLOBENZAPRINE HCL 10 MG PO TABS: 10.0000 mg | ORAL_TABLET | Freq: Three times a day (TID) | ORAL | 0 refills | Status: DC | PRN

## 2017-01-07 MED ORDER — DOCUSATE SODIUM 100 MG PO CAPS: 100.0000 mg | ORAL_CAPSULE | Freq: Two times a day (BID) | ORAL | 0 refills | Status: DC

## 2017-01-07 NOTE — Progress Notes (Signed)
Occupational Therapy Session Note  Patient Details  Name: Katrina [name] MRN: [id number] Date of Birth: March 11, 1950  Today's Date: 01/07/2017 OT Individual Time: 1000-1100 OT Individual Time Calculation (min): 60 min    Short Term Goals: Week 2:  OT Short Term Goal 1 (Week 2): STG=LTG secondary to ELOS  Skilled Therapeutic Interventions/Progress Updates:    Pt resting in w/c upon arrival with sister present.  Granddaughter arrived later in session.  Focus on BADL retraining (shower, toileting, and functional transfers) and family education with sister and granddaughter.  Pt completed all tasks, including functional transfers with RW, at supervision level.  Pt's sister and granddaughter verbalized understanding and granddaughter demonstrated understanding of assistance required during functional transfers.  Recommended that pt and family complete "dry run" of ambulating into bathroom and BSC/tub bench transfers.  All present verbalized understanding.  Pt returned to room and remained in w/c with family present.   Therapy Documentation Precautions:  Precautions Precautions: Fall Precaution Booklet Issued: Yes (comment) Precaution Comments: Able to state 3/3 back precautions; maintains precautions during mobility with min cues Restrictions Weight Bearing Restrictions: No Pain:  Pt denies pain  See Function Navigator for Current Functional Status.   Therapy/Group: Individual Therapy  Leroy Libman 01/07/2017, 12:11 PM

## 2017-01-07 NOTE — Patient Care Conference (Signed)
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 AKI (acute kidney injury) ([place])   . Hyponatremia   . Acute blood loss anemia   . Benign essential HTN   . 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 Diffuse large B cell lymphoma ([place]) 12/24/2016  . Paraparesis ([place]) 12/19/2016  . Chest wall mass 12/19/2016  . 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 S/P lumbar laminectomy 12/18/2016  . Numbness and tingling 12/13/2016  . Numbness and tingling of both lower extremities 12/12/2016  . Hypertension 12/12/2016  . Anemia 12/12/2016  . Abdominal lymphadenopathies 12/12/2016  . Renal mass, right 12/12/2016  . Constipation 12/12/2016  . FH: colon cancer 10/22/2013  . RUQ discomfort 10/22/2013    Expected Discharge Date: Expected Discharge Date: 01/08/17  Team Members Present: Physician leading conference: Dr. Alger Simons Social Worker Present: Lennart Pall, LCSW Nurse Present: Elliot Cousin, RN PT Present: Kem Parkinson, PT OT Present: Roanna Epley, [place], OT PPS Coordinator present : Daiva Nakayama, RN, CRRN     Current Status/Progress Goal Weekly Team Focus  Medical   Patient showing ongoing progress with mobility.  Still has sensory loss/proprioceptive loss in both limbs.  Pain minimal at this point.  Continues on  Decadron  Improve balance and activity tolerance  Finalize   Bowel/Bladder   Continent of  bowel/bladder  Maintain continence  Assess toileting needs. Last BM 01/05/17. Administer stool softner as prescribed   Swallow/Nutrition/ Hydration             ADL's   bathing/dressing-supervision; functional tranfsers-steady A/supervision  Supervision overall, Min A shower transfer  education, activity tolerance, funcitonal transfers, safety awareness   Mobility      modI bed mobility, S transfers, downgraded to minA gait x20' in home  transfer/gait training, dynamic standing balance, d/c planning and family education   Communication             Safety/Cognition/ Behavioral Observations  Alert and oriented X4, one assist stand pivot with walker  Prevent falls by answering calls promptly  Maintain safety precautions, pt education for safety. bed and chair alarm   Pain   C/o pain at lower back at 7/10, tramadol is ordered PRN and it is helping control the pain  pain level of <3  Assess pain level and administer pain med as ordered   Skin   Surgical site at the back, steri strip, no drainage noted. Right chest porta cath without complications  Infection prevention to surgical sites  Assess for sgns of infection and monitor surgical sites for drainage    Rehab Goals Patient on target to meet rehab goals: Yes *See Care Plan and progress notes for long and short-term goals.     Barriers to Discharge  Current Status/Progress Possible Resolutions Date Resolved   Physician             None identified at present  Nursing                  PT                    OT                  SLP                SW                Discharge Planning/Teaching Needs:  Plan to d/c home to son's home with family arranging 24/7 assistance if needed.   teaching completed with sister and neice today   Team Discussion:  Family ed completed today and pt ready for d/c tomorrow.  No concerns.  Revisions to Treatment Plan:  None    Continued Need for Acute Rehabilitation Level of Care: The patient  requires daily medical management by a physician with specialized training in physical medicine and rehabilitation for the following conditions: Daily direction of a multidisciplinary physical rehabilitation program to ensure safe treatment while eliciting the highest outcome that is of practical value to the patient.: Yes Daily medical management of patient stability for increased activity during participation in an intensive rehabilitation regime.: Yes Daily analysis of laboratory values and/or radiology reports with any subsequent need for medication adjustment of medical intervention for : Neurological problems;Post surgical problems  Greg Cratty 01/07/2017, 3:32 PM

## 2017-01-07 NOTE — Progress Notes (Signed)
Physical Therapy Session Note  Patient Details  Name: Katrina [name] MRN: [id number] Date of Birth: 1950/09/10  Today's Date: 01/07/2017 PT Individual Time: 0803-0830 PT Individual Time Calculation (min): 27 min   Short Term Goals: Week 2:  PT Short Term Goal 1 (Week 2): =LTG due to estimated LOS  Skilled Therapeutic Interventions/Progress Updates: Pt presented in w/c agreeable to therapy. Session focused on functional activities in preparation for d/c. Pt donned pants in w/c with use of reacher and was able to pull up pants in standing with supervision and use of RW. Pt propelled to ADL apt and performed bed mobility minA for sit to supine and supervision supine to sit. Furniture transfer with supervision with pt able to problem solve positioning to facilitate transfer. Pt also performed car transfer with supervision. Pt transported back to room total assist for time management and remained in w/c with call bell within reach and needs met.      Therapy Documentation Precautions:  Precautions Precautions: Fall Precaution Booklet Issued: Yes (comment) Precaution Comments: Able to state 3/3 back precautions; maintains precautions during mobility with min cues Restrictions Weight Bearing Restrictions: No General:   Vital Signs:     See Function Navigator for Current Functional Status.   Therapy/Group: Individual Therapy  Bayden Gil  Neala Miggins, PTA  01/07/2017, 12:13 PM

## 2017-01-07 NOTE — Progress Notes (Signed)
Social Work Patient ID: Katrina [name], female   DOB: 11/27/1950, 66 y.o.   MRN: [id number]   Met with pt this afternoon who is eager for d/c tomorrow morning.  She feels that family training went well with sister and niece today.  We discussed HH and DME needs/ referrals made.  No further concerns.  Jehu Mccauslin, LCSW

## 2017-01-07 NOTE — Discharge Summary (Signed)
Physician Discharge Summary  Patient ID: [name] MRN: [id number] DOB/AGE: 1950/12/23 66 y.o.  Admit date: 12/26/2016 Discharge date: 01/08/2017  Discharge Diagnoses:  Principal Problem:   Thoracic myelopathy Active Problems:   Hypertension   Constipation   Paraparesis ([facility])   Hyponatremia   Acute blood loss anemia   Benign essential HTN   AKI (acute kidney injury) ([place])   Hypokalemia   Discharged Condition: stable   Significant Diagnostic Studies:N/A   Procedures:  Ir Fluoro Guide Port Insertion Right  Result Date: 12/30/2016 CLINICAL DATA:  Diffuse large B-cell lymphoma and need for porta cath to begin chemotherapy. EXAM: IMPLANTED PORT A CATH PLACEMENT WITH ULTRASOUND AND FLUOROSCOPIC GUIDANCE ANESTHESIA/SEDATION: 2.0 mg IV Versed; 100 mcg IV Fentanyl Total Moderate Sedation Time:  33 minutes The patient's level of consciousness and physiologic status were continuously monitored during the procedure by Radiology nursing. Additional Medications: 2 g IV Ancef. FLUOROSCOPY TIME:  24 seconds.  2.0 mGy. PROCEDURE: The procedure, risks, benefits, and alternatives were explained to the patient. Questions regarding the procedure were encouraged and answered. The patient understands and consents to the procedure. A time-out was performed prior to initiating the procedure. Ultrasound was utilized to confirm patency of the right internal jugular vein. The right neck and chest were prepped with chlorhexidine in a sterile fashion, and a sterile drape was applied covering the operative field. Maximum barrier sterile technique with sterile gowns and gloves were used for the procedure. Local anesthesia was provided with 1% lidocaine. After creating a small venotomy incision, a 21 gauge needle was advanced into the right internal jugular vein under direct, real-time ultrasound guidance. Ultrasound image documentation was performed. After securing guidewire access, an 8 Fr dilator was placed. A  J-wire was kinked to measure appropriate catheter length. A subcutaneous port pocket was then created along the upper chest wall utilizing sharp and blunt dissection. Portable cautery was utilized. The pocket was irrigated with sterile saline. A single lumen power injectable port was chosen for placement. The 8 Fr catheter was tunneled from the port pocket site to the venotomy incision. The port was placed in the pocket. External catheter was trimmed to appropriate length based on guidewire measurement. At the venotomy, an 8 Fr peel-away sheath was placed over a guidewire. The catheter was then placed through the sheath and the sheath removed. Final catheter positioning was confirmed and documented with a fluoroscopic spot image. The port was accessed with a needle and aspirated and flushed with heparinized saline. The access needle was removed. The venotomy and port pocket incisions were closed with subcutaneous 3-0 Monocryl and subcuticular 4-0 Vicryl. Dermabond was applied to both incisions. COMPLICATIONS: COMPLICATIONS None FINDINGS: After catheter placement, the tip lies at the cavo-atrial junction. The catheter aspirates normally and is ready for immediate use. IMPRESSION: Placement of single lumen port a cath via right internal jugular vein. The catheter tip lies at the cavo-atrial junction. A power injectable port a cath was placed and is ready for immediate use. Electronically Signed   By: Aletta Edouard M.D.   On: 12/30/2016 13:10     Labs:  Basic Metabolic Panel: Recent Labs  Lab 01/02/17 0608 01/06/17 1445 01/07/17 0525  NA 131* 129* 130*  K 3.3* 3.6 3.3*  CL 95* 96* 95*  CO2 27 24 25   GLUCOSE 105* 207* 93  BUN 24* 27* 25*  CREATININE 0.83 0.97 0.83  CALCIUM 8.9 8.9 8.7*    CBC: Recent Labs  Lab 01/02/17 0608 01/06/17 1445 01/07/17  0525  WBC 15.1* 16.1* 9.7  NEUTROABS  --  15.4* 7.5  HGB 11.2* 11.8* 10.7*  HCT 34.0* 35.3* 32.9*  MCV 81.9 81.9 81.4  PLT 340 283 217     CBG: No results for input(s): GLUCAP in the last 168 hours.  Brief HPI:   Katrina [name] a 66 y.o.femalewith recent admission for BLE numbness/weakness wwith difficulty walking with work up revealing extensive diffuse lymphadenopathy, LUL and left chest wall metastases, right renal mass and pathologic left rib fractures. She was discharged to home with walker and plans to follow up with Hem/onc but continued to have progressive RLE weakness with ascending numbness to lower abdomen. 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 she underwent decompressive thoracic laminectomy from T5-T8 for resection of tumor by Dr. Saintclair Halsted  Pathology revealed diffuse large B-cell lymphoma and Dr. Alvy Bimler chemo with first course on Nov 19th or sooner if doing well. Patient with paraplegia and significant deficits in mobility as well as ability to carry out ADL tasks. CIRwasrecommended by rehab team.   Hospital Course: Katrina [name] was admitted to rehab 12/26/2016 for inpatient therapies to consist of PT and OT at least three hours five days a week. Past admission physiatrist, therapy team and rehab RN have worked together to provide customized collaborative inpatient rehab. Port a cath was placed without difficulty by Dr. Kathlene Cote on 11/12. Blood pressures were monitored on bid basis and low dose lisinopril was added briefly for tighter control. She was encouraged to increase fluid intake for help with pre-renal azotemia. Mild hypokalemia improved with brief supplementation but for tighter control. Hyponatremia has improved but she continued  to have hypokalemia without supplementation. She was started on low dose K dur at discharge. Back incision is healing well without signs or symptoms of infection. Reactive leucocytosis has resolved and ABLA  has been relatively stable.   Pain has been managed with prn use of ultram and constipation has resolved. She is continent of bowel and bladder. Dr. Alvy Bimler has followed for input on chemo and this was delayed to 11/26 to allow patient to complete her rehab course. Mood has been stable and she has had improvement in mobility and activity tolerance. She continues to have decreased proprioception BLE stable 4+/5 strength. She has progressed to supervision at wheelchair level. She will to receive follow up HHPT and [facility] [facility] after discharge.   Rehab course: During patient's stay in rehab weekly team conferences were held to monitor patient's progress, set goals and discuss barriers to discharge. At admission, patient required mod assist with mobility and mod to max assist with self care tasks. She has had improvement in activity tolerance, balance, postural control, as well as ability to compensate for deficits. She is has had improvement in functional use of LLE and RLE as well as improved awareness. She is able to complete ADLs and simple home management tasks with supervision at wheelchair level. She requires min assist with BLE for bed mobility, supervision for transfers and is able to ambulate 7.' with min assist, left knee brace for stability and RW.     Disposition: 01-Home or Self Care  Diet: Regular.   Special Instructions: 1. Needs 24 hours supervision and assistance with mobility.   Discharge Instructions    Ambulatory referral to Physical Medicine Rehab  Complete by:  As directed    1-2 weeks transitional care appt     Discharge Medication List as of 01/08/2017  8:34 AM    START taking these medications   Details  dexamethasone (DECADRON) 6 MG tablet Take 1 tablet (6 mg total) by mouth daily., Starting Wed 01/08/2017, Print    famotidine (PEPCID) 20 MG tablet Take 1 tablet (20 mg total) by mouth daily., Starting Wed 01/08/2017, Print    iron polysaccharides  (NIFEREX) 150 MG capsule Take 1 capsule (150 mg total) by mouth 2 times daily at 12 noon and 4 pm., Starting Tue 01/07/2017, Print    potassium chloride SA (K-DUR,KLOR-CON) 20 MEQ tablet Take 1 tablet (20 mEq total) by mouth daily., Starting Wed 01/08/2017, Print    traMADol (ULTRAM) 50 MG tablet Take 1 tablet (50 mg total) by mouth every 6 (six) hours as needed for severe pain., Starting Tue 01/07/2017, Print Rx # 20 pills      CONTINUE these medications which have CHANGED   Details  amLODipine (NORVASC) 10 MG tablet Take 1 tablet (10 mg total) by mouth daily., Starting Tue 01/07/2017, Print    cyclobenzaprine (FLEXERIL) 10 MG tablet Take 1 tablet (10 mg total) by mouth 3 (three) times daily as needed for muscle spasms., Starting Tue 01/07/2017, Print    docusate sodium (COLACE) 100 MG capsule Take 1 capsule (100 mg total) by mouth 2 (two) times daily., Starting Tue 01/07/2017, Print    triamterene-hydrochlorothiazide (DYAZIDE) 37.5-25 MG capsule Take 1 each (1 capsule total) by mouth daily., Starting Tue 01/07/2017, Print      STOP taking these medications     ibuprofen (ADVIL,MOTRIN) 400 MG tablet      oxyCODONE-acetaminophen (PERCOCET/ROXICET) 5-325 MG tablet        Follow-up Information    Meredith Staggers, MD Follow up.   Specialty:  Physical Medicine and Rehabilitation Why:  office will call you with follow up appointment Contact information: [address] [place] [address] [phone]        Gorsuch, Ni, MD Follow up.   Specialty:  Hematology and Oncology Contact information: [place] [id number] [phone]        Kary Kos, MD Follow up.   Specialty:  Neurosurgery Contact information: [address] [place] [address] [phone]           Signed: Bary Leriche 01/08/2017, 6:59 PM

## 2017-01-07 NOTE — Progress Notes (Signed)
Subjective/Complaints: No new issues. Doing more standing. Outing went well  ROS: pt denies nausea, vomiting, diarrhea, cough, shortness of breath or chest pain    Objective: Vital Signs: Blood pressure 121/67, pulse 66, temperature 97.8 F (36.6 C), temperature source Oral, resp. rate 18, weight 114 kg (251 lb 5.2 oz), SpO2 99 %. No results found. Results for orders placed or performed during the hospital encounter of 12/26/16 (from the past 72 hour(s))  Basic metabolic panel     Status: Abnormal   Collection Time: 01/06/17  2:45 PM  Result Value Ref Range   Sodium 129 (L) 135 - 145 mmol/L   Potassium 3.6 3.5 - 5.1 mmol/L   Chloride 96 (L) 101 - 111 mmol/L   CO2 24 22 - 32 mmol/L   Glucose, Bld 207 (H) 65 - 99 mg/dL   BUN 27 (H) 6 - 20 mg/dL   Creatinine, Ser 0.97 0.44 - 1.00 mg/dL   Calcium 8.9 8.9 - 10.3 mg/dL   GFR calc non Af Amer >60 >60 mL/min   GFR calc Af Amer >60 >60 mL/min    Comment: (NOTE) The eGFR has been calculated using the CKD EPI equation. This calculation has not been validated in all clinical situations. eGFR's persistently <60 mL/min signify possible Chronic Kidney Disease.    Anion gap 9 5 - 15  CBC with Differential/Platelet     Status: Abnormal   Collection Time: 01/06/17  2:45 PM  Result Value Ref Range   WBC 16.1 (H) 4.0 - 10.5 K/uL   RBC 4.31 3.87 - 5.11 MIL/uL   Hemoglobin 11.8 (L) 12.0 - 15.0 g/dL   HCT 35.3 (L) 36.0 - 46.0 %   MCV 81.9 78.0 - 100.0 fL   MCH 27.4 26.0 - 34.0 pg   MCHC 33.4 30.0 - 36.0 g/dL   RDW 15.1 11.5 - 15.5 %   Platelets 283 150 - 400 K/uL   Neutrophils Relative % 96 %   Neutro Abs 15.4 (H) 1.7 - 7.7 K/uL   Lymphocytes Relative 2 %   Lymphs Abs 0.4 (L) 0.7 - 4.0 K/uL   Monocytes Relative 2 %   Monocytes Absolute 0.3 0.1 - 1.0 K/uL   Eosinophils Relative 0 %   Eosinophils Absolute 0.0 0.0 - 0.7 K/uL   Basophils Relative 0 %   Basophils Absolute 0.0 0.0 - 0.1 K/uL  CBC with Differential/Platelet     Status:  Abnormal   Collection Time: 01/07/17  5:25 AM  Result Value Ref Range   WBC 9.7 4.0 - 10.5 K/uL   RBC 4.04 3.87 - 5.11 MIL/uL   Hemoglobin 10.7 (L) 12.0 - 15.0 g/dL   HCT 32.9 (L) 36.0 - 46.0 %   MCV 81.4 78.0 - 100.0 fL   MCH 26.5 26.0 - 34.0 pg   MCHC 32.5 30.0 - 36.0 g/dL   RDW 15.2 11.5 - 15.5 %   Platelets 217 150 - 400 K/uL   Neutrophils Relative % 77 %   Neutro Abs 7.5 1.7 - 7.7 K/uL   Lymphocytes Relative 15 %   Lymphs Abs 1.4 0.7 - 4.0 K/uL   Monocytes Relative 8 %   Monocytes Absolute 0.8 0.1 - 1.0 K/uL   Eosinophils Relative 0 %   Eosinophils Absolute 0.0 0.0 - 0.7 K/uL   Basophils Relative 0 %   Basophils Absolute 0.0 0.0 - 0.1 K/uL  Comprehensive metabolic panel     Status: Abnormal   Collection Time: 01/07/17  5:25 AM  Result Value Ref Range   Sodium 130 (L) 135 - 145 mmol/L   Potassium 3.3 (L) 3.5 - 5.1 mmol/L   Chloride 95 (L) 101 - 111 mmol/L   CO2 25 22 - 32 mmol/L   Glucose, Bld 93 65 - 99 mg/dL   BUN 25 (H) 6 - 20 mg/dL   Creatinine, Ser 0.83 0.44 - 1.00 mg/dL   Calcium 8.7 (L) 8.9 - 10.3 mg/dL   Total Protein 5.8 (L) 6.5 - 8.1 g/dL   Albumin 3.3 (L) 3.5 - 5.0 g/dL   AST 20 15 - 41 U/L   ALT 19 14 - 54 U/L   Alkaline Phosphatase 61 38 - 126 U/L   Total Bilirubin 0.6 0.3 - 1.2 mg/dL   GFR calc non Af Amer >60 >60 mL/min   GFR calc Af Amer >60 >60 mL/min    Comment: (NOTE) The eGFR has been calculated using the CKD EPI equation. This calculation has not been validated in all clinical situations. eGFR's persistently <60 mL/min signify possible Chronic Kidney Disease.    Anion gap 10 5 - 15  Uric acid     Status: None   Collection Time: 01/07/17  5:25 AM  Result Value Ref Range   Uric Acid, Serum 5.9 2.3 - 6.6 mg/dL     HEENT: Normocephalic, atraumatic. Cardio: RRR without murmur. No JVD    Resp: CTA Bilaterally without wheezes or rales. Normal effort  . GI:  BS +, non-tender, non-distended  Skin:   Wound C/D/I  Neuro: Alert/Oriented Motor  5/5 bilateral deltoid, bicep, tricep, grip 4+/5 in b/l hip flexor, knee extensors, ankle dorsi/plantar flexors (stable) -Decreased proprioception in both lower extremities Musc/Skel: No edema.  No tenderness. Psych: pleasant.  Normal mood and behavior.   Assessment/Plan: 1. 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 Physiatrist is providing close team supervision and 24 hour management of active medical problems listed below. Physiatrist and rehab team continue to assess barriers to discharge/monitor patient progress toward functional and medical goals. FIM: Function - Bathing Position: Shower Body parts bathed by patient: Right arm, Left arm, Chest, Right upper leg, Left upper leg, Abdomen, Front perineal area, Buttocks, Right lower leg, Left lower leg Body parts bathed by helper: Right lower leg, Left lower leg Bathing not applicable: Back Assist Level: Supervision or verbal cues Assistive Device Comment: LH sponge  Function- Upper Body Dressing/Undressing What is the patient wearing?: Bra, Pull over shirt/dress Bra - Perfomed by patient: Thread/unthread left bra strap, Thread/unthread right bra strap, Hook/unhook bra (pull down sports bra) Pull over shirt/dress - Perfomed by patient: Thread/unthread right sleeve, Thread/unthread left sleeve, Put head through opening, Pull shirt over trunk Pull over shirt/dress - Perfomed by helper: Pull shirt over trunk Assist Level: More than reasonable time Set up : To obtain clothing/put away Function - Lower Body Dressing/Undressing What is the patient wearing?: Pants, Non-skid slipper socks Position: Wheelchair/chair at sink Pants- Performed by patient: Thread/unthread right pants leg, Thread/unthread left pants leg, Pull pants up/down Pants- Performed by helper: Thread/unthread left pants leg Non-skid  slipper socks- Performed by patient: Don/doff right sock, Don/doff left sock Non-skid slipper socks- Performed by helper: Don/doff right sock, Don/doff left sock Assist for footwear: Supervision/touching assist Assist for lower body dressing: Supervision or verbal cues Assistive Device Comment: reacher, sock aide  Function - Toileting Toileting steps completed by patient: Adjust clothing prior  to toileting, Performs perineal hygiene, Adjust clothing after toileting Toileting steps completed by helper: Adjust clothing prior to toileting, Adjust clothing after toileting [place]: Grab bar or rail Assist level: Supervision or verbal cues  Function - Toilet Transfers Toilet transfer assistive device: Elevated toilet seat/BSC over toilet, Grab bar Assist level to toilet: Supervision or verbal cues Assist level from toilet: Supervision or verbal cues Assist level to bedside commode (at bedside): Touching or steadying assistance (Pt > 75%) Assist level from bedside commode (at bedside): Touching or steadying assistance (Pt > 75%)  Function - Chair/bed transfer Chair/bed transfer method: Stand pivot, Ambulatory Chair/bed transfer assist level: Touching or steadying assistance (Pt > 75%) Chair/bed transfer assistive device: Armrests, Walker Chair/bed transfer details: Verbal cues for precautions/safety, Verbal cues for safe use of DME/AE, Verbal cues for sequencing  Function - Locomotion: Wheelchair Will patient use wheelchair at discharge?: Yes Type: Manual Max wheelchair distance: 160 Assist Level: Supervision or verbal cues Assist Level: Supervision or verbal cues Wheel 150 feet activity did not occur: Safety/medical concerns(fatigue) Assist Level: Supervision or verbal cues Turns around,maneuvers to table,bed, and toilet,negotiates 3% grade,maneuvers on rugs and over doorsills: Yes Function - Locomotion: Ambulation Assistive device: Walker-rolling, Other (comment)(L  knee brace) Max distance: 40 Assist level: Touching or steadying assistance (Pt > 75%) Assist level: Touching or steadying assistance (Pt > 75%) Walk 50 feet with 2 turns activity did not occur: Safety/medical concerns Assist level: Touching or steadying assistance (Pt > 75%) Walk 150 feet activity did not occur: Safety/medical concerns Walk 10 feet on uneven surfaces activity did not occur: Safety/medical concerns  Function - Comprehension Comprehension: Auditory Comprehension assist level: Follows complex conversation/direction with no assist, Follows complex conversation/direction with extra time/assistive device  Function - Expression Expression: Verbal Expression assist level: Expresses complex ideas: With no assist  Function - Social Interaction Social Interaction assist level: Interacts appropriately with others - No medications needed.  Function - Problem Solving Problem solving assist level: Solves complex problems: Recognizes & self-corrects  Function - Memory Memory assist level: Complete Independence: No helper Patient normally able to recall (first 3 days only): Current season, Location of own room, Staff names and faces, That he or she is in a hospital   Medical Problem List and Plan: 1. 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 team conference today 2. DVT Prophylaxis/Anticoagulation: Pharmaceutical:Lovenoxadded. Dopplers negative  3. Pain Management:oxycodone prn effective, continue 4. Mood:LCSW to follow for evaluation and support. Reports that she is managing to deal with diagnosis 5. Neuropsych: This patientiscapable of making decisions on herown behalf. 6. Skin/Wound Care:routine pressure relief measures 7. Fluids/Electrolytes/Nutrition:Monitor I/O.    Encourage fluid  -Potassium supplementation for hypokalemia 8.HTN: Monitor BP bid. Continue Norvasc and dyazide.    -  Lisinopril  2.5 mg started on 11/17 with improvement--I am going to stop and observe off medication. Not sure it's indicated especially at such a low dose Vitals:   01/06/17 1518 01/07/17 0500  BP: (!) 142/71 121/67  Pulse: 78 66  Resp: 18 18  Temp: 98.1 F (36.7 C) 97.8 F (36.6 C)  SpO2: 96% 99%    Improving 11/18 9. ABLA: on iron.   Hemoglobin 10.7 today  Continue supplements 10. Leucocytosis: Likely reactive due to steroids. Monitor for signs of infection.   Afebrile  -White blood cells down to 9.7 today 11. AKI: Due to dehydration . Encourage fluid intake.    -Renal function nearly normal.  Continue to  push fluids 12. Hyponatremia:   131 on 11/15  -Labs generally stable today at 130.  There is follow-up as an outpatient.  LOS (Days) 12 A FACE TO FACE EVALUATION WAS PERFORMED  Tricha Ruggirello T 01/07/2017, 8:32 AM

## 2017-01-07 NOTE — Progress Notes (Signed)
Katrina [name]   DOB:1950-07-11   XN#:[id number]    Subjective: She feels better.  She is getting stronger.  The paresthesia sensation is improving. She denies pain  Objective:  Vitals:   01/07/17 0500 01/07/17 1441  BP: 121/67 (!) 156/60  Pulse: 66 73  Resp: 18 18  Temp: 97.8 F (36.6 C) 97.7 F (36.5 C)  SpO2: 99% 99%     Intake/Output Summary (Last 24 hours) at 01/07/2017 1700 Last data filed at 01/06/2017 1823 Gross per 24 hour  Intake 240 ml  Output -  Net 240 ml    GENERAL:alert, no distress and comfortable SKIN: skin color, texture, turgor are normal, no rashes or significant lesions EYES: normal, Conjunctiva are pink and non-injected, sclera clear Musculoskeletal:no cyanosis of digits and no clubbing  NEURO: alert & oriented x 3 with fluent speech, no focal motor/sensory deficits   Labs:  Lab Results  Component Value Date   WBC 9.7 01/07/2017   HGB 10.7 (L) 01/07/2017   HCT 32.9 (L) 01/07/2017   MCV 81.4 01/07/2017   PLT 217 01/07/2017   NEUTROABS 7.5 01/07/2017    Lab Results  Component Value Date   NA 130 (L) 01/07/2017   K 3.3 (L) 01/07/2017   CL 95 (L) 01/07/2017   CO2 25 01/07/2017    Assessment & Plan:   Stage IV diffuse large B-cell lymphoma I received an update from rehab center stating that the patient might be discharged on January 08, 2017 The patient is doing very well and I think she is benefiting from rehab For that reason, I recommend delaying her chemotherapy until January 13, 2017  Cord compression status post surgery She is really improving daily. She will continue rehab  Mild anemia Not symptomatic. Observe  Discharge planning As above.   I will sign off.  I would arrange schedule inpatient chemotherapy admission on January 13, 2017  Heath Lark, MD 01/07/2017  5:00 PM

## 2017-01-07 NOTE — Progress Notes (Signed)
Physical Therapy Discharge Summary  Patient Details  Name: Katrina [name] MRN: [id number] Date of Birth: 08-Jul-1950  Today's Date: 01/07/2017 PT Individual Time: 1100-1200 PT Individual Time Calculation (min): 60 min    Patient has met 6 of 8 long term goals due to improved activity tolerance, improved balance, improved postural control, increased strength, decreased pain, ability to compensate for deficits, functional use of  right lower extremity and left lower extremity and improved coordination.  Patient to discharge at a wheelchair level S, [place] for transfers and short distance gait with RW.   Patient's care partner is independent to provide the necessary physical assistance at discharge.  Reasons goals not met: Controlled environment gait goal adequate for d/c; goal set for 75' and pt ambulated maximum of 70'. Bed mobility requires minA for assisting LEs into bed for sit >supine; family able to provide this level assist.   Recommendation:  Patient will benefit from ongoing skilled PT services in home health setting to continue to advance safe functional mobility, address ongoing impairments in strength, balance, coordination, activity tolerance, and minimize fall risk.  Equipment: No equipment provided  Reasons for discharge: treatment goals met and discharge from hospital  Patient/family agrees with progress made and goals achieved: Yes  PT Discharge Precautions/Restrictions Precautions Precautions: Fall Precaution Booklet Issued: Yes (comment) Precaution Comments: Able to state 3/3 back precautions; maintains precautions during mobility with min cues Other Brace/Splint: No brace per MD order Restrictions Weight Bearing Restrictions: No Vital Signs Therapy Vitals Temp: 97.7 F (36.5 C) Temp Source: Oral Pulse Rate: 73 Resp: 18 BP: (!) 156/60 Patient Position (if appropriate): Sitting Oxygen Therapy SpO2: 99 % O2 Device: Not Delivered Pain Pain  Assessment Pain Assessment: No/denies pain Vision/Perception  Perception Perception: Within Functional Limits Praxis Praxis: Impaired Praxis Impairment Details: Motor planning  Cognition Overall Cognitive Status: Within Functional Limits for tasks assessed Arousal/Alertness: Awake/alert Orientation Level: Oriented X4 Memory: Appears intact Awareness: Appears intact Problem Solving: Appears intact Safety/Judgment: Appears intact Sensation Sensation Light Touch: Impaired Detail Light Touch Impaired Details: Impaired RLE;Impaired LLE Stereognosis: Not tested Hot/Cold: Impaired by gross assessment Proprioception: Impaired Detail Proprioception Impaired Details: Impaired RLE;Impaired LLE Coordination Gross Motor Movements are Fluid and Coordinated: No Fine Motor Movements are Fluid and Coordinated: Yes Motor  Motor Motor: Paraplegia;Ataxia  Mobility Bed Mobility Bed Mobility: Supine to Sit;Sit to Supine Supine to Sit: 6: Modified independent (Device/Increase time) Supine to Sit Details: Verbal cues for technique Sit to Supine: 4: Min assist Sit to Supine - Details: Manual facilitation for placement Transfers Transfers: Yes Sit to Stand: 5: Supervision Stand to Sit: 5: Supervision Stand to Sit Details (indicate cue type and reason): Verbal cues for precautions/safety;Verbal cues for technique Stand Pivot Transfers: 5: Supervision Stand Pivot Transfer Details: Verbal cues for technique;Verbal cues for precautions/safety;Verbal cues for safe use of DME/AE Locomotion  Ambulation Ambulation: Yes Ambulation/Gait Assistance: 4: Min guard;4: Min assist Ambulation Distance (Feet): 70 Feet Assistive device: Rolling walker;Other (Comment)(L knee brace) Ambulation/Gait Assistance Details: Verbal cues for precautions/safety;Verbal cues for technique Ambulation/Gait Assistance Details: cues for increased gait speed, smaller step length Gait Gait: Yes Gait Pattern: Impaired Gait  Pattern: Ataxic;Lateral hip instability;Poor foot clearance - right;Poor foot clearance - left Stairs / Additional Locomotion Stairs: (pt has performed ascent/descent 3" step in therapy sessions with modA; not assessed at d/c d/t L knee buckling/instability) Wheelchair Mobility Wheelchair Mobility: Yes Wheelchair Assistance: 6: Modified independent (Device/Increase time) Environmental health practitioner: Both upper extremities Wheelchair Parts Management: Independent Distance: 150'  Trunk/Postural  Assessment  Cervical Assessment Cervical Assessment: Within Functional Limits Thoracic Assessment Thoracic Assessment: Exceptions to WFL(back precautions) Lumbar Assessment Lumbar Assessment: Within Functional Limits Postural Control Postural Control: Within Functional Limits  Balance Balance Balance Assessed: Yes Dynamic Sitting Balance Sitting balance - Comments: Maintains sitting balance at EOB and on BSC at mod I  Static Standing Balance Static Standing - Balance Support: During functional activity Static Standing - Level of Assistance: 5: Stand by assistance Dynamic Standing Balance Dynamic Standing - Balance Support: During functional activity;Bilateral upper extremity supported Dynamic Standing - Level of Assistance: 4: Min assist Dynamic Standing - Balance Activities: Reaching for objects;Lateral lean/weight shifting Extremity Assessment  RUE Assessment RUE Assessment: Within Functional Limits LUE Assessment LUE Assessment: Within Functional Limits RLE Assessment RLE Assessment: Exceptions to [facility] RLE Strength Right Hip Flexion: 3/5 Right Hip Extension: 3/5 Right Hip ABduction: 3+/5 Right Hip ADduction: 3+/5 Right Knee Flexion: 4/5 Right Knee Extension: 4/5 Right Ankle Dorsiflexion: 3+/5 Right Ankle Plantar Flexion: 4/5 LLE Assessment LLE Assessment: Within Functional Limits(4+/5 throughout)  Skilled Therapeutic Intervention: Pt received seated in w/c, denies pain and agreeable  to treatment. Pt's sister and granddaughter present for session. Pt's family provided hands on assist for stand pivot transfers w/c <>bed/car, as well as bumping w/c up/down stairs to simulate home entry. Therapist assisted with bumping w/c up/down for safety d/t sister's concern of knee pain. Recommend third person to assist with bumping chair up/down at this time; reinforced this recommendation throughout session and provided handout with step by step instructions for bumping up/down stairs as well as a handout for building a ramp. Discussed recommendation that pt limit ambulation to short 5-10' distances as necessary only, and perform primarily stand pivot transfers at this time d/t instability and fluctuating energy levels especially with upcoming cancer treatment. Pt and family with no further questions/concerns regarding d/c at this time. Remained seated in w/c at end of session, all needs in reach.   See Function Navigator for Current Functional Status.  Benjiman Core Tygielski 01/07/2017, 12:52 PM

## 2017-01-07 NOTE — Progress Notes (Signed)
Recreational Therapy Discharge Summary Patient Details  Name: HAEVEN NICKLE MRN: [id number] Date of Birth: Jan 13, 1951 Today's Date: 01/07/2017  Comments on progress toward goals: Pt participated in community reintegration to [employer] during LOS at overall supervision w/c level.  Goals focused on safe community mobility and energy conservation techniques.  TR sessions also included activity analysis with potential adaptations.  Goal met. Pt is scheduled to discharge home tomorrow with family to provide supervision and physical assist.   Reasons for discharge: discharge from hospital  Patient/family agrees with progress made and goals achieved: Yes  Ceaira Ernster 01/07/2017, 2:55 PM

## 2017-01-07 NOTE — Progress Notes (Signed)
Occupational Therapy Note  Patient Details  Name: Katrina [name] MRN: [id number] Date of Birth: [birth year]  Today's Date: 01/07/2017 OT Individual Time: 1300-1350 OT Individual Time Calculation (min): 50 min   Pt denies pain Individual therapy  Pt resting in w/c upon arrival.  Pt requested use of toilet and completed transfer and toileting tasks at supervision level.  Pt propelled w/c to ADL apartment and engaged in simple kitchen tasks and home mgmt tasks at w/c level.  Focus on safety awareness, w/c mobility, and continued discharge planning.  Pt pleased with progress and looking forward to d/c tomorrow.    [name] [facility] 01/07/2017, 1:51 PM

## 2017-01-07 NOTE — Progress Notes (Signed)
Occupational Therapy Discharge Summary  Patient Details  Name: Katrina [name] MRN: [id number] Date of Birth: Feb 15, 1951  Patient has met 7 of 7 long term goals due to improved activity tolerance, improved balance, postural control, ability to compensate for deficits and functional use of  RIGHT lower and LEFT lower extremity.  Pt made steady progress with BADLs during this admission.  Pt completes bathing and dressing tasks at supervision level.  Pt completes functional transfers with RW and toileting tasks at supervision level.  Pt's sister and granddaughter have been present and actively participated in therapy. Patient to discharge at overall Supervision level.  Patient's care partner is independent to provide the necessary physical assistance at discharge.     Recommendation:  Patient will benefit from ongoing skilled OT services in home health setting to continue to advance functional skills in the area of BADL and Reduce care partner burden.  Equipment: No equipment provided Pt has access to tub transfer bench and BSC  Reasons for discharge: treatment goals met and discharge from hospital  Patient/family agrees with progress made and goals achieved: Yes  OT Discharge Vision Baseline Vision/History: Wears glasses Wears Glasses: Reading only Patient Visual Report: No change from baseline Vision Assessment?: No apparent visual deficits Perception  Perception: Within Functional Limits Praxis Praxis: Impaired Praxis Impairment Details: Motor planning Cognition Overall Cognitive Status: Within Functional Limits for tasks assessed Arousal/Alertness: Awake/alert Orientation Level: Oriented X4 Memory: Appears intact Awareness: Appears intact Problem Solving: Appears intact Safety/Judgment: Appears intact Sensation Sensation Light Touch: Impaired Detail Light Touch Impaired Details: Impaired RLE;Impaired LLE Stereognosis: Not tested Hot/Cold: Impaired by gross  assessment Proprioception: Impaired Detail Proprioception Impaired Details: Impaired RLE;Impaired LLE Coordination Gross Motor Movements are Fluid and Coordinated: No Fine Motor Movements are Fluid and Coordinated: Yes Motor  Motor Motor: Paraplegia;Ataxia     Trunk/Postural Assessment  Cervical Assessment Cervical Assessment: Within Functional Limits Thoracic Assessment Thoracic Assessment: Exceptions to WFL(back precautions) Lumbar Assessment Lumbar Assessment: Within Functional Limits Postural Control Postural Control: Within Functional Limits  Balance Dynamic Sitting Balance Sitting balance - Comments: Maintains sitting balance at EOB and on BSC at mod I  Extremity/Trunk Assessment RUE Assessment RUE Assessment: Within Functional Limits LUE Assessment LUE Assessment: Within Functional Limits   See Function Navigator for Current Functional Status.  [name] [facility] 01/07/2017, 12:21 PM

## 2017-01-08 DIAGNOSIS — E876 Hypokalemia: Secondary | ICD-10-CM

## 2017-01-08 NOTE — Progress Notes (Signed)
Patient and family discussed all the discharged instructions with Algis Liming PA before they left the hospital.

## 2017-01-08 NOTE — Progress Notes (Signed)
Subjective/Complaints: No new complaints. Slept well. Excited to go home today  ROS: pt denies nausea, vomiting, diarrhea, cough, shortness of breath or chest pain     Objective: Vital Signs: Blood pressure (!) 145/60, pulse 60, temperature (!) 97.5 F (36.4 C), temperature source Oral, resp. rate 18, weight 114 kg (251 lb 5.2 oz), SpO2 98 %. No results found. Results for orders placed or performed during the hospital encounter of 12/26/16 (from the past 72 hour(s))  Basic metabolic panel     Status: Abnormal   Collection Time: 01/06/17  2:45 PM  Result Value Ref Range   Sodium 129 (L) 135 - 145 mmol/L   Potassium 3.6 3.5 - 5.1 mmol/L   Chloride 96 (L) 101 - 111 mmol/L   CO2 24 22 - 32 mmol/L   Glucose, Bld 207 (H) 65 - 99 mg/dL   BUN 27 (H) 6 - 20 mg/dL   Creatinine, Ser 0.97 0.44 - 1.00 mg/dL   Calcium 8.9 8.9 - 10.3 mg/dL   GFR calc non Af Amer >60 >60 mL/min   GFR calc Af Amer >60 >60 mL/min    Comment: (NOTE) The eGFR has been calculated using the CKD EPI equation. This calculation has not been validated in all clinical situations. eGFR's persistently <60 mL/min signify possible Chronic Kidney Disease.    Anion gap 9 5 - 15  CBC with Differential/Platelet     Status: Abnormal   Collection Time: 01/06/17  2:45 PM  Result Value Ref Range   WBC 16.1 (H) 4.0 - 10.5 K/uL   RBC 4.31 3.87 - 5.11 MIL/uL   Hemoglobin 11.8 (L) 12.0 - 15.0 g/dL   HCT 35.3 (L) 36.0 - 46.0 %   MCV 81.9 78.0 - 100.0 fL   MCH 27.4 26.0 - 34.0 pg   MCHC 33.4 30.0 - 36.0 g/dL   RDW 15.1 11.5 - 15.5 %   Platelets 283 150 - 400 K/uL   Neutrophils Relative % 96 %   Neutro Abs 15.4 (H) 1.7 - 7.7 K/uL   Lymphocytes Relative 2 %   Lymphs Abs 0.4 (L) 0.7 - 4.0 K/uL   Monocytes Relative 2 %   Monocytes Absolute 0.3 0.1 - 1.0 K/uL   Eosinophils Relative 0 %   Eosinophils Absolute 0.0 0.0 - 0.7 K/uL   Basophils Relative 0 %   Basophils Absolute 0.0 0.0 - 0.1 K/uL  CBC with Differential/Platelet      Status: Abnormal   Collection Time: 01/07/17  5:25 AM  Result Value Ref Range   WBC 9.7 4.0 - 10.5 K/uL   RBC 4.04 3.87 - 5.11 MIL/uL   Hemoglobin 10.7 (L) 12.0 - 15.0 g/dL   HCT 32.9 (L) 36.0 - 46.0 %   MCV 81.4 78.0 - 100.0 fL   MCH 26.5 26.0 - 34.0 pg   MCHC 32.5 30.0 - 36.0 g/dL   RDW 15.2 11.5 - 15.5 %   Platelets 217 150 - 400 K/uL   Neutrophils Relative % 77 %   Neutro Abs 7.5 1.7 - 7.7 K/uL   Lymphocytes Relative 15 %   Lymphs Abs 1.4 0.7 - 4.0 K/uL   Monocytes Relative 8 %   Monocytes Absolute 0.8 0.1 - 1.0 K/uL   Eosinophils Relative 0 %   Eosinophils Absolute 0.0 0.0 - 0.7 K/uL   Basophils Relative 0 %   Basophils Absolute 0.0 0.0 - 0.1 K/uL  Comprehensive metabolic panel     Status: Abnormal   Collection Time: 01/07/17  5:25 AM  Result Value Ref Range   Sodium 130 (L) 135 - 145 mmol/L   Potassium 3.3 (L) 3.5 - 5.1 mmol/L   Chloride 95 (L) 101 - 111 mmol/L   CO2 25 22 - 32 mmol/L   Glucose, Bld 93 65 - 99 mg/dL   BUN 25 (H) 6 - 20 mg/dL   Creatinine, Ser 0.83 0.44 - 1.00 mg/dL   Calcium 8.7 (L) 8.9 - 10.3 mg/dL   Total Protein 5.8 (L) 6.5 - 8.1 g/dL   Albumin 3.3 (L) 3.5 - 5.0 g/dL   AST 20 15 - 41 U/L   ALT 19 14 - 54 U/L   Alkaline Phosphatase 61 38 - 126 U/L   Total Bilirubin 0.6 0.3 - 1.2 mg/dL   GFR calc non Af Amer >60 >60 mL/min   GFR calc Af Amer >60 >60 mL/min    Comment: (NOTE) The eGFR has been calculated using the CKD EPI equation. This calculation has not been validated in all clinical situations. eGFR's persistently <60 mL/min signify possible Chronic Kidney Disease.    Anion gap 10 5 - 15  Uric acid     Status: None   Collection Time: 01/07/17  5:25 AM  Result Value Ref Range   Uric Acid, Serum 5.9 2.3 - 6.6 mg/dL     HEENT: Normocephalic, atraumatic. Cardio: RRR without murmur. No JVD     Resp: CTA Bilaterally without wheezes or rales. Normal effort  . GI:  BS +, non-tender, non-distended  Skin:   Wound C/D/I  Neuro:  Alert/Oriented Motor 5/5 bilateral deltoid, bicep, tricep, grip 4+/5 in b/l hip flexor, knee extensors, ankle dorsi/plantar flexors (stable) -Decreased proprioception in both lower extremities Musc/Skel: No edema.  No tenderness. Psych: pleasant.  Normal mood and behavior.   Assessment/Plan: 1. 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 Physiatrist is providing close team supervision and 24 hour management of active medical problems listed below. Physiatrist and rehab team continue to assess barriers to discharge/monitor patient progress toward functional and medical goals. FIM: Function - Bathing Position: Shower Body parts bathed by patient: Right arm, Left arm, Chest, Right upper leg, Left upper leg, Abdomen, Front perineal area, Buttocks, Right lower leg, Left lower leg Body parts bathed by helper: Right lower leg, Left lower leg Bathing not applicable: Back Assist Level: Supervision or verbal cues Assistive Device Comment: LH sponge  Function- Upper Body Dressing/Undressing What is the patient wearing?: Bra, Pull over shirt/dress Bra - Perfomed by patient: Thread/unthread left bra strap, Thread/unthread right bra strap, Hook/unhook bra (pull down sports bra) Pull over shirt/dress - Perfomed by patient: Thread/unthread right sleeve, Thread/unthread left sleeve, Put head through opening, Pull shirt over trunk Pull over shirt/dress - Perfomed by helper: Pull shirt over trunk Assist Level: No help, No cues Set up : To obtain clothing/put away Function - Lower Body Dressing/Undressing What is the patient wearing?: Pants, Non-skid slipper socks Position: Wheelchair/chair at sink Pants- Performed by patient: Thread/unthread right pants leg, Thread/unthread left pants leg, Pull pants up/down Pants- Performed by helper: Thread/unthread left pants  leg Non-skid slipper socks- Performed by patient: Don/doff right sock, Don/doff left sock Non-skid slipper socks- Performed by helper: Don/doff right sock, Don/doff left sock Assist for footwear: Independent Assist for lower body dressing: Supervision or verbal cues Assistive Device Comment: reacher, sock aide  Function - Toileting Toileting steps completed by patient:  Adjust clothing prior to toileting, Performs perineal hygiene, Adjust clothing after toileting Toileting steps completed by helper: Adjust clothing prior to toileting, Adjust clothing after toileting Toileting Assistive Devices: Grab bar or rail Assist level: Touching or steadying assistance (Pt.75%)  Function - Toilet Transfers Toilet transfer assistive device: Elevated toilet seat/BSC over toilet, Grab bar Assist level to toilet: Supervision or verbal cues Assist level from toilet: Supervision or verbal cues Assist level to bedside commode (at bedside): Touching or steadying assistance (Pt > 75%) Assist level from bedside commode (at bedside): Touching or steadying assistance (Pt > 75%)  Function - Chair/bed transfer Chair/bed transfer method: Stand pivot Chair/bed transfer assist level: Supervision or verbal cues Chair/bed transfer assistive device: Armrests, Walker Chair/bed transfer details: Verbal cues for precautions/safety, Verbal cues for safe use of DME/AE, Verbal cues for sequencing  Function - Locomotion: Wheelchair Will patient use wheelchair at discharge?: Yes Type: Manual Max wheelchair distance: 160 Assist Level: No help, No cues, assistive device, takes more than reasonable amount of time Assist Level: No help, No cues, assistive device, takes more than reasonable amount of time Wheel 150 feet activity did not occur: Safety/medical concerns(fatigue) Assist Level: No help, No cues, assistive device, takes more than reasonable amount of time Turns around,maneuvers to table,bed, and toilet,negotiates 3%  grade,maneuvers on rugs and over doorsills: Yes Function - Locomotion: Ambulation Assistive device: Walker-rolling, Other (comment)(L knee brace) Max distance: 70 Assist level: Touching or steadying assistance (Pt > 75%) Assist level: Touching or steadying assistance (Pt > 75%) Walk 50 feet with 2 turns activity did not occur: Safety/medical concerns Assist level: Touching or steadying assistance (Pt > 75%) Walk 150 feet activity did not occur: Safety/medical concerns Walk 10 feet on uneven surfaces activity did not occur: Safety/medical concerns  Function - Comprehension Comprehension: Auditory Comprehension assist level: Follows complex conversation/direction with no assist  Function - Expression Expression: Verbal Expression assist level: Expresses complex ideas: With no assist  Function - Social Interaction Social Interaction assist level: Interacts appropriately with others - No medications needed.  Function - Problem Solving Problem solving assist level: Solves complex problems: Recognizes & self-corrects  Function - Memory Memory assist level: Complete Independence: No helper Patient normally able to recall (first 3 days only): Current season, Location of own room, Staff names and faces, That he or she is in a hospital   Medical Problem List and Plan: 1. 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   -CTX to begin 11/26 2. DVT Prophylaxis/Anticoagulation: Pharmaceutical:Lovenoxadded. Dopplers negative  3. Pain Management:oxycodone prn effective, continue 4. Mood:LCSW to follow for evaluation and support. Reports that she is managing to deal with diagnosis 5. Neuropsych: This patientiscapable of making decisions on herown behalf. 6. Skin/Wound Care:routine pressure relief measures 7.  Fluids/Electrolytes/Nutrition:Monitor I/O.    Encourage fluid  -Potassium supplementation for hypokalemia 8.HTN: Monitor BP bid. Continue Norvasc and dyazide.    -  Lisinopril 2.5 mg started on 11/17 with improvement--I am going to stop and observe off medication. Not sure it's indicated especially at such a low dose, trying to simplify meds Vitals:   01/07/17 1441 01/08/17 0534  BP: (!) 156/60 (!) 145/60  Pulse: 73 60  Resp: 18 18  Temp: 97.7 F (36.5 C) (!) 97.5 F (36.4 C)  SpO2: 99% 98%    Improving fair control at present 9. ABLA: on iron.  Hemoglobin 10.7    Continue supplements 10. Leucocytosis: Likely reactive due to steroids. Monitor for signs of infection.   Afebrile  -White blood cells down to 9.7 today 11. AKI: Due to dehydration . Encourage fluid intake.    -Renal function nearly normal.  Continue to push fluids 12. Hyponatremia:      -Labs generally stable  at 130.  Follow up as outpt  LOS (Days) 13 A FACE TO FACE EVALUATION WAS PERFORMED  , T 01/08/2017, 8:24 AM

## 2017-01-08 NOTE — Discharge Instructions (Signed)
Inpatient Rehab Discharge Instructions  YARETHZI BRANAN Discharge date and time:  01/07/17  Activities/Precautions/ Functional Status: Activity: activity as tolerated and no driving till cleared by MD Diet: regular diet Wound Care: keep wound clean and dry   Functional status:  ___ No restrictions     ___ Walk up steps independently _X__ 24/7 supervision/assistance   ___ Walk up steps with assistance ___ Intermittent supervision/assistance  ___ Bathe/dress independently ___ Walk with walker     _X__ Bathe/dress with assistance ___ Walk Independently    ___ Shower independently ___ Walk with assistance    ___ Shower with assistance _X__ No alcohol     ___ Return to work/school ________   COMMUNITY REFERRALS UPON DISCHARGE:    Home Health:   PT     OT                      Agency:  [facility] Phone: [phone]    Special Instructions: 1. Needs assistance with transfers.    My questions have been answered and I understand these instructions. I will adhere to these goals and the provided educational materials after my discharge from the hospital.  Patient/Caregiver Signature _______________________________ Date __________  Clinician Signature _______________________________________ Date __________  Please bring this form and your medication list with you to all your follow-up doctor's appointments.

## 2017-01-08 NOTE — Progress Notes (Signed)
Social Work  Discharge Note  The overall goal for the admission was met for:   Discharge location: Yes - d/c home with son.  Son and other family to provide 24/7 assistance  Length of Stay: Yes - 13 days  Discharge activity level: Yes - supervision overall  Home/community participation: Yes  Services provided included: MD, RD, PT, OT, RN, TR, Pharmacy, [place]: [facility] Medicare  Follow-up services arranged: Home Health: PT, OT via [facility], DME: cushion for w/c via [facility] and Patient/Family has no preference for HH/DME agencies  Comments (or additional information):  Patient/Family verbalized understanding of follow-up arrangements: Yes  Individual responsible for coordination of the follow-up plan: pt  Confirmed correct DME delivered: Tris Howell 01/08/2017    Luvern Mcisaac

## 2017-01-13 ENCOUNTER — Inpatient Hospital Stay (HOSPITAL_COMMUNITY)
Admission: AD | Admit: 2017-01-13 | Discharge: 2017-01-17 | DRG: 847 | Disposition: A | Discharge status: Home Health | Payer: Medicare Other | Source: Ambulatory Visit | Attending: Hematology and Oncology | Admitting: Hematology and Oncology

## 2017-01-13 ENCOUNTER — Other Ambulatory Visit: Payer: Self-pay

## 2017-01-13 ENCOUNTER — Encounter (HOSPITAL_COMMUNITY): Payer: Self-pay

## 2017-01-13 DIAGNOSIS — C8330 Diffuse large B-cell lymphoma, unspecified site: Secondary | ICD-10-CM | POA: Diagnosis present

## 2017-01-13 DIAGNOSIS — Z5111 Encounter for antineoplastic chemotherapy: Principal | ICD-10-CM

## 2017-01-13 DIAGNOSIS — E876 Hypokalemia: Secondary | ICD-10-CM | POA: Diagnosis present

## 2017-01-13 DIAGNOSIS — E871 Hypo-osmolality and hyponatremia: Secondary | ICD-10-CM | POA: Diagnosis present

## 2017-01-13 DIAGNOSIS — I10 Essential (primary) hypertension: Secondary | ICD-10-CM | POA: Diagnosis present

## 2017-01-13 DIAGNOSIS — K5900 Constipation, unspecified: Secondary | ICD-10-CM | POA: Diagnosis present

## 2017-01-13 DIAGNOSIS — D638 Anemia in other chronic diseases classified elsewhere: Secondary | ICD-10-CM | POA: Diagnosis present

## 2017-01-13 DIAGNOSIS — K5909 Other constipation: Secondary | ICD-10-CM

## 2017-01-13 DIAGNOSIS — D61818 Other pancytopenia: Secondary | ICD-10-CM | POA: Diagnosis present

## 2017-01-13 DIAGNOSIS — G8220 Paraplegia, unspecified: Secondary | ICD-10-CM | POA: Diagnosis present

## 2017-01-13 DIAGNOSIS — M549 Dorsalgia, unspecified: Secondary | ICD-10-CM | POA: Diagnosis not present

## 2017-01-13 DIAGNOSIS — Z9889 Other specified postprocedural states: Secondary | ICD-10-CM

## 2017-01-13 MED ORDER — ALTEPLASE 2 MG IJ SOLR: 2.0000 mg | Freq: Once | INTRAMUSCULAR | Status: DC | PRN

## 2017-01-13 MED ORDER — VINCRISTINE SULFATE CHEMO INJECTION 1 MG/ML
Freq: Once | INTRAVENOUS | Status: AC
  Administered 2017-01-13: 12:00:00 via INTRAVENOUS
  Filled 2017-01-13: qty 12

## 2017-01-13 MED ORDER — ALLOPURINOL 300 MG PO TABS
300.0000 mg | ORAL_TABLET | Freq: Every day | ORAL | Status: DC
  Administered 2017-01-13 – 2017-01-17 (×5): 300 mg via ORAL
  Filled 2017-01-13 (×5): qty 1

## 2017-01-13 MED ORDER — ACETAMINOPHEN 325 MG PO TABS: 650.0000 mg | ORAL_TABLET | ORAL | Status: DC | PRN

## 2017-01-13 MED ORDER — TRAMADOL HCL 50 MG PO TABS
50.0000 mg | ORAL_TABLET | Freq: Four times a day (QID) | ORAL | Status: DC | PRN
Start: 2017-01-13 — End: 2017-01-17
  Administered 2017-01-13 – 2017-01-16 (×4): 50 mg via ORAL
  Filled 2017-01-13 (×4): qty 1

## 2017-01-13 MED ORDER — HEPARIN SOD (PORK) LOCK FLUSH 100 UNIT/ML IV SOLN: 250.0000 [IU] | Freq: Once | INTRAVENOUS | Status: DC | PRN

## 2017-01-13 MED ORDER — CYCLOBENZAPRINE HCL 10 MG PO TABS
10.0000 mg | ORAL_TABLET | Freq: Three times a day (TID) | ORAL | Status: DC | PRN
  Administered 2017-01-14 – 2017-01-17 (×4): 10 mg via ORAL
  Filled 2017-01-13 (×4): qty 1

## 2017-01-13 MED ORDER — COLD PACK MISC ONCOLOGY: 1.0000 | Freq: Once | Status: DC | PRN

## 2017-01-13 MED ORDER — DOCUSATE SODIUM 100 MG PO CAPS
100.0000 mg | ORAL_CAPSULE | Freq: Two times a day (BID) | ORAL | Status: DC
  Administered 2017-01-13 – 2017-01-17 (×9): 100 mg via ORAL
  Filled 2017-01-13 (×9): qty 1

## 2017-01-13 MED ORDER — ONDANSETRON HCL 4 MG/2ML IJ SOLN: 4.0000 mg | Freq: Three times a day (TID) | INTRAMUSCULAR | Status: DC | PRN

## 2017-01-13 MED ORDER — SODIUM CHLORIDE 0.9% FLUSH: 10.0000 mL | INTRAVENOUS | Status: DC | PRN

## 2017-01-13 MED ORDER — SODIUM CHLORIDE 0.9% FLUSH: 3.0000 mL | INTRAVENOUS | Status: DC | PRN

## 2017-01-13 MED ORDER — HEPARIN SOD (PORK) LOCK FLUSH 100 UNIT/ML IV SOLN: 500.0000 [IU] | Freq: Once | INTRAVENOUS | Status: DC | PRN

## 2017-01-13 MED ORDER — MORPHINE SULFATE (PF) 2 MG/ML IV SOLN: 2.0000 mg | INTRAVENOUS | Status: DC | PRN

## 2017-01-13 MED ORDER — ONDANSETRON HCL 4 MG PO TABS: 4.0000 mg | ORAL_TABLET | Freq: Three times a day (TID) | ORAL | Status: DC | PRN

## 2017-01-13 MED ORDER — SENNOSIDES-DOCUSATE SODIUM 8.6-50 MG PO TABS
1.0000 | ORAL_TABLET | Freq: Every evening | ORAL | Status: DC | PRN
  Administered 2017-01-15: 1 via ORAL
  Filled 2017-01-13: qty 1

## 2017-01-13 MED ORDER — LIDOCAINE-PRILOCAINE 2.5-2.5 % EX CREA
TOPICAL_CREAM | CUTANEOUS | Status: DC | PRN
  Filled 2017-01-13: qty 5

## 2017-01-13 MED ORDER — ONDANSETRON 4 MG PO TBDP: 4.0000 mg | ORAL_TABLET | Freq: Three times a day (TID) | ORAL | Status: DC | PRN

## 2017-01-13 MED ORDER — SODIUM CHLORIDE 0.9 % IV SOLN
8.0000 mg | Freq: Three times a day (TID) | INTRAVENOUS | Status: DC | PRN
Start: 2017-01-13 — End: 2017-01-17
  Filled 2017-01-13: qty 4

## 2017-01-13 MED ORDER — ENOXAPARIN SODIUM 40 MG/0.4ML ~~LOC~~ SOLN
40.0000 mg | SUBCUTANEOUS | Status: AC
  Administered 2017-01-13 – 2017-01-14 (×2): 40 mg via SUBCUTANEOUS
  Filled 2017-01-13 (×2): qty 0.4

## 2017-01-13 MED ORDER — SODIUM CHLORIDE 0.9 % IV SOLN: INTRAVENOUS | Status: DC

## 2017-01-13 MED ORDER — SODIUM CHLORIDE 0.9 % IV SOLN
INTRAVENOUS | Status: DC
Start: 2017-01-13 — End: 2017-01-17
  Administered 2017-01-13 – 2017-01-14 (×3): via INTRAVENOUS

## 2017-01-13 MED ORDER — AMLODIPINE BESYLATE 10 MG PO TABS
10.0000 mg | ORAL_TABLET | Freq: Every day | ORAL | Status: DC
  Administered 2017-01-13 – 2017-01-17 (×5): 10 mg via ORAL
  Filled 2017-01-13 (×5): qty 1

## 2017-01-13 MED ORDER — HOT PACK MISC ONCOLOGY
1.0000 | Freq: Once | Status: DC | PRN
Start: 2017-01-13 — End: 2017-01-17

## 2017-01-13 MED ORDER — SODIUM CHLORIDE 0.9 % IV SOLN
Freq: Once | INTRAVENOUS | Status: AC
  Administered 2017-01-13: 8 mg via INTRAVENOUS
  Filled 2017-01-13: qty 4

## 2017-01-13 MED ORDER — ZOLPIDEM TARTRATE 5 MG PO TABS
5.0000 mg | ORAL_TABLET | Freq: Every evening | ORAL | Status: DC | PRN
  Administered 2017-01-15: 5 mg via ORAL
  Filled 2017-01-13: qty 1

## 2017-01-13 MED ORDER — ACYCLOVIR 400 MG PO TABS
400.0000 mg | ORAL_TABLET | Freq: Two times a day (BID) | ORAL | Status: DC
  Administered 2017-01-13 – 2017-01-17 (×9): 400 mg via ORAL
  Filled 2017-01-13 (×9): qty 1

## 2017-01-13 NOTE — Progress Notes (Signed)
Dosages and dilutions for Doxorubicin, Etoposide, and Oncovin verified with Reyne Dumas, RN.

## 2017-01-13 NOTE — Progress Notes (Signed)
Per [facility] rep, pt is currently active with [facility] for HHPT/OT. Will need resumption orders at DC to resume home health services at discharge. Marney Doctor RN,BSN,NCM [phone]

## 2017-01-13 NOTE — Evaluation (Signed)
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   Clinical Impression  The patient is very unsteady for ambulation with RW due to ataxia and decreased [roprioception and sensation. The patient reports that she uses the [facility] mostly  And ambulates with assistance  PTA. Pt admitted with above diagnosis. Pt currently with functional limitations due to the deficits listed below (see PT Problem List).  Pt will benefit from skilled PT to increase their independence and safety with mobility to allow discharge to the venue listed below.       Follow Up Recommendations Home health PT    Equipment Recommendations  None recommended by PT    Recommendations for Other Services       Precautions / Restrictions Precautions Precautions: Fall Required Braces or Orthoses: Other Brace/Splint Other Brace/Splint: L knee brace to control  knee hyperextension       Mobility  Bed Mobility Overal bed mobility: Needs Assistance Bed Mobility: Rolling;Sidelying to Sit Rolling: Supervision Sidelying to sit: Supervision          Transfers Overall transfer level: Needs assistance Equipment used: Rolling walker (2 wheeled) Transfers: Sit to/from Stand Sit to Stand: Min assist;From elevated surface         General transfer comment: from [facility] and from bed raised, Steady assist  upon standing. noted decreased proprioception, ataxia.  Ambulation/Gait Ambulation/Gait assistance: Mod assist Ambulation Distance (Feet): 12 Feet(x  2) Assistive device: Rolling walker (2 wheeled) Gait Pattern/deviations: Step-through pattern;Ataxic;Decreased dorsiflexion - right;Decreased dorsiflexion  - left;Trunk flexed;Narrow base of support Gait velocity: significantly decreased   General Gait Details: modA  for gait and steadying with RW,  vc for sequencing and for achieving balance before starting next step, noted to  nearly step on opposite foot, especially sock that was sliding off due to decreased foot clearance. Patient must look at feet.  Stairs            Wheelchair Mobility    Modified Rankin (Stroke Patients Only)       Balance Overall balance assessment: Needs assistance Sitting-balance support: Feet supported;No upper extremity supported Sitting balance-Leahy Scale: Good Sitting balance - Comments: Maintains sitting balance at EOB and on BSC at mod I    Standing balance support: Bilateral upper extremity supported;During functional activity Standing balance-Leahy Scale: Poor Standing balance comment: Reliant on UE support                             Pertinent Vitals/Pain Pain Assessment: No/denies pain    Home Living Family/patient expects to be discharged to:: Private residence Living Arrangements: Alone Available Help at Discharge: Family Type of Home: House Home Access: Stairs to enter   [employer] of Steps: 3 in the front and in the back - plan on building a ramp on the back Home Layout: One level Home Equipment: Tub bench Additional Comments: Been staying at her son's house since 12/14/16, up the steps with WC-    Prior Function Level of Independence: Needs assistance   Gait / Transfers Assistance Needed: walks with RW a short distance with assist. WC does not fit into BR so has to walk in  Hand Dominance        Extremity/Trunk Assessment   Upper Extremity Assessment Upper Extremity Assessment: Overall WFL for tasks assessed    Lower Extremity Assessment Lower Extremity Assessment: RLE deficits/detail;LLE deficits/detail RLE Deficits / Details: pt with decreased sensation to light touch  and  proprioception throughout and decreased strength. MMT revealed 3-/5 for knee flexion, knee extension and ankle DF. feells tingling RLE Sensation: decreased proprioception LLE Deficits / Details: grossly 3-/5 LLE Sensation: decreased light touch;decreased proprioception    Cervical / Trunk Assessment Cervical / Trunk Exceptions: s/p back surgery  Communication      Cognition Arousal/Alertness: Awake/alert Behavior During Therapy: WFL for tasks assessed/performed Overall Cognitive Status: Within Functional Limits for tasks assessed                                        General Comments      Exercises     Assessment/Plan    PT Assessment Patient needs continued PT services  PT Problem List Decreased range of motion;Decreased strength;Decreased activity tolerance;Decreased balance;Decreased coordination;Decreased mobility;Decreased knowledge of use of DME;Decreased safety awareness;Decreased knowledge of precautions;Pain       PT Treatment Interventions DME instruction;Gait training;Functional mobility training;Therapeutic activities;Therapeutic exercise;Wheelchair mobility training;Patient/family education    PT Goals (Current goals can be found in the Care Plan section)  Acute Rehab PT Goals Patient Stated Goal: to go back home, walk more PT Goal Formulation: With patient Time For Goal Achievement: 01/20/17    Frequency Min 2X/week   Barriers to discharge        Co-evaluation               AM-PAC PT "6 Clicks" Daily Activity  Outcome Measure Difficulty turning over in bed (including adjusting bedclothes, sheets and blankets)?: A Little Difficulty moving from lying on back to sitting on the side of the bed? : A Little Difficulty sitting down on and standing up from a chair with arms (e.g., wheelchair, bedside commode, etc,.)?: Unable Help needed moving to and from a bed to chair (including a wheelchair)?: Total Help needed walking in hospital  room?: Total Help needed climbing 3-5 steps with a railing? : Total 6 Click Score: 10    End of Session Equipment Utilized During Treatment: Gait belt Activity Tolerance: Patient tolerated treatment well Patient left: in chair;with call bell/phone within reach Nurse Communication: Mobility status PT Visit Diagnosis: Other abnormalities of gait and mobility (R26.89);Unsteadiness on feet (R26.81);Muscle weakness (generalized) (M62.81);Ataxic gait (R26.0)    Time: 2010-0712 PT Time Calculation (min) (ACUTE ONLY): 30 min   Charges:   PT Evaluation $PT Eval Low Complexity: 1 Low PT Treatments $Gait Training: 8-22 mins   PT G CodesTresa Endo PT [phone]    Claretha Cooper 01/13/2017, 3:03 PM

## 2017-01-13 NOTE — H&P (Signed)
[facility] ADMISSION NOTE  Patient Care Team: Muse, Rochelle D., PA-C as PCP - General  CHIEF COMPLAINTS/PURPOSE OF ADMISSION Diffuse large B cell lymphoma, for first dose chemotherapy  HISTORY OF PRESENTING ILLNESS:  Katrina [name] 66 y.o. female is admitted for high dose, inpatient chemotherapy Summary of oncologic history as follows:   Diffuse large B cell lymphoma (HCC)   12/12/2016 Imaging    CT scan showed  1. Extensive mesenteric, retroperitoneal, left hilar, mediastinal, left supraclavicular and left axillary adenopathy. Differential considerations include metastatic adenopathy, lymphoma and leukemia. The size of the nodes in the mesentery and retroperitoneum are suggestive of non-Hodgkin's lymphoma. 2. 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 Differential considerations include metastatic disease and lymphoma. A left upper lobe primary lung carcinoma is a possibility. 3. 3.2 cm solid right renal mass. Differential considerations include renal cell carcinoma and oncocytoma. 4. Small amount of free peritoneal fluid, most likely due to lymphatic obstruction by the mesenteric adenopathy. 5. Left fourth rib fracture. 6. Colonic diverticulosis. 7. Calcific coronary artery and aortic atherosclerosis. Aortic Atherosclerosis (ICD10-I70.0).       12/12/2016 - 12/14/2016 Hospital Admission    He was briefly admitted and evaluated for weakness.  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 Tumor fills the right neural foramina at T6-7 and T7-8 and completely replaces the T7 vertebral body.      12/18/2016 Surgery     She underwent procedure: Decompressive thoracic laminectomy from T5-T8 for resection of epidural tumor        12/18/2016 Pathology Results    1. Soft tissue mass, simple excision, thoracic dorsal epidural mass - DIFFUSE LARGE B-CELL LYMPHOMA. - SEE ONCOLOGY TABLE. 2. Soft tissue mass, simple excision, thoracic dorsal epidural mass - DIFFUSE LARGE B-CELL LYMPHOMA. Microscopic Comment 1. LYMPHOMA Histologic type: Non-Hodgkin B-cell lymphoma: Diffuse large B-cell lymphoma. Grade (if applicable): High grade. Flow cytometry: N/A. Immunohistochemical stains: CD20. CD3, CD5, CD10, CD23, CD30, CD15, PAX-5, Ki-67, bcl-2, bcl-6, CD34. Touch preps/imprints: N/A. Comments: There is a diffuse infiltrate of atypical lymphocytes. The lymphocytes are medium to large in size with irregular nuclear contours. There are background smaller lymphocytes. While there are admixed smaller cells, there are diffuse areas of large cells. The large lymphocytes are positive for CD20, CD10, bcl-6, and bcl-2. CD3 and CD5 highlight admixed T-cells. CD34, CD30, CD23, and CD15 are negative. PAX-5 is positive. Ki-67 is variable with areas up to 70%. 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       12/18/2016 -  Hospital Admission    The patient was readmitted to the hospital due to complete weakness secondary to spinal cord compression.  She underwent surgery and pathology report confirmed diagnosis of diffuse large B-cell lymphoma.       MEDICAL HISTORY:  Past Medical History:  Diagnosis Date  . Anemia   . Arthritis   . 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 Hypertension   . Lymphadenopathy 11/2016   "extensive"  . Paresthesia of lower extremity 11/2016     bilateral  . Pulmonary nodules 11/2016  . Right renal mass 11/2016    SURGICAL HISTORY: Past  Surgical History:  Procedure Laterality Date  . ABDOMINAL HYSTERECTOMY    . BREAST LUMPECTOMY     left  . COLONOSCOPY N/A 11/02/2013   Procedure: COLONOSCOPY;  Surgeon: Sandi L Fields, MD;  Location: [facility] SUITE;  Service: Endoscopy;  Laterality: N/A;  1:15 - moved to 845 - Kim notified pt  . IR FLUORO GUIDE PORT INSERTION RIGHT  12/30/2016  . IR US GUIDE VASC ACCESS RIGHT  12/30/2016  . LAMINECTOMY N/A 12/18/2016   Procedure: DECOMPRESSION T5-8/THORACIC;  Surgeon: Cram, Gary, MD;  Location: [facility] OR;  Service: Neurosurgery;  Laterality: N/A;  Decompressive Thoracic Laminectomy, Thoracic five - six, Thoraic six - seven, Thoracic seven - eight  . SHOULDER SURGERY     right   Since recent discharge, she feels well She has minimal back pain No changes in bowel habits She continues to have mild weakness of her lower extremity No recent falls  SOCIAL HISTORY: Social History   Socioeconomic History  . Marital status: Widowed    Spouse name: Not on file  . Number of children: Not on file  . Years of education: Not on file  . Highest education level: Not on file  Social Needs  . Financial resource strain: Not on file  . Food insecurity - worry: Not on file  . Food insecurity - inability: Not on file  . Transportation needs - medical: Not on file  . Transportation needs - non-medical: Not on file  Occupational History  . Not on file  Tobacco Use  . Smoking status: Never Smoker  . Smokeless tobacco: Never Used  Substance and Sexual Activity  . Alcohol use: No  . Drug use: No  . Sexual activity: No  Other Topics Concern  . Not on file  Social History Narrative  . Not on file    FAMILY HISTORY: Family History  Problem Relation Age of Onset  . Colon cancer Sister        at age 64  . Colon cancer Brother        at age 51  . COPD Mother   . Diabetes Mellitus II Mother   . CAD Mother        Angina  . Colon cancer Father   . Thyroid disease Sister   . Diabetes Mellitus II  Sister     ALLERGIES:  is allergic to contrast media [iodinated diagnostic agents].  MEDICATIONS:  Current Facility-Administered Medications  Medication Dose Route Frequency Provider Last Rate Last Dose  . 0.9 %  sodium chloride infusion   Intravenous Continuous , , MD      . 0.9 %  sodium chloride infusion   Intravenous Continuous , , MD 50 mL/hr at 01/13/17 1012    . acetaminophen (TYLENOL) tablet 650 mg  650 mg Oral Q4H PRN , , MD      . acyclovir (ZOVIRAX) tablet 400 mg  400 mg Oral BID , , MD   400 mg at 01/13/17 1011  . allopurinol (ZYLOPRIM) tablet 300 mg  300 mg Oral Daily , , MD   300 mg at 01/13/17 1011  . alteplase (CATHFLO ACTIVASE) injection 2 mg  2 mg Intracatheter Once PRN , , MD      . amLODipine (NORVASC) tablet 10 mg  10 mg Oral Daily , , MD   10 mg at 01/13/17 1011  . Cold Pack 1   packet  1 packet Topical Once PRN , , MD      . cyclobenzaprine (FLEXERIL) tablet 10 mg  10 mg Oral TID PRN , , MD      . docusate sodium (COLACE) capsule 100 mg  100 mg Oral BID , , MD   100 mg at 01/13/17 1011  . DOXOrubicin (ADRIAMYCIN) 24 mg, etoposide (VEPESID) 118 mg, vinCRIStine (ONCOVIN) 0.9 mg in sodium chloride 0.9 % 600 mL chemo infusion   Intravenous Once , , MD      . enoxaparin (LOVENOX) injection 40 mg  40 mg Subcutaneous Q24H , , MD   40 mg at 01/13/17 1011  . heparin lock flush 100 unit/mL  500 Units Intracatheter Once PRN , , MD      . heparin lock flush 100 unit/mL  250 Units Intracatheter Once PRN , , MD      . Hot Pack 1 packet  1 packet Topical Once PRN , , MD      . lidocaine-prilocaine (EMLA) cream   Topical PRN , , MD      . morphine 2 MG/ML injection 2 mg  2 mg Intravenous Q2H PRN , , MD      . ondansetron (ZOFRAN) tablet 4-8 mg  4-8 mg Oral Q8H PRN , , MD       Or  . ondansetron (ZOFRAN-ODT) disintegrating  tablet 4-8 mg  4-8 mg Oral Q8H PRN , , MD       Or  . ondansetron (ZOFRAN) injection 4 mg  4 mg Intravenous Q8H PRN , , MD       Or  . ondansetron (ZOFRAN) 8 mg in sodium chloride 0.9 % 50 mL IVPB  8 mg Intravenous Q8H PRN , , MD      . ondansetron (ZOFRAN) 8 mg, dexamethasone (DECADRON) 10 mg in sodium chloride 0.9 % 50 mL IVPB   Intravenous Once , , MD      . senna-docusate (Senokot-S) tablet 1 tablet  1 tablet Oral QHS PRN , , MD      . sodium chloride flush (NS) 0.9 % injection 10 mL  10 mL Intracatheter PRN , , MD      . sodium chloride flush (NS) 0.9 % injection 3 mL  3 mL Intravenous PRN , , MD      . traMADol (ULTRAM) tablet 50 mg  50 mg Oral Q6H PRN , , MD        REVIEW OF SYSTEMS:   Constitutional: Denies fevers, chills or abnormal night sweats Eyes: Denies blurriness of vision, double vision or watery eyes Ears, nose, mouth, throat, and face: Denies mucositis or sore throat Respiratory: Denies cough, dyspnea or wheezes Cardiovascular: Denies palpitation, chest discomfort or lower extremity swelling Gastrointestinal:  Denies nausea, heartburn or change in bowel habits Skin: Denies abnormal skin rashes Lymphatics: Denies new lymphadenopathy or easy bruising Behavioral/Psych: Mood is stable, no new changes  All other systems were reviewed with the patient and are negative.  PHYSICAL EXAMINATION: ECOG PERFORMANCE STATUS: 1 - Symptomatic but completely ambulatory  Vitals:   01/13/17 0813  BP: (!) 171/95  Pulse: (!) 110  Resp: 18  Temp: 98.5 F (36.9 C)  SpO2: 100%   Filed Weights   01/13/17 0813  Weight: 227 lb 12.8 oz (103.3 kg)    GENERAL:alert, no distress and comfortable SKIN: skin color, texture, turgor are normal, no rashes or significant lesions EYES: normal, conjunctiva are pink and non-injected, sclera clear OROPHARYNX:no exudate,   no erythema and lips, buccal mucosa, and tongue normal   NECK: supple, thyroid normal size, non-tender, without nodularity LYMPH:  no palpable lymphadenopathy in the cervical, axillary or inguinal LUNGS: clear to auscultation and percussion with normal breathing effort HEART: regular rate & rhythm and no murmurs and no lower extremity edema ABDOMEN:abdomen soft, non-tender and normal bowel sounds Musculoskeletal:no cyanosis of digits and no clubbing  PSYCH: alert & oriented x 3 with fluent speech NEURO: no focal motor/sensory deficits  LABORATORY DATA:  I have reviewed the data as listed Lab Results  Component Value Date   WBC 9.7 01/07/2017   HGB 10.7 (L) 01/07/2017   HCT 32.9 (L) 01/07/2017   MCV 81.4 01/07/2017   PLT 217 01/07/2017   Recent Labs    12/25/16 0434 12/27/16 0411 01/02/17 0608 01/06/17 1445 01/07/17 0525  NA 134* 131* 131* 129* 130*  K 4.0 3.6 3.3* 3.6 3.3*  CL 98* 96* 95* 96* 95*  CO2 28 26 27 24 25  GLUCOSE 118* 123* 105* 207* 93  BUN 29* 26* 24* 27* 25*  CREATININE 0.92 1.01* 0.83 0.97 0.83  CALCIUM 8.5* 8.5* 8.9 8.9 8.7*  GFRNONAA >60 58* >60 >60 >60  GFRAA >60 >60 >60 >60 >60  PROT 6.0* 6.1*  --   --  5.8*  ALBUMIN 3.0* 3.2*  --   --  3.3*  AST 22 22  --   --  20  ALT 29 26  --   --  19  ALKPHOS 45 51  --   --  61  BILITOT 0.4 0.4  --   --  0.6    RADIOGRAPHIC STUDIES: I have personally reviewed the radiological images as listed and agreed with the findings in the report. Dg Chest 2 View  Result Date: 12/18/2016 CLINICAL DATA:  Weakness. EXAM: CHEST  2 VIEW COMPARISON:  Chest x-ray dated December 12, 2016. FINDINGS: The cardiomediastinal silhouette is normal in size. Normal pulmonary vascularity. No consolidation, pleural effusion, or pneumothorax. 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 Unchanged fracture of the left lateral fourth rib. IMPRESSION: Unchanged left extrapleural soft tissue mass with destruction of the left fifth rib and nondisplaced fracture  of the left lateral fourth rib. Electronically Signed   By: William T Derry M.D.   On: 12/18/2016 17:54   Dg Thoracic Spine 2 View  Result Date: 12/19/2016 CLINICAL DATA:  Decompression T5 through T8 thoracic. EXAM: THORACIC SPINE 2 VIEWS COMPARISON:  Limited thoracic spine MRI earlier this day FINDINGS: Four AP views of the chest/thoracic spine obtained during thoracic surgery. Initial image demonstrates surgical instrument localizing to C7. Second image was surgical instrument localizing to T4-T5. Subsequent images with surgical instruments in place. No hardware visualized on provided images. An endotracheal tube is present. IMPRESSION: Intra procedural surgical localization during thoracic spine surgery. Please reference operative report for details. Electronically Signed   By: Melanie  Ehinger M.D.   On: 12/19/2016 00:50   Mr Thoracic Spine Limited Wo Contrast  Result Date: 12/18/2016 CLINICAL DATA:  Bilateral lower extremity weakness. Status post fall yesterday. The patient has a history of carcinoma of unknown primary. EXAM: MRI THORACIC SPINE WITHOUT CONTRAST TECHNIQUE: Multiplanar, multisequence MR imaging of the thoracic spine was performed. No intravenous contrast was administered. COMPARISON:  CT chest 12/12/2016. FINDINGS: The patient could only tolerate scanning for a T2 weighted sagittal sequence and scout imaging. The T7 vertebral body is replaced with tumor. Extensive epidural tumor is seen eccentric to   the right extending cephalad into the T6-7 neural foramen on the right. Tumor also extends posteriorly and compresses the cord. The T7-8 foramen is also filled with tumor. No other evidence of epidural tumor is identified. IMPRESSION: Very limited examination demonstrating compression of the cord from approximately mid T6 to T7-8 by epidural tumor eccentric to the right and surrounding the cord both anteriorly and posteriorly. Tumor fills the right neural foramina at T6-7 and T7-8 and  completely replaces the T7 vertebral body. These results were called by telephone at the time of interpretation on 12/18/2016 at 5:14 pm to Dr. Francine Graven , who verbally acknowledged these results. Electronically Signed   By: Inge Rise M.D.   On: 12/18/2016 17:20   Ir US Guide Vasc Access Right  Result Date: 12/30/2016 CLINICAL DATA:  Diffuse large B-cell lymphoma and need for porta cath to begin chemotherapy. EXAM: IMPLANTED PORT A CATH PLACEMENT WITH ULTRASOUND AND FLUOROSCOPIC GUIDANCE ANESTHESIA/SEDATION: 2.0 mg IV Versed; 100 mcg IV Fentanyl Total Moderate Sedation Time:  33 minutes The patient's level of consciousness and physiologic status were continuously monitored during the procedure by Radiology nursing. Additional Medications: 2 g IV Ancef. FLUOROSCOPY TIME:  24 seconds.  2.0 mGy. PROCEDURE: The procedure, risks, benefits, and alternatives were explained to the patient. Questions regarding the procedure were encouraged and answered. The patient understands and consents to the procedure. A time-out was performed prior to initiating the procedure. Ultrasound was utilized to confirm patency of the right internal jugular vein. The right neck and chest were prepped with chlorhexidine in a sterile fashion, and a sterile drape was applied covering the operative field. Maximum barrier sterile technique with sterile gowns and gloves were used for the procedure. Local anesthesia was provided with 1% lidocaine. After creating a small venotomy incision, a 21 gauge needle was advanced into the right internal jugular vein under direct, real-time ultrasound guidance. Ultrasound image documentation was performed. After securing guidewire access, an 8 Fr dilator was placed. A J-wire was kinked to measure appropriate catheter length. A subcutaneous port pocket was then created along the upper chest wall utilizing sharp and blunt dissection. Portable cautery was utilized. The pocket was irrigated with  sterile saline. A single lumen power injectable port was chosen for placement. The 8 Fr catheter was tunneled from the port pocket site to the venotomy incision. The port was placed in the pocket. External catheter was trimmed to appropriate length based on guidewire measurement. At the venotomy, an 8 Fr peel-away sheath was placed over a guidewire. The catheter was then placed through the sheath and the sheath removed. Final catheter positioning was confirmed and documented with a fluoroscopic spot image. The port was accessed with a needle and aspirated and flushed with heparinized saline. The access needle was removed. The venotomy and port pocket incisions were closed with subcutaneous 3-0 Monocryl and subcuticular 4-0 Vicryl. Dermabond was applied to both incisions. COMPLICATIONS: COMPLICATIONS None FINDINGS: After catheter placement, the tip lies at the cavo-atrial junction. The catheter aspirates normally and is ready for immediate use. IMPRESSION: Placement of single lumen port a cath via right internal jugular vein. The catheter tip lies at the cavo-atrial junction. A power injectable port a cath was placed and is ready for immediate use. Electronically Signed   By: Aletta Edouard M.D.   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 EXAM:  IMPLANTED PORT A CATH PLACEMENT WITH ULTRASOUND AND FLUOROSCOPIC GUIDANCE ANESTHESIA/SEDATION: 2.0 mg IV Versed; 100 mcg IV Fentanyl Total Moderate Sedation Time:  33 minutes The patient's level of consciousness and physiologic status were continuously monitored during the procedure by Radiology nursing. Additional Medications: 2 g IV Ancef. FLUOROSCOPY TIME:  24 seconds.  2.0 mGy. PROCEDURE: The procedure, risks, benefits, and alternatives were explained to the patient. Questions regarding the procedure were encouraged and answered. The  patient understands and consents to the procedure. A time-out was performed prior to initiating the procedure. Ultrasound was utilized to confirm patency of the right internal jugular vein. The right neck and chest were prepped with chlorhexidine in a sterile fashion, and a sterile drape was applied covering the operative field. Maximum barrier sterile technique with sterile gowns and gloves were used for the procedure. Local anesthesia was provided with 1% lidocaine. After creating a small venotomy incision, a 21 gauge needle was advanced into the right internal jugular vein under direct, real-time ultrasound guidance. Ultrasound image documentation was performed. After securing guidewire access, an 8 Fr dilator was placed. A J-wire was kinked to measure appropriate catheter length. A subcutaneous port pocket was then created along the upper chest wall utilizing sharp and blunt dissection. Portable cautery was utilized. The pocket was irrigated with sterile saline. A single lumen power injectable port was chosen for placement. The 8 Fr catheter was tunneled from the port pocket site to the venotomy incision. The port was placed in the pocket. External catheter was trimmed to appropriate length based on guidewire measurement. At the venotomy, an 8 Fr peel-away sheath was placed over a guidewire. The catheter was then placed through the sheath and the sheath removed. Final catheter positioning was confirmed and documented with a fluoroscopic spot image. The port was accessed with a needle and aspirated and flushed with heparinized saline. The access needle was removed. The venotomy and port pocket incisions were closed with subcutaneous 3-0 Monocryl and subcuticular 4-0 Vicryl. Dermabond was applied to both incisions. COMPLICATIONS: COMPLICATIONS None FINDINGS: After catheter placement, the tip lies at the cavo-atrial junction. The catheter aspirates normally and is ready for immediate use. IMPRESSION: Placement of  single lumen port a cath via right internal jugular vein. The catheter tip lies at the cavo-atrial junction. A power injectable port a cath was placed and is ready for immediate use. Electronically Signed   By: Aletta Edouard M.D.   On: 12/30/2016 13:10    ASSESSMENT & PLAN:   Diffuse large B-cell lymphoma She has high risk disease, stage IV We discussed the risks, benefits, side effects of chemotherapy with R-EPOCH along with intrathecal chemotherapy and she agreed to proceed She will get outpatient Neulasta next week Her blood count from last week is satisfactory I will not repeat her blood draw until tomorrow She will start allopurinol for tumor lysis prophylaxis along with IV hydration I will also order acyclovir for antimicrobial prophylaxis She will receive rituximab on Wednesday and possibly intrathecal chemo on Friday  Recent cord compression status post decompression surgery She had mild residual neurological deficit from recent surgery I plan to consult PT for ongoing therapy while she is hospitalized  Anemia chronic illness Will monitor  DVT prophylaxis On Lovenox  Mild hyponatremia and hypokalemia We will monitor closely  Mild back pain Analgesics as needed  CODE STATUS Full code  Discharge planning End of the week  All questions were answered. The patient knows to call the clinic with any problems, questions  or concerns. I spent 55 minutes counseling the patient face to face. The total time spent in the appointment was 80 minutes and more than 50% was on counseling.      , MD 01/13/2017 11:24 AM    

## 2017-01-14 ENCOUNTER — Other Ambulatory Visit: Payer: Self-pay | Admitting: Hematology and Oncology

## 2017-01-14 DIAGNOSIS — E876 Hypokalemia: Secondary | ICD-10-CM

## 2017-01-14 DIAGNOSIS — Z5111 Encounter for antineoplastic chemotherapy: Principal | ICD-10-CM

## 2017-01-14 DIAGNOSIS — E871 Hypo-osmolality and hyponatremia: Secondary | ICD-10-CM

## 2017-01-14 DIAGNOSIS — C8330 Diffuse large B-cell lymphoma, unspecified site: Secondary | ICD-10-CM

## 2017-01-14 DIAGNOSIS — D638 Anemia in other chronic diseases classified elsewhere: Secondary | ICD-10-CM

## 2017-01-14 DIAGNOSIS — M549 Dorsalgia, unspecified: Secondary | ICD-10-CM

## 2017-01-14 LAB — COMPREHENSIVE METABOLIC PANEL
ALBUMIN: 3.3 g/dL — AB (ref 3.5–5.0)
ALK PHOS: 62 U/L (ref 38–126)
ALT: 20 U/L (ref 14–54)
AST: 20 U/L (ref 15–41)
Anion gap: 8 (ref 5–15)
BILIRUBIN TOTAL: 0.6 mg/dL (ref 0.3–1.2)
BUN: 20 mg/dL (ref 6–20)
CALCIUM: 8.6 mg/dL — AB (ref 8.9–10.3)
CO2: 27 mmol/L (ref 22–32)
Chloride: 97 mmol/L — ABNORMAL LOW (ref 101–111)
Creatinine, Ser: 0.73 mg/dL (ref 0.44–1.00)
GFR calc Af Amer: >60 mL/min (ref >60)
GFR calc non Af Amer: >60 mL/min (ref >60)
GLUCOSE: 142 mg/dL — AB (ref 65–99)
Potassium: 3.2 mmol/L — ABNORMAL LOW (ref 3.5–5.1)
Sodium: 132 mmol/L — ABNORMAL LOW (ref 135–145)
TOTAL PROTEIN: 6.1 g/dL — AB (ref 6.5–8.1)

## 2017-01-14 LAB — URIC ACID: Uric Acid, Serum: 5.4 mg/dL (ref 2.3–6.6)

## 2017-01-14 LAB — CBC WITH DIFFERENTIAL/PLATELET
Basophils Absolute: 0 K/uL (ref 0.0–0.1)
Basophils Relative: 0 %
Eosinophils Absolute: 0 K/uL (ref 0.0–0.7)
Eosinophils Relative: 0 %
HCT: 29.7 % — ABNORMAL LOW (ref 36.0–46.0)
Hemoglobin: 10 g/dL — ABNORMAL LOW (ref 12.0–15.0)
Lymphocytes Relative: 13 %
Lymphs Abs: 0.8 K/uL (ref 0.7–4.0)
MCH: 27.5 pg (ref 26.0–34.0)
MCHC: 33.7 g/dL (ref 30.0–36.0)
MCV: 81.6 fL (ref 78.0–100.0)
Monocytes Absolute: 0.4 K/uL (ref 0.1–1.0)
Monocytes Relative: 7 %
Neutro Abs: 4.9 K/uL (ref 1.7–7.7)
Neutrophils Relative %: 80 %
Platelets: 180 K/uL (ref 150–400)
RBC: 3.64 MIL/uL — ABNORMAL LOW (ref 3.87–5.11)
RDW: 15.3 % (ref 11.5–15.5)
WBC: 6.1 K/uL (ref 4.0–10.5)

## 2017-01-14 LAB — LACTATE DEHYDROGENASE: LDH: 134 U/L (ref 98–192)

## 2017-01-14 MED ORDER — VINCRISTINE SULFATE CHEMO INJECTION 1 MG/ML
Freq: Once | INTRAVENOUS | Status: AC
  Administered 2017-01-14: 12:00:00 via INTRAVENOUS
  Filled 2017-01-14: qty 12

## 2017-01-14 MED ORDER — SODIUM CHLORIDE 0.9 % IV SOLN
Freq: Once | INTRAVENOUS | Status: AC
  Administered 2017-01-14: 8 mg via INTRAVENOUS
  Filled 2017-01-14: qty 4

## 2017-01-14 NOTE — Progress Notes (Signed)
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     PT Comments    Patient required +2 assist with ambulation with RW for safety due to ataxia and decreased proprioception. Ambulated 50 feet, followed with recliner, and required a short rest halfway through ambulation. Required VC's for sequencing, to achieve balance before initiation of next step, and to look down at feet when patient noticed an unsteadiness. LOBx3 due to stepping on opposite foot and crossing LE's. Noted decreased foot clearance due to decreased B dorsiflexion, with compensation with B knee extension through the swing phase. Positioned in recliner.    Follow Up Recommendations  Home health PT     Equipment Recommendations  None recommended by PT    Recommendations for Other Services       Precautions / Restrictions Precautions Precautions: Fall Required Braces or Orthoses: Other Brace/Splint Other Brace/Splint: L knee brace to control  knee hyperextension  Restrictions Weight Bearing Restrictions: No    Mobility  Bed Mobility               General bed mobility comments: OOB in recliner  Transfers Overall transfer level: Needs assistance Equipment used: Rolling walker (2 wheeled) Transfers: Sit to/from Stand Sit to Stand: Min assist;+2 physical assistance         General transfer comment: required min assist for to stand from recliner, steady upon standing with RW. Ataxic movements with LE's  Ambulation/Gait Ambulation/Gait assistance: Mod assist;+2 physical assistance Ambulation Distance (Feet): 50 Feet Assistive device: Rolling walker (2 wheeled) Gait  Pattern/deviations: Step-through pattern;Ataxic;Decreased dorsiflexion - right;Decreased dorsiflexion - left;Narrow base of support Gait velocity: decreased Gait velocity interpretation: Below normal speed for age/gender General Gait Details: mod assist +2 for safety with RW. Followed with recliner. Required 25% VC's for sequencing, decrease gait speed, to achieve balance before initiation of next step, and to look down at feet when patient noticed an unsteadiness. LOBx3 due to stepping on opposite foot and crossing LE's. Noted decreased foot clearance due to decreased B dorsiflexion, with compensation with B knee extension through the swing phase and a slight hip hike.   Stairs            Wheelchair Mobility    Modified Rankin (Stroke Patients Only)       Balance                                            Cognition Arousal/Alertness: Awake/alert Behavior During Therapy: WFL for tasks assessed/performed Overall Cognitive Status: Within Functional Limits for tasks assessed                                        Exercises      General Comments        Pertinent Vitals/Pain Pain Assessment: No/denies pain    Home Living                      Prior Function  PT Goals (current goals can now be found in the care plan section)      Frequency    Min 2X/week      PT Plan Current plan remains appropriate    Co-evaluation              AM-PAC PT "6 Clicks" Daily Activity  Outcome Measure  Difficulty turning over in bed (including adjusting bedclothes, sheets and blankets)?: A Little Difficulty moving from lying on back to sitting on the side of the bed? : A Little Difficulty sitting down on and standing up from a chair with arms (e.g., wheelchair, bedside commode, etc,.)?: A Lot Help needed moving to and from a bed to chair (including a wheelchair)?: Total Help needed walking in hospital room?:  Total Help needed climbing 3-5 steps with a railing? : Total 6 Click Score: 11    End of Session Equipment Utilized During Treatment: Gait belt Activity Tolerance: Patient tolerated treatment well Patient left: in chair;with call bell/phone within reach Nurse Communication: Mobility status PT Visit Diagnosis: Other abnormalities of gait and mobility (R26.89);Unsteadiness on feet (R26.81);Muscle weakness (generalized) (M62.81);Ataxic gait (R26.0)     Time: 9604-5409 PT Time Calculation (min) (ACUTE ONLY): 16 min  Charges:  $Gait Training: 8-22 mins                    G Codes:       Almond Lint, SPTA [facility] [place]  PTA [facility]  Acute  Rehab Pager      [phone]

## 2017-01-14 NOTE — Progress Notes (Signed)
Katrina [name]   DOB:Mar 14, 1950   AS#:[id number]    Subjective: She tolerated cycle 1 day 1 well.  No recent nausea, vomiting or side effects from treatment.  She was able to mobilize from bed to the bathroom without issues.  Objective:  Vitals:   01/13/17 2032 01/14/17 0429  BP: (!) 143/82 132/64  Pulse: 84 93  Resp: 18 18  Temp: 98 F (36.7 C) 98.4 F (36.9 C)  SpO2: 98% 100%     Intake/Output Summary (Last 24 hours) at 01/14/2017 0801 Last data filed at 01/14/2017 0455 Gross per 24 hour  Intake 1879.23 ml  Output 1200 ml  Net 679.23 ml    GENERAL:alert, no distress and comfortable SKIN: skin color, texture, turgor are normal, no rashes or significant lesions EYES: normal, Conjunctiva are pink and non-injected, sclera clear OROPHARYNX:no exudate, no erythema and lips, buccal mucosa, and tongue normal  NECK: supple, thyroid normal size, non-tender, without nodularity LYMPH:  no palpable lymphadenopathy in the cervical, axillary or inguinal LUNGS: clear to auscultation and percussion with normal breathing effort HEART: regular rate & rhythm and no murmurs and no lower extremity edema ABDOMEN:abdomen soft, non-tender and normal bowel sounds Musculoskeletal:no cyanosis of digits and no clubbing  NEURO: alert & oriented x 3 with fluent speech, no focal motor/sensory deficits   Labs:  Lab Results  Component Value Date   WBC 6.1 01/14/2017   HGB 10.0 (L) 01/14/2017   HCT 29.7 (L) 01/14/2017   MCV 81.6 01/14/2017   PLT 180 01/14/2017   NEUTROABS 4.9 01/14/2017    Lab Results  Component Value Date   NA 132 (L) 01/14/2017   K 3.2 (L) 01/14/2017   CL 97 (L) 01/14/2017   CO2 27 01/14/2017    Assessment & Plan:   Diffuse large B-cell lymphoma She tolerated cycle 1, day 1 well. No signs of tumor lysis so far She will continue allopurinol and IV hydration for tumor lysis prophylaxis She will continue on acyclovir for antimicrobial prophylaxis She will receive rituximab  on Wednesday and possibly intrathecal chemo on Thursday or Friday  Recent cord compression status post decompression surgery She had mild residual neurological deficit from recent surgery She will continue PT while she is hospitalized  Anemia chronic illness Will monitor  Mild hyponatremia and hypokalemia Asymptomatic.  Continue close monitoring.  No need potassium replacement. I will check magnesium level tomorrow  DVT prophylaxis On Lovenox, will hold after today's dose in anticipation for possible IT chemo on Thursday or Friday  Mild back pain Analgesics as needed  CODE STATUS Full code  Discharge planning End of the week  Heath Lark, MD 01/14/2017  8:01 AM

## 2017-01-15 LAB — BASIC METABOLIC PANEL
ANION GAP: 4 — AB (ref 5–15)
BUN: 18 mg/dL (ref 6–20)
CALCIUM: 8.2 mg/dL — AB (ref 8.9–10.3)
CO2: 27 mmol/L (ref 22–32)
CREATININE: 0.64 mg/dL (ref 0.44–1.00)
Chloride: 105 mmol/L (ref 101–111)
Glucose, Bld: 110 mg/dL — ABNORMAL HIGH (ref 65–99)
Potassium: 3.3 mmol/L — ABNORMAL LOW (ref 3.5–5.1)
Sodium: 136 mmol/L (ref 135–145)

## 2017-01-15 LAB — URIC ACID: URIC ACID, SERUM: 4.2 mg/dL (ref 2.3–6.6)

## 2017-01-15 LAB — LACTATE DEHYDROGENASE: LDH: 123 U/L (ref 98–192)

## 2017-01-15 MED ORDER — EPINEPHRINE PF 1 MG/ML IJ SOLN
0.5000 mg | Freq: Once | INTRAMUSCULAR | Status: DC | PRN
  Filled 2017-01-15: qty 1

## 2017-01-15 MED ORDER — DIPHENHYDRAMINE HCL 50 MG/ML IJ SOLN: 50.0000 mg | Freq: Once | INTRAMUSCULAR | Status: DC | PRN

## 2017-01-15 MED ORDER — ALBUTEROL SULFATE (2.5 MG/3ML) 0.083% IN NEBU: 2.5000 mg | INHALATION_SOLUTION | Freq: Once | RESPIRATORY_TRACT | Status: DC | PRN

## 2017-01-15 MED ORDER — SODIUM CHLORIDE 0.9 % IV SOLN
Freq: Once | INTRAVENOUS | Status: AC
  Administered 2017-01-15: 11:00:00 via INTRAVENOUS

## 2017-01-15 MED ORDER — SODIUM CHLORIDE 0.9 % IV SOLN
Freq: Once | INTRAVENOUS | Status: AC
  Administered 2017-01-15: 8 mg via INTRAVENOUS
  Filled 2017-01-15: qty 4

## 2017-01-15 MED ORDER — SODIUM CHLORIDE 0.9% FLUSH: 10.0000 mL | INTRAVENOUS | Status: DC | PRN

## 2017-01-15 MED ORDER — DIPHENHYDRAMINE HCL 50 MG PO CAPS
50.0000 mg | ORAL_CAPSULE | Freq: Once | ORAL | Status: AC
  Administered 2017-01-15: 50 mg via ORAL
  Filled 2017-01-15: qty 1

## 2017-01-15 MED ORDER — VINCRISTINE SULFATE CHEMO INJECTION 1 MG/ML
Freq: Once | INTRAVENOUS | Status: AC
  Administered 2017-01-15: 18:00:00 via INTRAVENOUS
  Filled 2017-01-15: qty 12

## 2017-01-15 MED ORDER — SODIUM CHLORIDE 0.9 % IV SOLN
375.0000 mg/m2 | Freq: Once | INTRAVENOUS | Status: AC
  Administered 2017-01-15: 800 mg via INTRAVENOUS
  Filled 2017-01-15: qty 50

## 2017-01-15 MED ORDER — METHYLPREDNISOLONE SODIUM SUCC 125 MG IJ SOLR: 125.0000 mg | Freq: Once | INTRAMUSCULAR | Status: DC | PRN

## 2017-01-15 MED ORDER — ACETAMINOPHEN 325 MG PO TABS
650.0000 mg | ORAL_TABLET | Freq: Once | ORAL | Status: AC
  Administered 2017-01-15: 650 mg via ORAL
  Filled 2017-01-15: qty 2

## 2017-01-15 MED ORDER — DIPHENHYDRAMINE HCL 50 MG/ML IJ SOLN
25.0000 mg | Freq: Once | INTRAMUSCULAR | Status: DC | PRN
  Filled 2017-01-15: qty 1

## 2017-01-15 MED ORDER — SODIUM CHLORIDE 0.9 % IV SOLN: Freq: Once | INTRAVENOUS | Status: DC | PRN

## 2017-01-15 MED ORDER — SODIUM CHLORIDE 0.9% FLUSH: 3.0000 mL | INTRAVENOUS | Status: DC | PRN

## 2017-01-15 MED ORDER — EPINEPHRINE PF 1 MG/10ML IJ SOSY: 0.2500 mg | PREFILLED_SYRINGE | Freq: Once | INTRAMUSCULAR | Status: DC | PRN

## 2017-01-15 MED ORDER — HEPARIN SOD (PORK) LOCK FLUSH 100 UNIT/ML IV SOLN
500.0000 [IU] | Freq: Once | INTRAVENOUS | Status: DC | PRN
  Filled 2017-01-15 (×2): qty 5

## 2017-01-15 MED ORDER — FAMOTIDINE IN NACL 20-0.9 MG/50ML-% IV SOLN
20.0000 mg | Freq: Once | INTRAVENOUS | Status: DC | PRN
  Filled 2017-01-15: qty 50

## 2017-01-15 MED ORDER — ALTEPLASE 2 MG IJ SOLR
2.0000 mg | Freq: Once | INTRAMUSCULAR | Status: DC | PRN
  Filled 2017-01-15: qty 2

## 2017-01-15 MED ORDER — HEPARIN SOD (PORK) LOCK FLUSH 100 UNIT/ML IV SOLN: 250.0000 [IU] | Freq: Once | INTRAVENOUS | Status: DC | PRN

## 2017-01-15 NOTE — Progress Notes (Signed)
rituxan dosage and calculations checked with Vincenza Hews

## 2017-01-15 NOTE — Progress Notes (Signed)
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     PT Comments    Patient required +2 assist for safety, followed with recliner, due to ataxic gait and decreased proprioception. Ambulated 80 feet in hallway, requiring 2 short rest breaks. Patient displayed no LOB and did not cross legs or step on the opposite foot as with previous session. Will follow up with patient tomorrow.    Follow Up Recommendations  Home health PT     Equipment Recommendations  None recommended by PT    Recommendations for Other Services       Precautions / Restrictions Precautions Precautions: Fall Required Braces or Orthoses: Other Brace/Splint Other Brace/Splint: L knee brace to control  knee hyperextension  Restrictions Weight Bearing Restrictions: No    Mobility  Bed Mobility               General bed mobility comments: OOB in recliner  Transfers Overall transfer level: Needs assistance Equipment used: Rolling walker (2 wheeled) Transfers: Sit to/from Stand Sit to Stand: Min assist;+2 physical assistance         General transfer comment: min assist to stand from recliner, steady upon standing with RW. Ataxic movements with LE's  Ambulation/Gait Ambulation/Gait assistance: Mod assist;+2 physical assistance Ambulation Distance (Feet): 80 Feet Assistive device: Rolling walker (2 wheeled) Gait Pattern/deviations: Step-through pattern;Ataxic;Decreased dorsiflexion - right;Decreased dorsiflexion - left;Narrow base of support Gait velocity: decreased Gait velocity interpretation: Below normal speed for age/gender General Gait Details: mod assist +2 for safety  with RW, followed with recliner. Required 25% VC's for sequencing, to decrease gait speed, and to achieve balance before intiation of next step. Patient displayed no LOB today and had no occurance of stepping on opposite foot. Patient looks down at feet while walking. Required rest breaks x2. Patient showed improvement in foot clearance from yesterday, and feet didn't drag. Patient still displayed B knee extension throughout the whole swing phase.    Stairs            Wheelchair Mobility    Modified Rankin (Stroke Patients Only)       Balance                                            Cognition Arousal/Alertness: Awake/alert Behavior During Therapy: WFL for tasks assessed/performed Overall Cognitive Status: Within Functional Limits for tasks assessed                                        Exercises      General Comments        Pertinent Vitals/Pain Pain Assessment: No/denies pain    Home Living                      Prior Function            PT Goals (current goals can now be found in the care plan section)      Frequency    Min 2X/week  PT Plan Current plan remains appropriate    Co-evaluation              AM-PAC PT "6 Clicks" Daily Activity  Outcome Measure  Difficulty turning over in bed (including adjusting bedclothes, sheets and blankets)?: A Little Difficulty moving from lying on back to sitting on the side of the bed? : A Little Difficulty sitting down on and standing up from a chair with arms (e.g., wheelchair, bedside commode, etc,.)?: A Lot Help needed moving to and from a bed to chair (including a wheelchair)?: Total Help needed walking in hospital room?: Total Help needed climbing 3-5 steps with a railing? : Total 6 Click Score: 11    End of Session Equipment Utilized During Treatment: Gait belt Activity Tolerance: Patient tolerated treatment well Patient left: in chair;with call  bell/phone within reach Nurse Communication: Mobility status PT Visit Diagnosis: Other abnormalities of gait and mobility (R26.89);Unsteadiness on feet (R26.81);Muscle weakness (generalized) (M62.81);Ataxic gait (R26.0)     Time:  - 13:44 - 14:00    Charges:                       G Codes:       Almond Lint, SPTA [facility] [phone]  Rica Koyanagi  PTA [facility]  Acute  Rehab Pager      [phone]

## 2017-01-15 NOTE — Progress Notes (Signed)
1245 patient into her 200mg s/hr dose, started to complain of itching in her throat and ears, benadryl given with afforded relief. No othe complaints presented.

## 2017-01-15 NOTE — Progress Notes (Signed)
Katrina [name]   DOB:08-26-1950   GU#:[id number]    Subjective: She feels well.  She denies nausea, vomiting or pain. She had mild constipation, resolved with laxatives. She was able to mobilize short distances with physical therapy  Objective:  Vitals:   01/14/17 2125 01/15/17 0505  BP: 138/79 136/61  Pulse: 98 70  Resp: 18 18  Temp: 98 F (36.7 C) 98 F (36.7 C)  SpO2: 98% 98%     Intake/Output Summary (Last 24 hours) at 01/15/2017 0810 Last data filed at 01/15/2017 0500 Gross per 24 hour  Intake 480 ml  Output 1200 ml  Net -720 ml    GENERAL:alert, no distress and comfortable SKIN: skin color, texture, turgor are normal, no rashes or significant lesions EYES: normal, Conjunctiva are pink and non-injected, sclera clear OROPHARYNX:no exudate, no erythema and lips, buccal mucosa, and tongue normal  NECK: supple, thyroid normal size, non-tender, without nodularity LYMPH:  no palpable lymphadenopathy in the cervical, axillary or inguinal LUNGS: clear to auscultation and percussion with normal breathing effort HEART: regular rate & rhythm and no murmurs and no lower extremity edema ABDOMEN:abdomen soft, non-tender and normal bowel sounds Musculoskeletal:no cyanosis of digits and no clubbing  NEURO: alert & oriented x 3 with fluent speech, no focal motor/sensory deficits   Labs:  Lab Results  Component Value Date   WBC 6.1 01/14/2017   HGB 10.0 (L) 01/14/2017   HCT 29.7 (L) 01/14/2017   MCV 81.6 01/14/2017   PLT 180 01/14/2017   NEUTROABS 4.9 01/14/2017    Lab Results  Component Value Date   NA 136 01/15/2017   K 3.3 (L) 01/15/2017   CL 105 01/15/2017   CO2 27 01/15/2017    Assessment & Plan:   Diffuse large B-cell lymphoma She tolerated chemo well so far No signs of tumor lysis so far She will continue allopurinol and IV hydration for tumor lysis prophylaxis She will continue on acyclovir for antimicrobial prophylaxis She will receive rituximab on Wednesday  and possibly intrathecal chemo on Thursday or Friday  Recent cord compression status post decompression surgery She had mild residual neurological deficit from recent surgery She will continue PT while she is hospitalized  Anemia chronic illness Will monitor  Mild hyponatremia and hypokalemia Asymptomatic.  Continue close monitoring.  No need potassium replacement. I will check magnesium level tomorrow  DVT prophylaxis On Lovenox, will hold for possible IT chemo on Thursday or Friday  Mild back pain Analgesics as needed  CODE STATUS Full code  Discharge planning End of the week   Heath Lark, MD 01/15/2017  8:10 AM

## 2017-01-15 NOTE — Progress Notes (Signed)
Dosages and calculations for doxorubicin, etoposide and oncovin verified with Laural Benes RN,

## 2017-01-15 NOTE — Progress Notes (Signed)
Chemo dosages and calculations verified with Aldean Baker RN

## 2017-01-16 ENCOUNTER — Inpatient Hospital Stay (HOSPITAL_COMMUNITY): Payer: Medicare Other

## 2017-01-16 LAB — CBC WITH DIFFERENTIAL/PLATELET
Basophils Absolute: 0 10*3/uL (ref 0.0–0.1)
Basophils Relative: 0 %
EOS ABS: 0 10*3/uL (ref 0.0–0.7)
Eosinophils Relative: 0 %
HCT: 26.5 % — ABNORMAL LOW (ref 36.0–46.0)
HEMOGLOBIN: 9 g/dL — AB (ref 12.0–15.0)
LYMPHS ABS: 0.4 10*3/uL — AB (ref 0.7–4.0)
Lymphocytes Relative: 10 %
MCH: 27.9 pg (ref 26.0–34.0)
MCHC: 34 g/dL (ref 30.0–36.0)
MCV: 82 fL (ref 78.0–100.0)
Monocytes Absolute: 0 10*3/uL — ABNORMAL LOW (ref 0.1–1.0)
Monocytes Relative: 1 %
NEUTROS ABS: 3.4 10*3/uL (ref 1.7–7.7)
NEUTROS PCT: 89 %
Platelets: 171 10*3/uL (ref 150–400)
RBC: 3.23 MIL/uL — AB (ref 3.87–5.11)
RDW: 15.2 % (ref 11.5–15.5)
WBC: 3.8 10*3/uL — AB (ref 4.0–10.5)

## 2017-01-16 LAB — BASIC METABOLIC PANEL
Anion gap: 5 (ref 5–15)
BUN: 15 mg/dL (ref 6–20)
CHLORIDE: 105 mmol/L (ref 101–111)
CO2: 26 mmol/L (ref 22–32)
Calcium: 8.2 mg/dL — ABNORMAL LOW (ref 8.9–10.3)
Creatinine, Ser: 0.56 mg/dL (ref 0.44–1.00)
GFR calc non Af Amer: >60 mL/min (ref >60)
Glucose, Bld: 123 mg/dL — ABNORMAL HIGH (ref 65–99)
POTASSIUM: 3.5 mmol/L (ref 3.5–5.1)
SODIUM: 136 mmol/L (ref 135–145)

## 2017-01-16 LAB — URIC ACID: URIC ACID, SERUM: 3.6 mg/dL (ref 2.3–6.6)

## 2017-01-16 LAB — CSF CELL COUNT WITH DIFFERENTIAL
RBC Count, CSF: 0 /mm3
TUBE #: 4
WBC CSF: 1 /mm3 (ref 0–5)

## 2017-01-16 LAB — MAGNESIUM: MAGNESIUM: 1.9 mg/dL (ref 1.7–2.4)

## 2017-01-16 MED ORDER — SODIUM CHLORIDE 0.9 % IJ SOLN
Freq: Once | INTRAMUSCULAR | Status: DC
  Filled 2017-01-16: qty 0.48

## 2017-01-16 MED ORDER — SODIUM CHLORIDE 0.9 % IV SOLN
Freq: Once | INTRAVENOUS | Status: AC
  Administered 2017-01-16: 8 mg via INTRAVENOUS
  Filled 2017-01-16: qty 4

## 2017-01-16 MED ORDER — ETOPOSIDE CHEMO INJECTION 500 MG/25ML
Freq: Once | INTRAVENOUS | Status: AC
  Administered 2017-01-16: 20:00:00 via INTRAVENOUS
  Filled 2017-01-16: qty 12

## 2017-01-16 MED ORDER — FLEET ENEMA 7-19 GM/118ML RE ENEM
1.0000 | ENEMA | Freq: Once | RECTAL | Status: AC
  Administered 2017-01-16: 1 via RECTAL

## 2017-01-16 MED ORDER — LIDOCAINE HCL 1 % IJ SOLN
INTRAMUSCULAR | Status: AC
  Filled 2017-01-16: qty 20

## 2017-01-16 MED ORDER — BISACODYL 10 MG RE SUPP
10.0000 mg | Freq: Once | RECTAL | Status: AC
  Administered 2017-01-16: 10 mg via RECTAL
  Filled 2017-01-16: qty 1

## 2017-01-16 MED ORDER — SENNA 8.6 MG PO TABS
1.0000 | ORAL_TABLET | Freq: Two times a day (BID) | ORAL | Status: DC
  Administered 2017-01-16 – 2017-01-17 (×2): 8.6 mg via ORAL
  Filled 2017-01-16 (×2): qty 1

## 2017-01-16 NOTE — Progress Notes (Signed)
PT Cancellation Note  Patient Details Name: Katrina [name] MRN: [id number] DOB: Dec 15, 1950   Cancelled Treatment:     lumar puncture in am then enema in pm.  Will attempt to see tomorrow.   Rica Koyanagi  PTA [facility]  Acute  Rehab Pager      [phone]

## 2017-01-16 NOTE — Progress Notes (Signed)
Katrina [name]   DOB:Jun 20, 1950   IR#:[id number]    Subjective: The patient tolerated chemotherapy well so far.  She denies mucositis.  She had minimum nausea.  No constipation.  She is able to mobilize with assistance from physical therapist  Objective:  Vitals:   01/15/17 1958 01/16/17 0533  BP: 138/69 135/83  Pulse: 76 99  Resp: 18 18  Temp: 98.1 F (36.7 C) 98.4 F (36.9 C)  SpO2: 99% 100%     Intake/Output Summary (Last 24 hours) at 01/16/2017 0949 Last data filed at 01/16/2017 0845 Gross per 24 hour  Intake -  Output 303 ml  Net -303 ml    GENERAL:alert, no distress and comfortable SKIN: skin color, texture, turgor are normal, no rashes or significant lesions EYES: normal, Conjunctiva are pink and non-injected, sclera clear OROPHARYNX:no exudate, no erythema and lips, buccal mucosa, and tongue normal  NECK: supple, thyroid normal size, non-tender, without nodularity LYMPH:  no palpable lymphadenopathy in the cervical, axillary or inguinal LUNGS: clear to auscultation and percussion with normal breathing effort HEART: regular rate & rhythm and no murmurs and no lower extremity edema ABDOMEN:abdomen soft, non-tender and normal bowel sounds Musculoskeletal:no cyanosis of digits and no clubbing  NEURO: alert & oriented x 3 with fluent speech, no focal motor/sensory deficits   Labs:  Lab Results  Component Value Date   WBC 3.8 (L) 01/16/2017   HGB 9.0 (L) 01/16/2017   HCT 26.5 (L) 01/16/2017   MCV 82.0 01/16/2017   PLT 171 01/16/2017   NEUTROABS 3.4 01/16/2017    Lab Results  Component Value Date   NA 136 01/16/2017   K 3.5 01/16/2017   CL 105 01/16/2017   CO2 26 01/16/2017    Assessment & Plan:   Diffuse large B-cell lymphoma She tolerated chemo well so far No signs of tumor lysis so far She will continue allopurinol and IV hydration for tumor lysis prophylaxis She willcontinue onacyclovir for antimicrobial prophylaxis She will receive intrathecal  chemo today  Recent cord compression status post decompression surgery She had mild residual neurological deficit from recent surgery She will continuePT while she is hospitalized  Mild pancytopenia Will monitor G-CSF next week  Mild hyponatremia and hypokalemia, resolved Asymptomatic. Continue close monitoring.   DVT prophylaxis On Lovenox,will resume tomorrow  Mild back pain Analgesics as needed  CODE STATUS Full code  Discharge planning End of the week    Heath Lark, MD 01/16/2017  9:49 AM

## 2017-01-16 NOTE — Progress Notes (Signed)
Chemotherapy dosage and calculations checked and reviewed with Lottie Dawson, RN.

## 2017-01-17 ENCOUNTER — Telehealth: Payer: Self-pay | Admitting: Hematology and Oncology

## 2017-01-17 MED ORDER — PROCHLORPERAZINE MALEATE 10 MG PO TABS: 10.0000 mg | ORAL_TABLET | Freq: Four times a day (QID) | ORAL | 1 refills | Status: DC | PRN

## 2017-01-17 MED ORDER — ALLOPURINOL 300 MG PO TABS: 300.0000 mg | ORAL_TABLET | Freq: Every day | ORAL | 0 refills | Status: DC

## 2017-01-17 MED ORDER — ACYCLOVIR 400 MG PO TABS: 400.0000 mg | ORAL_TABLET | Freq: Two times a day (BID) | ORAL | 1 refills | Status: DC

## 2017-01-17 MED ORDER — SODIUM CHLORIDE 0.9 % IV SOLN
750.0000 mg/m2 | Freq: Once | INTRAVENOUS | Status: AC
  Administered 2017-01-17: 1760 mg via INTRAVENOUS
  Filled 2017-01-17: qty 88

## 2017-01-17 MED ORDER — LIDOCAINE-PRILOCAINE 2.5-2.5 % EX CREA
1.0000 | TOPICAL_CREAM | CUTANEOUS | 6 refills | Status: DC | PRN
Start: 2017-01-17 — End: 2017-01-31

## 2017-01-17 MED ORDER — SODIUM CHLORIDE 0.9 % IV SOLN
Freq: Once | INTRAVENOUS | Status: AC
  Administered 2017-01-17: 36 mg via INTRAVENOUS
  Filled 2017-01-17 (×2): qty 8

## 2017-01-17 MED ORDER — ONDANSETRON HCL 8 MG PO TABS: 8.0000 mg | ORAL_TABLET | Freq: Three times a day (TID) | ORAL | 1 refills | Status: DC | PRN

## 2017-01-17 NOTE — Progress Notes (Signed)
She will resume home PT/OT and home health after discharge

## 2017-01-17 NOTE — Telephone Encounter (Signed)
Spoke to patient regarding upcoming December appointments per 11/27 sch message

## 2017-01-17 NOTE — Discharge Summary (Signed)
Physician Discharge Summary  Patient ID: Katrina [name] MRN: [id number] DOB/AGE: Oct 19, 1950 66 y.o.  Admit date: 01/13/2017 Discharge date: 01/17/2017  Primary Care Physician:  Raiford Simmonds., PA-C   Discharge Diagnoses:    Present on Admission: . Diffuse large B cell lymphoma ([place]) . Paraparesis [facility])   Discharge Medications:  Allergies as of 01/17/2017      Reactions   Contrast Media [iodinated Diagnostic Agents] Itching      Medication List    STOP taking these medications   dexamethasone 6 MG tablet Commonly known as:  DECADRON   iron polysaccharides 150 MG capsule Commonly known as:  NIFEREX   potassium chloride SA 20 MEQ tablet Commonly known as:  K-DUR,KLOR-CON     TAKE these medications   acyclovir 400 MG tablet Commonly known as:  ZOVIRAX Take 1 tablet (400 mg total) by mouth 2 (two) times daily.   allopurinol 300 MG tablet Commonly known as:  ZYLOPRIM Take 1 tablet (300 mg total) by mouth daily. Start taking on:  01/18/2017   amLODipine 10 MG tablet Commonly known as:  NORVASC Take 1 tablet (10 mg total) by mouth daily.   cyclobenzaprine 10 MG tablet Commonly known as:  FLEXERIL Take 1 tablet (10 mg total) by mouth 3 (three) times daily as needed for muscle spasms.   docusate sodium 100 MG capsule Commonly known as:  COLACE Take 1 capsule (100 mg total) by mouth 2 (two) times daily.   famotidine 20 MG tablet Commonly known as:  PEPCID Take 1 tablet (20 mg total) by mouth daily.   lidocaine-prilocaine cream Commonly known as:  EMLA Apply 1 application topically as needed.   ondansetron 8 MG tablet Commonly known as:  ZOFRAN Take 1 tablet (8 mg total) by mouth every 8 (eight) hours as needed for nausea (not responsive to prochlorperazine (COMPAZINE)).   prochlorperazine 10 MG tablet Commonly known as:  COMPAZINE Take 1 tablet (10 mg total) by mouth every 6 (six) hours as needed for nausea or vomiting.   traMADol 50 MG  tablet Commonly known as:  ULTRAM Take 1 tablet (50 mg total) by mouth every 6 (six) hours as needed for severe pain.       Disposition and Follow-up:   Significant Diagnostic Studies:  Dg Chest 2 View  Result Date: 12/18/2016 CLINICAL DATA:  Weakness. EXAM: CHEST  2 VIEW COMPARISON:  Chest x-ray dated December 12, 2016. FINDINGS: The cardiomediastinal silhouette is normal in size. Normal pulmonary vascularity. No consolidation, pleural effusion, or pneumothorax. 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 Unchanged fracture of the left lateral fourth rib. IMPRESSION: Unchanged left extrapleural soft tissue mass with destruction of the left fifth rib and nondisplaced fracture of the left lateral fourth rib. Electronically Signed   By: Titus Dubin M.D.   On: 12/18/2016 17:54   Dg Thoracic Spine 2 View  Result Date: 12/19/2016 CLINICAL DATA:  Decompression T5 through T8 thoracic. EXAM: THORACIC SPINE 2 VIEWS COMPARISON:  Limited thoracic spine MRI earlier this day FINDINGS: Four AP views of the chest/thoracic spine obtained during thoracic surgery. Initial image demonstrates surgical instrument localizing to C7. Second image was surgical instrument localizing to T4-T5. Subsequent images with surgical instruments in place. No hardware visualized on provided images. An endotracheal tube is present. IMPRESSION: Intra procedural surgical localization during thoracic spine surgery. Please reference operative report for details. Electronically Signed   By: Jeb Levering M.D.   On: 12/19/2016 00:50  Mr Thoracic Spine Limited Wo Contrast  Result Date: 12/18/2016 CLINICAL DATA:  Bilateral lower extremity weakness. Status post fall yesterday. The patient has a history of carcinoma of unknown primary. EXAM: MRI THORACIC SPINE WITHOUT CONTRAST TECHNIQUE: Multiplanar, multisequence MR imaging of the thoracic spine was performed. No intravenous contrast was  administered. COMPARISON:  CT chest 12/12/2016. FINDINGS: The patient could only tolerate scanning for a T2 weighted sagittal sequence and scout imaging. The T7 vertebral body is replaced with tumor. Extensive epidural tumor is seen eccentric to the right extending cephalad into the T6-7 neural foramen on the right. Tumor also extends posteriorly and compresses the cord. The T7-8 foramen is also filled with tumor. No other evidence of epidural tumor is identified. IMPRESSION: Very limited examination demonstrating compression of the cord from approximately mid T6 to T7-8 by epidural tumor eccentric to the right and surrounding the cord both anteriorly and posteriorly. Tumor fills the right neural foramina at T6-7 and T7-8 and completely replaces the T7 vertebral body. These results were called by telephone at the time of interpretation on 12/18/2016 at 5:14 pm to Dr. Francine Graven , who verbally acknowledged these results. Electronically Signed   By: Inge Rise M.D.   On: 12/18/2016 17:20   Ir US Guide Vasc Access Right  Result Date: 12/30/2016 CLINICAL DATA:  Diffuse large B-cell lymphoma and need for porta cath to begin chemotherapy. EXAM: IMPLANTED PORT A CATH PLACEMENT WITH ULTRASOUND AND FLUOROSCOPIC GUIDANCE ANESTHESIA/SEDATION: 2.0 mg IV Versed; 100 mcg IV Fentanyl Total Moderate Sedation Time:  33 minutes The patient's level of consciousness and physiologic status were continuously monitored during the procedure by Radiology nursing. Additional Medications: 2 g IV Ancef. FLUOROSCOPY TIME:  24 seconds.  2.0 mGy. PROCEDURE: The procedure, risks, benefits, and alternatives were explained to the patient. Questions regarding the procedure were encouraged and answered. The patient understands and consents to the procedure. A time-out was performed prior to initiating the procedure. Ultrasound was utilized to confirm patency of the right internal jugular vein. The right neck and chest were prepped  with chlorhexidine in a sterile fashion, and a sterile drape was applied covering the operative field. Maximum barrier sterile technique with sterile gowns and gloves were used for the procedure. Local anesthesia was provided with 1% lidocaine. After creating a small venotomy incision, a 21 gauge needle was advanced into the right internal jugular vein under direct, real-time ultrasound guidance. Ultrasound image documentation was performed. After securing guidewire access, an 8 Fr dilator was placed. A J-wire was kinked to measure appropriate catheter length. A subcutaneous port pocket was then created along the upper chest wall utilizing sharp and blunt dissection. Portable cautery was utilized. The pocket was irrigated with sterile saline. A single lumen power injectable port was chosen for placement. The 8 Fr catheter was tunneled from the port pocket site to the venotomy incision. The port was placed in the pocket. External catheter was trimmed to appropriate length based on guidewire measurement. At the venotomy, an 8 Fr peel-away sheath was placed over a guidewire. The catheter was then placed through the sheath and the sheath removed. Final catheter positioning was confirmed and documented with a fluoroscopic spot image. The port was accessed with a needle and aspirated and flushed with heparinized saline. The access needle was removed. The venotomy and port pocket incisions were closed with subcutaneous 3-0 Monocryl and subcuticular 4-0 Vicryl. Dermabond was applied to both incisions. COMPLICATIONS: COMPLICATIONS None FINDINGS: After catheter placement, the tip lies at the  cavo-atrial junction. The catheter aspirates normally and is ready for immediate use. IMPRESSION: Placement of single lumen port a cath via right internal jugular vein. The catheter tip lies at the cavo-atrial junction. A power injectable port a cath was placed and is ready for immediate use. Electronically Signed   By: Aletta Edouard  M.D.   On: 12/30/2016 13:10   Dg Fluoro Guided Needle Plc Aspiration/injection Loc  Result Date: 01/16/2017 CLINICAL DATA:  B-cell lymphoma EXAM: FLUOROSCOPICALLY GUIDED LUMBAR PUNCTURE FOR INTRATHECAL CHEMOTHERAPY TECHNIQUE: Informed consent was obtained from the patient prior to the procedure, including potential complications of headache, allergy, and pain. A 'time out' was performed. With the patient prone, the lower back was prepped with Betadine. 1% Lidocaine was used for local anesthesia. Lumbar puncture was performed at the L3-4 using a 20 gauge needle with return of clear CSF. 8 cc clear CSF S collected for testing. 12 mg Of methotrexate was injected into the subarachnoid space. The patient tolerated the procedure well without apparent complication. FLUOROSCOPY TIME:  13 seconds IMPRESSION: Intrathecal injection of chemotherapy without complication Electronically Signed   By: Rolm Baptise M.D.   On: 01/16/2017 10:11   Ir Fluoro Guide Port Insertion Right  Result Date: 12/30/2016 CLINICAL DATA:  Diffuse large B-cell lymphoma and need for porta cath to begin chemotherapy. EXAM: IMPLANTED PORT A CATH PLACEMENT WITH ULTRASOUND AND FLUOROSCOPIC GUIDANCE ANESTHESIA/SEDATION: 2.0 mg IV Versed; 100 mcg IV Fentanyl Total Moderate Sedation Time:  33 minutes The patient's level of consciousness and physiologic status were continuously monitored during the procedure by Radiology nursing. Additional Medications: 2 g IV Ancef. FLUOROSCOPY TIME:  24 seconds.  2.0 mGy. PROCEDURE: The procedure, risks, benefits, and alternatives were explained to the patient. Questions regarding the procedure were encouraged and answered. The patient understands and consents to the procedure. A time-out was performed prior to initiating the procedure. Ultrasound was utilized to confirm patency of the right internal jugular vein. The right neck and chest were prepped with chlorhexidine in a sterile fashion, and a sterile drape was  applied covering the operative field. Maximum barrier sterile technique with sterile gowns and gloves were used for the procedure. Local anesthesia was provided with 1% lidocaine. After creating a small venotomy incision, a 21 gauge needle was advanced into the right internal jugular vein under direct, real-time ultrasound guidance. Ultrasound image documentation was performed. After securing guidewire access, an 8 Fr dilator was placed. A J-wire was kinked to measure appropriate catheter length. A subcutaneous port pocket was then created along the upper chest wall utilizing sharp and blunt dissection. Portable cautery was utilized. The pocket was irrigated with sterile saline. A single lumen power injectable port was chosen for placement. The 8 Fr catheter was tunneled from the port pocket site to the venotomy incision. The port was placed in the pocket. External catheter was trimmed to appropriate length based on guidewire measurement. At the venotomy, an 8 Fr peel-away sheath was placed over a guidewire. The catheter was then placed through the sheath and the sheath removed. Final catheter positioning was confirmed and documented with a fluoroscopic spot image. The port was accessed with a needle and aspirated and flushed with heparinized saline. The access needle was removed. The venotomy and port pocket incisions were closed with subcutaneous 3-0 Monocryl and subcuticular 4-0 Vicryl. Dermabond was applied to both incisions. COMPLICATIONS: COMPLICATIONS None FINDINGS: After catheter placement, the tip lies at the cavo-atrial junction. The catheter aspirates normally and is ready for immediate use.  IMPRESSION: Placement of single lumen port a cath via right internal jugular vein. The catheter tip lies at the cavo-atrial junction. A power injectable port a cath was placed and is ready for immediate use. Electronically Signed   By: Aletta Edouard M.D.   On: 12/30/2016 13:10    Discharge Laboratory Values: Lab  Results  Component Value Date   WBC 3.8 (L) 01/16/2017   HGB 9.0 (L) 01/16/2017   HCT 26.5 (L) 01/16/2017   MCV 82.0 01/16/2017   PLT 171 01/16/2017   Lab Results  Component Value Date   NA 136 01/16/2017   K 3.5 01/16/2017   CL 105 01/16/2017   CO2 26 01/16/2017    Brief H and P: For complete details please refer to admission H and P, but in brief, she was admitted to the hospital from January 13, 2017 to January 17, 2017 for cycle 1 of chemotherapy due to stage IV diffuse large B-cell lymphoma. Overall, she tolerated chemotherapy very well.  She has minimum complication except for mild pancytopenia and constipation.  Physical Exam at Discharge: BP (!) 149/81 (BP Location: Right Arm)   Pulse 78   Temp 97.9 F (36.6 C) (Oral)   Resp 16   Ht 5\' 8"  (1.727 m)   Wt 227 lb 12.8 oz (103.3 kg)   SpO2 100%   BMI 34.64 kg/m  GENERAL:alert, no distress and comfortable SKIN: skin color, texture, turgor are normal, no rashes or significant lesions EYES: normal, Conjunctiva are pink and non-injected, sclera clear OROPHARYNX:no exudate, no erythema and lips, buccal mucosa, and tongue normal  NECK: supple, thyroid normal size, non-tender, without nodularity LYMPH:  no palpable lymphadenopathy in the cervical, axillary or inguinal LUNGS: clear to auscultation and percussion with normal breathing effort HEART: regular rate & rhythm and no murmurs and no lower extremity edema ABDOMEN:abdomen soft, non-tender and normal bowel sounds Musculoskeletal:no cyanosis of digits and no clubbing  NEURO: alert & oriented x 3 with fluent speech, no focal motor/sensory deficits  Hospital Course:  Active Problems:   Diffuse large B cell lymphoma ([facility])   S/P lumbar laminectomy   Paraparesis ([facility])  Diffuse large B-cell lymphoma She toleratedchemo well so far No signs of tumor lysis so far She will continue allopurinol for tumor lysis prophylaxis She willcontinue onacyclovir for antimicrobial  prophylaxis  Recent cord compression status post decompression surgery She had mild residual neurological deficit from recent surgery She will continuePT at home after discharge  Mild pancytopenia Will monitor G-CSF next week  Mild hyponatremia and hypokalemia, resolved Asymptomatic. Continue close monitoring.    Mild back pain Analgesics as needed   Diet:  Regular  Activity:  As tolerated  Condition at Discharge:   stable  Signed: Dr. Heath Lark [phone] Spent 35 minutes on discharge  01/17/2017, 10:16 AM

## 2017-01-18 NOTE — Progress Notes (Signed)
Pt discharged home with sister in stable condition. Discharge instructions given. Pt with upcoming Monday Dec. 3rd appt with Dr. Alvy Bimler. Pt verbalized understanding. No immediate questions or concerns at this time.

## 2017-01-20 ENCOUNTER — Encounter: Payer: Self-pay | Admitting: Hematology and Oncology

## 2017-01-20 ENCOUNTER — Ambulatory Visit (HOSPITAL_BASED_OUTPATIENT_CLINIC_OR_DEPARTMENT_OTHER): Payer: Medicare Other

## 2017-01-20 ENCOUNTER — Ambulatory Visit (HOSPITAL_BASED_OUTPATIENT_CLINIC_OR_DEPARTMENT_OTHER): Payer: Medicare Other | Admitting: Hematology and Oncology

## 2017-01-20 DIAGNOSIS — Z9889 Other specified postprocedural states: Secondary | ICD-10-CM

## 2017-01-20 DIAGNOSIS — K5909 Other constipation: Secondary | ICD-10-CM | POA: Diagnosis not present

## 2017-01-20 DIAGNOSIS — G8220 Paraplegia, unspecified: Secondary | ICD-10-CM

## 2017-01-20 DIAGNOSIS — C8330 Diffuse large B-cell lymphoma, unspecified site: Secondary | ICD-10-CM | POA: Diagnosis not present

## 2017-01-20 DIAGNOSIS — Z5189 Encounter for other specified aftercare: Secondary | ICD-10-CM | POA: Diagnosis not present

## 2017-01-20 MED ORDER — TRAMADOL HCL 50 MG PO TABS: 100.0000 mg | ORAL_TABLET | Freq: Four times a day (QID) | ORAL | 0 refills | Status: DC | PRN

## 2017-01-20 MED ORDER — PEGFILGRASTIM INJECTION 6 MG/0.6ML ~~LOC~~
6.0000 mg | PREFILLED_SYRINGE | Freq: Once | SUBCUTANEOUS | Status: AC
  Administered 2017-01-20: 6 mg via SUBCUTANEOUS
  Filled 2017-01-20: qty 0.6

## 2017-01-20 MED ORDER — MORPHINE SULFATE ER 15 MG PO TBCR: 15.0000 mg | EXTENDED_RELEASE_TABLET | Freq: Two times a day (BID) | ORAL | 0 refills | Status: DC

## 2017-01-20 MED ORDER — CYCLOBENZAPRINE HCL 10 MG PO TABS: 10.0000 mg | ORAL_TABLET | Freq: Three times a day (TID) | ORAL | 1 refills | Status: DC | PRN

## 2017-01-20 NOTE — Assessment & Plan Note (Signed)
She has persistent weakness from recent surgery She would continue home PT

## 2017-01-20 NOTE — Patient Instructions (Signed)
Pegfilgrastim injection What is this medicine? PEGFILGRASTIM (PEG fil gra stim) is a long-acting granulocyte colony-stimulating factor that stimulates the growth of neutrophils, a type of white blood cell important in the body's fight against infection. It is used to reduce the incidence of fever and infection in patients with certain types of cancer who are receiving chemotherapy that affects the bone marrow, and to increase survival after being exposed to high doses of radiation. This medicine may be used for other purposes; ask your health care provider or pharmacist if you have questions. COMMON BRAND NAME(S): Neulasta What should I tell my health care provider before I take this medicine? They need to know if you have any of these conditions: -kidney disease -latex allergy -ongoing radiation therapy -sickle cell disease -skin reactions to acrylic adhesives (On-Body Injector only) -an unusual or allergic reaction to pegfilgrastim, filgrastim, other medicines, foods, dyes, or preservatives -pregnant or trying to get pregnant -breast-feeding How should I use this medicine? This medicine is for injection under the skin. If you get this medicine at home, you will be taught how to prepare and give the pre-filled syringe or how to use the On-body Injector. Refer to the patient Instructions for Use for detailed instructions. Use exactly as directed. Tell your healthcare provider immediately if you suspect that the On-body Injector may not have performed as intended or if you suspect the use of the On-body Injector resulted in a missed or partial dose. It is important that you put your used needles and syringes in a special sharps container. Do not put them in a trash can. If you do not have a sharps container, call your pharmacist or healthcare provider to get one. Talk to your pediatrician regarding the use of this medicine in children. While this drug may be prescribed for selected conditions,  precautions do apply. Overdosage: If you think you have taken too much of this medicine contact a poison control center or emergency room at once. NOTE: This medicine is only for you. Do not share this medicine with others. What if I miss a dose? It is important not to miss your dose. Call your doctor or health care professional if you miss your dose. If you miss a dose due to an On-body Injector failure or leakage, a new dose should be administered as soon as possible using a single prefilled syringe for manual use. What may interact with this medicine? Interactions have not been studied. Give your health care provider a list of all the medicines, herbs, non-prescription drugs, or dietary supplements you use. Also tell them if you smoke, drink alcohol, or use illegal drugs. Some items may interact with your medicine. This list may not describe all possible interactions. Give your health care provider a list of all the medicines, herbs, non-prescription drugs, or dietary supplements you use. Also tell them if you smoke, drink alcohol, or use illegal drugs. Some items may interact with your medicine. What should I watch for while using this medicine? You may need blood work done while you are taking this medicine. If you are going to need a MRI, CT scan, or other procedure, tell your doctor that you are using this medicine (On-Body Injector only). What side effects may I notice from receiving this medicine? Side effects that you should report to your doctor or health care professional as soon as possible: -allergic reactions like skin rash, itching or hives, swelling of the face, lips, or tongue -dizziness -fever -pain, redness, or irritation at site   where injected -pinpoint red spots on the skin -red or dark-brown urine -shortness of breath or breathing problems -stomach or side pain, or pain at the shoulder -swelling -tiredness -trouble passing urine or change in the amount of urine Side  effects that usually do not require medical attention (report to your doctor or health care professional if they continue or are bothersome): -bone pain -muscle pain This list may not describe all possible side effects. Call your doctor for medical advice about side effects. You may report side effects to FDA at 1-800-FDA-1088. Where should I keep my medicine? Keep out of the reach of children. Store pre-filled syringes in a refrigerator between 2 and 8 degrees C (36 and 46 degrees F). Do not freeze. Keep in carton to protect from light. Throw away this medicine if it is left out of the refrigerator for more than 48 hours. Throw away any unused medicine after the expiration date. NOTE: This sheet is a summary. It may not cover all possible information. If you have questions about this medicine, talk to your doctor, pharmacist, or health care provider.  2018 [employer]vier/Gold Standard (2016-02-01 12:58:03)  

## 2017-01-20 NOTE — Assessment & Plan Note (Signed)
She has completed cycle 1 of chemotherapy She will receive Neulasta today I plan to see her again in 2 weeks with plan for cycle 2 of treatment on February 03, 2017

## 2017-01-20 NOTE — Progress Notes (Signed)
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 Extensive mesenteric, retroperitoneal, left hilar, mediastinal, left supraclavicular and left axillary adenopathy. Differential considerations include metastatic adenopathy, lymphoma and leukemia. The size of the nodes in the mesentery and retroperitoneum are suggestive of non-Hodgkin's lymphoma. 2. 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 Differential considerations include metastatic disease and lymphoma. A left upper lobe primary lung carcinoma is a possibility. 3. 3.2 cm solid right renal mass. Differential considerations include renal cell carcinoma and oncocytoma. 4. Small amount of free peritoneal fluid, most likely due to lymphatic obstruction by the mesenteric adenopathy. 5. Left fourth rib fracture. 6. Colonic diverticulosis. 7. Calcific coronary artery and aortic atherosclerosis. Aortic Atherosclerosis (ICD10-I70.0).       12/12/2016 - 12/14/2016 Hospital Admission    He was briefly admitted and evaluated for weakness.  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 Tumor fills the right neural foramina at T6-7 and T7-8 and completely replaces the T7 vertebral body.      12/18/2016 Surgery    She underwent procedure: Decompressive thoracic laminectomy from T5-T8 for resection of epidural tumor        12/18/2016 Pathology Results    1. Soft tissue mass, simple excision, thoracic dorsal epidural  mass - DIFFUSE LARGE B-CELL LYMPHOMA. - SEE ONCOLOGY TABLE. 2. Soft tissue mass, simple excision, thoracic dorsal epidural mass - DIFFUSE LARGE B-CELL LYMPHOMA. Microscopic Comment 1. LYMPHOMA Histologic type: Non-Hodgkin B-cell lymphoma: Diffuse large B-cell lymphoma. Grade (if applicable): High grade. Flow cytometry: N/A. Immunohistochemical stains: CD20. CD3, CD5, CD10, CD23, CD30, CD15, PAX-5, Ki-67, bcl-2, bcl-6, CD34. Touch preps/imprints: N/A. Comments: There is a diffuse infiltrate of atypical lymphocytes. The lymphocytes are medium to large in size with irregular nuclear contours. There are background smaller lymphocytes. While there are admixed smaller cells, there are diffuse areas of large cells. The large lymphocytes are positive for CD20, CD10, bcl-6, and bcl-2. CD3 and CD5 highlight admixed T-cells. CD34, CD30, CD23, and CD15 are negative. PAX-5 is positive. Ki-67 is variable with areas up to 70%. 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       12/18/2016 - 01/17/2017 Hospital Admission    The patient was readmitted to the hospital due to complete weakness secondary to spinal cord compression.  She underwent surgery and pathology report confirmed diagnosis of diffuse large B-cell lymphoma. She received IT chemo on 01/16/17      01/16/2017 Pathology Results    CEREBROSPINAL FLUID (SPECIMEN 1 OF 1 COLLECTED 01/16/17): NO MALIGNANT CELLS IDENTIFIED.       INTERVAL HISTORY: Please see below for problem oriented charting. She returns for further follow-up She complained of constipation over the weekend She has mild pain in her back especially at nighttime Physical therapist will be visiting her soon for home PT She denies recent fever or  chills  REVIEW OF SYSTEMS:   Constitutional: Denies fevers,  chills or abnormal weight loss Eyes: Denies blurriness of vision Ears, nose, mouth, throat, and face: Denies mucositis or sore throat Respiratory: Denies cough, dyspnea or wheezes Cardiovascular: Denies palpitation, chest discomfort or lower extremity swelling Skin: Denies abnormal skin rashes Lymphatics: Denies new lymphadenopathy or easy bruising Behavioral/Psych: Mood is stable, no new changes  All other systems were reviewed with the patient and are negative.  I have reviewed the past medical history, past surgical history, social history and family history with the patient and they are unchanged from previous note.  ALLERGIES:  is allergic to contrast media [iodinated diagnostic agents].  MEDICATIONS:  Current Outpatient Medications  Medication Sig Dispense Refill  . acyclovir (ZOVIRAX) 400 MG tablet Take 1 tablet (400 mg total) by mouth 2 (two) times daily. 60 tablet 1  . allopurinol (ZYLOPRIM) 300 MG tablet Take 1 tablet (300 mg total) by mouth daily. 30 tablet 0  . amLODipine (NORVASC) 10 MG tablet Take 1 tablet (10 mg total) by mouth daily. 30 tablet 0  . cyclobenzaprine (FLEXERIL) 10 MG tablet Take 1 tablet (10 mg total) by mouth 3 (three) times daily as needed for muscle spasms. 90 tablet 1  . docusate sodium (COLACE) 100 MG capsule Take 1 capsule (100 mg total) by mouth 2 (two) times daily. 60 capsule 0  . famotidine (PEPCID) 20 MG tablet Take 1 tablet (20 mg total) by mouth daily. 30 tablet 0  . lidocaine-prilocaine (EMLA) cream Apply 1 application topically as needed. 30 g 6  . morphine (MS CONTIN) 15 MG 12 hr tablet Take 1 tablet (15 mg total) by mouth every 12 (twelve) hours. 60 tablet 0  . ondansetron (ZOFRAN) 8 MG tablet Take 1 tablet (8 mg total) by mouth every 8 (eight) hours as needed for nausea (not responsive to prochlorperazine (COMPAZINE)). 60 tablet 1  . prochlorperazine (COMPAZINE) 10 MG tablet Take 1 tablet (10 mg total) by mouth every 6 (six) hours as needed for  nausea or vomiting. 60 tablet 1  . traMADol (ULTRAM) 50 MG tablet Take 2 tablets (100 mg total) by mouth every 6 (six) hours as needed for severe pain. 90 tablet 0   No current facility-administered medications for this visit.     PHYSICAL EXAMINATION: ECOG PERFORMANCE STATUS: 2 - Symptomatic, <50% confined to bed  Vitals:   01/20/17 1034  BP: (!) 148/74  Pulse: (!) 105  Resp: 18  Temp: 98.2 F (36.8 C)  SpO2: 98%   Filed Weights   01/20/17 1034  Weight: 234 lb 9.6 oz (106.4 kg)    GENERAL:alert, no distress and comfortable SKIN: skin color, texture, turgor are normal, no rashes or significant lesions EYES: normal, Conjunctiva are pink and non-injected, sclera clear Musculoskeletal:no cyanosis of digits and no clubbing  NEURO: alert & oriented x 3 with fluent speech, no focal motor/sensory deficits  LABORATORY DATA:  I have reviewed the data as listed    Component Value Date/Time   NA 136 01/16/2017 0530   K 3.5 01/16/2017 0530   CL 105 01/16/2017 0530   CO2 26 01/16/2017 0530   GLUCOSE 123 (H) 01/16/2017 0530   BUN 15 01/16/2017 0530   CREATININE 0.56 01/16/2017 0530   CREATININE 0.79 10/22/2013 0917   CALCIUM 8.2 (L) 01/16/2017 0530   PROT 6.1 (L) 01/14/2017 0715   ALBUMIN 3.3 (L) 01/14/2017 0715   AST 20 01/14/2017 0715   ALT 20 01/14/2017 0715   ALKPHOS  62 01/14/2017 0715   BILITOT 0.6 01/14/2017 0715   GFRNONAA >60 01/16/2017 0530   GFRAA >60 01/16/2017 0530    No results found for: SPEP, UPEP  Lab Results  Component Value Date   WBC 3.8 (L) 01/16/2017   NEUTROABS 3.4 01/16/2017   HGB 9.0 (L) 01/16/2017   HCT 26.5 (L) 01/16/2017   MCV 82.0 01/16/2017   PLT 171 01/16/2017      Chemistry      Component Value Date/Time   NA 136 01/16/2017 0530   K 3.5 01/16/2017 0530   CL 105 01/16/2017 0530   CO2 26 01/16/2017 0530   BUN 15 01/16/2017 0530   CREATININE 0.56 01/16/2017 0530   CREATININE 0.79 10/22/2013 0917      Component Value Date/Time    CALCIUM 8.2 (L) 01/16/2017 0530   ALKPHOS 62 01/14/2017 0715   AST 20 01/14/2017 0715   ALT 20 01/14/2017 0715   BILITOT 0.6 01/14/2017 0715       RADIOGRAPHIC STUDIES: I have personally reviewed the radiological images as listed and agreed with the findings in the report. 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 EXAM: IMPLANTED PORT A CATH PLACEMENT WITH ULTRASOUND AND FLUOROSCOPIC GUIDANCE ANESTHESIA/SEDATION: 2.0 mg IV Versed; 100 mcg IV Fentanyl Total Moderate Sedation Time:  33 minutes The patient's level of consciousness and physiologic status were continuously monitored during the procedure by Radiology nursing. Additional Medications: 2 g IV Ancef. FLUOROSCOPY TIME:  24 seconds.  2.0 mGy. PROCEDURE: The procedure, risks, benefits, and alternatives were explained to the patient. Questions regarding the procedure were encouraged and answered. The patient understands and consents to the procedure. A time-out was performed prior to initiating the procedure. Ultrasound was utilized to confirm patency of the right internal jugular vein. The right neck and chest were prepped with chlorhexidine in a sterile fashion, and a sterile drape was applied covering the operative field. Maximum barrier sterile technique with sterile gowns and gloves were used for the procedure. Local anesthesia was provided with 1% lidocaine. After creating a small venotomy incision, a 21 gauge needle was advanced into the right internal jugular vein under direct, real-time ultrasound guidance. Ultrasound image documentation was performed. After securing guidewire access, an 8 Fr dilator was placed. A J-wire was kinked to measure appropriate catheter length. A subcutaneous port pocket was then created along the upper chest wall utilizing sharp and blunt dissection. Portable cautery was utilized. The pocket was irrigated  with sterile saline. A single lumen power injectable port was chosen for placement. The 8 Fr catheter was tunneled from the port pocket site to the venotomy incision. The port was placed in the pocket. External catheter was trimmed to appropriate length based on guidewire measurement. At the venotomy, an 8 Fr peel-away sheath was placed over a guidewire. The catheter was then placed through the sheath and the sheath removed. Final catheter positioning was confirmed and documented with a fluoroscopic spot image. The port was accessed with a needle and aspirated and flushed with heparinized saline. The access needle was removed. The venotomy and port pocket incisions were closed with subcutaneous 3-0 Monocryl and subcuticular 4-0 Vicryl. Dermabond was applied to both incisions. COMPLICATIONS: COMPLICATIONS None FINDINGS: After catheter placement, the tip lies at the cavo-atrial junction. The catheter aspirates normally and is ready for immediate use. IMPRESSION: Placement of single lumen port a cath via right internal jugular  vein. The catheter tip lies at the cavo-atrial junction. A power injectable port a cath was placed and is ready for immediate use. Electronically Signed   By: Aletta Edouard M.D.   On: 12/30/2016 13:10   Dg Fluoro Guided Needle Plc Aspiration/injection Loc  Result Date: 01/16/2017 CLINICAL DATA:  B-cell lymphoma EXAM: FLUOROSCOPICALLY GUIDED LUMBAR PUNCTURE FOR INTRATHECAL CHEMOTHERAPY TECHNIQUE: Informed consent was obtained from the patient prior to the procedure, including potential complications of headache, allergy, and pain. A 'time out' was performed. With the patient prone, the lower back was prepped with Betadine. 1% Lidocaine was used for local anesthesia. Lumbar puncture was performed at the L3-4 using a 20 gauge needle with return of clear CSF. 8 cc clear CSF S collected for testing. 12 mg Of methotrexate was injected into the subarachnoid space. The patient tolerated the  procedure well without apparent complication. FLUOROSCOPY TIME:  13 seconds IMPRESSION: Intrathecal injection of chemotherapy without complication Electronically Signed   By: Rolm Baptise M.D.   On: 01/16/2017 10:11   Ir Fluoro Guide Port Insertion Right  Result Date: 12/30/2016 CLINICAL DATA:  Diffuse large B-cell lymphoma and need for porta cath to begin chemotherapy. EXAM: IMPLANTED PORT A CATH PLACEMENT WITH ULTRASOUND AND FLUOROSCOPIC GUIDANCE ANESTHESIA/SEDATION: 2.0 mg IV Versed; 100 mcg IV Fentanyl Total Moderate Sedation Time:  33 minutes The patient's level of consciousness and physiologic status were continuously monitored during the procedure by Radiology nursing. Additional Medications: 2 g IV Ancef. FLUOROSCOPY TIME:  24 seconds.  2.0 mGy. PROCEDURE: The procedure, risks, benefits, and alternatives were explained to the patient. Questions regarding the procedure were encouraged and answered. The patient understands and consents to the procedure. A time-out was performed prior to initiating the procedure. Ultrasound was utilized to confirm patency of the right internal jugular vein. The right neck and chest were prepped with chlorhexidine in a sterile fashion, and a sterile drape was applied covering the operative field. Maximum barrier sterile technique with sterile gowns and gloves were used for the procedure. Local anesthesia was provided with 1% lidocaine. After creating a small venotomy incision, a 21 gauge needle was advanced into the right internal jugular vein under direct, real-time ultrasound guidance. Ultrasound image documentation was performed. After securing guidewire access, an 8 Fr dilator was placed. A J-wire was kinked to measure appropriate catheter length. A subcutaneous port pocket was then created along the upper chest wall utilizing sharp and blunt dissection. Portable cautery was utilized. The pocket was irrigated with sterile saline. A single lumen power injectable port was  chosen for placement. The 8 Fr catheter was tunneled from the port pocket site to the venotomy incision. The port was placed in the pocket. External catheter was trimmed to appropriate length based on guidewire measurement. At the venotomy, an 8 Fr peel-away sheath was placed over a guidewire. The catheter was then placed through the sheath and the sheath removed. Final catheter positioning was confirmed and documented with a fluoroscopic spot image. The port was accessed with a needle and aspirated and flushed with heparinized saline. The access needle was removed. The venotomy and port pocket incisions were closed with subcutaneous 3-0 Monocryl and subcuticular 4-0 Vicryl. Dermabond was applied to both incisions. COMPLICATIONS: COMPLICATIONS None FINDINGS: After catheter placement, the tip lies at the cavo-atrial junction. The catheter aspirates normally and is ready for immediate use. IMPRESSION: Placement of single lumen port a cath via right internal jugular vein. The catheter tip lies at the cavo-atrial junction. A power injectable  port a cath was placed and is ready for immediate use. Electronically Signed   By: Aletta Edouard M.D.   On: 12/30/2016 13:10    ASSESSMENT & PLAN:  Diffuse large B cell lymphoma ([place]) She has completed cycle 1 of chemotherapy She will receive Neulasta today I plan to see her again in 2 weeks with plan for cycle 2 of treatment on February 03, 2017  Paraparesis [facility]) She has persistent weakness from recent surgery She would continue home PT  S/P lumbar laminectomy She has persistent pain secondary to prior surgery and cancer I refill her prescription tramadol and prescribed long-acting pain medicine for her to take at night  Constipation She has acute on chronic constipation We discussed laxative management today   No orders of the defined types were placed in this encounter.  All questions were answered. The patient knows to call the clinic with any  problems, questions or concerns. No barriers to learning was detected. I spent 15 minutes counseling the patient face to face. The total time spent in the appointment was 20 minutes and more than 50% was on counseling and review of test results     Heath Lark, MD 01/20/2017 12:15 PM

## 2017-01-20 NOTE — Assessment & Plan Note (Signed)
She has persistent pain secondary to prior surgery and cancer I refill her prescription tramadol and prescribed long-acting pain medicine for her to take at night

## 2017-01-20 NOTE — Assessment & Plan Note (Signed)
She has acute on chronic constipation We discussed laxative management today

## 2017-01-22 ENCOUNTER — Telehealth: Payer: Self-pay

## 2017-01-22 NOTE — Telephone Encounter (Signed)
Katrina [name] PT [facility] called requesting verbal orders for 3xwk X 2wks, then a short break while patient receives kemotherapy, then will call back to resume PT.  Called her back and approved verbal orders

## 2017-01-29 ENCOUNTER — Encounter: Payer: Self-pay | Admitting: Physical Medicine & Rehabilitation

## 2017-01-29 ENCOUNTER — Other Ambulatory Visit: Payer: Self-pay

## 2017-01-29 ENCOUNTER — Encounter: Payer: Medicare Other | Attending: Physical Medicine & Rehabilitation | Admitting: Physical Medicine & Rehabilitation

## 2017-01-29 VITALS — BP 128/78 | HR 106

## 2017-01-29 DIAGNOSIS — M4714 Other spondylosis with myelopathy, thoracic region: Secondary | ICD-10-CM | POA: Insufficient documentation

## 2017-01-29 DIAGNOSIS — G8220 Paraplegia, unspecified: Secondary | ICD-10-CM

## 2017-01-29 DIAGNOSIS — R918 Other nonspecific abnormal finding of lung field: Secondary | ICD-10-CM | POA: Diagnosis not present

## 2017-01-29 DIAGNOSIS — K592 Neurogenic bowel, not elsewhere classified: Secondary | ICD-10-CM | POA: Diagnosis not present

## 2017-01-29 DIAGNOSIS — I10 Essential (primary) hypertension: Secondary | ICD-10-CM | POA: Diagnosis not present

## 2017-01-29 DIAGNOSIS — K5900 Constipation, unspecified: Secondary | ICD-10-CM | POA: Insufficient documentation

## 2017-01-29 NOTE — Patient Instructions (Signed)
MAKE SURE YOU MAINTAIN DAILY EXERCISE AT HOME (EVEN IF IT'S A SMALL AMOUNT)  KEEP WORKING ON ADEQUATE DIETARY INTAKE, ESPECIALLY PROTEIN   PLEASE FEEL FREE TO CALL OUR OFFICE WITH ANY PROBLEMS OR QUESTIONS ([phone])  HAVE A HAPPY HOLIDAYS!                     ^                  ^^                ^ ^ ^             ^ ^ ^ ^ ^           ^ ^ ^ ^ ^ ^ ^        ^ ^ ^ [place]      [place] Marland Kitchen                Marland Kitchen                Marland Kitchen                Marland Kitchen

## 2017-01-29 NOTE — Progress Notes (Signed)
Subjective:    Patient ID: Katrina [name], female    DOB: [birth year], 66 y.o.   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  She was discharged from [facility] inpatient rehab on November 20 and then returned to [facility] on November 26 through November 30 for cycle 1 of her chemotherapy. Second round of ctx is next week.  She has been home since then and receiving home health therapies through advanced home care. she continues to utilize a w/c for longer dx mobility. She is using walker at home and has walked up to 35'. She uses her w/c for most mobility around the house. She fell transferring to the toilet last week when her legs got caught up. No sequelae fortunately.   She has had some issues with constipation but this has been a little better since she stopped the iron supplement. Bladder is emptying relatively as well.  She is also struggled at times with her appetite since starting the chemo.  She states that really nothing has much taste.  She is tried to maintain her weight by using supplements, etc.  For pain she's using flexeril as well as tramadol, typically 2-3 x daily. She stopped the ms contin as she wasn't needing them any more.    Pain Inventory Average Pain 5 Pain Right Now 6 My pain is stabbing and aching  In the last 24 hours, has pain interfered with the following? General activity 5 Relation with others 5 Enjoyment of life 5 What TIME of day is your pain at its worst? night Sleep (in general) Good  Pain is worse with: some activites Pain improves with: rest Relief from Meds: 2  Mobility walk with assistance use a walker ability to climb steps?  no do you drive?  no use a wheelchair  Function retired I need assistance with the following:  dressing, bathing, toileting and meal prep  Neuro/Psych tingling trouble walking spasms depression loss of taste or smell  Prior  Studies Any changes since last visit?  no  Physicians involved in your care Any changes since last visit?  no   Family History  Problem Relation Age of Onset  . Colon cancer Sister        at age 26  . Colon cancer Brother        at age 29  . COPD Mother   . Diabetes Mellitus II Mother   . CAD Mother        Angina  . Colon cancer Father   . Thyroid disease Sister   . Diabetes Mellitus II Sister    Social History   Socioeconomic History  . Marital status: Widowed    Spouse name: None  . Number of children: None  . Years of education: None  . Highest education level: None  Social Needs  . Financial resource strain: None  . Food insecurity - worry: None  . Food insecurity - inability: None  . Transportation needs - medical: None  . Transportation needs - non-medical: None  Occupational History  . None  Tobacco Use  . Smoking status: Never Smoker  . Smokeless tobacco: Never Used  Substance and Sexual Activity  . Alcohol use: No  . Drug use: No  . Sexual activity: No  Other Topics Concern  . None  Social History Narrative  . None   Past Surgical History:  Procedure Laterality Date  .  ABDOMINAL HYSTERECTOMY    . BREAST LUMPECTOMY     left  . COLONOSCOPY N/A 11/02/2013   Procedure: COLONOSCOPY;  Surgeon: Danie Binder, MD;  [facility];  Service: Endoscopy;  Laterality: N/A;  1:15 - moved to [place] notified pt  . IR FLUORO GUIDE PORT INSERTION RIGHT  12/30/2016  . IR US GUIDE VASC ACCESS RIGHT  12/30/2016  . LAMINECTOMY N/A 12/18/2016   Procedure: DECOMPRESSION T5-8/THORACIC;  Surgeon: Kary Kos, MD;  Location: [place];  Service: Neurosurgery;  Laterality: N/A;  Decompressive Thoracic Laminectomy, Thoracic five - six, Thoraic six - seven, Thoracic seven - eight  . SHOULDER SURGERY     right   Past Medical History:  Diagnosis Date  . Anemia   . Arthritis   . 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 Hypertension   . Lymphadenopathy 11/2016   "extensive"  .  Paresthesia of lower extremity 11/2016   bilateral  . Pulmonary nodules 11/2016  . Right renal mass 11/2016   BP 128/78   Pulse (!) 106   SpO2 94%   Opioid Risk Score:   Fall Risk Score:  `1  Depression screen PHQ 2/9  Depression screen PHQ 2/9 01/29/2017  Decreased Interest 1  Down, Depressed, Hopeless 1  PHQ - 2 Score 2      Review of Systems  Constitutional: Negative.   HENT: Negative.   Eyes: Negative.   Respiratory: Negative.   Cardiovascular: Negative.   Gastrointestinal: Negative.   Endocrine: Negative.   Genitourinary: Negative.   Musculoskeletal: Negative.   Skin: Negative.   Allergic/Immunologic: Negative.   Neurological: Negative.   Hematological: Negative.   Psychiatric/Behavioral: Negative.        Objective:   Physical Exam General: No distress HEENT: Normocephalic, atraumatic. Cardio:   Regular rate     Resp: Normal effort GI:  BS +, non-tender, non-distended  Skin:   Wound C/D/I  Neuro: Alert/Oriented Motor 5/5 bilateral deltoid, bicep, tricep, grip 4+/5 left hip flexor, knee extensors, ankle dorsi/plantar flexors.  She is 4 out of 5 left hip flexor knee extensor knee flexor and ankle dorsiflexion plantarflexion. -Decreased proprioception and light touch in both lower extremities with sensory level just below the umbilicus Musc/Skel: No edema.  No tenderness. Psych: Pleasant but appears a bit flat.        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  I definitely want her up out of bed every day and to work on some sort of exercise on a daily basis no matter how small it is.  Would like to look at transitioning her to outpatient therapy at some point when she is completed her chemotherapy  -Discussed the importance of nutrition, especially getting enough protein.  She is doing a fairly good job with this  taking supplements etc.   2. Pain Management:tramadol and flexeril prn, she is not using MS Contin at this point 3. Mood:Family helping to provide ego support.  I told the patient if he needs any assistance in this area that she should call me or her oncological team 4.. Neurogenic bowel: Improved off of iron supplement.  Monitor for now  15 minutes of direct patient time was spent today.  I will see her back in about 2 months

## 2017-01-30 ENCOUNTER — Encounter: Payer: Self-pay | Admitting: *Deleted

## 2017-01-31 ENCOUNTER — Ambulatory Visit (HOSPITAL_COMMUNITY)
Admission: RE | Admit: 2017-01-31 | Discharge: 2017-01-31 | Disposition: A | Discharge status: Home / Self Care | Payer: Medicare Other | Source: Ambulatory Visit | Attending: Hematology and Oncology | Admitting: Hematology and Oncology

## 2017-01-31 ENCOUNTER — Telehealth: Payer: Self-pay | Admitting: Hematology and Oncology

## 2017-01-31 ENCOUNTER — Telehealth: Payer: Self-pay | Admitting: *Deleted

## 2017-01-31 ENCOUNTER — Other Ambulatory Visit: Payer: Self-pay | Admitting: *Deleted

## 2017-01-31 ENCOUNTER — Other Ambulatory Visit (HOSPITAL_BASED_OUTPATIENT_CLINIC_OR_DEPARTMENT_OTHER): Payer: Medicare Other

## 2017-01-31 ENCOUNTER — Ambulatory Visit (HOSPITAL_BASED_OUTPATIENT_CLINIC_OR_DEPARTMENT_OTHER): Payer: Medicare Other | Admitting: *Deleted

## 2017-01-31 ENCOUNTER — Encounter: Payer: Self-pay | Admitting: Hematology and Oncology

## 2017-01-31 ENCOUNTER — Ambulatory Visit (HOSPITAL_BASED_OUTPATIENT_CLINIC_OR_DEPARTMENT_OTHER): Payer: Medicare Other | Admitting: Hematology and Oncology

## 2017-01-31 ENCOUNTER — Other Ambulatory Visit: Payer: Medicare Other

## 2017-01-31 VITALS — BP 135/69 | HR 107 | Temp 97.7°F | Resp 18 | Ht 68.0 in

## 2017-01-31 VITALS — BP 145/63 | HR 86 | Temp 97.7°F | Resp 17

## 2017-01-31 DIAGNOSIS — R197 Diarrhea, unspecified: Secondary | ICD-10-CM

## 2017-01-31 DIAGNOSIS — D62 Acute posthemorrhagic anemia: Secondary | ICD-10-CM

## 2017-01-31 DIAGNOSIS — E860 Dehydration: Secondary | ICD-10-CM

## 2017-01-31 DIAGNOSIS — T451X5A Adverse effect of antineoplastic and immunosuppressive drugs, initial encounter: Secondary | ICD-10-CM

## 2017-01-31 DIAGNOSIS — C8330 Diffuse large B-cell lymphoma, unspecified site: Secondary | ICD-10-CM

## 2017-01-31 DIAGNOSIS — D6481 Anemia due to antineoplastic chemotherapy: Secondary | ICD-10-CM

## 2017-01-31 LAB — CBC WITH DIFFERENTIAL/PLATELET
BASO%: 0.5 % (ref 0.0–2.0)
Basophils Absolute: 0.1 10*3/uL (ref 0.0–0.1)
EOS ABS: 0 10*3/uL (ref 0.0–0.5)
EOS%: 0.2 % (ref 0.0–7.0)
HEMATOCRIT: 24.7 % — AB (ref 34.8–46.6)
HEMOGLOBIN: 8 g/dL — AB (ref 11.6–15.9)
LYMPH%: 6.8 % — AB (ref 14.0–49.7)
MCH: 26.5 pg (ref 25.1–34.0)
MCHC: 32.4 g/dL (ref 31.5–36.0)
MCV: 81.8 fL (ref 79.5–101.0)
MONO#: 1.3 10*3/uL — AB (ref 0.1–0.9)
MONO%: 7 % (ref 0.0–14.0)
NEUT%: 85.5 % — AB (ref 38.4–76.8)
NEUTROS ABS: 15.5 10*3/uL — AB (ref 1.5–6.5)
PLATELETS: 532 10*3/uL — AB (ref 145–400)
RBC: 3.02 10*6/uL — ABNORMAL LOW (ref 3.70–5.45)
RDW: 17.8 % — ABNORMAL HIGH (ref 11.2–14.5)
WBC: 18.2 10*3/uL — AB (ref 3.9–10.3)
lymph#: 1.2 10*3/uL (ref 0.9–3.3)
nRBC: 1 % — ABNORMAL HIGH (ref 0–0)

## 2017-01-31 LAB — LACTATE DEHYDROGENASE: LDH: 371 U/L — ABNORMAL HIGH (ref 125–245)

## 2017-01-31 LAB — COMPREHENSIVE METABOLIC PANEL
ALT: 10 U/L (ref 0–55)
AST: 16 U/L (ref 5–34)
Albumin: 3 g/dL — ABNORMAL LOW (ref 3.5–5.0)
Alkaline Phosphatase: 85 U/L (ref 40–150)
Anion Gap: 14 mEq/L — ABNORMAL HIGH (ref 3–11)
BUN: 7.7 mg/dL (ref 7.0–26.0)
CHLORIDE: 91 meq/L — AB (ref 98–109)
CO2: 26 mEq/L (ref 22–29)
Calcium: 8.9 mg/dL (ref 8.4–10.4)
Creatinine: 0.8 mg/dL (ref 0.6–1.1)
EGFR: >60 mL/min/{1.73_m2} (ref >60)
GLUCOSE: 119 mg/dL (ref 70–140)
POTASSIUM: 3.3 meq/L — AB (ref 3.5–5.1)
SODIUM: 132 meq/L — AB (ref 136–145)
Total Bilirubin: 0.63 mg/dL (ref 0.20–1.20)
Total Protein: 6.7 g/dL (ref 6.4–8.3)

## 2017-01-31 LAB — PREPARE RBC (CROSSMATCH)

## 2017-01-31 LAB — ABO/RH: ABO/RH(D): O NEG

## 2017-01-31 MED ORDER — ACETAMINOPHEN 325 MG PO TABS
650.0000 mg | ORAL_TABLET | Freq: Once | ORAL | Status: AC
Start: 2017-01-31 — End: 2017-01-31
  Administered 2017-01-31: 650 mg via ORAL

## 2017-01-31 MED ORDER — SODIUM CHLORIDE 0.9% FLUSH
10.0000 mL | INTRAVENOUS | Status: DC | PRN
  Administered 2017-01-31: 10 mL
  Filled 2017-01-31: qty 10

## 2017-01-31 MED ORDER — DIPHENHYDRAMINE HCL 25 MG PO CAPS
ORAL_CAPSULE | ORAL | Status: AC
  Filled 2017-01-31: qty 1

## 2017-01-31 MED ORDER — LIDOCAINE-PRILOCAINE 2.5-2.5 % EX CREA
1.0000 | TOPICAL_CREAM | CUTANEOUS | 6 refills | Status: DC | PRN
Start: 2017-01-31 — End: 2017-09-19

## 2017-01-31 MED ORDER — SODIUM CHLORIDE 0.9 % IV SOLN
Freq: Once | INTRAVENOUS | Status: AC
  Administered 2017-01-31: 13:00:00 via INTRAVENOUS

## 2017-01-31 MED ORDER — ACETAMINOPHEN 325 MG PO TABS
ORAL_TABLET | ORAL | Status: AC
  Filled 2017-01-31: qty 2

## 2017-01-31 MED ORDER — HEPARIN SOD (PORK) LOCK FLUSH 100 UNIT/ML IV SOLN
500.0000 [IU] | Freq: Once | INTRAVENOUS | Status: AC | PRN
  Administered 2017-01-31: 500 [IU]
  Filled 2017-01-31: qty 5

## 2017-01-31 MED ORDER — DIPHENHYDRAMINE HCL 25 MG PO CAPS
25.0000 mg | ORAL_CAPSULE | Freq: Once | ORAL | Status: AC
  Administered 2017-01-31: 25 mg via ORAL

## 2017-01-31 MED ORDER — SODIUM CHLORIDE 0.9 % IV SOLN
250.0000 mL | Freq: Once | INTRAVENOUS | Status: AC
  Administered 2017-01-31: 250 mL via INTRAVENOUS

## 2017-01-31 NOTE — Telephone Encounter (Signed)
Scheduled appt per 12/14 los - Gave patient AVS and calender per los. -  Unable treatment for 1/02 due to capped day - logged but went  ahead and scheduled for 1/04 just incase .

## 2017-01-31 NOTE — Assessment & Plan Note (Signed)
We discussed some of the risks, benefits, and alternatives of blood transfusions. The patient is symptomatic from anemia and the hemoglobin level is critically low.  Some of the side-effects to be expected including risks of transfusion reactions, chills, infection, syndrome of volume overload and risk of hospitalization from various reasons and the patient is willing to proceed and went ahead to sign consent today. We will proceed with 1 unit of blood

## 2017-01-31 NOTE — Assessment & Plan Note (Addendum)
She has side effects from treatment including constipation and then recently diarrhea, fatigue and severe anemia Overall, her symptoms are tolerable I would proceed to admit her on February 03, 2017 for cycle 2 of R-EPOCH chemotherapy with dose adjustment due to her side effects She will receive rituximab on day 3 and IT chemotherapy on day 4 She will return on February 10, 2017 for G-CSF support  I will see her back in the office after the new year for supportive care, transfusion as needed and visit I plan to repeat imaging study after 2 cycles of chemotherapy

## 2017-01-31 NOTE — Progress Notes (Signed)
[facility] OFFICE PROGRESS NOTE  Patient Care Team: Muse, Noel Journey., PA-C as PCP - General  SUMMARY OF ONCOLOGIC HISTORY:   Diffuse large B cell lymphoma ([place])   12/12/2016 Imaging    CT scan showed  1. Extensive mesenteric, retroperitoneal, left hilar, mediastinal, left supraclavicular and left axillary adenopathy. Differential considerations include metastatic adenopathy, lymphoma and leukemia. The size of the nodes in the mesentery and retroperitoneum are suggestive of non-Hodgkin's lymphoma. 2. 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 Differential considerations include metastatic disease and lymphoma. A left upper lobe primary lung carcinoma is a possibility. 3. 3.2 cm solid right renal mass. Differential considerations include renal cell carcinoma and oncocytoma. 4. Small amount of free peritoneal fluid, most likely due to lymphatic obstruction by the mesenteric adenopathy. 5. Left fourth rib fracture. 6. Colonic diverticulosis. 7. Calcific coronary artery and aortic atherosclerosis. Aortic Atherosclerosis (ICD10-I70.0).       12/12/2016 - 12/14/2016 Hospital Admission    He was briefly admitted and evaluated for weakness.  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 Tumor fills the right neural foramina at T6-7 and T7-8 and completely replaces the T7 vertebral body.      12/18/2016 Surgery    She underwent procedure: Decompressive thoracic laminectomy from T5-T8 for resection of epidural tumor        12/18/2016 Pathology Results    1. Soft tissue mass, simple excision, thoracic dorsal epidural  mass - DIFFUSE LARGE B-CELL LYMPHOMA. - SEE ONCOLOGY TABLE. 2. Soft tissue mass, simple excision, thoracic dorsal epidural mass - DIFFUSE LARGE B-CELL LYMPHOMA. Microscopic Comment 1. LYMPHOMA Histologic type: Non-Hodgkin B-cell lymphoma: Diffuse large B-cell lymphoma. Grade (if applicable): High grade. Flow cytometry: N/A. Immunohistochemical stains: CD20. CD3, CD5, CD10, CD23, CD30, CD15, PAX-5, Ki-67, bcl-2, bcl-6, CD34. Touch preps/imprints: N/A. Comments: There is a diffuse infiltrate of atypical lymphocytes. The lymphocytes are medium to large in size with irregular nuclear contours. There are background smaller lymphocytes. While there are admixed smaller cells, there are diffuse areas of large cells. The large lymphocytes are positive for CD20, CD10, bcl-6, and bcl-2. CD3 and CD5 highlight admixed T-cells. CD34, CD30, CD23, and CD15 are negative. PAX-5 is positive. Ki-67 is variable with areas up to 70%. 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       12/18/2016 - 01/17/2017 Hospital Admission    The patient was readmitted to the hospital due to complete weakness secondary to spinal cord compression.  She underwent surgery and pathology report confirmed diagnosis of diffuse large B-cell lymphoma. She received IT chemo on 01/16/17      01/16/2017 Pathology Results    CEREBROSPINAL FLUID (SPECIMEN 1 OF 1 COLLECTED 01/16/17): NO MALIGNANT CELLS IDENTIFIED.       INTERVAL HISTORY: Please see below for problem oriented charting. She returns to be seen prior to cycle 2 of chemotherapy She had originally constipation but then developed diarrhea over the next few days She felt weak with poor appetite She felt dehydrated She denies nausea or  vomiting No recent fever or chills Denies chest pain, shortness of  breath or presyncopal episodes  REVIEW OF SYSTEMS:   Constitutional: Denies fevers, chills or abnormal weight loss Eyes: Denies blurriness of vision Ears, nose, mouth, throat, and face: Denies mucositis or sore throat Respiratory: Denies cough, dyspnea or wheezes Cardiovascular: Denies palpitation, chest discomfort or lower extremity swelling Skin: Denies abnormal skin rashes Lymphatics: Denies new lymphadenopathy or easy bruising Behavioral/Psych: Mood is stable, no new changes  All other systems were reviewed with the patient and are negative.  I have reviewed the past medical history, past surgical history, social history and family history with the patient and they are unchanged from previous note.  ALLERGIES:  is allergic to contrast media [iodinated diagnostic agents].  MEDICATIONS:  Current Outpatient Medications  Medication Sig Dispense Refill  . acyclovir (ZOVIRAX) 400 MG tablet Take 1 tablet (400 mg total) by mouth 2 (two) times daily. 60 tablet 1  . allopurinol (ZYLOPRIM) 300 MG tablet Take 1 tablet (300 mg total) by mouth daily. 30 tablet 0  . amLODipine (NORVASC) 10 MG tablet Take 1 tablet (10 mg total) by mouth daily. 30 tablet 0  . cyclobenzaprine (FLEXERIL) 10 MG tablet Take 1 tablet (10 mg total) by mouth 3 (three) times daily as needed for muscle spasms. 90 tablet 1  . docusate sodium (COLACE) 100 MG capsule Take 1 capsule (100 mg total) by mouth 2 (two) times daily. 60 capsule 0  . famotidine (PEPCID) 20 MG tablet Take 1 tablet (20 mg total) by mouth daily. 30 tablet 0  . lidocaine-prilocaine (EMLA) cream Apply 1 application topically as needed. 30 g 6  . morphine (MS CONTIN) 15 MG 12 hr tablet Take 1 tablet (15 mg total) by mouth every 12 (twelve) hours. 60 tablet 0  . ondansetron (ZOFRAN) 8 MG tablet Take 1 tablet (8 mg total) by mouth every 8 (eight) hours as needed for nausea (not responsive to prochlorperazine (COMPAZINE)). 60 tablet 1  . prochlorperazine  (COMPAZINE) 10 MG tablet Take 1 tablet (10 mg total) by mouth every 6 (six) hours as needed for nausea or vomiting. 60 tablet 1  . traMADol (ULTRAM) 50 MG tablet Take 2 tablets (100 mg total) by mouth every 6 (six) hours as needed for severe pain. 90 tablet 0   No current facility-administered medications for this visit.    Facility-Administered Medications Ordered in Other Visits  Medication Dose Route Frequency Provider Last Rate Last Dose  . sodium chloride flush (NS) 0.9 % injection 10 mL  10 mL Intracatheter PRN Alvy Bimler, Britta Louth, MD   10 mL at 01/31/17 1646    PHYSICAL EXAMINATION: ECOG PERFORMANCE STATUS: 2 - Symptomatic, <50% confined to bed  Vitals:   01/31/17 1139  BP: 135/69  Pulse: (!) 107  Resp: 18  Temp: 97.7 F (36.5 C)  SpO2: 96%   Filed Weights    GENERAL:alert, no distress and comfortable.  She looks pale and debilitated SKIN: skin color, texture, turgor are normal, no rashes or significant lesions EYES: normal, Conjunctiva are pink and non-injected, sclera clear OROPHARYNX: Dry mucous membrane is noted NECK: supple, thyroid normal size, non-tender, without nodularity LYMPH:  no palpable lymphadenopathy in the cervical, axillary or inguinal LUNGS: clear to auscultation and percussion with normal breathing effort HEART: regular rate & rhythm and no murmurs with mild bilateral lower extremity edema ABDOMEN:abdomen soft, non-tender and normal bowel sounds Musculoskeletal:no cyanosis of digits and no clubbing  NEURO: alert & oriented x 3 with fluent  speech, no focal motor/sensory deficits  LABORATORY DATA:  I have reviewed the data as listed    Component Value Date/Time   NA 132 (L) 01/31/2017 1102   K 3.3 (L) 01/31/2017 1102   CL 105 01/16/2017 0530   CO2 26 01/31/2017 1102   GLUCOSE 119 01/31/2017 1102   BUN 7.7 01/31/2017 1102   CREATININE 0.8 01/31/2017 1102   CALCIUM 8.9 01/31/2017 1102   PROT 6.7 01/31/2017 1102   ALBUMIN 3.0 (L) 01/31/2017 1102   AST  16 01/31/2017 1102   ALT 10 01/31/2017 1102   ALKPHOS 85 01/31/2017 1102   BILITOT 0.63 01/31/2017 1102   GFRNONAA >60 01/16/2017 0530   GFRAA >60 01/16/2017 0530    No results found for: SPEP, UPEP  Lab Results  Component Value Date   WBC 18.2 (H) 01/31/2017   NEUTROABS 15.5 (H) 01/31/2017   HGB 8.0 (L) 01/31/2017   HCT 24.7 (L) 01/31/2017   MCV 81.8 01/31/2017   PLT 532 (H) 01/31/2017      Chemistry      Component Value Date/Time   NA 132 (L) 01/31/2017 1102   K 3.3 (L) 01/31/2017 1102   CL 105 01/16/2017 0530   CO2 26 01/31/2017 1102   BUN 7.7 01/31/2017 1102   CREATININE 0.8 01/31/2017 1102      Component Value Date/Time   CALCIUM 8.9 01/31/2017 1102   ALKPHOS 85 01/31/2017 1102   AST 16 01/31/2017 1102   ALT 10 01/31/2017 1102   BILITOT 0.63 01/31/2017 1102       RADIOGRAPHIC STUDIES: I have personally reviewed the radiological images as listed and agreed with the findings in the report. Dg Fluoro Guided Needle Plc Aspiration/injection Loc  Result Date: 01/16/2017 CLINICAL DATA:  B-cell lymphoma EXAM: FLUOROSCOPICALLY GUIDED LUMBAR PUNCTURE FOR INTRATHECAL CHEMOTHERAPY TECHNIQUE: Informed consent was obtained from the patient prior to the procedure, including potential complications of headache, allergy, and pain. A 'time out' was performed. With the patient prone, the lower back was prepped with Betadine. 1% Lidocaine was used for local anesthesia. Lumbar puncture was performed at the L3-4 using a 20 gauge needle with return of clear CSF. 8 cc clear CSF S collected for testing. 12 mg Of methotrexate was injected into the subarachnoid space. The patient tolerated the procedure well without apparent complication. FLUOROSCOPY TIME:  13 seconds IMPRESSION: Intrathecal injection of chemotherapy without complication Electronically Signed   By: Rolm Baptise M.D.   On: 01/16/2017 10:11    ASSESSMENT & PLAN:  Diffuse large B cell lymphoma ([place]) She has side effects from  treatment including constipation and then recently diarrhea, fatigue and severe anemia Overall, her symptoms are tolerable I would proceed to admit her on February 03, 2017 for cycle 2 of R-EPOCH chemotherapy with dose adjustment due to her side effects She will receive rituximab on day 3 and IT chemotherapy on day 4 She will return on February 10, 2017 for G-CSF support  I will see her back in the office after the new year for supportive care, transfusion as needed and visit I plan to repeat imaging study after 2 cycles of chemotherapy  Anemia due to antineoplastic chemotherapy We discussed some of the risks, benefits, and alternatives of blood transfusions. The patient is symptomatic from anemia and the hemoglobin level is critically low.  Some of the side-effects to be expected including risks of transfusion reactions, chills, infection, syndrome of volume overload and risk of hospitalization from various reasons and the patient is  willing to proceed and went ahead to sign consent today. We will proceed with 1 unit of blood  Diarrhea The cause is unknown but could be due to recent chemotherapy I will reduce the dose of doxorubicin, etoposide and Cytoxan  Dehydration Secondary to diarrhea I recommend IV fluid support   Orders Placed This Encounter  Procedures  . Type and screen    Standing Status:   Future    Number of Occurrences:   1    Standing Expiration Date:   01/31/2018  . Hold Tube, Blood Bank   All questions were answered. The patient knows to call the clinic with any problems, questions or concerns. No barriers to learning was detected. I spent 25 minutes counseling the patient face to face. The total time spent in the appointment was 40 minutes and more than 50% was on counseling and review of test results     Heath Lark, MD 01/31/2017 4:54 PM

## 2017-01-31 NOTE — Assessment & Plan Note (Signed)
Secondary to diarrhea I recommend IV fluid support

## 2017-01-31 NOTE — Progress Notes (Signed)
RN visit for IV Fluid and blood transfusion

## 2017-01-31 NOTE — Assessment & Plan Note (Signed)
The cause is unknown but could be due to recent chemotherapy I will reduce the dose of doxorubicin, etoposide and Cytoxan

## 2017-01-31 NOTE — Patient Instructions (Signed)

## 2017-01-31 NOTE — Telephone Encounter (Signed)
Receivied a call from [place] asking to extend PT 2 wk 2.  She does not have a PCP. It looks like we d/c from inpt rehab 01/07/17 with therapy.  But she went back in to the hosp 01/13/17 under [facility] and was discharged 01/17/17.   She is going to be readmitted for chemo again on Monday Bruce gave ok for the HHPT on 01/22/17 and you saw her in the office on 01/29/17..  Are you willing to sign the [facility] orders?  If so we need to call the PT back on Monday and let him know.

## 2017-02-01 NOTE — Telephone Encounter (Signed)
Yes I will sign.  She was only readmitted for chemotherapy.  She is going back for chemotherapy again soon

## 2017-02-03 ENCOUNTER — Telehealth: Payer: Self-pay | Admitting: Hematology and Oncology

## 2017-02-03 ENCOUNTER — Encounter (HOSPITAL_COMMUNITY): Payer: Self-pay

## 2017-02-03 ENCOUNTER — Inpatient Hospital Stay (HOSPITAL_COMMUNITY)
Admission: AD | Admit: 2017-02-03 | Discharge: 2017-02-07 | DRG: 847 | Disposition: A | Discharge status: Home / Self Care | Payer: Medicare Other | Source: Ambulatory Visit | Attending: Hematology and Oncology | Admitting: Hematology and Oncology

## 2017-02-03 ENCOUNTER — Other Ambulatory Visit: Payer: Self-pay

## 2017-02-03 DIAGNOSIS — K5909 Other constipation: Secondary | ICD-10-CM

## 2017-02-03 DIAGNOSIS — C8330 Diffuse large B-cell lymphoma, unspecified site: Secondary | ICD-10-CM | POA: Diagnosis present

## 2017-02-03 DIAGNOSIS — D6481 Anemia due to antineoplastic chemotherapy: Secondary | ICD-10-CM | POA: Diagnosis present

## 2017-02-03 DIAGNOSIS — Z5111 Encounter for antineoplastic chemotherapy: Principal | ICD-10-CM

## 2017-02-03 DIAGNOSIS — E876 Hypokalemia: Secondary | ICD-10-CM | POA: Diagnosis present

## 2017-02-03 DIAGNOSIS — E860 Dehydration: Secondary | ICD-10-CM

## 2017-02-03 DIAGNOSIS — R197 Diarrhea, unspecified: Secondary | ICD-10-CM | POA: Diagnosis present

## 2017-02-03 DIAGNOSIS — R0982 Postnasal drip: Secondary | ICD-10-CM | POA: Diagnosis not present

## 2017-02-03 DIAGNOSIS — T451X5A Adverse effect of antineoplastic and immunosuppressive drugs, initial encounter: Secondary | ICD-10-CM | POA: Diagnosis present

## 2017-02-03 DIAGNOSIS — G959 Disease of spinal cord, unspecified: Secondary | ICD-10-CM | POA: Diagnosis present

## 2017-02-03 DIAGNOSIS — D630 Anemia in neoplastic disease: Secondary | ICD-10-CM | POA: Diagnosis not present

## 2017-02-03 DIAGNOSIS — C9590 Leukemia, unspecified not having achieved remission: Secondary | ICD-10-CM | POA: Diagnosis present

## 2017-02-03 DIAGNOSIS — I10 Essential (primary) hypertension: Secondary | ICD-10-CM | POA: Diagnosis present

## 2017-02-03 DIAGNOSIS — M4714 Other spondylosis with myelopathy, thoracic region: Secondary | ICD-10-CM | POA: Diagnosis present

## 2017-02-03 DIAGNOSIS — Z90710 Acquired absence of both cervix and uterus: Secondary | ICD-10-CM

## 2017-02-03 DIAGNOSIS — R0981 Nasal congestion: Secondary | ICD-10-CM | POA: Diagnosis not present

## 2017-02-03 DIAGNOSIS — Z79899 Other long term (current) drug therapy: Secondary | ICD-10-CM | POA: Diagnosis not present

## 2017-02-03 LAB — CBC WITH DIFFERENTIAL/PLATELET
BASOS ABS: 0.1 10*3/uL (ref 0.0–0.1)
Basophils Relative: 1 %
Eosinophils Absolute: 0 10*3/uL (ref 0.0–0.7)
Eosinophils Relative: 0 %
HEMATOCRIT: 26 % — AB (ref 36.0–46.0)
Hemoglobin: 8.4 g/dL — ABNORMAL LOW (ref 12.0–15.0)
LYMPHS ABS: 0.9 10*3/uL (ref 0.7–4.0)
Lymphocytes Relative: 7 %
MCH: 26.8 pg (ref 26.0–34.0)
MCHC: 32.3 g/dL (ref 30.0–36.0)
MCV: 82.8 fL (ref 78.0–100.0)
MONO ABS: 1.3 10*3/uL — AB (ref 0.1–1.0)
MONOS PCT: 10 %
Neutro Abs: 10.8 10*3/uL — ABNORMAL HIGH (ref 1.7–7.7)
Neutrophils Relative %: 82 %
PLATELETS: 529 10*3/uL — AB (ref 150–400)
RBC: 3.14 MIL/uL — AB (ref 3.87–5.11)
RDW: 17.8 % — AB (ref 11.5–15.5)
WBC: 13.1 10*3/uL — AB (ref 4.0–10.5)

## 2017-02-03 LAB — COMPREHENSIVE METABOLIC PANEL
ALBUMIN: 3 g/dL — AB (ref 3.5–5.0)
ALT: 12 U/L — ABNORMAL LOW (ref 14–54)
AST: 19 U/L (ref 15–41)
Alkaline Phosphatase: 72 U/L (ref 38–126)
Anion gap: 11 (ref 5–15)
BUN: 7 mg/dL (ref 6–20)
CHLORIDE: 92 mmol/L — AB (ref 101–111)
CO2: 29 mmol/L (ref 22–32)
Calcium: 8.5 mg/dL — ABNORMAL LOW (ref 8.9–10.3)
Creatinine, Ser: 0.82 mg/dL (ref 0.44–1.00)
GFR calc Af Amer: >60 mL/min (ref >60)
GFR calc non Af Amer: >60 mL/min (ref >60)
GLUCOSE: 116 mg/dL — AB (ref 65–99)
POTASSIUM: 2.8 mmol/L — AB (ref 3.5–5.1)
SODIUM: 132 mmol/L — AB (ref 135–145)
Total Bilirubin: 1 mg/dL (ref 0.3–1.2)
Total Protein: 6.1 g/dL — ABNORMAL LOW (ref 6.5–8.1)

## 2017-02-03 LAB — URIC ACID: Uric Acid, Serum: 5.2 mg/dL (ref 2.3–6.6)

## 2017-02-03 MED ORDER — SODIUM CHLORIDE 0.9% FLUSH: 3.0000 mL | INTRAVENOUS | Status: DC | PRN

## 2017-02-03 MED ORDER — ACYCLOVIR 400 MG PO TABS
400.0000 mg | ORAL_TABLET | Freq: Two times a day (BID) | ORAL | Status: DC
  Administered 2017-02-03 – 2017-02-07 (×9): 400 mg via ORAL
  Filled 2017-02-03 (×9): qty 1

## 2017-02-03 MED ORDER — TRAMADOL HCL 50 MG PO TABS: 100.0000 mg | ORAL_TABLET | Freq: Four times a day (QID) | ORAL | Status: DC | PRN

## 2017-02-03 MED ORDER — HEPARIN SOD (PORK) LOCK FLUSH 100 UNIT/ML IV SOLN: 250.0000 [IU] | Freq: Once | INTRAVENOUS | Status: DC | PRN

## 2017-02-03 MED ORDER — SODIUM CHLORIDE 0.9 % IV SOLN
Freq: Once | INTRAVENOUS | Status: AC
  Administered 2017-02-03: 8 mg via INTRAVENOUS
  Filled 2017-02-03: qty 4

## 2017-02-03 MED ORDER — SODIUM CHLORIDE 0.9 % IV SOLN
INTRAVENOUS | Status: DC
  Administered 2017-02-03: 20 mL via INTRAVENOUS
  Administered 2017-02-05: 15:00:00 via INTRAVENOUS

## 2017-02-03 MED ORDER — VINCRISTINE SULFATE CHEMO INJECTION 1 MG/ML
Freq: Once | INTRAVENOUS | Status: AC
  Administered 2017-02-03: 14:00:00 via INTRAVENOUS
  Filled 2017-02-03: qty 9

## 2017-02-03 MED ORDER — ACETAMINOPHEN 325 MG PO TABS: 650.0000 mg | ORAL_TABLET | ORAL | Status: DC | PRN

## 2017-02-03 MED ORDER — ALLOPURINOL 300 MG PO TABS
300.0000 mg | ORAL_TABLET | Freq: Every day | ORAL | Status: DC
  Administered 2017-02-03 – 2017-02-07 (×5): 300 mg via ORAL
  Filled 2017-02-03 (×5): qty 1

## 2017-02-03 MED ORDER — ONDANSETRON HCL 4 MG PO TABS: 4.0000 mg | ORAL_TABLET | Freq: Three times a day (TID) | ORAL | Status: DC | PRN

## 2017-02-03 MED ORDER — HEPARIN SOD (PORK) LOCK FLUSH 100 UNIT/ML IV SOLN: 500.0000 [IU] | Freq: Once | INTRAVENOUS | Status: DC | PRN

## 2017-02-03 MED ORDER — SODIUM CHLORIDE 0.9 % IV SOLN
8.0000 mg | Freq: Three times a day (TID) | INTRAVENOUS | Status: DC | PRN
  Filled 2017-02-03: qty 4

## 2017-02-03 MED ORDER — SENNOSIDES-DOCUSATE SODIUM 8.6-50 MG PO TABS: 1.0000 | ORAL_TABLET | Freq: Every evening | ORAL | Status: DC | PRN

## 2017-02-03 MED ORDER — LIDOCAINE-PRILOCAINE 2.5-2.5 % EX CREA: 1.0000 "application " | TOPICAL_CREAM | CUTANEOUS | Status: DC | PRN

## 2017-02-03 MED ORDER — COLD PACK MISC ONCOLOGY: 1.0000 | Freq: Once | Status: DC | PRN

## 2017-02-03 MED ORDER — HOT PACK MISC ONCOLOGY: 1.0000 | Freq: Once | Status: DC | PRN

## 2017-02-03 MED ORDER — ONDANSETRON 4 MG PO TBDP
4.0000 mg | ORAL_TABLET | Freq: Three times a day (TID) | ORAL | Status: DC | PRN
  Filled 2017-02-03: qty 2

## 2017-02-03 MED ORDER — POTASSIUM CHLORIDE CRYS ER 20 MEQ PO TBCR
20.0000 meq | EXTENDED_RELEASE_TABLET | Freq: Two times a day (BID) | ORAL | Status: DC
  Administered 2017-02-03 – 2017-02-04 (×3): 20 meq via ORAL
  Filled 2017-02-03 (×3): qty 1

## 2017-02-03 MED ORDER — LIP MEDEX EX OINT
TOPICAL_OINTMENT | CUTANEOUS | Status: AC
  Administered 2017-02-03: 14:00:00
  Filled 2017-02-03: qty 7

## 2017-02-03 MED ORDER — POTASSIUM CHLORIDE 10 MEQ/50ML IV SOLN
10.0000 meq | INTRAVENOUS | Status: AC
  Administered 2017-02-03 (×3): 10 meq via INTRAVENOUS
  Filled 2017-02-03 (×4): qty 50

## 2017-02-03 MED ORDER — ALTEPLASE 2 MG IJ SOLR
2.0000 mg | Freq: Once | INTRAMUSCULAR | Status: DC | PRN
  Filled 2017-02-03: qty 2

## 2017-02-03 MED ORDER — MORPHINE SULFATE (PF) 2 MG/ML IV SOLN
2.0000 mg | INTRAVENOUS | Status: DC | PRN
  Administered 2017-02-03: 2 mg via INTRAVENOUS
  Filled 2017-02-03: qty 1

## 2017-02-03 MED ORDER — OXYCODONE HCL 5 MG PO TABS
5.0000 mg | ORAL_TABLET | ORAL | Status: DC | PRN
  Administered 2017-02-04 – 2017-02-06 (×3): 5 mg via ORAL
  Filled 2017-02-03 (×3): qty 1

## 2017-02-03 MED ORDER — SODIUM CHLORIDE 0.9% FLUSH: 10.0000 mL | INTRAVENOUS | Status: DC | PRN

## 2017-02-03 MED ORDER — CYCLOBENZAPRINE HCL 10 MG PO TABS
10.0000 mg | ORAL_TABLET | Freq: Three times a day (TID) | ORAL | Status: DC | PRN
Start: 2017-02-03 — End: 2017-02-07
  Administered 2017-02-03 – 2017-02-06 (×3): 10 mg via ORAL
  Filled 2017-02-03 (×3): qty 1

## 2017-02-03 MED ORDER — ENOXAPARIN SODIUM 40 MG/0.4ML ~~LOC~~ SOLN
40.0000 mg | SUBCUTANEOUS | Status: DC
  Administered 2017-02-03 – 2017-02-04 (×2): 40 mg via SUBCUTANEOUS
  Filled 2017-02-03 (×3): qty 0.4

## 2017-02-03 MED ORDER — ONDANSETRON HCL 4 MG/2ML IJ SOLN
4.0000 mg | Freq: Three times a day (TID) | INTRAMUSCULAR | Status: DC | PRN
Start: 2017-02-03 — End: 2017-02-07

## 2017-02-03 NOTE — Telephone Encounter (Signed)
R/s appt per 12/17 sch message - left message for patient with appt date and time.

## 2017-02-03 NOTE — Evaluation (Signed)
Physical Therapy Evaluation Patient Details Name: Katrina [name] MRN: [id number] DOB: 1950-12-14 Today's Date: 02/03/2017   History of Present Illness  66 year old female   Clinical Impression  Pt is up to walk with assistance to take a short stroll toward BR but had to sit due to pt feeling unable to get there.  Has a WC for mobility and will need follow up with HHPT to progress her safe gait after this admission.  Pt is going to have ongoing chemo and expect her to need the therapy intervention until she is more stable with treatment.  Follow acutely as needed for strength and standing balance as needed.  Pt is unsafe to assist to walk to BR for now, PT will inform nursing as this improves.    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 and weakness Required Braces or Orthoses: Other Brace/Splint(L knee lateral support brace) Restrictions Weight Bearing Restrictions: No      Mobility  Bed Mobility Overal bed mobility: Needs Assistance Bed Mobility: Supine to Sit;Sit to Supine Rolling: Supervision   Supine to sit: Min guard;Min assist Sit to supine: Min guard;Min assist   General bed mobility comments: reminders for use of bedrail  Transfers Overall transfer level: Needs assistance Equipment used: Rolling walker (2 wheeled) Transfers: Sit to/from Stand Sit to Stand: Min guard;Min assist         General transfer comment: reminded hand placement and min help to power up  Ambulation/Gait Ambulation/Gait assistance: Min assist Ambulation Distance (Feet): 8 Feet(4 x 2) Assistive device: Rolling walker (2 wheeled);1 person hand held assist Gait Pattern/deviations: Step-to pattern;Step-through pattern;Decreased stride length;Wide base of  support(buckling appearance of knees) Gait velocity: decreased Gait velocity interpretation: Below normal speed for age/gender General Gait Details: pt sat in chair and then turned the chair for return to bed as she is unsafe for distances  Stairs            Wheelchair Mobility    Modified Rankin (Stroke Patients Only)       Balance Overall balance assessment: Needs assistance Sitting-balance support: Feet supported Sitting balance-Leahy Scale: Fair Sitting balance - Comments: supervised sitting balance   Standing balance support: Bilateral upper extremity supported;During functional activity Standing balance-Leahy Scale: Poor                               Pertinent Vitals/Pain Pain Assessment: Faces Faces Pain Scale: Hurts a little bit Pain Location: legs with sensory changes Pain Descriptors / Indicators: Tender Pain Intervention(s): Monitored during session;Repositioned    Home Living Family/patient expects to be discharged to:: Private residence Living Arrangements: Other relatives(sister) Available Help at Discharge: Family Type of Home: House Home Access: Stairs to enter Entrance Stairs-Rails: None Entrance Stairs-Number of Steps: 3 in the front and in the back - plan on building a ramp on the back Home Layout: One level Home Equipment: Walker - 2 wheels;Wheelchair - Sport and exercise psychologist Comments: staying with son and able to have 24/7 care    Prior Function Level of Independence: Needs assistance   Gait / Transfers Assistance Needed: walks with RW just to BR and using WC otherwise  ADL's / Homemaking Assistance Needed: family to assist  Hand Dominance   Dominant Hand: Right    Extremity/Trunk Assessment   Upper Extremity Assessment Upper Extremity Assessment: Overall WFL for tasks assessed    Lower Extremity Assessment Lower Extremity Assessment: Generalized weakness RLE Deficits / Details: hams and hip abd  weak RLE Sensation: decreased light touch;decreased proprioception LLE Deficits / Details: decreased hams and hip abd LLE Sensation: decreased light touch;decreased proprioception    Cervical / Trunk Assessment Cervical / Trunk Assessment: Normal Cervical / Trunk Exceptions: previous laminectomy for T6-8 tumor  Communication   Communication: No difficulties  Cognition Arousal/Alertness: Awake/alert Behavior During Therapy: WFL for tasks assessed/performed Overall Cognitive Status: Within Functional Limits for tasks assessed                                 General Comments: following conversation well and appropriate      General Comments General comments (skin integrity, edema, etc.): fragile skin on LE's and pt is noting both numbness and weakness on LE's    Exercises     Assessment/Plan    PT Assessment Patient needs continued PT services  PT Problem List Decreased strength;Decreased range of motion;Decreased activity tolerance;Decreased balance;Decreased mobility;Decreased coordination;Decreased knowledge of use of DME;Decreased safety awareness;Obesity;Decreased skin integrity;Impaired sensation       PT Treatment Interventions DME instruction;Gait training;Functional mobility training;Therapeutic activities;Therapeutic exercise;Balance training;Neuromuscular re-education;Patient/family education    PT Goals (Current goals can be found in the Care Plan section)  Acute Rehab PT Goals Patient Stated Goal: get home and continue to feel stronger PT Goal Formulation: With patient Time For Goal Achievement: 02/17/17 Potential to Achieve Goals: Good    Frequency Min 3X/week   Barriers to discharge (has family help and equipment at home)      Co-evaluation               AM-PAC PT "6 Clicks" Daily Activity  Outcome Measure Difficulty turning over in bed (including adjusting bedclothes, sheets and blankets)?: A Little Difficulty moving from lying on  back to sitting on the side of the bed? : Unable Difficulty sitting down on and standing up from a chair with arms (e.g., wheelchair, bedside commode, etc,.)?: Unable Help needed moving to and from a bed to chair (including a wheelchair)?: A Little Help needed walking in hospital room?: A Little Help needed climbing 3-5 steps with a railing? : Total 6 Click Score: 12    End of Session Equipment Utilized During Treatment: Gait belt Activity Tolerance: Patient tolerated treatment well;Other (comment)(LE weakness ) Patient left: in bed;with call bell/phone within reach;with bed alarm set Nurse Communication: Mobility status PT Visit Diagnosis: Unsteadiness on feet (R26.81);Muscle weakness (generalized) (M62.81);Ataxic gait (R26.0);Adult, failure to thrive (R62.7)    Time: 1015-1037 PT Time Calculation (min) (ACUTE ONLY): 22 min   Charges:   PT Evaluation $PT Eval Moderate Complexity: 1 Mod     PT G Codes:   PT G-Codes **NOT FOR INPATIENT CLASS** Functional Assessment Tool Used: AM-PAC 6 Clicks Basic Mobility    Ramond Dial 02/03/2017, 2:51 PM   Mee Hives, PT MS Acute Rehab Dept. Number: [place] [facility] [place]

## 2017-02-03 NOTE — Telephone Encounter (Signed)
Contacted PT and gave verbal orders per office protocol

## 2017-02-03 NOTE — Progress Notes (Signed)
Dr Alvy Bimler notified of K+ 2.8and  hgb 8.4. Orders to follow

## 2017-02-03 NOTE — H&P (Signed)
[place] ADMISSION NOTE  Patient Care Team: Patient, No Pcp Per as PCP - General (General Practice)  CHIEF COMPLAINTS/PURPOSE OF ADMISSION Cycle 2 of chemotherapy  HISTORY OF PRESENTING ILLNESS:  Katrina [name] 66 y.o. female is admitted for high dose, inpatient chemotherapy Summary of oncologic history as follows:   Diffuse large B cell lymphoma ([place])   12/12/2016 Imaging    CT scan showed  1. Extensive mesenteric, retroperitoneal, left hilar, mediastinal, left supraclavicular and left axillary adenopathy. Differential considerations include metastatic adenopathy, lymphoma and leukemia. The size of the nodes in the mesentery and retroperitoneum are suggestive of non-Hodgkin's lymphoma. 2. 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 Differential considerations include metastatic disease and lymphoma. A left upper lobe primary lung carcinoma is a possibility. 3. 3.2 cm solid right renal mass. Differential considerations include renal cell carcinoma and oncocytoma. 4. Small amount of free peritoneal fluid, most likely due to lymphatic obstruction by the mesenteric adenopathy. 5. Left fourth rib fracture. 6. Colonic diverticulosis. 7. Calcific coronary artery and aortic atherosclerosis. Aortic Atherosclerosis (ICD10-I70.0).       12/12/2016 - 12/14/2016 Hospital Admission    He was briefly admitted and evaluated for weakness.  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 Tumor fills the right neural foramina at T6-7 and T7-8 and completely replaces the T7 vertebral body.      12/18/2016 Surgery    She underwent  procedure: Decompressive thoracic laminectomy from T5-T8 for resection of epidural tumor        12/18/2016 Pathology Results    1. Soft tissue mass, simple excision, thoracic dorsal epidural mass - DIFFUSE LARGE B-CELL LYMPHOMA. - SEE ONCOLOGY TABLE. 2. Soft tissue mass, simple excision, thoracic dorsal epidural mass - DIFFUSE LARGE B-CELL LYMPHOMA. Microscopic Comment 1. LYMPHOMA Histologic type: Non-Hodgkin B-cell lymphoma: Diffuse large B-cell lymphoma. Grade (if applicable): High grade. Flow cytometry: N/A. Immunohistochemical stains: CD20. CD3, CD5, CD10, CD23, CD30, CD15, PAX-5, Ki-67, bcl-2, bcl-6, CD34. Touch preps/imprints: N/A. Comments: There is a diffuse infiltrate of atypical lymphocytes. The lymphocytes are medium to large in size with irregular nuclear contours. There are background smaller lymphocytes. While there are admixed smaller cells, there are diffuse areas of large cells. The large lymphocytes are positive for CD20, CD10, bcl-6, and bcl-2. CD3 and CD5 highlight admixed T-cells. CD34, CD30, CD23, and CD15 are negative. PAX-5 is positive. Ki-67 is variable with areas up to 70%. 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       12/18/2016 - 01/17/2017 Hospital Admission    The patient was readmitted to the hospital due to complete weakness secondary to spinal cord compression.  She underwent surgery and pathology report confirmed diagnosis of diffuse large B-cell lymphoma. She received IT chemo on 01/16/17      01/16/2017 Pathology Results    CEREBROSPINAL FLUID (SPECIMEN 1 OF 1 COLLECTED 01/16/17): NO MALIGNANT CELLS IDENTIFIED.      She complained of weakness.  Blood transfusion did not help much.  She has frequent diarrhea, improved  since last week.  She denies fever or chills  MEDICAL  HISTORY:  Past Medical History:  Diagnosis Date  . Anemia   . Arthritis   . 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 Hypertension   . Lymphadenopathy 11/2016   "extensive"  . Paresthesia of lower extremity 11/2016   bilateral  . Pulmonary nodules 11/2016  . Right renal mass 11/2016    SURGICAL HISTORY: Past Surgical History:  Procedure Laterality Date  . ABDOMINAL HYSTERECTOMY    . BREAST LUMPECTOMY     left  . COLONOSCOPY N/A 11/02/2013   Procedure: COLONOSCOPY;  Surgeon: Danie Binder, MD;  [facility];  Service: Endoscopy;  Laterality: N/A;  1:15 - moved to [place] notified pt  . IR FLUORO GUIDE PORT INSERTION RIGHT  12/30/2016  . IR US GUIDE VASC ACCESS RIGHT  12/30/2016  . LAMINECTOMY N/A 12/18/2016   Procedure: DECOMPRESSION T5-8/THORACIC;  Surgeon: Kary Kos, MD;  Location: [place];  Service: Neurosurgery;  Laterality: N/A;  Decompressive Thoracic Laminectomy, Thoracic five - six, Thoraic six - seven, Thoracic seven - eight  . SHOULDER SURGERY     right    SOCIAL HISTORY: Social History   Socioeconomic History  . Marital status: Widowed    Spouse name: Not on file  . Number of children: Not on file  . Years of education: Not on file  . Highest education level: Not on file  Social Needs  . Financial resource strain: Not on file  . Food insecurity - worry: Not on file  . Food insecurity - inability: Not on file  . Transportation needs - medical: Not on file  . Transportation needs - non-medical: Not on file  Occupational History  . Not on file  Tobacco Use  . Smoking status: Never Smoker  . Smokeless tobacco: Never Used  Substance and Sexual Activity  . Alcohol use: No  . Drug use: No  . Sexual activity: No  Other Topics Concern  . Not on file  Social History Narrative  . Not on file    FAMILY HISTORY: Family History  Problem Relation Age of Onset  . Colon cancer Sister        at age 15  . Colon cancer Brother        at age 18  . COPD Mother    . Diabetes Mellitus II Mother   . CAD Mother        Angina  . Colon cancer Father   . Thyroid disease Sister   . Diabetes Mellitus II Sister     ALLERGIES:  is allergic to contrast media [iodinated diagnostic agents].  MEDICATIONS:  Current Facility-Administered Medications  Medication Dose Route Frequency Provider Last Rate Last Dose  . 0.9 %  sodium chloride infusion   Intravenous Continuous Alvy Bimler, Sanders Manninen, MD 20 mL/hr at 02/03/17 1244 20 mL at 02/03/17 1244  . acetaminophen (TYLENOL) tablet 650 mg  650 mg Oral Q4H PRN Alvy Bimler, Siddiq Kaluzny, MD      . acyclovir (ZOVIRAX) tablet 400 mg  400 mg Oral BID Alvy Bimler, Diarra Ceja, MD   400 mg at 02/03/17 1242  . allopurinol (ZYLOPRIM) tablet 300 mg  300 mg Oral Daily Alvy Bimler, Bev Drennen, MD   300 mg at 02/03/17 1242  . alteplase (CATHFLO ACTIVASE) injection 2 mg  2 mg Intracatheter Once PRN Heath Lark, MD      . Cold Pack 1 packet  1 packet Topical Once PRN Heath Lark, MD      .  cyclobenzaprine (FLEXERIL) tablet 10 mg  10 mg Oral TID PRN Alvy Bimler, Hilario Robarts, MD      . DOXOrubicin (ADRIAMYCIN) 18 mg, etoposide (VEPESID) 94 mg, vinCRIStine (ONCOVIN) 0.9 mg in sodium chloride 0.9 % 500 mL chemo infusion   Intravenous Once Ludell Zacarias, MD      . enoxaparin (LOVENOX) injection 40 mg  40 mg Subcutaneous Q24H , Rupa Lagan, MD   40 mg at 02/03/17 1243  . heparin lock flush 100 unit/mL  500 Units Intracatheter Once PRN Alvy Bimler, Aerin Delany, MD      . heparin lock flush 100 unit/mL  250 Units Intracatheter Once PRN Heath Lark, MD      . Hot Pack 1 packet  1 packet Topical Once PRN Alvy Bimler, Quita Mcgrory, MD      . lidocaine-prilocaine (EMLA) cream 1 application  1 application Topical PRN Alvy Bimler, Shaylen Nephew, MD      . lip balm (CARMEX) ointment           . morphine 2 MG/ML injection 2 mg  2 mg Intravenous Q2H PRN Alvy Bimler, Kairo Laubacher, MD      . ondansetron (ZOFRAN) tablet 4-8 mg  4-8 mg Oral Q8H PRN Alvy Bimler, Estera Ozier, MD       Or  . ondansetron (ZOFRAN-ODT) disintegrating tablet 4-8 mg  4-8 mg Oral Q8H PRN Alvy Bimler, Cayman Kielbasa, MD        Or  . ondansetron (ZOFRAN) injection 4 mg  4 mg Intravenous Q8H PRN Lashan Gluth, MD       Or  . ondansetron (ZOFRAN) 8 mg in sodium chloride 0.9 % 50 mL IVPB  8 mg Intravenous Q8H PRN Maxton Noreen, MD      . oxyCODONE (Oxy IR/ROXICODONE) immediate release tablet 5 mg  5 mg Oral Q4H PRN Ulyssa Walthour, MD      . potassium chloride 10 mEq in 50 mL *CENTRAL LINE* IVPB  10 mEq Intravenous Q1 Hr x 4 Hillis Mcphatter, MD      . potassium chloride SA (K-DUR,KLOR-CON) CR tablet 20 mEq  20 mEq Oral BID Alvy Bimler, Alec Jaros, MD   20 mEq at 02/03/17 1300  . senna-docusate (Senokot-S) tablet 1 tablet  1 tablet Oral QHS PRN Alvy Bimler, Copeland Neisen, MD      . sodium chloride flush (NS) 0.9 % injection 10 mL  10 mL Intracatheter PRN , Calistro Rauf, MD      . sodium chloride flush (NS) 0.9 % injection 3 mL  3 mL Intravenous PRN Alvy Bimler, , MD      . traMADol (ULTRAM) tablet 100 mg  100 mg Oral Q6H PRN Alvy Bimler, , MD        REVIEW OF SYSTEMS:   Constitutional: Denies fevers, chills or abnormal night sweats Eyes: Denies blurriness of vision, double vision or watery eyes Ears, nose, mouth, throat, and face: Denies mucositis or sore throat Respiratory: Denies cough, dyspnea or wheezes Cardiovascular: Denies palpitation, chest discomfort or lower extremity swelling Gastrointestinal:  Denies nausea, heartburn or change in bowel habits Skin: Denies abnormal skin rashes Lymphatics: Denies new lymphadenopathy or easy bruising Neurological:Denies numbness, tingling or new weaknesses Behavioral/Psych: Mood is stable, no new changes  All other systems were reviewed with the patient and are negative.  PHYSICAL EXAMINATION: ECOG PERFORMANCE STATUS: 2 - Symptomatic, <50% confined to bed  Vitals:   02/03/17 0857  BP: (!) 169/81  Pulse: 90  Resp: 18  Temp: 98.1 F (36.7 C)  SpO2: 93%   Filed Weights   02/03/17 0857  Weight: 225 lb 8.5 oz (102.3 kg)  GENERAL:alert, no distress and comfortable SKIN: skin color, texture, turgor are  normal, no rashes or significant lesions EYES: normal, conjunctiva are pink and non-injected, sclera clear OROPHARYNX:no exudate, no erythema and lips, buccal mucosa, and tongue normal  NECK: supple, thyroid normal size, non-tender, without nodularity LYMPH:  no palpable lymphadenopathy in the cervical, axillary or inguinal LUNGS: clear to auscultation and percussion with normal breathing effort HEART: regular rate & rhythm and no murmurs and no lower extremity edema ABDOMEN:abdomen soft, non-tender and normal bowel sounds Musculoskeletal:no cyanosis of digits and no clubbing  PSYCH: alert & oriented x 3 with fluent speech NEURO: no focal motor/sensory deficits  LABORATORY DATA:  I have reviewed the data as listed Lab Results  Component Value Date   WBC 13.1 (H) 02/03/2017   HGB 8.4 (L) 02/03/2017   HCT 26.0 (L) 02/03/2017   MCV 82.8 02/03/2017   PLT 529 (H) 02/03/2017   Recent Labs    01/14/17 0715 01/15/17 0615 01/16/17 0530 01/31/17 1102 02/03/17 0950  NA 132* 136 136 132* 132*  K 3.2* 3.3* 3.5 3.3* 2.8*  CL 97* 105 105  --  92*  CO2 27 27 26 26 29   GLUCOSE 142* 110* 123* 119 116*  BUN 20 18 15  7.7 7  CREATININE 0.73 0.64 0.56 0.8 0.82  CALCIUM 8.6* 8.2* 8.2* 8.9 8.5*  GFRNONAA >60 >60 >60  --  >60  GFRAA >60 >60 >60  --  >60  PROT 6.1*  --   --  6.7 6.1*  ALBUMIN 3.3*  --   --  3.0* 3.0*  AST 20  --   --  16 19  ALT 20  --   --  10 12*  ALKPHOS 62  --   --  85 72  BILITOT 0.6  --   --  0.63 1.0    RADIOGRAPHIC STUDIES: I have personally reviewed the radiological images as listed and agreed with the findings in the report. Dg Fluoro Guided Needle Plc Aspiration/injection Loc  Result Date: 01/16/2017 CLINICAL DATA:  B-cell lymphoma EXAM: FLUOROSCOPICALLY GUIDED LUMBAR PUNCTURE FOR INTRATHECAL CHEMOTHERAPY TECHNIQUE: Informed consent was obtained from the patient prior to the procedure, including potential complications of headache, allergy, and pain. A 'time out'  was performed. With the patient prone, the lower back was prepped with Betadine. 1% Lidocaine was used for local anesthesia. Lumbar puncture was performed at the L3-4 using a 20 gauge needle with return of clear CSF. 8 cc clear CSF S collected for testing. 12 mg Of methotrexate was injected into the subarachnoid space. The patient tolerated the procedure well without apparent complication. FLUOROSCOPY TIME:  13 seconds IMPRESSION: Intrathecal injection of chemotherapy without complication Electronically Signed   By: Rolm Baptise M.D.   On: 01/16/2017 10:11    ASSESSMENT & PLAN:   Diffuse large B cell lymphoma ([place]) She has side effects from treatment including constipation and then recently diarrhea, fatigue and severe anemia Overall, her symptoms are tolerable She is admitted today cycle 2 of R-EPOCH chemotherapy with dose adjustment due to her side effects She will receive rituximab on day 3 and IT chemotherapy on day 4 She will return on February 10, 2017 for G-CSF support  I plan to repeat imaging study after 2 cycles of chemotherapy The risks, benefits, side effects of chemotherapy are discussed with the patient and she agreed to proceed  Anemia due to antineoplastic chemotherapy She has received blood transfusion on January 31, 2017.  Plan to recheck iron studies  tomorrow   Diarrhea The cause is unknown but could be due to recent chemotherapy I will reduce the dose of doxorubicin, etoposide and Cytoxan We will hold off Imodium because she generally gets constipated with each cycle of treatment  Dehydration and hypokalemia Secondary to diarrhea I recommend IV fluid support along with potassium replacement therapy We will check serum magnesium tomorrow  CODE STATUS Full code  DVT prophylaxis Lovenox.  We will hold on February 05, 2017 in anticipation for IT chemo around February 06, 2017  Discharge planning At the end of the week  All questions were answered. The patient  knows to call the clinic with any problems, questions or concerns.    Heath Lark, MD 02/03/2017 1:43 PM

## 2017-02-03 NOTE — Progress Notes (Signed)
Doses and dilutions for Doxorubicin, Etoposide and oncovin verified with Vickie Herbst, RN.

## 2017-02-03 NOTE — Progress Notes (Signed)
Pt is from home and active with [facility] for HHPT. Will need resumption orders from MD at discharge. [facility] rep following along while pt in hospital. Marney Doctor RN,BSN,NCM [phone]

## 2017-02-04 LAB — BASIC METABOLIC PANEL
Anion gap: 9 (ref 5–15)
BUN: 11 mg/dL (ref 6–20)
CHLORIDE: 99 mmol/L — AB (ref 101–111)
CO2: 27 mmol/L (ref 22–32)
Calcium: 8.8 mg/dL — ABNORMAL LOW (ref 8.9–10.3)
Creatinine, Ser: 0.65 mg/dL (ref 0.44–1.00)
GFR calc non Af Amer: >60 mL/min (ref >60)
Glucose, Bld: 134 mg/dL — ABNORMAL HIGH (ref 65–99)
POTASSIUM: 3.8 mmol/L (ref 3.5–5.1)
SODIUM: 135 mmol/L (ref 135–145)

## 2017-02-04 LAB — TYPE AND SCREEN
ABO/RH(D): O NEG
Antibody Screen: NEGATIVE
Unit division: 0
Unit division: 0

## 2017-02-04 LAB — MAGNESIUM: MAGNESIUM: 2 mg/dL (ref 1.7–2.4)

## 2017-02-04 LAB — CBC WITH DIFFERENTIAL/PLATELET
Basophils Absolute: 0 10*3/uL (ref 0.0–0.1)
Basophils Relative: 0 %
EOS ABS: 0 10*3/uL (ref 0.0–0.7)
EOS PCT: 0 %
HCT: 25 % — ABNORMAL LOW (ref 36.0–46.0)
Hemoglobin: 8.2 g/dL — ABNORMAL LOW (ref 12.0–15.0)
LYMPHS ABS: 0.9 10*3/uL (ref 0.7–4.0)
Lymphocytes Relative: 7 %
MCH: 27.5 pg (ref 26.0–34.0)
MCHC: 32.8 g/dL (ref 30.0–36.0)
MCV: 83.9 fL (ref 78.0–100.0)
MONO ABS: 0.6 10*3/uL (ref 0.1–1.0)
Monocytes Relative: 4 %
Neutro Abs: 11.4 10*3/uL — ABNORMAL HIGH (ref 1.7–7.7)
Neutrophils Relative %: 89 %
PLATELETS: 509 10*3/uL — AB (ref 150–400)
RBC: 2.98 MIL/uL — AB (ref 3.87–5.11)
RDW: 17.8 % — AB (ref 11.5–15.5)
WBC: 12.8 10*3/uL — AB (ref 4.0–10.5)

## 2017-02-04 LAB — BPAM RBC
BLOOD PRODUCT EXPIRATION DATE: 201812252359
BLOOD PRODUCT EXPIRATION DATE: 201901022359
ISSUE DATE / TIME: 201812141436
UNIT TYPE AND RH: 9500
Unit Type and Rh: 9500

## 2017-02-04 LAB — FERRITIN: FERRITIN: 778 ng/mL — AB (ref 11–307)

## 2017-02-04 LAB — IRON AND TIBC
Iron: 59 ug/dL (ref 28–170)
Saturation Ratios: 24 % (ref 10.4–31.8)
TIBC: 248 ug/dL — ABNORMAL LOW (ref 250–450)
UIBC: 189 ug/dL

## 2017-02-04 MED ORDER — SENNOSIDES-DOCUSATE SODIUM 8.6-50 MG PO TABS
2.0000 | ORAL_TABLET | Freq: Every day | ORAL | Status: DC
  Administered 2017-02-04 – 2017-02-06 (×3): 2 via ORAL
  Filled 2017-02-04 (×3): qty 2

## 2017-02-04 MED ORDER — VINCRISTINE SULFATE CHEMO INJECTION 1 MG/ML
Freq: Once | INTRAVENOUS | Status: AC
  Administered 2017-02-04: 14:00:00 via INTRAVENOUS
  Filled 2017-02-04 (×2): qty 9

## 2017-02-04 MED ORDER — ONDANSETRON HCL 40 MG/20ML IJ SOLN
Freq: Once | INTRAMUSCULAR | Status: AC
  Administered 2017-02-04: 8 mg via INTRAVENOUS
  Filled 2017-02-04: qty 4

## 2017-02-04 NOTE — Progress Notes (Signed)
Katrina [name]   DOB:08/19/1950   EX#:[id number]    Subjective: She tolerated chemo well.  She gets stronger since admission.  Diarrhea.  She is mobilizing with PT.  She denies cough, chest pain or shortness of breath  Objective:  Vitals:   02/03/17 2042 02/04/17 0445  BP: 129/77 138/74  Pulse: 87 89  Resp: 18 18  Temp: 98.5 F (36.9 C) 97.8 F (36.6 C)  SpO2: 98% 97%     Intake/Output Summary (Last 24 hours) at 02/04/2017 0102 Last data filed at 02/03/2017 2138 Gross per 24 hour  Intake 360 ml  Output 451 ml  Net -91 ml    GENERAL:alert, no distress and comfortable SKIN: skin color, texture, turgor are normal, no rashes or significant lesions EYES: normal, Conjunctiva are pink and non-injected, sclera clear OROPHARYNX:no exudate, no erythema and lips, buccal mucosa, and tongue normal  NECK: supple, thyroid normal size, non-tender, without nodularity LYMPH:  no palpable lymphadenopathy in the cervical, axillary or inguinal LUNGS: clear to auscultation and percussion with normal breathing effort HEART: regular rate & rhythm and no murmurs and no lower extremity edema ABDOMEN:abdomen soft, non-tender and normal bowel sounds Musculoskeletal:no cyanosis of digits and no clubbing  NEURO: alert & oriented x 3 with fluent speech, no focal motor/sensory deficits   Labs:  Lab Results  Component Value Date   WBC 12.8 (H) 02/04/2017   HGB 8.2 (L) 02/04/2017   HCT 25.0 (L) 02/04/2017   MCV 83.9 02/04/2017   PLT 509 (H) 02/04/2017   NEUTROABS 11.4 (H) 02/04/2017    Lab Results  Component Value Date   NA 132 (L) 02/03/2017   K 2.8 (L) 02/03/2017   CL 92 (L) 02/03/2017   CO2 29 02/03/2017    Assessment & Plan:   Diffuse large B cell lymphoma ([place]) She has side effects from treatment including constipation and then recently diarrhea, fatigue and severe anemia Overall, her symptoms aretolerable She has received cycle 2 ofR-EPOCHchemotherapy since 02/03/17 with dose  adjustment due to her side effects She will receive rituximab on day 3 and IT chemotherapy on day 4 She will return on February 10, 2017 for G-CSF support I plan to repeat imaging study after 2 cycles of chemotherapy  Anemia due to antineoplastic chemotherapy She has received blood transfusion on January 31, 2017.  Iron studies are consistent with anemia chronic illness She does not need blood transfusion I would recheck hemoglobin in 2 days She would receive a unit of blood if hemoglobin is less than 8  Diarrhea, resolved The cause is unknown but could be due to recent chemotherapy I will reduce the dose of doxorubicin, etoposide and Cytoxan We will hold off Imodium because she generally gets constipated with each cycle of treatment Restart Senokot due to high risk of constipation  Dehydration and hypokalemia, resolved  CODE STATUS Full code  DVT prophylaxis Lovenox.  We will hold on February 05, 2017 in anticipation for IT chemo around February 06, 2017  Discharge planning At the end of the week  Heath Lark, MD 02/04/2017  7:27 AM

## 2017-02-04 NOTE — Progress Notes (Signed)
Doses and dilutions for Doxorubicin, Vepesid and Oncovin verified with Aloha Gell, RN.

## 2017-02-05 ENCOUNTER — Other Ambulatory Visit: Payer: Self-pay | Admitting: Hematology and Oncology

## 2017-02-05 DIAGNOSIS — R0981 Nasal congestion: Secondary | ICD-10-CM

## 2017-02-05 MED ORDER — ACETAMINOPHEN 325 MG PO TABS
650.0000 mg | ORAL_TABLET | Freq: Once | ORAL | Status: AC
  Administered 2017-02-05: 650 mg via ORAL
  Filled 2017-02-05: qty 2

## 2017-02-05 MED ORDER — VINCRISTINE SULFATE CHEMO INJECTION 1 MG/ML
Freq: Once | INTRAVENOUS | Status: AC
  Administered 2017-02-05: 20:00:00 via INTRAVENOUS
  Filled 2017-02-05: qty 9

## 2017-02-05 MED ORDER — TRIAMCINOLONE ACETONIDE 55 MCG/ACT NA AERO
1.0000 | INHALATION_SPRAY | Freq: Two times a day (BID) | NASAL | Status: DC
  Administered 2017-02-05 – 2017-02-07 (×5): 1 via NASAL
  Filled 2017-02-05: qty 10.8

## 2017-02-05 MED ORDER — SODIUM CHLORIDE 0.9 % IV SOLN: Freq: Once | INTRAVENOUS | Status: DC

## 2017-02-05 MED ORDER — ALTEPLASE 2 MG IJ SOLR
2.0000 mg | Freq: Once | INTRAMUSCULAR | Status: DC | PRN
  Filled 2017-02-05: qty 2

## 2017-02-05 MED ORDER — SODIUM CHLORIDE 0.9% FLUSH: 3.0000 mL | INTRAVENOUS | Status: DC | PRN

## 2017-02-05 MED ORDER — EPINEPHRINE PF 1 MG/ML IJ SOLN
0.5000 mg | Freq: Once | INTRAMUSCULAR | Status: DC | PRN
  Filled 2017-02-05: qty 1

## 2017-02-05 MED ORDER — ONDANSETRON HCL 40 MG/20ML IJ SOLN
Freq: Once | INTRAMUSCULAR | Status: AC
  Administered 2017-02-05: 8 mg via INTRAVENOUS
  Filled 2017-02-05: qty 4

## 2017-02-05 MED ORDER — ALBUTEROL SULFATE (2.5 MG/3ML) 0.083% IN NEBU: 2.5000 mg | INHALATION_SOLUTION | Freq: Once | RESPIRATORY_TRACT | Status: DC | PRN

## 2017-02-05 MED ORDER — EPINEPHRINE PF 1 MG/10ML IJ SOSY: 0.2500 mg | PREFILLED_SYRINGE | Freq: Once | INTRAMUSCULAR | Status: DC | PRN

## 2017-02-05 MED ORDER — DIPHENHYDRAMINE HCL 50 MG/ML IJ SOLN: 25.0000 mg | Freq: Once | INTRAMUSCULAR | Status: DC | PRN

## 2017-02-05 MED ORDER — SODIUM CHLORIDE 0.9 % IV SOLN
375.0000 mg/m2 | Freq: Once | INTRAVENOUS | Status: AC
  Administered 2017-02-05: 800 mg via INTRAVENOUS
  Filled 2017-02-05: qty 50

## 2017-02-05 MED ORDER — SODIUM CHLORIDE 0.9% FLUSH: 10.0000 mL | INTRAVENOUS | Status: DC | PRN

## 2017-02-05 MED ORDER — METHYLPREDNISOLONE SODIUM SUCC 125 MG IJ SOLR: 125.0000 mg | Freq: Once | INTRAMUSCULAR | Status: DC | PRN

## 2017-02-05 MED ORDER — FAMOTIDINE IN NACL 20-0.9 MG/50ML-% IV SOLN
20.0000 mg | Freq: Once | INTRAVENOUS | Status: DC | PRN
  Filled 2017-02-05: qty 50

## 2017-02-05 MED ORDER — DIPHENHYDRAMINE HCL 50 MG PO CAPS
50.0000 mg | ORAL_CAPSULE | Freq: Once | ORAL | Status: AC
  Administered 2017-02-05: 50 mg via ORAL
  Filled 2017-02-05: qty 1

## 2017-02-05 MED ORDER — DIPHENHYDRAMINE HCL 50 MG/ML IJ SOLN: 50.0000 mg | Freq: Once | INTRAMUSCULAR | Status: DC | PRN

## 2017-02-05 MED ORDER — GUAIFENESIN ER 600 MG PO TB12
600.0000 mg | ORAL_TABLET | Freq: Two times a day (BID) | ORAL | Status: DC
  Administered 2017-02-05 – 2017-02-07 (×5): 600 mg via ORAL
  Filled 2017-02-05 (×5): qty 1

## 2017-02-05 MED ORDER — HEPARIN SOD (PORK) LOCK FLUSH 100 UNIT/ML IV SOLN
250.0000 [IU] | Freq: Once | INTRAVENOUS | Status: DC | PRN
  Filled 2017-02-05: qty 5

## 2017-02-05 MED ORDER — SODIUM CHLORIDE 0.9 % IV SOLN: Freq: Once | INTRAVENOUS | Status: DC | PRN

## 2017-02-05 MED ORDER — HEPARIN SOD (PORK) LOCK FLUSH 100 UNIT/ML IV SOLN
500.0000 [IU] | Freq: Once | INTRAVENOUS | Status: AC | PRN
  Administered 2017-02-07: 500 [IU]

## 2017-02-05 NOTE — Progress Notes (Signed)
Katrina [name]   DOB:Dec 14, 1950   ER#:[id number]    Subjective: She tolerated treatment well.  She denies constipation.  No nausea.  Overall, she feels well without side effects from treatment.  She complained of some nasal congestion without fever  Objective:  Vitals:   02/04/17 2107 02/05/17 0450  BP: (!) 144/67 128/65  Pulse: 87 75  Resp: 16 18  Temp: 97.8 F (36.6 C) 98 F (36.7 C)  SpO2: 98% 99%     Intake/Output Summary (Last 24 hours) at 02/05/2017 0946 Last data filed at 02/05/2017 0525 Gross per 24 hour  Intake 1200 ml  Output -  Net 1200 ml    GENERAL:alert, no distress and comfortable SKIN: skin color, texture, turgor are normal, no rashes or significant lesions EYES: normal, Conjunctiva are pink and non-injected, sclera clear OROPHARYNX:no exudate, no erythema and lips, buccal mucosa, and tongue normal  NECK: supple, thyroid normal size, non-tender, without nodularity LYMPH:  no palpable lymphadenopathy in the cervical, axillary or inguinal LUNGS: clear to auscultation and percussion with normal breathing effort HEART: regular rate & rhythm and no murmurs and no lower extremity edema ABDOMEN:abdomen soft, non-tender and normal bowel sounds Musculoskeletal:no cyanosis of digits and no clubbing  NEURO: alert & oriented x 3 with fluent speech, no focal motor/sensory deficits   Labs:  Lab Results  Component Value Date   WBC 12.8 (H) 02/04/2017   HGB 8.2 (L) 02/04/2017   HCT 25.0 (L) 02/04/2017   MCV 83.9 02/04/2017   PLT 509 (H) 02/04/2017   NEUTROABS 11.4 (H) 02/04/2017    Lab Results  Component Value Date   NA 135 02/04/2017   K 3.8 02/04/2017   CL 99 (L) 02/04/2017   CO2 27 02/04/2017    Assessment & Plan:   Diffuse large B cell lymphoma ([place]) She has side effects from treatment including constipation and then recently diarrhea, fatigue and severe anemia Overall, her symptoms aretolerable She has received cycle 2 ofR-EPOCHchemotherapy since  02/03/17 with dose adjustment due to her side effects She will receive rituximab on day 3 and IT chemotherapy on day 4 She will return on February 10, 2017 for G-CSF support I plan to repeat imaging study after 2 cycles of chemotherapy  Anemia due to antineoplastic chemotherapy She has received blood transfusion on January 31, 2017.  Iron studies are consistent with anemia chronic illness She does not need blood transfusion I would recheck hemoglobin tomorrow She would receive a unit of blood if hemoglobin is less than 8   Nasal congestion She has postnasal drip Recommend conservative management with Mucinex and Nasacort  Diarrhea, resolved The cause is unknown but could be due to recent chemotherapy I will reduce the dose of doxorubicin, etoposide and Cytoxan Restart Senokot due to high risk of constipation  Dehydrationand hypokalemia, resolved  CODE STATUS Full code  DVT prophylaxis Lovenox. We will hold on February 05, 2017 in anticipation for IT chemo around February 06, 2017  Discharge planning At the end of the week    Heath Lark, MD 02/05/2017  9:46 AM

## 2017-02-05 NOTE — Progress Notes (Signed)
Dosages and dilutions for Doxorubicin, Etoposide, Oncovin and Rituxan verified with Reyne Dumas, RN.

## 2017-02-06 ENCOUNTER — Inpatient Hospital Stay (HOSPITAL_COMMUNITY): Payer: Medicare Other

## 2017-02-06 LAB — BASIC METABOLIC PANEL
ANION GAP: 7 (ref 5–15)
BUN: 14 mg/dL (ref 6–20)
CHLORIDE: 103 mmol/L (ref 101–111)
CO2: 26 mmol/L (ref 22–32)
CREATININE: 0.56 mg/dL (ref 0.44–1.00)
Calcium: 8.5 mg/dL — ABNORMAL LOW (ref 8.9–10.3)
GFR calc non Af Amer: >60 mL/min (ref >60)
Glucose, Bld: 132 mg/dL — ABNORMAL HIGH (ref 65–99)
POTASSIUM: 3.7 mmol/L (ref 3.5–5.1)
Sodium: 136 mmol/L (ref 135–145)

## 2017-02-06 LAB — CBC WITH DIFFERENTIAL/PLATELET
BASOS PCT: 0 %
Basophils Absolute: 0 10*3/uL (ref 0.0–0.1)
Eosinophils Absolute: 0 10*3/uL (ref 0.0–0.7)
Eosinophils Relative: 0 %
HEMATOCRIT: 24.5 % — AB (ref 36.0–46.0)
HEMOGLOBIN: 8.1 g/dL — AB (ref 12.0–15.0)
Lymphocytes Relative: 5 %
Lymphs Abs: 0.3 10*3/uL — ABNORMAL LOW (ref 0.7–4.0)
MCH: 27.9 pg (ref 26.0–34.0)
MCHC: 33.1 g/dL (ref 30.0–36.0)
MCV: 84.5 fL (ref 78.0–100.0)
MONOS PCT: 3 %
Monocytes Absolute: 0.2 10*3/uL (ref 0.1–1.0)
NEUTROS ABS: 5.5 10*3/uL (ref 1.7–7.7)
NEUTROS PCT: 92 %
Platelets: 464 10*3/uL — ABNORMAL HIGH (ref 150–400)
RBC: 2.9 MIL/uL — AB (ref 3.87–5.11)
RDW: 17.9 % — ABNORMAL HIGH (ref 11.5–15.5)
WBC: 5.9 10*3/uL (ref 4.0–10.5)

## 2017-02-06 MED ORDER — SODIUM CHLORIDE 0.9 % IV SOLN
Freq: Once | INTRAVENOUS | Status: AC
  Administered 2017-02-06: 8 mg via INTRAVENOUS
  Filled 2017-02-06: qty 4

## 2017-02-06 MED ORDER — SODIUM CHLORIDE 0.9 % IJ SOLN
Freq: Once | INTRAMUSCULAR | Status: AC
  Administered 2017-02-06: 14:00:00 via INTRATHECAL
  Filled 2017-02-06: qty 0.48

## 2017-02-06 MED ORDER — VINCRISTINE SULFATE CHEMO INJECTION 1 MG/ML
Freq: Once | INTRAVENOUS | Status: AC
  Administered 2017-02-06: 19:00:00 via INTRAVENOUS
  Filled 2017-02-06: qty 9

## 2017-02-06 MED ORDER — SODIUM CHLORIDE 0.9% FLUSH: 10.0000 mL | INTRAVENOUS | Status: DC | PRN

## 2017-02-06 MED ORDER — LIDOCAINE HCL 1 % IJ SOLN
INTRAMUSCULAR | Status: AC
  Administered 2017-02-06: 3 mL
  Filled 2017-02-06: qty 20

## 2017-02-06 NOTE — Care Management Important Message (Signed)
Important Message  Patient Details  Name: Katrina [name] MRN: [id number] Date of Birth: January 06, 1951   Medicare Important Message Given:  Yes    Kerin Salen 02/06/2017, 10:16 AMImportant Message  Patient Details  Name: Katrina [name] MRN: [id number] Date of Birth: December 26, 1950   Medicare Important Message Given:  Yes    Kerin Salen 02/06/2017, 10:16 AM

## 2017-02-06 NOTE — Procedures (Signed)
CLINICAL DATA: Large B-cell lymphoma   EXAM: LUMBAR PUNCTURE UNDER FLUOROSCOPIC GUIDANCE, INTRATHECAL METHOTREXATE ADMINISTRATION   FLUOROSCOPY TIME: Fluoroscopy Time:  0 MINUTES, 32 SECONDS  Radiation Exposure Index (if provided by the fluoroscopic device):  8.4 MGY  Number of Acquired Spot Images: 0  MEDICATIONS:   12 MG METHOTREXATE ADMINISTERED INTRATHECALLY   PROCEDURE:  I discussed the risks (including hemorrhage, infection, headache, and nerve damage, among others), benefits, and alternatives to fluoroscopically guided lumbar puncture and intrathecal administration of methotrexate with the patient.  We specifically discussed the high technical likelihood of success of the procedure. The patient understood and elected to undergo the procedure.    Standard time-out was employed.  Following sterile skin prep and local anesthetic administration consisting of 1 percent lidocaine, a 22 gauge spinal needle was advanced without difficulty into the thecal sac at the at the L3-4 level.  Clear CSF was returned.  Opening pressure was not requested or performed.    12 mg of methotrexate in 5 mL was slowly administered intrathecally.  The needle was subsequently removed and the skin cleansed and bandaged.  No immediate complications were observed.    IMPRESSION:  Successful intrathecal administration of methotrexate.

## 2017-02-06 NOTE — Progress Notes (Signed)
PT Cancellation Note  Patient Details Name: JELISA [place] MRN: [id number] DOB: [birth year]   Cancelled Treatment:     PT attempted this pm but deferred - RN advises pt on bedrest until 6pm following procedure.  Will follow.   Issam Carlyon 02/06/2017, 2:08 PM

## 2017-02-06 NOTE — Progress Notes (Signed)
Had her intrathecal methotrexate today, tolerated well, bedrest observed.

## 2017-02-06 NOTE — Progress Notes (Signed)
Dosages and calculations for doxorubicin, etoposide , oncovin,  verified with Aldean Baker RN

## 2017-02-06 NOTE — Progress Notes (Signed)
Katrina [name]   DOB:11-12-1950   XT#:[id number]    Subjective: She tolerated treatment well.  Denies constipation.  She denies fatigue, dizziness or short of breath.  She would like to mobilize more  Objective:  Vitals:   02/05/17 2038 02/06/17 0459  BP: (!) 152/77 (!) 144/71  Pulse: 87 87  Resp: 16 16  Temp: 98.6 F (37 C) 98 F (36.7 C)  SpO2: 97% 97%     Intake/Output Summary (Last 24 hours) at 02/06/2017 0843 Last data filed at 02/06/2017 0800 Gross per 24 hour  Intake 1049.43 ml  Output 300 ml  Net 749.43 ml    GENERAL:alert, no distress and comfortable SKIN: skin color, texture, turgor are normal, no rashes or significant lesions EYES: normal, Conjunctiva are pink and non-injected, sclera clear OROPHARYNX:no exudate, no erythema and lips, buccal mucosa, and tongue normal  NECK: supple, thyroid normal size, non-tender, without nodularity LYMPH:  no palpable lymphadenopathy in the cervical, axillary or inguinal LUNGS: clear to auscultation and percussion with normal breathing effort HEART: regular rate & rhythm and no murmurs and no lower extremity edema ABDOMEN:abdomen soft, non-tender and normal bowel sounds Musculoskeletal:no cyanosis of digits and no clubbing  NEURO: alert & oriented x 3 with fluent speech, no focal motor/sensory deficits   Labs:  Lab Results  Component Value Date   WBC 5.9 02/06/2017   HGB 8.1 (L) 02/06/2017   HCT 24.5 (L) 02/06/2017   MCV 84.5 02/06/2017   PLT 464 (H) 02/06/2017   NEUTROABS 5.5 02/06/2017    Lab Results  Component Value Date   NA 136 02/06/2017   K 3.7 02/06/2017   CL 103 02/06/2017   CO2 26 02/06/2017    Assessment & Plan:   Diffuse large B cell lymphoma ([place]) She has side effects from treatment including constipation and then recently diarrhea, fatigue and severe anemia Overall, her symptoms aretolerable She has receivedcycle 2 ofR-EPOCHchemotherapysince 12/17/18with dose adjustment due to her side  effects She has received rituximab on day 3 and will receive IT chemotherapy on day 4 She will return on February 10, 2017 for G-CSF support I plan to repeat imaging study after 2 cycles of chemotherapy  Anemia due to antineoplastic chemotherapy She has received blood transfusion on January 31, 2017.Ironstudiesareconsistent with anemia chronic illness She does not need blood transfusion I would recheck hemoglobin tomorrow She would receive a unit of blood if hemoglobin is less than 8   Nasal congestion, improved She has postnasal drip Recommend conservative management with Mucinex and Nasacort  Diarrhea, resolved The cause is unknown but could be due to recent chemotherapy I will reduce the dose of doxorubicin, etoposide and Cytoxan Restart Senokot due to high risk of constipation  Dehydrationand hypokalemia, resolved  CODE STATUS Full code  DVT prophylaxis Lovenox. We will hold on February 05, 2017 in anticipation for IT chemo around February 06, 2017  Discharge planning At the end of the week    Heath Lark, MD 02/06/2017  8:43 AM

## 2017-02-07 DIAGNOSIS — R0982 Postnasal drip: Secondary | ICD-10-CM

## 2017-02-07 LAB — CBC WITH DIFFERENTIAL/PLATELET
BASOS PCT: 0 %
Basophils Absolute: 0 10*3/uL (ref 0.0–0.1)
EOS ABS: 0 10*3/uL (ref 0.0–0.7)
EOS PCT: 0 %
HCT: 28.4 % — ABNORMAL LOW (ref 36.0–46.0)
Hemoglobin: 9.3 g/dL — ABNORMAL LOW (ref 12.0–15.0)
LYMPHS ABS: 0.3 10*3/uL — AB (ref 0.7–4.0)
Lymphocytes Relative: 9 %
MCH: 27.4 pg (ref 26.0–34.0)
MCHC: 32.7 g/dL (ref 30.0–36.0)
MCV: 83.5 fL (ref 78.0–100.0)
MONO ABS: 0.1 10*3/uL (ref 0.1–1.0)
MONOS PCT: 2 %
Neutro Abs: 2.6 10*3/uL (ref 1.7–7.7)
Neutrophils Relative %: 89 %
PLATELETS: 409 10*3/uL — AB (ref 150–400)
RBC: 3.4 MIL/uL — ABNORMAL LOW (ref 3.87–5.11)
RDW: 17.3 % — AB (ref 11.5–15.5)
WBC: 2.9 10*3/uL — ABNORMAL LOW (ref 4.0–10.5)

## 2017-02-07 LAB — TYPE AND SCREEN
ABO/RH(D): O NEG
ABO/RH(D): O NEG
ANTIBODY SCREEN: NEGATIVE
Antibody Screen: NEGATIVE

## 2017-02-07 MED ORDER — SENNOSIDES-DOCUSATE SODIUM 8.6-50 MG PO TABS
2.0000 | ORAL_TABLET | Freq: Two times a day (BID) | ORAL | Status: DC
  Administered 2017-02-07: 2 via ORAL
  Filled 2017-02-07: qty 2

## 2017-02-07 MED ORDER — POLYETHYLENE GLYCOL 3350 17 G PO PACK
17.0000 g | PACK | Freq: Every day | ORAL | Status: DC
  Administered 2017-02-07: 17 g via ORAL
  Filled 2017-02-07: qty 1

## 2017-02-07 MED ORDER — SODIUM CHLORIDE 0.9 % IV SOLN
600.0000 mg/m2 | Freq: Once | INTRAVENOUS | Status: AC
  Administered 2017-02-07: 1400 mg via INTRAVENOUS
  Filled 2017-02-07: qty 70

## 2017-02-07 MED ORDER — ONDANSETRON HCL 40 MG/20ML IJ SOLN
Freq: Once | INTRAMUSCULAR | Status: AC
  Administered 2017-02-07: 16 mg via INTRAVENOUS
  Filled 2017-02-07 (×2): qty 8

## 2017-02-07 NOTE — Progress Notes (Signed)
EPOCH infusion complete. Pt tolerated well.

## 2017-02-07 NOTE — Progress Notes (Signed)
Physical Therapy Treatment Patient Details Name: Katrina [name] MRN: [id number] DOB: Mar 31, 1950 Today's Date: 02/07/2017    History of Present Illness 66 year old female     PT Comments    Assisted OOB to amb 3 short distance due to MAX c/o weakness/fatigue.  See mobility details below.   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    Mobility  Bed Mobility Overal bed mobility: Needs Assistance Bed Mobility: Supine to Sit     Supine to sit: Min assist;Mod assist     General bed mobility comments: increased time and use of bed rail with elevated HOB  Transfers Overall transfer level: Needs assistance Equipment used: Rolling walker (2 wheeled) Transfers: Sit to/from Stand Sit to Stand: Min assist;Mod assist         General transfer comment: reminded hand placement and min help to power up  Ambulation/Gait Ambulation/Gait assistance: Min assist;Mod assist Ambulation Distance (Feet): 26 Feet(12 feet, 8 feet, 6 feet) Assistive device: Rolling walker (2 wheeled);1 person hand held assist   Gait velocity: decreased   General Gait Details: + 2 assist for safety such that recliner was following closely behind due to B LE weakness Hx cord compression   Stairs            Wheelchair Mobility    Modified Rankin (Stroke Patients Only)       Balance                                            Cognition Arousal/Alertness: Awake/alert Behavior During Therapy: WFL for tasks assessed/performed Overall Cognitive Status: Within Functional Limits for tasks assessed                                        Exercises      General Comments        Pertinent Vitals/Pain Pain Assessment: 0-10     Home Living                      Prior Function            PT Goals (current goals can now be found in the care plan section) Progress towards PT goals: Progressing toward goals    Frequency    Min 3X/week      PT Plan Current plan remains appropriate    Co-evaluation              AM-PAC PT "6 Clicks" Daily Activity  Outcome Measure  Difficulty turning over in bed (including adjusting bedclothes, sheets and blankets)?: A Little Difficulty moving from lying on back to sitting on the side of the bed? : Unable Difficulty sitting down on and standing up from a chair with arms (e.g., wheelchair, bedside commode, etc,.)?: Unable Help needed moving to and from a bed to chair (including a wheelchair)?: A Little Help needed walking in hospital room?: A Little Help needed climbing 3-5 steps with a railing? : Total 6 Click Score: 12    End of Session Equipment Utilized During Treatment: Gait belt Activity  Tolerance: Patient tolerated treatment well;Other (comment) Patient left: in chair;with chair alarm set;with call bell/phone within reach Nurse Communication: Mobility status PT Visit Diagnosis: Unsteadiness on feet (R26.81);Muscle weakness (generalized) (M62.81);Ataxic gait (R26.0);Adult, failure to thrive (R62.7)     Time: 4171-2787 PT Time Calculation (min) (ACUTE ONLY): 19 min  Charges:  $Gait Training: 8-22 mins                    G Codes:       Rica Koyanagi  PTA [facility]  Acute  Rehab Pager      [phone]

## 2017-02-07 NOTE — Discharge Summary (Signed)
Physician Discharge Summary  Patient ID: ANIESHA HAUGHN MRN: [id number] DOB/AGE: 65-23-1952 65 y.o.  Admit date: 02/03/2017 Discharge date: 02/07/2017  Primary Care Physician:  Patient, No Pcp Per   Discharge Diagnoses:    Present on Admission: . Diffuse large B cell lymphoma ([place]) . Anemia due to antineoplastic chemotherapy . Diarrhea . Thoracic myelopathy   Discharge Medications:  Allergies as of 02/07/2017      Reactions   Contrast Media [iodinated Diagnostic Agents] Itching      Medication List    TAKE these medications   acyclovir 400 MG tablet Commonly known as:  ZOVIRAX Take 1 tablet (400 mg total) by mouth 2 (two) times daily.   allopurinol 300 MG tablet Commonly known as:  ZYLOPRIM Take 1 tablet (300 mg total) by mouth daily.   amLODipine 10 MG tablet Commonly known as:  NORVASC Take 1 tablet (10 mg total) by mouth daily.   cyclobenzaprine 10 MG tablet Commonly known as:  FLEXERIL Take 1 tablet (10 mg total) by mouth 3 (three) times daily as needed for muscle spasms. What changed:  when to take this   lidocaine-prilocaine cream Commonly known as:  EMLA Apply 1 application topically as needed. What changed:  reasons to take this   morphine 15 MG 12 hr tablet Commonly known as:  MS CONTIN Take 1 tablet (15 mg total) by mouth every 12 (twelve) hours. What changed:    when to take this  reasons to take this   ondansetron 8 MG tablet Commonly known as:  ZOFRAN Take 1 tablet (8 mg total) by mouth every 8 (eight) hours as needed for nausea (not responsive to prochlorperazine (COMPAZINE)).   prochlorperazine 10 MG tablet Commonly known as:  COMPAZINE Take 1 tablet (10 mg total) by mouth every 6 (six) hours as needed for nausea or vomiting.   traMADol 50 MG tablet Commonly known as:  ULTRAM Take 2 tablets (100 mg total) by mouth every 6 (six) hours as needed for severe pain. What changed:  when to take this       Disposition and  Follow-up:   Significant Diagnostic Studies:  Dg Fluoro Guided Needle Plc Aspiration/injection Loc  Result Date: 02/06/2017 CLINICAL DATA:  Large B-cell lymphoma EXAM: LUMBAR PUNCTURE UNDER FLUOROSCOPIC GUIDANCE, INTRATHECAL METHOTREXATE ADMINISTRATION FLUOROSCOPY TIME:  Fluoroscopy Time:  0 MINUTES, 32 SECONDS Radiation Exposure Index (if provided by the fluoroscopic device): 8.4 mGy Number of Acquired Spot Images: 0 MEDICATIONS: 12 MG METHOTREXATE ADMINISTERED INTRATHECALLY PROCEDURE: I discussed the risks (including hemorrhage, infection, headache, and nerve damage, among others), benefits, and alternatives to fluoroscopically guided lumbar puncture and intrathecal administration of methotrexate with the patient. We specifically discussed the high technical likelihood of success of the procedure. The patient understood and elected to undergo the procedure. Standard time-out was employed. Following sterile skin prep and local anesthetic administration consisting of 1 percent lidocaine, a 22 gauge spinal needle was advanced without difficulty into the thecal sac at the at the L3-4 level. Clear CSF was returned. Opening pressure was not requested or performed. 12 mg of methotrexate in 5 mL was slowly administered intrathecally. The needle was subsequently removed and the skin cleansed and bandaged. No immediate complications were observed. IMPRESSION: 1. Successful intrathecal administration of methotrexate. Electronically Signed   By: Van Clines M.D.   On: 02/06/2017 14:17   Dg Fluoro Guided Needle Plc Aspiration/injection Loc  Result Date: 01/16/2017 CLINICAL DATA:  B-cell lymphoma EXAM: FLUOROSCOPICALLY GUIDED LUMBAR PUNCTURE FOR INTRATHECAL CHEMOTHERAPY TECHNIQUE:  Informed consent was obtained from the patient prior to the procedure, including potential complications of headache, allergy, and pain. A 'time out' was performed. With the patient prone, the lower back was prepped with Betadine. 1%  Lidocaine was used for local anesthesia. Lumbar puncture was performed at the L3-4 using a 20 gauge needle with return of clear CSF. 8 cc clear CSF S collected for testing. 12 mg Of methotrexate was injected into the subarachnoid space. The patient tolerated the procedure well without apparent complication. FLUOROSCOPY TIME:  13 seconds IMPRESSION: Intrathecal injection of chemotherapy without complication Electronically Signed   By: Rolm Baptise M.D.   On: 01/16/2017 10:11    Discharge Laboratory Values: Lab Results  Component Value Date   WBC 2.9 (L) 02/07/2017   HGB 9.3 (L) 02/07/2017   HCT 28.4 (L) 02/07/2017   MCV 83.5 02/07/2017   PLT 409 (H) 02/07/2017   Lab Results  Component Value Date   NA 136 02/06/2017   K 3.7 02/06/2017   CL 103 02/06/2017   CO2 26 02/06/2017    Brief H and P: For complete details please refer to admission H and P, but in brief, the patient was admitted on February 03, 2017 for cycle 2 of chemotherapy.  She tolerated treatment very well without major side effects of treatment  Physical Exam at Discharge: BP (!) 148/86 (BP Location: Left Arm)   Pulse 82   Temp 98.1 F (36.7 C) (Oral)   Resp 20   Ht 5\' 8"  (1.727 m)   Wt 225 lb 8.5 oz (102.3 kg)   SpO2 99%   BMI 34.29 kg/m  GENERAL:alert, no distress and comfortable SKIN: skin color, texture, turgor are normal, no rashes or significant lesions EYES: normal, Conjunctiva are pink and non-injected, sclera clear OROPHARYNX:no exudate, no erythema and lips, buccal mucosa, and tongue normal  NECK: supple, thyroid normal size, non-tender, without nodularity LYMPH:  no palpable lymphadenopathy in the cervical, axillary or inguinal LUNGS: clear to auscultation and percussion with normal breathing effort HEART: regular rate & rhythm and no murmurs and no lower extremity edema ABDOMEN:abdomen soft, non-tender and normal bowel sounds Musculoskeletal:no cyanosis of digits and no clubbing  NEURO: alert &  oriented x 3 with fluent speech, no focal motor/sensory deficits  Hospital Course:  Active Problems:   Diffuse large B cell lymphoma ([facility])   Anemia due to antineoplastic chemotherapy   Diarrhea   Thoracic myelopathy  Diffuse large B cell lymphoma ([place]) She has minimal side effects from treatment including constipation and then recently diarrhea, fatigue and anemia Overall, her symptoms aretolerable She has receivedcycle 2 ofR-EPOCHchemotherapysince 12/17/18with dose adjustment due to her side effects She has received rituximab on day 3 and IT chemotherapy on day 4 She will return on February 10, 2017 for G-CSF support I plan to repeat imaging study after 2 cycles of chemotherapy  Anemia due to antineoplastic chemotherapy She has received blood transfusion on January 31, 2017.Ironstudiesareconsistent with anemia chronic illness She does not need blood transfusion She would receive a unit of blood if hemoglobin is less than 8  Nasal congestion, improved She has postnasal drip Recommend conservative management with Mucinex and Nasacort only  Diarrhea, resolved The cause is unknown but could be due to recent chemotherapy I will reduce the dose of doxorubicin, etoposide and Cytoxan Restart Senokot due to high risk of constipation  Dehydrationand hypokalemia, resolved  CODE STATUS Full code  Diet:  Regular  Activity:  As tolerated  Condition at Discharge:  stable  Signed: Dr. Heath Lark [phone]  02/07/2017, 12:59 PM

## 2017-02-07 NOTE — Progress Notes (Signed)
Cytoxan infusion complete. Pt tolerated well.

## 2017-02-07 NOTE — Progress Notes (Signed)
Cytoxan dosage and calculations verified with Kirkland Hun, RN.

## 2017-02-10 ENCOUNTER — Ambulatory Visit (HOSPITAL_BASED_OUTPATIENT_CLINIC_OR_DEPARTMENT_OTHER): Payer: Medicare Other

## 2017-02-10 ENCOUNTER — Ambulatory Visit: Payer: Medicare Other

## 2017-02-10 VITALS — BP 155/85 | HR 100 | Temp 98.0°F | Resp 18

## 2017-02-10 DIAGNOSIS — Z5189 Encounter for other specified aftercare: Secondary | ICD-10-CM

## 2017-02-10 DIAGNOSIS — C8330 Diffuse large B-cell lymphoma, unspecified site: Secondary | ICD-10-CM | POA: Diagnosis not present

## 2017-02-10 MED ORDER — PEGFILGRASTIM INJECTION 6 MG/0.6ML ~~LOC~~
PREFILLED_SYRINGE | SUBCUTANEOUS | Status: AC
  Filled 2017-02-10: qty 0.6

## 2017-02-10 MED ORDER — PEGFILGRASTIM INJECTION 6 MG/0.6ML ~~LOC~~
6.0000 mg | PREFILLED_SYRINGE | Freq: Once | SUBCUTANEOUS | Status: AC
  Administered 2017-02-10: 6 mg via SUBCUTANEOUS

## 2017-02-10 NOTE — Patient Instructions (Signed)
Pegfilgrastim injection What is this medicine? PEGFILGRASTIM (PEG fil gra stim) is a long-acting granulocyte colony-stimulating factor that stimulates the growth of neutrophils, a type of white blood cell important in the body's fight against infection. It is used to reduce the incidence of fever and infection in patients with certain types of cancer who are receiving chemotherapy that affects the bone marrow, and to increase survival after being exposed to high doses of radiation. This medicine may be used for other purposes; ask your health care provider or pharmacist if you have questions. COMMON BRAND NAME(S): Neulasta What should I tell my health care provider before I take this medicine? They need to know if you have any of these conditions: -kidney disease -latex allergy -ongoing radiation therapy -sickle cell disease -skin reactions to acrylic adhesives (On-Body Injector only) -an unusual or allergic reaction to pegfilgrastim, filgrastim, other medicines, foods, dyes, or preservatives -pregnant or trying to get pregnant -breast-feeding How should I use this medicine? This medicine is for injection under the skin. If you get this medicine at home, you will be taught how to prepare and give the pre-filled syringe or how to use the On-body Injector. Refer to the patient Instructions for Use for detailed instructions. Use exactly as directed. Tell your healthcare provider immediately if you suspect that the On-body Injector may not have performed as intended or if you suspect the use of the On-body Injector resulted in a missed or partial dose. It is important that you put your used needles and syringes in a special sharps container. Do not put them in a trash can. If you do not have a sharps container, call your pharmacist or healthcare provider to get one. Talk to your pediatrician regarding the use of this medicine in children. While this drug may be prescribed for selected conditions,  precautions do apply. Overdosage: If you think you have taken too much of this medicine contact a poison control center or emergency room at once. NOTE: This medicine is only for you. Do not share this medicine with others. What if I miss a dose? It is important not to miss your dose. Call your doctor or health care professional if you miss your dose. If you miss a dose due to an On-body Injector failure or leakage, a new dose should be administered as soon as possible using a single prefilled syringe for manual use. What may interact with this medicine? Interactions have not been studied. Give your health care provider a list of all the medicines, herbs, non-prescription drugs, or dietary supplements you use. Also tell them if you smoke, drink alcohol, or use illegal drugs. Some items may interact with your medicine. This list may not describe all possible interactions. Give your health care provider a list of all the medicines, herbs, non-prescription drugs, or dietary supplements you use. Also tell them if you smoke, drink alcohol, or use illegal drugs. Some items may interact with your medicine. What should I watch for while using this medicine? You may need blood work done while you are taking this medicine. If you are going to need a MRI, CT scan, or other procedure, tell your doctor that you are using this medicine (On-Body Injector only). What side effects may I notice from receiving this medicine? Side effects that you should report to your doctor or health care professional as soon as possible: -allergic reactions like skin rash, itching or hives, swelling of the face, lips, or tongue -dizziness -fever -pain, redness, or irritation at site   where injected -pinpoint red spots on the skin -red or dark-brown urine -shortness of breath or breathing problems -stomach or side pain, or pain at the shoulder -swelling -tiredness -trouble passing urine or change in the amount of urine Side  effects that usually do not require medical attention (report to your doctor or health care professional if they continue or are bothersome): -bone pain -muscle pain This list may not describe all possible side effects. Call your doctor for medical advice about side effects. You may report side effects to FDA at 1-800-FDA-1088. Where should I keep my medicine? Keep out of the reach of children. Store pre-filled syringes in a refrigerator between 2 and 8 degrees C (36 and 46 degrees F). Do not freeze. Keep in carton to protect from light. Throw away this medicine if it is left out of the refrigerator for more than 48 hours. Throw away any unused medicine after the expiration date. NOTE: This sheet is a summary. It may not cover all possible information. If you have questions about this medicine, talk to your doctor, pharmacist, or health care provider.  2018 [employer]vier/Gold Standard (2016-02-01 12:58:03)  

## 2017-02-17 ENCOUNTER — Telehealth: Payer: Self-pay

## 2017-02-17 NOTE — Telephone Encounter (Signed)
Tressia Danas PT [facility] called, stated has made multiple attempts to contact patient to set up for Physical Therapy schedule, has been unsuccessful on multiple occasions.  Called him back to see if there are any phone numbers that we have in her chart, no answer, left message to call back otherwise I told him I will go ahead and forward message to doctor.

## 2017-02-18 DIAGNOSIS — C8590 Non-Hodgkin lymphoma, unspecified, unspecified site: Secondary | ICD-10-CM

## 2017-02-18 HISTORY — DX: Non-Hodgkin lymphoma, unspecified, unspecified site: C85.90

## 2017-02-19 ENCOUNTER — Other Ambulatory Visit: Payer: Self-pay | Admitting: Hematology and Oncology

## 2017-02-19 ENCOUNTER — Ambulatory Visit (HOSPITAL_COMMUNITY)
Admission: RE | Admit: 2017-02-19 | Discharge: 2017-02-19 | Disposition: A | Discharge status: Home / Self Care | Payer: Medicare Other | Source: Ambulatory Visit | Attending: Hematology and Oncology | Admitting: Hematology and Oncology

## 2017-02-19 ENCOUNTER — Other Ambulatory Visit (HOSPITAL_BASED_OUTPATIENT_CLINIC_OR_DEPARTMENT_OTHER): Payer: Medicare Other

## 2017-02-19 ENCOUNTER — Ambulatory Visit (HOSPITAL_BASED_OUTPATIENT_CLINIC_OR_DEPARTMENT_OTHER): Payer: Medicare Other

## 2017-02-19 ENCOUNTER — Ambulatory Visit: Payer: Medicare Other

## 2017-02-19 ENCOUNTER — Telehealth: Payer: Self-pay | Admitting: Hematology and Oncology

## 2017-02-19 ENCOUNTER — Ambulatory Visit (HOSPITAL_BASED_OUTPATIENT_CLINIC_OR_DEPARTMENT_OTHER): Payer: Medicare Other | Admitting: Hematology and Oncology

## 2017-02-19 VITALS — BP 151/78 | HR 108 | Temp 97.3°F | Resp 18 | Ht 68.0 in | Wt 210.6 lb

## 2017-02-19 VITALS — BP 144/78 | HR 94

## 2017-02-19 DIAGNOSIS — R110 Nausea: Secondary | ICD-10-CM | POA: Diagnosis not present

## 2017-02-19 DIAGNOSIS — C8330 Diffuse large B-cell lymphoma, unspecified site: Secondary | ICD-10-CM

## 2017-02-19 DIAGNOSIS — E860 Dehydration: Secondary | ICD-10-CM | POA: Diagnosis not present

## 2017-02-19 DIAGNOSIS — R634 Abnormal weight loss: Secondary | ICD-10-CM | POA: Diagnosis not present

## 2017-02-19 DIAGNOSIS — G8220 Paraplegia, unspecified: Secondary | ICD-10-CM

## 2017-02-19 DIAGNOSIS — D6481 Anemia due to antineoplastic chemotherapy: Secondary | ICD-10-CM

## 2017-02-19 DIAGNOSIS — K5909 Other constipation: Secondary | ICD-10-CM

## 2017-02-19 DIAGNOSIS — T451X5A Adverse effect of antineoplastic and immunosuppressive drugs, initial encounter: Secondary | ICD-10-CM

## 2017-02-19 LAB — CBC WITH DIFFERENTIAL/PLATELET
BASO%: 0.5 % (ref 0.0–2.0)
Basophils Absolute: 0 10*3/uL (ref 0.0–0.1)
EOS%: 0.2 % (ref 0.0–7.0)
Eosinophils Absolute: 0 10*3/uL (ref 0.0–0.5)
HEMATOCRIT: 26.3 % — AB (ref 34.8–46.6)
HGB: 8.8 g/dL — ABNORMAL LOW (ref 11.6–15.9)
LYMPH#: 0.8 10*3/uL — AB (ref 0.9–3.3)
LYMPH%: 9.2 % — ABNORMAL LOW (ref 14.0–49.7)
MCH: 28.2 pg (ref 25.1–34.0)
MCHC: 33.5 g/dL (ref 31.5–36.0)
MCV: 84.1 fL (ref 79.5–101.0)
MONO#: 1 10*3/uL — AB (ref 0.1–0.9)
MONO%: 11.5 % (ref 0.0–14.0)
NEUT%: 78.6 % — AB (ref 38.4–76.8)
NEUTROS ABS: 6.9 10*3/uL — AB (ref 1.5–6.5)
Platelets: 283 10*3/uL (ref 145–400)
RBC: 3.12 10*6/uL — AB (ref 3.70–5.45)
RDW: 19.7 % — AB (ref 11.2–14.5)
WBC: 8.8 10*3/uL (ref 3.9–10.3)

## 2017-02-19 LAB — COMPREHENSIVE METABOLIC PANEL
ALT: 13 U/L (ref 0–55)
AST: 16 U/L (ref 5–34)
Albumin: 3.4 g/dL — ABNORMAL LOW (ref 3.5–5.0)
Alkaline Phosphatase: 86 U/L (ref 40–150)
Anion Gap: 11 mEq/L (ref 3–11)
BILIRUBIN TOTAL: 0.65 mg/dL (ref 0.20–1.20)
BUN: 7.4 mg/dL (ref 7.0–26.0)
CHLORIDE: 94 meq/L — AB (ref 98–109)
CO2: 29 meq/L (ref 22–29)
CREATININE: 0.9 mg/dL (ref 0.6–1.1)
Calcium: 8.9 mg/dL (ref 8.4–10.4)
EGFR: >60 mL/min/{1.73_m2} (ref >60)
Glucose: 135 mg/dl (ref 70–140)
Potassium: 3 mEq/L — CL (ref 3.5–5.1)
Sodium: 134 mEq/L — ABNORMAL LOW (ref 136–145)
TOTAL PROTEIN: 6.4 g/dL (ref 6.4–8.3)

## 2017-02-19 MED ORDER — ACYCLOVIR 400 MG PO TABS: 400.0000 mg | ORAL_TABLET | Freq: Two times a day (BID) | ORAL | 9 refills | Status: DC

## 2017-02-19 MED ORDER — HEPARIN SOD (PORK) LOCK FLUSH 100 UNIT/ML IV SOLN
500.0000 [IU] | Freq: Once | INTRAVENOUS | Status: AC | PRN
  Administered 2017-02-19: 500 [IU]
  Filled 2017-02-19: qty 5

## 2017-02-19 MED ORDER — HEPARIN SOD (PORK) LOCK FLUSH 100 UNIT/ML IV SOLN
500.0000 [IU] | Freq: Once | INTRAVENOUS | Status: DC | PRN
  Filled 2017-02-19: qty 5

## 2017-02-19 MED ORDER — LACTULOSE 20 GM/30ML PO SOLN: 20.0000 g | Freq: Three times a day (TID) | ORAL | 1 refills | Status: DC

## 2017-02-19 MED ORDER — SODIUM CHLORIDE 0.9 % IV SOLN
Freq: Once | INTRAVENOUS | Status: AC
  Administered 2017-02-19: 11:00:00 via INTRAVENOUS

## 2017-02-19 MED ORDER — SODIUM CHLORIDE 0.9% FLUSH
10.0000 mL | INTRAVENOUS | Status: DC | PRN
  Administered 2017-02-19: 10 mL
  Filled 2017-02-19: qty 10

## 2017-02-19 NOTE — Telephone Encounter (Signed)
Gave patient AVS and calendar of upcoming January appointments °

## 2017-02-19 NOTE — Patient Instructions (Signed)

## 2017-02-21 ENCOUNTER — Encounter: Payer: Self-pay | Admitting: Hematology and Oncology

## 2017-02-21 ENCOUNTER — Telehealth: Payer: Self-pay | Admitting: *Deleted

## 2017-02-21 NOTE — Assessment & Plan Note (Signed)
She has fatigue but does not need blood transfusion We will monitor closely

## 2017-02-21 NOTE — Assessment & Plan Note (Signed)
She has severe constipation, without any bowel movement for almost 10 days We discussed aggressive laxative therapy

## 2017-02-21 NOTE — Assessment & Plan Note (Signed)
She has persistent weakness from recent surgery She would continue home PT

## 2017-02-21 NOTE — Assessment & Plan Note (Signed)
She has dehydration secondary to nausea I would proceed with antiemetics and IV fluid resuscitation

## 2017-02-21 NOTE — Telephone Encounter (Signed)
Notified of message below

## 2017-02-21 NOTE — Telephone Encounter (Signed)
Repeat either fleet enema or Dulcolax suppository, both OTC daily

## 2017-02-21 NOTE — Progress Notes (Signed)
[place] OFFICE PROGRESS NOTE  Patient Care Team: Patient, No Pcp Per as PCP - General (General Practice)  SUMMARY OF ONCOLOGIC HISTORY:   Diffuse large B cell lymphoma ([place])   12/12/2016 Imaging    CT scan showed  1. Extensive mesenteric, retroperitoneal, left hilar, mediastinal, left supraclavicular and left axillary adenopathy. Differential considerations include metastatic adenopathy, lymphoma and leukemia. The size of the nodes in the mesentery and retroperitoneum are suggestive of non-Hodgkin's lymphoma. 2. 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 Differential considerations include metastatic disease and lymphoma. A left upper lobe primary lung carcinoma is a possibility. 3. 3.2 cm solid right renal mass. Differential considerations include renal cell carcinoma and oncocytoma. 4. Small amount of free peritoneal fluid, most likely due to lymphatic obstruction by the mesenteric adenopathy. 5. Left fourth rib fracture. 6. Colonic diverticulosis. 7. Calcific coronary artery and aortic atherosclerosis. Aortic Atherosclerosis (ICD10-I70.0).       12/12/2016 - 12/14/2016 Hospital Admission    He was briefly admitted and evaluated for weakness.  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 Tumor fills the right neural foramina at T6-7 and T7-8 and completely replaces the T7 vertebral body.      12/18/2016 Surgery    She underwent procedure: Decompressive thoracic laminectomy from T5-T8 for resection of epidural tumor        12/18/2016 Pathology Results    1. Soft tissue mass, simple excision, thoracic dorsal  epidural mass - DIFFUSE LARGE B-CELL LYMPHOMA. - SEE ONCOLOGY TABLE. 2. Soft tissue mass, simple excision, thoracic dorsal epidural mass - DIFFUSE LARGE B-CELL LYMPHOMA. Microscopic Comment 1. LYMPHOMA Histologic type: Non-Hodgkin B-cell lymphoma: Diffuse large B-cell lymphoma. Grade (if applicable): High grade. Flow cytometry: N/A. Immunohistochemical stains: CD20. CD3, CD5, CD10, CD23, CD30, CD15, PAX-5, Ki-67, bcl-2, bcl-6, CD34. Touch preps/imprints: N/A. Comments: There is a diffuse infiltrate of atypical lymphocytes. The lymphocytes are medium to large in size with irregular nuclear contours. There are background smaller lymphocytes. While there are admixed smaller cells, there are diffuse areas of large cells. The large lymphocytes are positive for CD20, CD10, bcl-6, and bcl-2. CD3 and CD5 highlight admixed T-cells. CD34, CD30, CD23, and CD15 are negative. PAX-5 is positive. Ki-67 is variable with areas up to 70%. 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       12/18/2016 - 01/17/2017 Hospital Admission    The patient was readmitted to the hospital due to complete weakness secondary to spinal cord compression.  She underwent surgery and pathology report confirmed diagnosis of diffuse large B-cell lymphoma. She received IT chemo on 01/16/17      01/16/2017 Pathology Results    CEREBROSPINAL FLUID (SPECIMEN 1 OF 1 COLLECTED 01/16/17): NO MALIGNANT CELLS IDENTIFIED.      02/03/2017 - 02/07/2017 Hospital Admission    She is admitted for cycle 2 of chemo       INTERVAL HISTORY: Please see below for problem oriented charting. She is seen prior to cycle 3 of chemotherapy She was unable to complete  home physical therapy recently due to holidays She has developed severe constipation, without bowel movement for  over 10 days She uses Dulcolax and MiraLAX daily She did not call us for instructions to manage severe constipation She have some mild nausea and mildly she denies significant peripheral neuropathy.  No new neurological deficit The patient denies any recent signs or symptoms of bleeding such as spontaneous epistaxis, hematuria or hematochezia.  REVIEW OF SYSTEMS:   Constitutional: Denies fevers, chills or abnormal weight loss Eyes: Denies blurriness of vision Ears, nose, mouth, throat, and face: Denies mucositis or sore throat Respiratory: Denies cough, dyspnea or wheezes Cardiovascular: Denies palpitation, chest discomfort or lower extremity swelling Skin: Denies abnormal skin rashes Lymphatics: Denies new lymphadenopathy or easy bruising Neurological:Denies numbness, tingling or new weaknesses Behavioral/Psych: Mood is stable, no new changes  All other systems were reviewed with the patient and are negative.  I have reviewed the past medical history, past surgical history, social history and family history with the patient and they are unchanged from previous note.  ALLERGIES:  is allergic to contrast media [iodinated diagnostic agents].  MEDICATIONS:  Current Outpatient Medications  Medication Sig Dispense Refill  . acyclovir (ZOVIRAX) 400 MG tablet Take 1 tablet (400 mg total) by mouth 2 (two) times daily. 60 tablet 9  . amLODipine (NORVASC) 10 MG tablet Take 1 tablet (10 mg total) by mouth daily. 30 tablet 0  . cyclobenzaprine (FLEXERIL) 10 MG tablet Take 1 tablet (10 mg total) by mouth 3 (three) times daily as needed for muscle spasms. (Patient taking differently: Take 10 mg by mouth 2 (two) times daily. ) 90 tablet 1  . Lactulose 20 GM/30ML SOLN Take 30 mLs (20 g total) by mouth 3 (three) times daily. 473 mL 1  . lidocaine-prilocaine (EMLA) cream Apply 1 application topically as needed. (Patient taking differently: Apply 1 application topically as needed (access port). ) 30 g 6   . morphine (MS CONTIN) 15 MG 12 hr tablet Take 1 tablet (15 mg total) by mouth every 12 (twelve) hours. (Patient taking differently: Take 15 mg by mouth every 12 (twelve) hours as needed for pain. ) 60 tablet 0  . ondansetron (ZOFRAN) 8 MG tablet Take 1 tablet (8 mg total) by mouth every 8 (eight) hours as needed for nausea (not responsive to prochlorperazine (COMPAZINE)). 60 tablet 1  . prochlorperazine (COMPAZINE) 10 MG tablet Take 1 tablet (10 mg total) by mouth every 6 (six) hours as needed for nausea or vomiting. 60 tablet 1  . traMADol (ULTRAM) 50 MG tablet Take 2 tablets (100 mg total) by mouth every 6 (six) hours as needed for severe pain. (Patient taking differently: Take 100 mg by mouth 2 (two) times daily. ) 90 tablet 0   No current facility-administered medications for this visit.     PHYSICAL EXAMINATION: ECOG PERFORMANCE STATUS: 1 - Symptomatic but completely ambulatory  Vitals:   02/19/17 1005  BP: (!) 151/78  Pulse: (!) 108  Resp: 18  Temp: (!) 97.3 F (36.3 C)  SpO2: 100%   Filed Weights   02/19/17 1005  Weight: 210 lb 9.6 oz (95.5 kg)    GENERAL:alert, no distress and comfortable SKIN: skin color, texture, turgor are normal, no rashes or significant lesions EYES: normal, Conjunctiva are pink and non-injected, sclera clear OROPHARYNX:no exudate, no erythema and lips, buccal mucosa, and tongue normal  NECK: supple, thyroid normal size, non-tender, without nodularity LYMPH:  no palpable lymphadenopathy in the cervical, axillary or inguinal LUNGS: clear  to auscultation and percussion with normal breathing effort HEART: regular rate & rhythm and no murmurs and no lower extremity edema ABDOMEN:abdomen soft, non-tender and normal bowel sounds Musculoskeletal:no cyanosis of digits and no clubbing  NEURO: alert & oriented x 3 with fluent speech, no focal motor/sensory deficits  LABORATORY DATA:  I have reviewed the data as listed    Component Value Date/Time   NA  134 (L) 02/19/2017 0925   K 3.0 (LL) 02/19/2017 0925   CL 103 02/06/2017 0500   CO2 29 02/19/2017 0925   GLUCOSE 135 02/19/2017 0925   BUN 7.4 02/19/2017 0925   CREATININE 0.9 02/19/2017 0925   CALCIUM 8.9 02/19/2017 0925   PROT 6.4 02/19/2017 0925   ALBUMIN 3.4 (L) 02/19/2017 0925   AST 16 02/19/2017 0925   ALT 13 02/19/2017 0925   ALKPHOS 86 02/19/2017 0925   BILITOT 0.65 02/19/2017 0925   GFRNONAA >60 02/06/2017 0500   GFRAA >60 02/06/2017 0500    No results found for: SPEP, UPEP  Lab Results  Component Value Date   WBC 8.8 02/19/2017   NEUTROABS 6.9 (H) 02/19/2017   HGB 8.8 (L) 02/19/2017   HCT 26.3 (L) 02/19/2017   MCV 84.1 02/19/2017   PLT 283 02/19/2017      Chemistry      Component Value Date/Time   NA 134 (L) 02/19/2017 0925   K 3.0 (LL) 02/19/2017 0925   CL 103 02/06/2017 0500   CO2 29 02/19/2017 0925   BUN 7.4 02/19/2017 0925   CREATININE 0.9 02/19/2017 0925      Component Value Date/Time   CALCIUM 8.9 02/19/2017 0925   ALKPHOS 86 02/19/2017 0925   AST 16 02/19/2017 0925   ALT 13 02/19/2017 0925   BILITOT 0.65 02/19/2017 0925       RADIOGRAPHIC STUDIES: I have personally reviewed the radiological images as listed and agreed with the findings in the report. Dg Fluoro Guided Needle Plc Aspiration/injection Loc  Result Date: 02/06/2017 CLINICAL DATA:  Large B-cell lymphoma EXAM: LUMBAR PUNCTURE UNDER FLUOROSCOPIC GUIDANCE, INTRATHECAL METHOTREXATE ADMINISTRATION FLUOROSCOPY TIME:  Fluoroscopy Time:  0 MINUTES, 32 SECONDS Radiation Exposure Index (if provided by the fluoroscopic device): 8.4 mGy Number of Acquired Spot Images: 0 MEDICATIONS: 12 MG METHOTREXATE ADMINISTERED INTRATHECALLY PROCEDURE: I discussed the risks (including hemorrhage, infection, headache, and nerve damage, among others), benefits, and alternatives to fluoroscopically guided lumbar puncture and intrathecal administration of methotrexate with the patient. We specifically discussed  the high technical likelihood of success of the procedure. The patient understood and elected to undergo the procedure. Standard time-out was employed. Following sterile skin prep and local anesthetic administration consisting of 1 percent lidocaine, a 22 gauge spinal needle was advanced without difficulty into the thecal sac at the at the L3-4 level. Clear CSF was returned. Opening pressure was not requested or performed. 12 mg of methotrexate in 5 mL was slowly administered intrathecally. The needle was subsequently removed and the skin cleansed and bandaged. No immediate complications were observed. IMPRESSION: 1. Successful intrathecal administration of methotrexate. Electronically Signed   By: Van Clines M.D.   On: 02/06/2017 14:17    ASSESSMENT & PLAN:  Diffuse large B cell lymphoma (HCC) She tolerated recent chemotherapy poorly due to severe constipation and recent weight loss Her appetite was okay until recent severe constipation of which she developed some nausea Clinically, she is a bit dehydrated; I will give her some IVF  Her pain is well managed We discussed PET/CT imaging study which,  unfortunately, has not been scheduled We will proceed with chemotherapy and admission to the hospital on February 24, 2017 I will continue with dose reduction She would receive rituximab on day 3 and IT chemotherapy on day 4 Plan to repeat imaging study after cycle 3 of therapy She would receive G-CSF support on March 03, 2017  Anemia due to antineoplastic chemotherapy She has fatigue but does not need blood transfusion We will monitor closely  Paraparesis ([place]) She has persistent weakness from recent surgery She would continue home PT  Constipation She has severe constipation, without any bowel movement for almost 10 days We discussed aggressive laxative therapy  Dehydration She has dehydration secondary to nausea I would proceed with antiemetics and IV fluid  resuscitation   Orders Placed This Encounter  Procedures  . NM PET Image Initial (PI) Skull Base To Thigh    Standing Status:   Future    Standing Expiration Date:   02/19/2018    Order Specific Question:   If indicated for the ordered procedure, I authorize the administration of a radiopharmaceutical per Radiology protocol    Answer:   Yes    Order Specific Question:   Preferred imaging location?    Answer:   [facility]    Order Specific Question:   Radiology Contrast Protocol - do NOT remove file path    Answer:   [email or web address]   All questions were answered. The patient knows to call the clinic with any problems, questions or concerns. No barriers to learning was detected. I spent 30 minutes counseling the patient face to face. The total time spent in the appointment was 40 minutes and more than 50% was on counseling and review of test results     Heath Lark, MD 02/21/2017 8:07 AM

## 2017-02-21 NOTE — Assessment & Plan Note (Signed)
She tolerated recent chemotherapy poorly due to severe constipation and recent weight loss Her appetite was okay until recent severe constipation of which she developed some nausea Clinically, she is a bit dehydrated; I will give her some IVF  Her pain is well managed We discussed PET/CT imaging study which, unfortunately, has not been scheduled We will proceed with chemotherapy and admission to the hospital on February 24, 2017 I will continue with dose reduction She would receive rituximab on day 3 and IT chemotherapy on day 4 Plan to repeat imaging study after cycle 3 of therapy She would receive G-CSF support on March 03, 2017

## 2017-02-21 NOTE — Telephone Encounter (Signed)
States she has not had a BM yet. Is feeling the urge, but has not. She wants to know if she should repeat the enema?

## 2017-02-24 ENCOUNTER — Encounter (HOSPITAL_COMMUNITY): Payer: Self-pay

## 2017-02-24 ENCOUNTER — Telehealth: Payer: Self-pay | Admitting: Hematology and Oncology

## 2017-02-24 ENCOUNTER — Inpatient Hospital Stay (HOSPITAL_COMMUNITY)
Admission: RE | Admit: 2017-02-24 | Discharge: 2017-02-28 | DRG: 847 | Disposition: A | Discharge status: Home Health | Payer: Medicare Other | Source: Ambulatory Visit | Attending: Hematology and Oncology | Admitting: Hematology and Oncology

## 2017-02-24 ENCOUNTER — Other Ambulatory Visit: Payer: Self-pay

## 2017-02-24 DIAGNOSIS — R110 Nausea: Secondary | ICD-10-CM | POA: Diagnosis present

## 2017-02-24 DIAGNOSIS — Z9889 Other specified postprocedural states: Secondary | ICD-10-CM

## 2017-02-24 DIAGNOSIS — K5900 Constipation, unspecified: Secondary | ICD-10-CM | POA: Diagnosis present

## 2017-02-24 DIAGNOSIS — I10 Essential (primary) hypertension: Secondary | ICD-10-CM | POA: Diagnosis present

## 2017-02-24 DIAGNOSIS — C8330 Diffuse large B-cell lymphoma, unspecified site: Secondary | ICD-10-CM | POA: Diagnosis present

## 2017-02-24 DIAGNOSIS — Z90710 Acquired absence of both cervix and uterus: Secondary | ICD-10-CM

## 2017-02-24 DIAGNOSIS — D6481 Anemia due to antineoplastic chemotherapy: Secondary | ICD-10-CM

## 2017-02-24 DIAGNOSIS — Z95828 Presence of other vascular implants and grafts: Secondary | ICD-10-CM

## 2017-02-24 DIAGNOSIS — E876 Hypokalemia: Secondary | ICD-10-CM | POA: Diagnosis present

## 2017-02-24 DIAGNOSIS — K5909 Other constipation: Secondary | ICD-10-CM

## 2017-02-24 DIAGNOSIS — Z5111 Encounter for antineoplastic chemotherapy: Principal | ICD-10-CM

## 2017-02-24 DIAGNOSIS — Z79899 Other long term (current) drug therapy: Secondary | ICD-10-CM | POA: Diagnosis not present

## 2017-02-24 DIAGNOSIS — R197 Diarrhea, unspecified: Secondary | ICD-10-CM | POA: Diagnosis present

## 2017-02-24 DIAGNOSIS — T451X5A Adverse effect of antineoplastic and immunosuppressive drugs, initial encounter: Secondary | ICD-10-CM | POA: Diagnosis present

## 2017-02-24 DIAGNOSIS — R531 Weakness: Secondary | ICD-10-CM | POA: Diagnosis not present

## 2017-02-24 DIAGNOSIS — R202 Paresthesia of skin: Secondary | ICD-10-CM

## 2017-02-24 DIAGNOSIS — R200 Anesthesia of skin: Secondary | ICD-10-CM | POA: Diagnosis present

## 2017-02-24 MED ORDER — SODIUM CHLORIDE 0.9% FLUSH
10.0000 mL | INTRAVENOUS | Status: DC | PRN
  Administered 2017-02-26: 10 mL
  Filled 2017-02-24: qty 10

## 2017-02-24 MED ORDER — ONDANSETRON 4 MG PO TBDP: 4.0000 mg | ORAL_TABLET | Freq: Three times a day (TID) | ORAL | Status: DC | PRN

## 2017-02-24 MED ORDER — SODIUM CHLORIDE 0.9% FLUSH: 3.0000 mL | INTRAVENOUS | Status: DC | PRN

## 2017-02-24 MED ORDER — ONDANSETRON HCL 4 MG PO TABS: 4.0000 mg | ORAL_TABLET | Freq: Three times a day (TID) | ORAL | Status: DC | PRN

## 2017-02-24 MED ORDER — VINCRISTINE SULFATE CHEMO INJECTION 1 MG/ML
Freq: Once | INTRAVENOUS | Status: AC
  Administered 2017-02-24: 14:00:00 via INTRAVENOUS
  Filled 2017-02-24: qty 9

## 2017-02-24 MED ORDER — AMLODIPINE BESYLATE 10 MG PO TABS
10.0000 mg | ORAL_TABLET | Freq: Every day | ORAL | Status: DC
  Administered 2017-02-24 – 2017-02-28 (×5): 10 mg via ORAL
  Filled 2017-02-24 (×5): qty 1

## 2017-02-24 MED ORDER — SENNOSIDES-DOCUSATE SODIUM 8.6-50 MG PO TABS: 1.0000 | ORAL_TABLET | Freq: Every evening | ORAL | Status: DC | PRN

## 2017-02-24 MED ORDER — HEPARIN SOD (PORK) LOCK FLUSH 100 UNIT/ML IV SOLN: 250.0000 [IU] | Freq: Once | INTRAVENOUS | Status: DC | PRN

## 2017-02-24 MED ORDER — CYCLOBENZAPRINE HCL 10 MG PO TABS
10.0000 mg | ORAL_TABLET | Freq: Three times a day (TID) | ORAL | Status: DC | PRN
  Administered 2017-02-24 – 2017-02-27 (×5): 10 mg via ORAL
  Filled 2017-02-24 (×5): qty 1

## 2017-02-24 MED ORDER — SODIUM CHLORIDE 0.9 % IV SOLN
INTRAVENOUS | Status: DC
  Administered 2017-02-24: 11:00:00 via INTRAVENOUS

## 2017-02-24 MED ORDER — LIDOCAINE-PRILOCAINE 2.5-2.5 % EX CREA: 1.0000 "application " | TOPICAL_CREAM | CUTANEOUS | Status: DC | PRN

## 2017-02-24 MED ORDER — SENNOSIDES-DOCUSATE SODIUM 8.6-50 MG PO TABS
2.0000 | ORAL_TABLET | Freq: Two times a day (BID) | ORAL | Status: DC
  Administered 2017-02-24 – 2017-02-28 (×6): 2 via ORAL
  Filled 2017-02-24 (×8): qty 2

## 2017-02-24 MED ORDER — ACETAMINOPHEN 325 MG PO TABS: 650.0000 mg | ORAL_TABLET | ORAL | Status: DC | PRN

## 2017-02-24 MED ORDER — ACYCLOVIR 400 MG PO TABS
400.0000 mg | ORAL_TABLET | Freq: Two times a day (BID) | ORAL | Status: DC
  Administered 2017-02-24 – 2017-02-28 (×9): 400 mg via ORAL
  Filled 2017-02-24 (×9): qty 1

## 2017-02-24 MED ORDER — LACTULOSE 10 GM/15ML PO SOLN
20.0000 g | Freq: Three times a day (TID) | ORAL | Status: DC
  Administered 2017-02-24 – 2017-02-25 (×5): 20 g via ORAL
  Filled 2017-02-24 (×6): qty 30

## 2017-02-24 MED ORDER — SODIUM CHLORIDE 0.9 % IV SOLN
8.0000 mg | Freq: Three times a day (TID) | INTRAVENOUS | Status: DC | PRN
  Filled 2017-02-24: qty 4

## 2017-02-24 MED ORDER — ONDANSETRON HCL 40 MG/20ML IJ SOLN
Freq: Once | INTRAMUSCULAR | Status: AC
  Administered 2017-02-24: 8 mg via INTRAVENOUS
  Filled 2017-02-24: qty 4

## 2017-02-24 MED ORDER — ENOXAPARIN SODIUM 40 MG/0.4ML ~~LOC~~ SOLN
40.0000 mg | SUBCUTANEOUS | Status: DC
  Administered 2017-02-24 – 2017-02-25 (×2): 40 mg via SUBCUTANEOUS
  Filled 2017-02-24 (×2): qty 0.4

## 2017-02-24 MED ORDER — HOT PACK MISC ONCOLOGY
1.0000 | Freq: Once | Status: DC | PRN
  Filled 2017-02-24: qty 1

## 2017-02-24 MED ORDER — ONDANSETRON HCL 4 MG/2ML IJ SOLN: 4.0000 mg | Freq: Three times a day (TID) | INTRAMUSCULAR | Status: DC | PRN

## 2017-02-24 MED ORDER — ALTEPLASE 2 MG IJ SOLR
2.0000 mg | Freq: Once | INTRAMUSCULAR | Status: AC | PRN
  Administered 2017-02-26: 2 mg
  Filled 2017-02-24 (×2): qty 2

## 2017-02-24 MED ORDER — POLYETHYLENE GLYCOL 3350 17 G PO PACK
17.0000 g | PACK | Freq: Every day | ORAL | Status: DC
  Administered 2017-02-25 – 2017-02-28 (×3): 17 g via ORAL
  Filled 2017-02-24 (×4): qty 1

## 2017-02-24 MED ORDER — HEPARIN SOD (PORK) LOCK FLUSH 100 UNIT/ML IV SOLN
500.0000 [IU] | Freq: Once | INTRAVENOUS | Status: AC | PRN
  Administered 2017-02-28: 500 [IU]
  Filled 2017-02-24: qty 5

## 2017-02-24 MED ORDER — COLD PACK MISC ONCOLOGY
1.0000 | Freq: Once | Status: DC | PRN
  Filled 2017-02-24: qty 1

## 2017-02-24 MED ORDER — TRAMADOL HCL 50 MG PO TABS
100.0000 mg | ORAL_TABLET | Freq: Four times a day (QID) | ORAL | Status: DC | PRN
  Administered 2017-02-24 – 2017-02-27 (×4): 100 mg via ORAL
  Filled 2017-02-24 (×4): qty 2

## 2017-02-24 NOTE — Progress Notes (Signed)
Chemotherapy dosage calculation verified with Aldean Baker, RN.

## 2017-02-24 NOTE — Telephone Encounter (Signed)
Scheduled appt per 1/4 sch message - Patient is aware of appt date and time.

## 2017-02-24 NOTE — Progress Notes (Signed)
PT Cancellation Note  Patient Details Name: Katrina [name] MRN: [id number] DOB: [birth year]   Cancelled Treatment:    Reason Eval/Treat Not Completed: Fatigue/lethargy limiting ability to participate(Pt receiving chemo, currently sleeping well, did not disturb, will check back tomorrow for evaluation)   Katrina [name],[name] 02/24/2017, 3:54 PM Carmelia Bake, PT, DPT 02/24/2017 Pager: [phone]

## 2017-02-24 NOTE — H&P (Signed)
[place] ADMISSION NOTE  Patient Care Team: Patient, No Pcp Per as PCP - General (General Practice)  CHIEF COMPLAINTS/PURPOSE OF ADMISSION Diffuse large B-cell lymphoma, for cycle 3 of chemotherapy  HISTORY OF PRESENTING ILLNESS:  Katrina [name] 67 y.o. female is admitted for high dose, inpatient chemotherapy Summary of oncologic history as follows:   Diffuse large B cell lymphoma ([place])   12/12/2016 Imaging    CT scan showed  1. Extensive mesenteric, retroperitoneal, left hilar, mediastinal, left supraclavicular and left axillary adenopathy. Differential considerations include metastatic adenopathy, lymphoma and leukemia. The size of the nodes in the mesentery and retroperitoneum are suggestive of non-Hodgkin's lymphoma. 2. 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 Differential considerations include metastatic disease and lymphoma. A left upper lobe primary lung carcinoma is a possibility. 3. 3.2 cm solid right renal mass. Differential considerations include renal cell carcinoma and oncocytoma. 4. Small amount of free peritoneal fluid, most likely due to lymphatic obstruction by the mesenteric adenopathy. 5. Left fourth rib fracture. 6. Colonic diverticulosis. 7. Calcific coronary artery and aortic atherosclerosis. Aortic Atherosclerosis (ICD10-I70.0).       12/12/2016 - 12/14/2016 Hospital Admission    He was briefly admitted and evaluated for weakness.  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 Tumor fills the right neural foramina at T6-7 and T7-8 and completely replaces the T7 vertebral body.      12/18/2016 Surgery    She underwent procedure: Decompressive thoracic laminectomy from T5-T8 for resection of epidural tumor        12/18/2016 Pathology Results    1. Soft tissue mass, simple excision, thoracic dorsal epidural mass - DIFFUSE LARGE B-CELL LYMPHOMA. - SEE ONCOLOGY TABLE. 2. Soft tissue mass, simple excision, thoracic dorsal epidural mass - DIFFUSE LARGE B-CELL LYMPHOMA. Microscopic Comment 1. LYMPHOMA Histologic type: Non-Hodgkin B-cell lymphoma: Diffuse large B-cell lymphoma. Grade (if applicable): High grade. Flow cytometry: N/A. Immunohistochemical stains: CD20. CD3, CD5, CD10, CD23, CD30, CD15, PAX-5, Ki-67, bcl-2, bcl-6, CD34. Touch preps/imprints: N/A. Comments: There is a diffuse infiltrate of atypical lymphocytes. The lymphocytes are medium to large in size with irregular nuclear contours. There are background smaller lymphocytes. While there are admixed smaller cells, there are diffuse areas of large cells. The large lymphocytes are positive for CD20, CD10, bcl-6, and bcl-2. CD3 and CD5 highlight admixed T-cells. CD34, CD30, CD23, and CD15 are negative. PAX-5 is positive. Ki-67 is variable with areas up to 70%. 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       12/18/2016 - 01/17/2017 Hospital Admission    The patient was readmitted to the hospital due to complete weakness secondary to spinal cord compression.  She underwent surgery and pathology report confirmed diagnosis of diffuse large B-cell lymphoma. She received IT chemo on 01/16/17      01/16/2017 Pathology Results    CEREBROSPINAL FLUID (SPECIMEN 1 OF 1 COLLECTED 01/16/17): NO MALIGNANT CELLS IDENTIFIED.      02/03/2017 - 02/07/2017 Hospital Admission    She is admitted for cycle  2 of chemo      Since the last time I saw her, she  has developed significant constipation.  With aggressive laxative therapy, she subsequently had 2 bowel movement this weekend.  Her nausea has improved. She have crampy abdominal pain that comes and goes, improving. She has missed some home physical therapy due to the holidays.  She denies new neurological deficit.  MEDICAL HISTORY:  Past Medical History:  Diagnosis Date  . Anemia   . Arthritis   . 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 Hypertension   . Lymphadenopathy 11/2016   "extensive"  . Paresthesia of lower extremity 11/2016   bilateral  . Pulmonary nodules 11/2016  . Right renal mass 11/2016    SURGICAL HISTORY: Past Surgical History:  Procedure Laterality Date  . ABDOMINAL HYSTERECTOMY    . BREAST LUMPECTOMY     left  . COLONOSCOPY N/A 11/02/2013   Procedure: COLONOSCOPY;  Surgeon: Danie Binder, MD;  Location: [facility];  Service: Endoscopy;  Laterality: N/A;  1:15 - moved to [place] notified pt  . IR FLUORO GUIDE PORT INSERTION RIGHT  12/30/2016  . IR US GUIDE VASC ACCESS RIGHT  12/30/2016  . LAMINECTOMY N/A 12/18/2016   Procedure: DECOMPRESSION T5-8/THORACIC;  Surgeon: Kary Kos, MD;  Location: [facility];  Service: Neurosurgery;  Laterality: N/A;  Decompressive Thoracic Laminectomy, Thoracic five - six, Thoraic six - seven, Thoracic seven - eight  . SHOULDER SURGERY     right    SOCIAL HISTORY: Social History   Socioeconomic History  . Marital status: Widowed    Spouse name: Not on file  . Number of children: Not on file  . Years of education: Not on file  . Highest education level: Not on file  Social Needs  . Financial resource strain: Not on file  . Food insecurity - worry: Not on file  . Food insecurity - inability: Not on file  . Transportation needs - medical: Not on file  . Transportation needs - non-medical: Not on file  Occupational History  . Not on file  Tobacco Use  . Smoking status: Never Smoker  . Smokeless tobacco: Never Used  Substance and  Sexual Activity  . Alcohol use: No  . Drug use: No  . Sexual activity: No  Other Topics Concern  . Not on file  Social History Narrative  . Not on file    FAMILY HISTORY: Family History  Problem Relation Age of Onset  . Colon cancer Sister        at age 72  . Colon cancer Brother        at age 72  . COPD Mother   . Diabetes Mellitus II Mother   . CAD Mother        Angina  . Colon cancer Father   . Thyroid disease Sister   . Diabetes Mellitus II Sister     ALLERGIES:  is allergic to contrast media [iodinated diagnostic agents].  MEDICATIONS:  Current Facility-Administered Medications  Medication Dose Route Frequency Provider Last Rate Last Dose  . 0.9 %  sodium chloride infusion   Intravenous Continuous Seona Clemenson, MD      . 0.9 %  sodium chloride infusion   Intravenous Continuous Jarvin Ogren, MD      . acetaminophen (TYLENOL) tablet 650 mg  650 mg Oral Q4H PRN Mandy Peeks, MD      . acyclovir (ZOVIRAX) tablet 400 mg  400 mg Oral BID Heath Lark, MD      .  alteplase (CATHFLO ACTIVASE) injection 2 mg  2 mg Intracatheter Once PRN Alvy Bimler, Simran Mannis, MD      . amLODipine (NORVASC) tablet 10 mg  10 mg Oral Daily Alvy Bimler, Sandeep Delagarza, MD      . Cold Pack 1 packet  1 packet Topical Once PRN Alvy Bimler, Sehar Sedano, MD      . cyclobenzaprine (FLEXERIL) tablet 10 mg  10 mg Oral TID PRN Alvy Bimler, Lilymae Swiech, MD      . DOXOrubicin (ADRIAMYCIN) 18 mg, etoposide (VEPESID) 94 mg, vinCRIStine (ONCOVIN) 0.9 mg in sodium chloride 0.9 % 500 mL chemo infusion   Intravenous Once Alvy Bimler, Azael Ragain, MD      . enoxaparin (LOVENOX) injection 40 mg  40 mg Subcutaneous Q24H Jane Broughton, MD      . heparin lock flush 100 unit/mL  500 Units Intracatheter Once PRN Alvy Bimler, Ronny Ruddell, MD      . heparin lock flush 100 unit/mL  250 Units Intracatheter Once PRN Heath Lark, MD      . Hot Pack 1 packet  1 packet Topical Once PRN Alvy Bimler, Zandon Talton, MD      . lactulose (CHRONULAC) 10 GM/15ML solution 20 g  20 g Oral TID Alvy Bimler, Barbara Keng, MD      .  lidocaine-prilocaine (EMLA) cream 1 application  1 application Topical PRN Alvy Bimler, Makih Stefanko, MD      . ondansetron (ZOFRAN) tablet 4-8 mg  4-8 mg Oral Q8H PRN Alvy Bimler, Hervey Wedig, MD       Or  . ondansetron (ZOFRAN-ODT) disintegrating tablet 4-8 mg  4-8 mg Oral Q8H PRN Alvy Bimler, Cimberly Stoffel, MD       Or  . ondansetron (ZOFRAN) injection 4 mg  4 mg Intravenous Q8H PRN Renne Platts, MD       Or  . ondansetron (ZOFRAN) 8 mg in sodium chloride 0.9 % 50 mL IVPB  8 mg Intravenous Q8H PRN Warnell Rasnic, MD      . ondansetron (ZOFRAN) 8 mg, dexamethasone (DECADRON) 10 mg in sodium chloride 0.9 % 50 mL IVPB   Intravenous Once Alvy Bimler, Brylin Stanislawski, MD      . senna-docusate (Senokot-S) tablet 1 tablet  1 tablet Oral QHS PRN Alvy Bimler, Burdette Forehand, MD      . sodium chloride flush (NS) 0.9 % injection 10 mL  10 mL Intracatheter PRN Makenize Messman, MD      . sodium chloride flush (NS) 0.9 % injection 3 mL  3 mL Intravenous PRN Alvy Bimler, Elsye Mccollister, MD      . traMADol (ULTRAM) tablet 100 mg  100 mg Oral Q6H PRN Alvy Bimler, Sou Nohr, MD        REVIEW OF SYSTEMS:   Constitutional: Denies fevers, chills or abnormal night sweats Eyes: Denies blurriness of vision, double vision or watery eyes Ears, nose, mouth, throat, and face: Denies mucositis or sore throat Respiratory: Denies cough, dyspnea or wheezes Cardiovascular: Denies palpitation, chest discomfort or lower extremity swelling Skin: Denies abnormal skin rashes Lymphatics: Denies new lymphadenopathy or easy bruising Neurological:Denies numbness, tingling or new weaknesses Behavioral/Psych: Mood is stable, no new changes  All other systems were reviewed with the patient and are negative.  PHYSICAL EXAMINATION: ECOG PERFORMANCE STATUS: 1 - Symptomatic but completely ambulatory  Vitals:   02/24/17 0815  BP: (!) 144/90  Pulse: (!) 101  Resp: 20  Temp: 97.7 F (36.5 C)  SpO2: 96%   Filed Weights   02/24/17 0815  Weight: 208 lb 8 oz (94.6 kg)    GENERAL:alert, no distress and comfortable SKIN: skin color,  texture, turgor are  normal, no rashes or significant lesions EYES: normal, conjunctiva are pink and non-injected, sclera clear OROPHARYNX:no exudate, no erythema and lips, buccal mucosa, and tongue normal  NECK: supple, thyroid normal size, non-tender, without nodularity LYMPH:  no palpable lymphadenopathy in the cervical, axillary or inguinal LUNGS: clear to auscultation and percussion with normal breathing effort HEART: regular rate & rhythm and no murmurs and no lower extremity edema ABDOMEN:abdomen soft, non-tender and normal bowel sounds Musculoskeletal:no cyanosis of digits and no clubbing  PSYCH: alert & oriented x 3 with fluent speech NEURO: no focal motor/sensory deficits  LABORATORY DATA:  I have reviewed the data as listed Lab Results  Component Value Date   WBC 8.8 02/19/2017   HGB 8.8 (L) 02/19/2017   HCT 26.3 (L) 02/19/2017   MCV 84.1 02/19/2017   PLT 283 02/19/2017   Recent Labs    01/31/17 1102 02/03/17 0950 02/04/17 0630 02/06/17 0500 02/19/17 0925  NA 132* 132* 135 136 134*  K 3.3* 2.8* 3.8 3.7 3.0*  CL  --  92* 99* 103  --   CO2 26 29 27 26 29   GLUCOSE 119 116* 134* 132* 135  BUN 7.7 7 11 14  7.4  CREATININE 0.8 0.82 0.65 0.56 0.9  CALCIUM 8.9 8.5* 8.8* 8.5* 8.9  GFRNONAA  --  >60 >60 >60  --   GFRAA  --  >60 >60 >60  --   PROT 6.7 6.1*  --   --  6.4  ALBUMIN 3.0* 3.0*  --   --  3.4*  AST 16 19  --   --  16  ALT 10 12*  --   --  13  ALKPHOS 85 72  --   --  86  BILITOT 0.63 1.0  --   --  0.65    RADIOGRAPHIC STUDIES: I have personally reviewed the radiological images as listed and agreed with the findings in the report. Dg Fluoro Guided Needle Plc Aspiration/injection Loc  Result Date: 02/06/2017 CLINICAL DATA:  Large B-cell lymphoma EXAM: LUMBAR PUNCTURE UNDER FLUOROSCOPIC GUIDANCE, INTRATHECAL METHOTREXATE ADMINISTRATION FLUOROSCOPY TIME:  Fluoroscopy Time:  0 MINUTES, 32 SECONDS Radiation Exposure Index (if provided by the fluoroscopic device):  8.4 mGy Number of Acquired Spot Images: 0 MEDICATIONS: 12 MG METHOTREXATE ADMINISTERED INTRATHECALLY PROCEDURE: I discussed the risks (including hemorrhage, infection, headache, and nerve damage, among others), benefits, and alternatives to fluoroscopically guided lumbar puncture and intrathecal administration of methotrexate with the patient. We specifically discussed the high technical likelihood of success of the procedure. The patient understood and elected to undergo the procedure. Standard time-out was employed. Following sterile skin prep and local anesthetic administration consisting of 1 percent lidocaine, a 22 gauge spinal needle was advanced without difficulty into the thecal sac at the at the L3-4 level. Clear CSF was returned. Opening pressure was not requested or performed. 12 mg of methotrexate in 5 mL was slowly administered intrathecally. The needle was subsequently removed and the skin cleansed and bandaged. No immediate complications were observed. IMPRESSION: 1. Successful intrathecal administration of methotrexate. Electronically Signed   By: Van Clines M.D.   On: 02/06/2017 14:17    ASSESSMENT & PLAN:   Diffuse large B cell lymphoma ([facility]) She has side effects from treatment including constipation and then recently diarrhea, fatigue and severe anemia Overall, her symptoms aretolerable She is admitted today cycle 3 ofR-EPOCHchemotherapy with dose adjustment due to her side effects.  She will receive rituximab on day 3 and IT chemotherapy on day 4 She will  return on 03/03/17 for G-CSF support I plan to repeat imaging study after 3 cycles of chemotherapy The risks, benefits, side effects of chemotherapy are discussed with the patient and she agreed to proceed  Anemia due to antineoplastic chemotherapy She has received blood transfusion on January 31, 2017.  Plan to recheck tomorrow   Severe nausea and constipation Needed to side effects of chemotherapy and  constipation. I would continue aggressive laxative therapy  CODE STATUS Full code  DVT prophylaxis Will hold Lovenox after tomorrow's dose in anticipation for IT chemotherapy on February 27, 2017.  Physical weakness secondary to thoracic surgery I will consult physical therapy.  Discharge planning At the end of the week   All questions were answered. The patient knows to call the clinic with any problems, questions or concerns.    Heath Lark, MD 02/24/2017 10:39 AM

## 2017-02-25 DIAGNOSIS — R531 Weakness: Secondary | ICD-10-CM

## 2017-02-25 DIAGNOSIS — E876 Hypokalemia: Secondary | ICD-10-CM

## 2017-02-25 LAB — TYPE AND SCREEN
ABO/RH(D): O NEG
ANTIBODY SCREEN: NEGATIVE

## 2017-02-25 LAB — COMPREHENSIVE METABOLIC PANEL
ALT: 11 U/L — ABNORMAL LOW (ref 14–54)
AST: 28 U/L (ref 15–41)
Albumin: 3.2 g/dL — ABNORMAL LOW (ref 3.5–5.0)
Alkaline Phosphatase: 69 U/L (ref 38–126)
Anion gap: 10 (ref 5–15)
BUN: 10 mg/dL (ref 6–20)
CHLORIDE: 101 mmol/L (ref 101–111)
CO2: 24 mmol/L (ref 22–32)
Calcium: 8.5 mg/dL — ABNORMAL LOW (ref 8.9–10.3)
Creatinine, Ser: 0.84 mg/dL (ref 0.44–1.00)
Glucose, Bld: 172 mg/dL — ABNORMAL HIGH (ref 65–99)
Potassium: 3.1 mmol/L — ABNORMAL LOW (ref 3.5–5.1)
Sodium: 135 mmol/L (ref 135–145)
Total Bilirubin: 0.5 mg/dL (ref 0.3–1.2)
Total Protein: 5.9 g/dL — ABNORMAL LOW (ref 6.5–8.1)

## 2017-02-25 LAB — CBC WITH DIFFERENTIAL/PLATELET
BASOS ABS: 0 10*3/uL (ref 0.0–0.1)
Basophils Relative: 0 %
EOS ABS: 0 10*3/uL (ref 0.0–0.7)
Eosinophils Relative: 0 %
HCT: 25 % — ABNORMAL LOW (ref 36.0–46.0)
Hemoglobin: 8.2 g/dL — ABNORMAL LOW (ref 12.0–15.0)
LYMPHS PCT: 8 %
Lymphs Abs: 0.8 10*3/uL (ref 0.7–4.0)
MCH: 28.6 pg (ref 26.0–34.0)
MCHC: 32.8 g/dL (ref 30.0–36.0)
MCV: 87.1 fL (ref 78.0–100.0)
MONO ABS: 0.5 10*3/uL (ref 0.1–1.0)
Monocytes Relative: 5 %
Neutro Abs: 9 10*3/uL — ABNORMAL HIGH (ref 1.7–7.7)
Neutrophils Relative %: 87 %
PLATELETS: 377 10*3/uL (ref 150–400)
RBC: 2.87 MIL/uL — ABNORMAL LOW (ref 3.87–5.11)
RDW: 19.9 % — ABNORMAL HIGH (ref 11.5–15.5)
WBC: 10.3 10*3/uL (ref 4.0–10.5)

## 2017-02-25 MED ORDER — VINCRISTINE SULFATE CHEMO INJECTION 1 MG/ML
Freq: Once | INTRAVENOUS | Status: AC
  Administered 2017-02-25: 14:00:00 via INTRAVENOUS
  Filled 2017-02-25: qty 9

## 2017-02-25 MED ORDER — SODIUM CHLORIDE 0.9 % IV SOLN
Freq: Once | INTRAVENOUS | Status: AC
  Administered 2017-02-25: 18 mg via INTRAVENOUS
  Filled 2017-02-25: qty 4

## 2017-02-25 MED ORDER — POTASSIUM CHLORIDE CRYS ER 20 MEQ PO TBCR
20.0000 meq | EXTENDED_RELEASE_TABLET | Freq: Two times a day (BID) | ORAL | Status: AC
  Administered 2017-02-25 (×2): 20 meq via ORAL
  Filled 2017-02-25 (×2): qty 1

## 2017-02-25 NOTE — Progress Notes (Signed)
Katrina [name]   DOB:08-25-1950   IH#:[id number]    Subjective: She felt weak and fatigued.  She denies nausea.  She continues to have regular bowel movement with laxatives.  Her pain in her lower back has improved today.  Objective:  Vitals:   02/25/17 0434 02/25/17 1225  BP: 127/69 127/79  Pulse: 88 99  Resp: 18 18  Temp: 98.1 F (36.7 C) 97.6 F (36.4 C)  SpO2: 99% 100%     Intake/Output Summary (Last 24 hours) at 02/25/2017 1506 Last data filed at 02/25/2017 1225 Gross per 24 hour  Intake 1972.25 ml  Output -  Net 1972.25 ml    GENERAL:alert, no distress and comfortable SKIN: skin color, texture, turgor are normal, no rashes or significant lesions EYES: normal, Conjunctiva are pink and non-injected, sclera clear OROPHARYNX:no exudate, no erythema and lips, buccal mucosa, and tongue normal  NECK: supple, thyroid normal size, non-tender, without nodularity LYMPH:  no palpable lymphadenopathy in the cervical, axillary or inguinal LUNGS: clear to auscultation and percussion with normal breathing effort HEART: regular rate & rhythm and no murmurs and no lower extremity edema ABDOMEN:abdomen soft, non-tender and normal bowel sounds Musculoskeletal:no cyanosis of digits and no clubbing  NEURO: alert & oriented x 3 with fluent speech, no focal motor/sensory deficits   Labs:  Lab Results  Component Value Date   WBC 10.3 02/25/2017   HGB 8.2 (L) 02/25/2017   HCT 25.0 (L) 02/25/2017   MCV 87.1 02/25/2017   PLT 377 02/25/2017   NEUTROABS 9.0 (H) 02/25/2017    Lab Results  Component Value Date   NA 135 02/25/2017   K 3.1 (L) 02/25/2017   CL 101 02/25/2017   CO2 24 02/25/2017   Assessment & Plan:   Diffuse large B cell lymphoma ([place]) She has side effects from treatment including constipation and then recently diarrhea, fatigue and severe anemia Overall, her symptoms aretolerable She is admitted for cycle 3 ofR-EPOCHchemotherapy with dose adjustment due to her side  effects.  She will receive rituximab on day 3 and IT chemotherapy on day 4 She will return on 03/03/17 for G-CSF support I plan to repeat imaging study after 3 cycles of chemotherapy  Anemia due to antineoplastic chemotherapy She has received blood transfusion on January 31, 2017.  Repeat hemoglobin today is 8.2.  She does not need blood transfusion.  I will discontinue IV fluids in case this could be due to hemodilution  Severe nausea and constipation, resolved Due to side effects of chemotherapy and constipation. I would continue aggressive laxative therapy  Hypokalemia Could be due to hemodilution Will order potassium replacement therapy and recheck tomorrow  CODE STATUS Full code  DVT prophylaxis Will hold Lovenox after today's dose in anticipation for IT chemotherapy on February 27, 2017.  Physical weakness secondary to thoracic surgery I will consult physical therapy.  Discharge planning At the end of the week  Heath Lark, MD 02/25/2017  3:06 PM

## 2017-02-25 NOTE — Progress Notes (Signed)
Pt has used [facility] in the past for HHPT and would like to use them again at discharge. [facility] rep alerted of referral and will need MD order for HHPT. Marney Doctor RN,BSN,NCM [phone]

## 2017-02-25 NOTE — Evaluation (Signed)
Physical Therapy Evaluation Patient Details Name: Katrina [name] MRN: [id number] DOB: 11/17/1950 Today's Date: 02/25/2017   History of Present Illness  Pt with dx of diffuse Large B-cell Lymphoma and admitted for 5 day chemo cycle.    Clinical Impression  Pt admitted as above and presenting with functional mobility limitations 2* bil LE weakness and neuropathy and balance deficits.  Pt should progress to return home with family assist 24/7    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 and weakness Restrictions Weight Bearing Restrictions: No      Mobility  Bed Mobility Overal bed mobility: Needs Assistance Bed Mobility: Supine to Sit Rolling: Supervision Sidelying to sit: Supervision       General bed mobility comments: increased time and use of bed rail with elevated HOB  Transfers Overall transfer level: Needs assistance Equipment used: Rolling walker (2 wheeled) Transfers: Sit to/from Stand Sit to Stand: Min assist         General transfer comment: reminded hand placement and min help to power up  Ambulation/Gait Ambulation/Gait assistance: Min assist;Mod assist Ambulation Distance (Feet): 33 Feet Assistive device: Rolling walker (2 wheeled) Gait Pattern/deviations: Step-through pattern;Decreased step length - right;Decreased step length - left;Shuffle;Antalgic;Trunk flexed;Wide base of support;Ataxic Gait velocity: decreased Gait velocity interpretation: Below normal speed for age/gender General Gait Details: Pt requiring increased concentration for foot placement (L>R) 2* neuropathy.  Stairs            Wheelchair Mobility    Modified Rankin (Stroke Patients Only)       Balance Overall balance assessment: Needs  assistance Sitting-balance support: Feet supported Sitting balance-Leahy Scale: Good     Standing balance support: Bilateral upper extremity supported;During functional activity Standing balance-Leahy Scale: Poor Standing balance comment: Reliant on UE support                             Pertinent Vitals/Pain Pain Assessment: No/denies pain    Home Living Family/patient expects to be discharged to:: Private residence Living Arrangements: Other relatives;Children Available Help at Discharge: Family Type of Home: House Home Access: Ramped entrance     Home Layout: One level Home Equipment: [place] - 2 wheels;Wheelchair - Sport and exercise psychologist Comments: staying with son and able to have 24/7 care    Prior Function Level of Independence: Needs assistance   Gait / Transfers Assistance Needed: walks with RW short distances and uses WC otherwise  ADL's / Homemaking Assistance Needed: family to assist  Comments: Pt has been staying at son's house past few days. Son and DIL have been assisting with ADLs and IADLs     Hand Dominance   Dominant Hand: Right    Extremity/Trunk Assessment   Upper Extremity Assessment Upper Extremity Assessment: Overall WFL for tasks assessed    Lower Extremity Assessment Lower Extremity Assessment: Generalized weakness RLE Sensation: decreased light touch;decreased proprioception LLE Sensation: decreased light touch;decreased proprioception    Cervical / Trunk Assessment Cervical / Trunk Assessment: Normal Cervical / Trunk Exceptions: previous laminectomy for T6-8 tumor  Communication   Communication: No difficulties  Cognition Arousal/Alertness: Awake/alert Behavior During Therapy: WFL for tasks assessed/performed Overall Cognitive Status: Within Functional Limits for tasks assessed  General Comments      Exercises     Assessment/Plan    PT Assessment  Patient needs continued PT services  PT Problem List Decreased strength;Decreased range of motion;Decreased activity tolerance;Decreased balance;Decreased mobility;Decreased coordination;Decreased knowledge of use of DME;Obesity;Impaired sensation       PT Treatment Interventions DME instruction;Gait training;Functional mobility training;Therapeutic activities;Therapeutic exercise;Balance training;Neuromuscular re-education;Patient/family education    PT Goals (Current goals can be found in the Care Plan section)  Acute Rehab PT Goals Patient Stated Goal: get home and continue to feel stronger PT Goal Formulation: With patient Time For Goal Achievement: 03/11/17 Potential to Achieve Goals: Good    Frequency Min 3X/week   Barriers to discharge        Co-evaluation               AM-PAC PT "6 Clicks" Daily Activity  Outcome Measure Difficulty turning over in bed (including adjusting bedclothes, sheets and blankets)?: A Little Difficulty moving from lying on back to sitting on the side of the bed? : A Lot Difficulty sitting down on and standing up from a chair with arms (e.g., wheelchair, bedside commode, etc,.)?: Unable Help needed moving to and from a bed to chair (including a wheelchair)?: A Little Help needed walking in hospital room?: A Little Help needed climbing 3-5 steps with a railing? : A Lot 6 Click Score: 14    End of Session Equipment Utilized During Treatment: Gait belt Activity Tolerance: Patient tolerated treatment well;Other (comment) Patient left: in chair;with call bell/phone within reach Nurse Communication: Mobility status PT Visit Diagnosis: Unsteadiness on feet (R26.81);Muscle weakness (generalized) (M62.81);Ataxic gait (R26.0)    Time: 0762-2633 PT Time Calculation (min) (ACUTE ONLY): 23 min   Charges:   PT Evaluation $PT Eval Low Complexity: 1 Low PT Treatments $Gait Training: 8-22 mins   PT G Codes:        Pg [phone]  [id number]   Kaydon Creedon 02/25/2017, 10:51 AM

## 2017-02-26 LAB — BASIC METABOLIC PANEL
Anion gap: 4 — ABNORMAL LOW (ref 5–15)
BUN: 12 mg/dL (ref 6–20)
CALCIUM: 8 mg/dL — AB (ref 8.9–10.3)
CHLORIDE: 107 mmol/L (ref 101–111)
CO2: 24 mmol/L (ref 22–32)
CREATININE: 0.71 mg/dL (ref 0.44–1.00)
GFR calc non Af Amer: >60 mL/min (ref >60)
Glucose, Bld: 172 mg/dL — ABNORMAL HIGH (ref 65–99)
Potassium: 3.2 mmol/L — ABNORMAL LOW (ref 3.5–5.1)
SODIUM: 135 mmol/L (ref 135–145)

## 2017-02-26 LAB — CBC WITH DIFFERENTIAL/PLATELET
BASOS PCT: 0 %
Basophils Absolute: 0 10*3/uL (ref 0.0–0.1)
EOS ABS: 0 10*3/uL (ref 0.0–0.7)
EOS PCT: 0 %
HCT: 24.3 % — ABNORMAL LOW (ref 36.0–46.0)
HEMOGLOBIN: 8.1 g/dL — AB (ref 12.0–15.0)
LYMPHS ABS: 0.6 10*3/uL — AB (ref 0.7–4.0)
Lymphocytes Relative: 7 %
MCH: 29.2 pg (ref 26.0–34.0)
MCHC: 33.3 g/dL (ref 30.0–36.0)
MCV: 87.7 fL (ref 78.0–100.0)
MONO ABS: 0.5 10*3/uL (ref 0.1–1.0)
MONOS PCT: 6 %
NEUTROS PCT: 87 %
Neutro Abs: 7.4 10*3/uL (ref 1.7–7.7)
PLATELETS: 331 10*3/uL (ref 150–400)
RBC: 2.77 MIL/uL — ABNORMAL LOW (ref 3.87–5.11)
RDW: 20 % — AB (ref 11.5–15.5)
WBC: 8.6 10*3/uL (ref 4.0–10.5)

## 2017-02-26 LAB — MAGNESIUM: Magnesium: 1.7 mg/dL (ref 1.7–2.4)

## 2017-02-26 MED ORDER — POTASSIUM CHLORIDE CRYS ER 20 MEQ PO TBCR
20.0000 meq | EXTENDED_RELEASE_TABLET | Freq: Two times a day (BID) | ORAL | Status: AC
  Administered 2017-02-26 – 2017-02-27 (×4): 20 meq via ORAL
  Filled 2017-02-26 (×4): qty 1

## 2017-02-26 MED ORDER — ACETAMINOPHEN 325 MG PO TABS
650.0000 mg | ORAL_TABLET | Freq: Once | ORAL | Status: AC
  Administered 2017-02-26: 650 mg via ORAL
  Filled 2017-02-26: qty 2

## 2017-02-26 MED ORDER — HEPARIN SOD (PORK) LOCK FLUSH 100 UNIT/ML IV SOLN: 250.0000 [IU] | Freq: Once | INTRAVENOUS | Status: DC | PRN

## 2017-02-26 MED ORDER — EPINEPHRINE PF 1 MG/10ML IJ SOSY: 0.2500 mg | PREFILLED_SYRINGE | Freq: Once | INTRAMUSCULAR | Status: DC | PRN

## 2017-02-26 MED ORDER — EPINEPHRINE PF 1 MG/ML IJ SOLN
0.5000 mg | Freq: Once | INTRAMUSCULAR | Status: DC | PRN
  Filled 2017-02-26: qty 1

## 2017-02-26 MED ORDER — SODIUM CHLORIDE 0.9% FLUSH: 3.0000 mL | INTRAVENOUS | Status: DC | PRN

## 2017-02-26 MED ORDER — SODIUM CHLORIDE 0.9 % IV SOLN
Freq: Once | INTRAVENOUS | Status: AC
  Administered 2017-02-26: 8 mg via INTRAVENOUS
  Filled 2017-02-26: qty 4

## 2017-02-26 MED ORDER — DIPHENHYDRAMINE HCL 50 MG PO CAPS
50.0000 mg | ORAL_CAPSULE | Freq: Once | ORAL | Status: AC
  Administered 2017-02-26: 50 mg via ORAL
  Filled 2017-02-26: qty 1

## 2017-02-26 MED ORDER — RITUXIMAB CHEMO INJECTION 500 MG/50ML
375.0000 mg/m2 | Freq: Once | INTRAVENOUS | Status: AC
  Administered 2017-02-26: 800 mg via INTRAVENOUS
  Filled 2017-02-26: qty 30

## 2017-02-26 MED ORDER — VINCRISTINE SULFATE CHEMO INJECTION 1 MG/ML
Freq: Once | INTRAVENOUS | Status: AC
  Administered 2017-02-26: 18:00:00 via INTRAVENOUS
  Filled 2017-02-26: qty 9

## 2017-02-26 MED ORDER — HEPARIN SOD (PORK) LOCK FLUSH 100 UNIT/ML IV SOLN: 500.0000 [IU] | Freq: Once | INTRAVENOUS | Status: DC | PRN

## 2017-02-26 MED ORDER — METHYLPREDNISOLONE SODIUM SUCC 125 MG IJ SOLR: 125.0000 mg | Freq: Once | INTRAMUSCULAR | Status: DC | PRN

## 2017-02-26 MED ORDER — DIPHENHYDRAMINE HCL 50 MG/ML IJ SOLN: 50.0000 mg | Freq: Once | INTRAMUSCULAR | Status: DC | PRN

## 2017-02-26 MED ORDER — SODIUM CHLORIDE 0.9% FLUSH: 10.0000 mL | INTRAVENOUS | Status: DC | PRN

## 2017-02-26 MED ORDER — ALTEPLASE 2 MG IJ SOLR
2.0000 mg | Freq: Once | INTRAMUSCULAR | Status: DC | PRN
  Filled 2017-02-26: qty 2

## 2017-02-26 MED ORDER — SODIUM CHLORIDE 0.9 % IV SOLN: Freq: Once | INTRAVENOUS | Status: DC | PRN

## 2017-02-26 MED ORDER — FAMOTIDINE IN NACL 20-0.9 MG/50ML-% IV SOLN
20.0000 mg | Freq: Once | INTRAVENOUS | Status: DC | PRN
  Filled 2017-02-26: qty 50

## 2017-02-26 MED ORDER — DIPHENHYDRAMINE HCL 50 MG/ML IJ SOLN: 25.0000 mg | Freq: Once | INTRAMUSCULAR | Status: DC | PRN

## 2017-02-26 MED ORDER — SODIUM CHLORIDE 0.9 % IV SOLN: Freq: Once | INTRAVENOUS | Status: DC

## 2017-02-26 MED ORDER — ALBUTEROL SULFATE (2.5 MG/3ML) 0.083% IN NEBU: 2.5000 mg | INHALATION_SOLUTION | Freq: Once | RESPIRATORY_TRACT | Status: DC | PRN

## 2017-02-26 NOTE — Progress Notes (Signed)
Chemo dosages and dilutions verified by 2 RNs.  

## 2017-02-26 NOTE — Progress Notes (Addendum)
Attempted to draw blood from left chest PORT. 2 NACL flushed with push pull method ans position changes. The IV team has been notified    1435 the iv team returned to see if the ATP had worked  to give a blood return from the port. The IV  RN re eassessed the port. She felt the huber needle   hit the back of the port. .Still no blood return . Dr Alvy Bimler relayed to me to not hold the chemo even tho she ordered  IR to assess the port./ I have spoken with Drue Dun RN  our manager and Tammy nurse with Dr Alvy Bimler. Chemo will be held until a dye study has been done

## 2017-02-26 NOTE — Progress Notes (Signed)
Katrina [name]   DOB:20-May-1950   XT#:[id number]    Subjective: She is feeling well.  She complained of mild fatigue but denies dizziness or shortness of breath.The patient denies any recent signs or symptoms of bleeding such as spontaneous epistaxis, hematuria or hematochezia. She denies nausea.  She has regular bowel movement while hospitalized.  Objective:  Vitals:   02/25/17 2045 02/26/17 0559  BP: (!) 149/73 (!) 151/79  Pulse: 91 91  Resp: 20 20  Temp: 97.7 F (36.5 C) 97.8 F (36.6 C)  SpO2: 95% 98%     Intake/Output Summary (Last 24 hours) at 02/26/2017 1017 Last data filed at 02/26/2017 0400 Gross per 24 hour  Intake 938.5 ml  Output -  Net 938.5 ml    GENERAL:alert, no distress and comfortable SKIN: skin color, texture, turgor are normal, no rashes or significant lesions EYES: normal, Conjunctiva are pink and non-injected, sclera clear OROPHARYNX:no exudate, no erythema and lips, buccal mucosa, and tongue normal  NECK: supple, thyroid normal size, non-tender, without nodularity LYMPH:  no palpable lymphadenopathy in the cervical, axillary or inguinal LUNGS: clear to auscultation and percussion with normal breathing effort HEART: regular rate & rhythm and no murmurs and no lower extremity edema ABDOMEN:abdomen soft, non-tender and normal bowel sounds Musculoskeletal:no cyanosis of digits and no clubbing  NEURO: alert & oriented x 3 with fluent speech, no focal motor/sensory deficits   Labs:  Lab Results  Component Value Date   WBC 8.6 02/26/2017   HGB 8.1 (L) 02/26/2017   HCT 24.3 (L) 02/26/2017   MCV 87.7 02/26/2017   PLT 331 02/26/2017   NEUTROABS 7.4 02/26/2017    Lab Results  Component Value Date   NA 135 02/25/2017   K 3.1 (L) 02/25/2017   CL 101 02/25/2017   CO2 24 02/25/2017    Assessment & Plan:   Diffuse large B cell lymphoma ([place]) She has side effects from treatment including constipation and then recently diarrhea, fatigue and severe  anemia Overall, her symptoms aretolerable She is admitted for cycle3ofR-EPOCHchemotherapy with dose adjustment due to her side effects. She will receive rituximab on day 3 and IT chemotherapy on day 4 She will return on1/14/19for G-CSF support I plan to repeat imaging study after3cycles of chemotherapy  Anemia due to antineoplastic chemotherapy She has received blood transfusion on January 31, 2017.  Repeat hemoglobin today is 8.1.  She does not need blood transfusion.  I will discontinue IV fluids in case this could be due to hemodilution  Severe nausea and constipation, resolved Due to side effects of chemotherapy and constipation. I would continue aggressive laxative therapy  Hypokalemia Could be due to hemodilution Repeat labs are pending, will replace prn  CODE STATUS Full code  DVT prophylaxis Will hold Lovenox in anticipation for IT chemotherapy on February 27, 2017.  Physical weakness secondary to thoracic surgery I will consult physical therapy.  Discharge planning At the end of the week    Heath Lark, MD 02/26/2017  10:17 AM

## 2017-02-27 ENCOUNTER — Inpatient Hospital Stay (HOSPITAL_COMMUNITY): Payer: Medicare Other

## 2017-02-27 DIAGNOSIS — R197 Diarrhea, unspecified: Secondary | ICD-10-CM

## 2017-02-27 DIAGNOSIS — I10 Essential (primary) hypertension: Secondary | ICD-10-CM

## 2017-02-27 LAB — BASIC METABOLIC PANEL
Anion gap: 6 (ref 5–15)
BUN: 11 mg/dL (ref 6–20)
CHLORIDE: 106 mmol/L (ref 101–111)
CO2: 26 mmol/L (ref 22–32)
Calcium: 8.6 mg/dL — ABNORMAL LOW (ref 8.9–10.3)
Creatinine, Ser: 0.55 mg/dL (ref 0.44–1.00)
GFR calc Af Amer: >60 mL/min (ref >60)
GFR calc non Af Amer: >60 mL/min (ref >60)
Glucose, Bld: 120 mg/dL — ABNORMAL HIGH (ref 65–99)
POTASSIUM: 3.9 mmol/L (ref 3.5–5.1)
Sodium: 138 mmol/L (ref 135–145)

## 2017-02-27 LAB — CBC WITH DIFFERENTIAL/PLATELET
Basophils Absolute: 0 10*3/uL (ref 0.0–0.1)
Basophils Relative: 0 %
Eosinophils Absolute: 0 10*3/uL (ref 0.0–0.7)
Eosinophils Relative: 0 %
HEMATOCRIT: 24.3 % — AB (ref 36.0–46.0)
HEMOGLOBIN: 8.1 g/dL — AB (ref 12.0–15.0)
LYMPHS ABS: 0.4 10*3/uL — AB (ref 0.7–4.0)
LYMPHS PCT: 8 %
MCH: 29.1 pg (ref 26.0–34.0)
MCHC: 33.3 g/dL (ref 30.0–36.0)
MCV: 87.4 fL (ref 78.0–100.0)
MONOS PCT: 2 %
Monocytes Absolute: 0.1 10*3/uL (ref 0.1–1.0)
NEUTROS ABS: 4.7 10*3/uL (ref 1.7–7.7)
NEUTROS PCT: 90 %
Platelets: 332 10*3/uL (ref 150–400)
RBC: 2.78 MIL/uL — AB (ref 3.87–5.11)
RDW: 19.9 % — ABNORMAL HIGH (ref 11.5–15.5)
WBC: 5.2 10*3/uL (ref 4.0–10.5)

## 2017-02-27 MED ORDER — DIPHENHYDRAMINE HCL 50 MG PO CAPS: 50.0000 mg | ORAL_CAPSULE | Freq: Once | ORAL | Status: DC

## 2017-02-27 MED ORDER — VINCRISTINE SULFATE CHEMO INJECTION 1 MG/ML
Freq: Once | INTRAVENOUS | Status: AC
  Administered 2017-02-27: 18:00:00 via INTRAVENOUS
  Filled 2017-02-27 (×2): qty 9

## 2017-02-27 MED ORDER — PREDNISONE 50 MG PO TABS: 50.0000 mg | ORAL_TABLET | Freq: Four times a day (QID) | ORAL | Status: DC

## 2017-02-27 MED ORDER — SODIUM CHLORIDE 0.9 % IV SOLN
Freq: Once | INTRAVENOUS | Status: AC
  Administered 2017-02-27: 18 mg via INTRAVENOUS
  Filled 2017-02-27: qty 4

## 2017-02-27 MED ORDER — LIDOCAINE HCL 1 % IJ SOLN
INTRAMUSCULAR | Status: AC
  Administered 2017-02-27: 11:00:00
  Filled 2017-02-27: qty 20

## 2017-02-27 MED ORDER — SODIUM CHLORIDE 0.9 % IJ SOLN
Freq: Once | INTRAMUSCULAR | Status: AC
  Administered 2017-02-27: 12:00:00 via INTRATHECAL
  Filled 2017-02-27: qty 0.48

## 2017-02-27 NOTE — Care Management Important Message (Signed)
Important Message  Patient Details  Name: Katrina [name] MRN: [id number] Date of Birth: March 05, 1950   Medicare Important Message Given:  Yes    Kerin Salen 02/27/2017, 11:08 AMImportant Message  Patient Details  Name: Katrina [name] MRN: [id number] Date of Birth: [birth year]   Medicare Important Message Given:  Yes    Kerin Salen 02/27/2017, 11:08 AM

## 2017-02-27 NOTE — Progress Notes (Signed)
Katrina [name]   DOB:[birth year]   NW#:[id number]    Subjective: Her port malfunction has resolved.  She is doing well.  She had mild loose stool and did not take lactulose yesterday.  Objective:  Vitals:   02/26/17 2052 02/27/17 0600  BP: (!) 179/87 (!) 152/89  Pulse: 97 89  Resp: 20 17  Temp: 97.6 F (36.4 C) 97.7 F (36.5 C)  SpO2:  96%     Intake/Output Summary (Last 24 hours) at 02/27/2017 0846 Last data filed at 02/26/2017 1500 Gross per 24 hour  Intake 400 ml  Output -  Net 400 ml    GENERAL:alert, no distress and comfortable SKIN: skin color, texture, turgor are normal, no rashes or significant lesions EYES: normal, Conjunctiva are pink and non-injected, sclera clear OROPHARYNX:no exudate, no erythema and lips, buccal mucosa, and tongue normal  NECK: supple, thyroid normal size, non-tender, without nodularity LYMPH:  no palpable lymphadenopathy in the cervical, axillary or inguinal LUNGS: clear to auscultation and percussion with normal breathing effort HEART: regular rate & rhythm and no murmurs and no lower extremity edema ABDOMEN:abdomen soft, non-tender and normal bowel sounds Musculoskeletal:no cyanosis of digits and no clubbing  NEURO: alert & oriented x 3 with fluent speech, no focal motor/sensory deficits   Labs:  Lab Results  Component Value Date   WBC 5.2 02/27/2017   HGB 8.1 (L) 02/27/2017   HCT 24.3 (L) 02/27/2017   MCV 87.4 02/27/2017   PLT 332 02/27/2017   NEUTROABS 4.7 02/27/2017    Lab Results  Component Value Date   NA 138 02/27/2017   K 3.9 02/27/2017   CL 106 02/27/2017   CO2 26 02/27/2017    Assessment & Plan:  Diffuse large B cell lymphoma ([place]) She has side effects from treatment including constipation and then recently diarrhea, fatigue and severe anemia Overall, her symptoms aretolerable She is admittedforcycle3ofR-EPOCHchemotherapy with dose adjustment due to her side effects. She will receive rituximab on day 3 and IT  chemotherapy on day 4 She will return on1/14/19for G-CSF support I plan to repeat imaging study after3cycles of chemotherapy  Port malfunction, resolved  Anemia due to antineoplastic chemotherapy She has received blood transfusion on January 31, 2017. Repeat hemoglobin today is 8.1. She does not need blood transfusion. I will discontinue IV fluids in case this could be due to hemodilution  Severe nausea and constipation, resolved Dueto side effects of chemotherapy and constipation.  Hypokalemia Could be due to hemodilution Repeat labs are adequate  Elevated blood pressure Could be due to recent IV fluids Monitor closely  Mild diarrhea We will hold lactulose  CODE STATUS Full code  DVT prophylaxis Will hold Lovenoxin anticipation for IT chemotherapy on February 27, 2017.  Physical weakness secondary to thoracic surgery I will consult physical therapy.  Discharge planning At the end of the week   Heath Lark, MD 02/27/2017  8:46 AM

## 2017-02-27 NOTE — Progress Notes (Signed)
PT Cancellation Note  Patient Details Name: Katrina [name] MRN: [id number] DOB: May 27, 1950   Cancelled Treatment:    Reason Eval/Treat Not Completed: Medical issues which prohibited therapy(pt on bedrest until 16:00 2* spinal tap. Will check back tomorrow. )   Philomena Doheny 02/27/2017, 2:25 PM [phone]

## 2017-02-28 ENCOUNTER — Other Ambulatory Visit: Payer: Self-pay | Admitting: *Deleted

## 2017-02-28 ENCOUNTER — Other Ambulatory Visit: Payer: Self-pay | Admitting: Hematology and Oncology

## 2017-02-28 LAB — CBC WITH DIFFERENTIAL/PLATELET
Basophils Absolute: 0 10*3/uL (ref 0.0–0.1)
Basophils Relative: 0 %
EOS ABS: 0 10*3/uL (ref 0.0–0.7)
Eosinophils Relative: 0 %
HEMATOCRIT: 23.7 % — AB (ref 36.0–46.0)
HEMOGLOBIN: 8.1 g/dL — AB (ref 12.0–15.0)
LYMPHS PCT: 8 %
Lymphs Abs: 0.3 10*3/uL — ABNORMAL LOW (ref 0.7–4.0)
MCH: 29.1 pg (ref 26.0–34.0)
MCHC: 34.2 g/dL (ref 30.0–36.0)
MCV: 85.3 fL (ref 78.0–100.0)
Monocytes Absolute: 0 10*3/uL — ABNORMAL LOW (ref 0.1–1.0)
Monocytes Relative: 1 %
NEUTROS ABS: 3.1 10*3/uL (ref 1.7–7.7)
NEUTROS PCT: 91 %
Platelets: 301 10*3/uL (ref 150–400)
RBC: 2.78 MIL/uL — AB (ref 3.87–5.11)
RDW: 19.1 % — ABNORMAL HIGH (ref 11.5–15.5)
WBC: 3.4 10*3/uL — AB (ref 4.0–10.5)

## 2017-02-28 LAB — BASIC METABOLIC PANEL
Anion gap: 6 (ref 5–15)
BUN: 13 mg/dL (ref 6–20)
CHLORIDE: 104 mmol/L (ref 101–111)
CO2: 25 mmol/L (ref 22–32)
Calcium: 8.5 mg/dL — ABNORMAL LOW (ref 8.9–10.3)
Creatinine, Ser: 0.53 mg/dL (ref 0.44–1.00)
GFR calc Af Amer: >60 mL/min (ref >60)
GFR calc non Af Amer: >60 mL/min (ref >60)
Glucose, Bld: 129 mg/dL — ABNORMAL HIGH (ref 65–99)
POTASSIUM: 3.6 mmol/L (ref 3.5–5.1)
SODIUM: 135 mmol/L (ref 135–145)

## 2017-02-28 MED ORDER — TRAMADOL HCL 50 MG PO TABS: 100.0000 mg | ORAL_TABLET | Freq: Four times a day (QID) | ORAL | 0 refills | Status: DC | PRN

## 2017-02-28 MED ORDER — CYCLOPHOSPHAMIDE CHEMO INJECTION 1 GM
600.0000 mg/m2 | Freq: Once | INTRAMUSCULAR | Status: AC
  Administered 2017-02-28: 1280 mg via INTRAVENOUS
  Filled 2017-02-28: qty 64

## 2017-02-28 MED ORDER — SODIUM CHLORIDE 0.9 % IV SOLN
Freq: Once | INTRAVENOUS | Status: AC
  Administered 2017-02-28: 36 mg via INTRAVENOUS
  Filled 2017-02-28: qty 8

## 2017-02-28 NOTE — Discharge Summary (Signed)
Physician Discharge Summary  Patient ID: Katrina [name] MRN: [id number] DOB/AGE: Jul 11, 1950 67 y.o.  Admit date: 02/24/2017 Discharge date: 02/28/2017  Primary Care Physician:  Patient, No Pcp Per   Discharge Diagnoses:    Present on Admission: . Diffuse large B cell lymphoma ([place]) . Constipation . Numbness and tingling of both lower extremities   Discharge Medications:  Allergies as of 02/28/2017      Reactions   Contrast Media [iodinated Diagnostic Agents] Itching      Medication List    TAKE these medications   acyclovir 400 MG tablet Commonly known as:  ZOVIRAX Take 1 tablet (400 mg total) by mouth 2 (two) times daily.   amLODipine 10 MG tablet Commonly known as:  NORVASC Take 1 tablet (10 mg total) by mouth daily.   bisacodyl 5 MG EC tablet Commonly known as:  DULCOLAX Take 5 mg by mouth daily as needed for moderate constipation.   cyclobenzaprine 10 MG tablet Commonly known as:  FLEXERIL Take 1 tablet (10 mg total) by mouth 3 (three) times daily as needed for muscle spasms. What changed:  when to take this   Lactulose 20 GM/30ML Soln Take 30 mLs (20 g total) by mouth 3 (three) times daily.   lidocaine-prilocaine cream Commonly known as:  EMLA Apply 1 application topically as needed. What changed:  reasons to take this   morphine 15 MG 12 hr tablet Commonly known as:  MS CONTIN Take 1 tablet (15 mg total) by mouth every 12 (twelve) hours. What changed:    when to take this  reasons to take this   ondansetron 8 MG tablet Commonly known as:  ZOFRAN Take 1 tablet (8 mg total) by mouth every 8 (eight) hours as needed for nausea (not responsive to prochlorperazine (COMPAZINE)).   polyethylene glycol packet Commonly known as:  MIRALAX / GLYCOLAX Take 17 g by mouth daily as needed for mild constipation.   prochlorperazine 10 MG tablet Commonly known as:  COMPAZINE Take 1 tablet (10 mg total) by mouth every 6 (six) hours as needed for nausea  or vomiting.   traMADol 50 MG tablet Commonly known as:  ULTRAM Take 2 tablets (100 mg total) by mouth every 6 (six) hours as needed for severe pain. What changed:    how much to take  when to take this  reasons to take this       Disposition and Follow-up:   Significant Diagnostic Studies:  Dg Fluoro Guided Needle Plc Aspiration/injection Loc  Result Date: 02/27/2017 CLINICAL DATA:  Diffuse large B-cell lymphoma EXAM: FLUOROSCOPICALLY GUIDED LUMBAR PUNCTURE FOR INTRATHECAL CHEMOTHERAPY TECHNIQUE: Informed consent was obtained from the patient prior to the procedure, including potential complications of headache, allergy, and pain. A 'time out' was performed. With the patient prone, the lower back was prepped with Betadine. 1% Lidocaine was used for local anesthesia. Lumbar puncture was performed at the L3-4 using a 20 gauge needle with return of clear CSF. 12 milligrams in a total of 5 mm volume of methotrexate was injected into the subarachnoid space. The patient tolerated the procedure well without apparent complication. FLUOROSCOPY TIME:  19 sec IMPRESSION: Intrathecal injection of chemotherapy without complication Electronically Signed   By: Rolm Baptise M.D.   On: 02/27/2017 12:05   Dg Fluoro Guided Needle Plc Aspiration/injection Loc  Result Date: 02/06/2017 CLINICAL DATA:  Large B-cell lymphoma EXAM: LUMBAR PUNCTURE UNDER FLUOROSCOPIC GUIDANCE, INTRATHECAL METHOTREXATE ADMINISTRATION FLUOROSCOPY TIME:  Fluoroscopy Time:  0 MINUTES, 32 SECONDS Radiation  Exposure Index (if provided by the fluoroscopic device): 8.4 mGy Number of Acquired Spot Images: 0 MEDICATIONS: 12 MG METHOTREXATE ADMINISTERED INTRATHECALLY PROCEDURE: I discussed the risks (including hemorrhage, infection, headache, and nerve damage, among others), benefits, and alternatives to fluoroscopically guided lumbar puncture and intrathecal administration of methotrexate with the patient. We specifically discussed the high  technical likelihood of success of the procedure. The patient understood and elected to undergo the procedure. Standard time-out was employed. Following sterile skin prep and local anesthetic administration consisting of 1 percent lidocaine, a 22 gauge spinal needle was advanced without difficulty into the thecal sac at the at the L3-4 level. Clear CSF was returned. Opening pressure was not requested or performed. 12 mg of methotrexate in 5 mL was slowly administered intrathecally. The needle was subsequently removed and the skin cleansed and bandaged. No immediate complications were observed. IMPRESSION: 1. Successful intrathecal administration of methotrexate. Electronically Signed   By: Van Clines M.D.   On: 02/06/2017 14:17    Discharge Laboratory Values: Lab Results  Component Value Date   WBC 3.4 (L) 02/28/2017   HGB 8.1 (L) 02/28/2017   HCT 23.7 (L) 02/28/2017   MCV 85.3 02/28/2017   PLT 301 02/28/2017   Lab Results  Component Value Date   NA 135 02/28/2017   K 3.6 02/28/2017   CL 104 02/28/2017   CO2 25 02/28/2017    Brief H and P: For complete details please refer to admission H and P, but in brief, she was admitted to the hospital on February 24, 2017 for cycle 3 of chemotherapy.  Physical Exam at Discharge: BP 136/87 (BP Location: Left Arm)   Pulse 88   Temp 98 F (36.7 C) (Oral)   Resp 19   Ht 5\' 8"  (1.727 m)   Wt 208 lb 8 oz (94.6 kg)   SpO2 95%   BMI 31.70 kg/m  GENERAL:alert, no distress and comfortable SKIN: skin color, texture, turgor are normal, no rashes or significant lesions EYES: normal, Conjunctiva are pink and non-injected, sclera clear OROPHARYNX:no exudate, no erythema and lips, buccal mucosa, and tongue normal  NECK: supple, thyroid normal size, non-tender, without nodularity LYMPH:  no palpable lymphadenopathy in the cervical, axillary or inguinal LUNGS: clear to auscultation and percussion with normal breathing effort HEART: regular rate &  rhythm and no murmurs and no lower extremity edema ABDOMEN:abdomen soft, non-tender and normal bowel sounds Musculoskeletal:no cyanosis of digits and no clubbing  NEURO: alert & oriented x 3 with fluent speech, no focal motor/sensory deficits  Hospital Course:  Active Problems:   Diffuse large B cell lymphoma ([facility])   Constipation   S/P lumbar laminectomy   Numbness and tingling of both lower extremities  Diffuse large B cell lymphoma ([facility]) She has side effects from treatment including constipation and then recently diarrhea, fatigue and severe anemia Overall, her symptoms aretolerable She is admittedforcycle3ofR-EPOCHchemotherapy with dose adjustment due to her side effects.She received rituximab on day 3 and IT chemotherapy on day 4 She will return on1/14/19for G-CSF support I plan to repeat imaging study after3cycles of chemotherapy  Port malfunction, resolved  Anemia due to antineoplastic chemotherapy She has received blood transfusion on January 31, 2017. Repeat hemoglobin today is 8.1. She does not need blood transfusion.   Severe nausea and constipation, resolved Dueto side effects of chemotherapy and constipation.  Hypokalemia, resolved  Elevated blood pressure Could be due to recent IV fluids Monitor closely  CODE STATUS Full code  Physical weakness secondary to thoracic  surgery The patient will need to resume home physical therapy upon discharge due to persistent weakness after her thoracic surgery.  We will inform home care team to resume therapy upon discharge.  Diet:  Regular  Activity:  As tolerated  Condition at Discharge:   stable  Signed: Dr. Heath Lark [phone]  02/28/2017, 7:56 AM

## 2017-02-28 NOTE — Progress Notes (Signed)
Physical Therapy Treatment Patient Details Name: Katrina [name] MRN: [id number] DOB: May 26, 1950 Today's Date: 02/28/2017    History of Present Illness Pt with dx of diffuse Large B-cell Lymphoma and admitted for 5 day chemo cycle.      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 Restrictions Weight Bearing Restrictions: No    Mobility  Bed Mobility Overal bed mobility: Modified Independent                Transfers Overall transfer level: Needs assistance Equipment used: Rolling walker (2 wheeled) Transfers: Sit to/from Stand Sit to Stand: Supervision;Min guard         General transfer comment: 2 attempts to rise, pt uses momentum to stand  Ambulation/Gait Ambulation/Gait assistance: Min assist Ambulation Distance (Feet): 10 Feet Assistive device: Rolling walker (2 wheeled) Gait Pattern/deviations: Step-through pattern;Decreased stride length;Trunk flexed;Wide base of support     General Gait Details: pt unsteady, and extremely anxious reagarding amb into hallway; pt states her Left foot is more numb; chair to pt, encouarged slow,  controlled breathing   Stairs            Wheelchair Mobility    Modified Rankin (Stroke Patients Only)       Balance                                            Cognition Arousal/Alertness: Awake/alert Behavior During Therapy: WFL for tasks assessed/performed Overall Cognitive Status: Within Functional Limits for tasks assessed                                         Exercises      General Comments        Pertinent Vitals/Pain Pain Assessment: No/denies pain    Home Living                      Prior Function            PT Goals (current goals can now be found in the care plan section) Acute Rehab PT Goals Patient Stated Goal: get home and continue to feel stronger PT Goal Formulation: With patient Time For Goal Achievement: 03/11/17 Potential to Achieve Goals: Good Progress towards PT goals: Progressing toward goals    Frequency    Min 3X/week      PT Plan Current plan remains appropriate    Co-evaluation              AM-PAC PT "6 Clicks" Daily Activity  Outcome Measure  Difficulty turning over in bed (including adjusting bedclothes, sheets and blankets)?: A Little Difficulty moving from lying on back to sitting on the side of the bed? : A Little Difficulty sitting down on and standing up from a chair with arms (e.g., wheelchair, bedside commode, etc,.)?: A Little Help needed moving to and  from a bed to chair (including a wheelchair)?: A Little Help needed walking in hospital room?: A Little Help needed climbing 3-5 steps with a railing? : A Lot 6 Click Score: 17    End of Session Equipment Utilized During Treatment: Gait belt Activity Tolerance: Other (comment)(anxiety) Patient left: in chair;with call bell/phone within reach   PT Visit Diagnosis: Unsteadiness on feet (R26.81);Muscle weakness (generalized) (M62.81);Ataxic gait (R26.0)     Time: 6286-3817 PT Time Calculation (min) (ACUTE ONLY): 10 min  Charges:  $Gait Training: 8-22 mins                    G CodesKenyon Ana, PT Pager: [phone] 02/28/2017    [facility] 02/28/2017, 12:51 PM

## 2017-03-03 ENCOUNTER — Telehealth: Payer: Self-pay

## 2017-03-03 ENCOUNTER — Inpatient Hospital Stay: Payer: Medicare Other

## 2017-03-03 ENCOUNTER — Inpatient Hospital Stay: Payer: Medicare Other | Attending: Hematology and Oncology

## 2017-03-03 VITALS — BP 145/90 | HR 100 | Temp 97.6°F | Resp 20

## 2017-03-03 DIAGNOSIS — R202 Paresthesia of skin: Secondary | ICD-10-CM | POA: Diagnosis not present

## 2017-03-03 DIAGNOSIS — R200 Anesthesia of skin: Secondary | ICD-10-CM | POA: Diagnosis not present

## 2017-03-03 DIAGNOSIS — C8330 Diffuse large B-cell lymphoma, unspecified site: Secondary | ICD-10-CM | POA: Diagnosis present

## 2017-03-03 DIAGNOSIS — D6481 Anemia due to antineoplastic chemotherapy: Secondary | ICD-10-CM | POA: Insufficient documentation

## 2017-03-03 DIAGNOSIS — T451X5A Adverse effect of antineoplastic and immunosuppressive drugs, initial encounter: Secondary | ICD-10-CM | POA: Diagnosis not present

## 2017-03-03 DIAGNOSIS — Z5189 Encounter for other specified aftercare: Secondary | ICD-10-CM | POA: Insufficient documentation

## 2017-03-03 DIAGNOSIS — L89611 Pressure ulcer of right heel, stage 1: Secondary | ICD-10-CM | POA: Diagnosis not present

## 2017-03-03 LAB — COMPREHENSIVE METABOLIC PANEL
ALBUMIN: 3.6 g/dL (ref 3.5–5.0)
ALK PHOS: 55 U/L (ref 40–150)
ALT: 45 U/L (ref 0–55)
AST: 37 U/L — AB (ref 5–34)
Anion gap: 15 — ABNORMAL HIGH (ref 3–11)
BILIRUBIN TOTAL: 0.5 mg/dL (ref 0.2–1.2)
BUN: 13 mg/dL (ref 7–26)
CALCIUM: 9.1 mg/dL (ref 8.4–10.4)
CO2: 23 mmol/L (ref 22–29)
Chloride: 98 mmol/L (ref 98–109)
Creatinine, Ser: 0.76 mg/dL (ref 0.60–1.10)
GFR calc Af Amer: >60 mL/min (ref >60)
GFR calc non Af Amer: >60 mL/min (ref >60)
GLUCOSE: 116 mg/dL (ref 70–140)
Potassium: 3 mmol/L — CL (ref 3.3–4.7)
SODIUM: 136 mmol/L (ref 136–145)
Total Protein: 6.2 g/dL — ABNORMAL LOW (ref 6.4–8.3)

## 2017-03-03 LAB — CBC WITH DIFFERENTIAL/PLATELET
BASOS ABS: 0 10*3/uL (ref 0.0–0.1)
BASOS PCT: 1 %
Eosinophils Absolute: 0 10*3/uL (ref 0.0–0.5)
Eosinophils Relative: 1 %
HEMATOCRIT: 27.6 % — AB (ref 34.8–46.6)
HEMOGLOBIN: 9.3 g/dL — AB (ref 11.6–15.9)
Lymphocytes Relative: 10 %
Lymphs Abs: 0.5 10*3/uL — ABNORMAL LOW (ref 0.9–3.3)
MCH: 28.9 pg (ref 25.1–34.0)
MCHC: 33.9 g/dL (ref 31.5–36.0)
MCV: 85.3 fL (ref 79.5–101.0)
Monocytes Absolute: 0 10*3/uL — ABNORMAL LOW (ref 0.1–0.9)
Monocytes Relative: 1 %
NEUTROS ABS: 4 10*3/uL (ref 1.5–6.5)
NEUTROS PCT: 87 %
Platelets: 294 10*3/uL (ref 145–400)
RBC: 3.23 MIL/uL — AB (ref 3.70–5.45)
RDW: 20.4 % — ABNORMAL HIGH (ref 11.2–16.1)
WBC: 4.5 10*3/uL (ref 3.9–10.3)

## 2017-03-03 LAB — SAMPLE TO BLOOD BANK

## 2017-03-03 MED ORDER — PEGFILGRASTIM INJECTION 6 MG/0.6ML ~~LOC~~
6.0000 mg | PREFILLED_SYRINGE | Freq: Once | SUBCUTANEOUS | Status: AC
  Administered 2017-03-03: 6 mg via SUBCUTANEOUS

## 2017-03-03 MED ORDER — PEGFILGRASTIM INJECTION 6 MG/0.6ML ~~LOC~~
PREFILLED_SYRINGE | SUBCUTANEOUS | Status: AC
  Filled 2017-03-03: qty 0.6

## 2017-03-03 NOTE — Telephone Encounter (Signed)
-----   Message from Heath Lark, MD sent at 03/03/2017 11:02 AM EST ----- Regarding: low potassium Recommend potassium rich diet only ----- Message ----- From: Interface, Lab In [place] Sent: 03/03/2017  10:03 AM To: Heath Lark, MD

## 2017-03-03 NOTE — Progress Notes (Signed)
Pt stated that she is able to have a bowel movement every other day if not every  day

## 2017-03-03 NOTE — Telephone Encounter (Signed)
Called with below message. Instructed to call for questions. 

## 2017-03-03 NOTE — Patient Instructions (Signed)
Pegfilgrastim injection What is this medicine? PEGFILGRASTIM (PEG fil gra stim) is a long-acting granulocyte colony-stimulating factor that stimulates the growth of neutrophils, a type of white blood cell important in the body's fight against infection. It is used to reduce the incidence of fever and infection in patients with certain types of cancer who are receiving chemotherapy that affects the bone marrow, and to increase survival after being exposed to high doses of radiation. This medicine may be used for other purposes; ask your health care provider or pharmacist if you have questions. COMMON BRAND NAME(S): Neulasta What should I tell my health care provider before I take this medicine? They need to know if you have any of these conditions: -kidney disease -latex allergy -ongoing radiation therapy -sickle cell disease -skin reactions to acrylic adhesives (On-Body Injector only) -an unusual or allergic reaction to pegfilgrastim, filgrastim, other medicines, foods, dyes, or preservatives -pregnant or trying to get pregnant -breast-feeding How should I use this medicine? This medicine is for injection under the skin. If you get this medicine at home, you will be taught how to prepare and give the pre-filled syringe or how to use the On-body Injector. Refer to the patient Instructions for Use for detailed instructions. Use exactly as directed. Tell your healthcare provider immediately if you suspect that the On-body Injector may not have performed as intended or if you suspect the use of the On-body Injector resulted in a missed or partial dose. It is important that you put your used needles and syringes in a special sharps container. Do not put them in a trash can. If you do not have a sharps container, call your pharmacist or healthcare provider to get one. Talk to your pediatrician regarding the use of this medicine in children. While this drug may be prescribed for selected conditions,  precautions do apply. Overdosage: If you think you have taken too much of this medicine contact a poison control center or emergency room at once. NOTE: This medicine is only for you. Do not share this medicine with others. What if I miss a dose? It is important not to miss your dose. Call your doctor or health care professional if you miss your dose. If you miss a dose due to an On-body Injector failure or leakage, a new dose should be administered as soon as possible using a single prefilled syringe for manual use. What may interact with this medicine? Interactions have not been studied. Give your health care provider a list of all the medicines, herbs, non-prescription drugs, or dietary supplements you use. Also tell them if you smoke, drink alcohol, or use illegal drugs. Some items may interact with your medicine. This list may not describe all possible interactions. Give your health care provider a list of all the medicines, herbs, non-prescription drugs, or dietary supplements you use. Also tell them if you smoke, drink alcohol, or use illegal drugs. Some items may interact with your medicine. What should I watch for while using this medicine? You may need blood work done while you are taking this medicine. If you are going to need a MRI, CT scan, or other procedure, tell your doctor that you are using this medicine (On-Body Injector only). What side effects may I notice from receiving this medicine? Side effects that you should report to your doctor or health care professional as soon as possible: -allergic reactions like skin rash, itching or hives, swelling of the face, lips, or tongue -dizziness -fever -pain, redness, or irritation at site   where injected -pinpoint red spots on the skin -red or dark-brown urine -shortness of breath or breathing problems -stomach or side pain, or pain at the shoulder -swelling -tiredness -trouble passing urine or change in the amount of urine Side  effects that usually do not require medical attention (report to your doctor or health care professional if they continue or are bothersome): -bone pain -muscle pain This list may not describe all possible side effects. Call your doctor for medical advice about side effects. You may report side effects to FDA at 1-800-FDA-1088. Where should I keep my medicine? Keep out of the reach of children. Store pre-filled syringes in a refrigerator between 2 and 8 degrees C (36 and 46 degrees F). Do not freeze. Keep in carton to protect from light. Throw away this medicine if it is left out of the refrigerator for more than 48 hours. Throw away any unused medicine after the expiration date. NOTE: This sheet is a summary. It may not cover all possible information. If you have questions about this medicine, talk to your doctor, pharmacist, or health care provider.  2018 [employer]vier/Gold Standard (2016-02-01 12:58:03)  

## 2017-03-13 ENCOUNTER — Inpatient Hospital Stay (HOSPITAL_BASED_OUTPATIENT_CLINIC_OR_DEPARTMENT_OTHER): Payer: Medicare Other | Admitting: Hematology and Oncology

## 2017-03-13 ENCOUNTER — Inpatient Hospital Stay: Payer: Medicare Other

## 2017-03-13 ENCOUNTER — Encounter: Payer: Self-pay | Admitting: Hematology and Oncology

## 2017-03-13 ENCOUNTER — Ambulatory Visit (HOSPITAL_COMMUNITY)
Admission: RE | Admit: 2017-03-13 | Discharge: 2017-03-13 | Disposition: A | Discharge status: Home / Self Care | Payer: Medicare Other | Source: Ambulatory Visit | Attending: Hematology and Oncology | Admitting: Hematology and Oncology

## 2017-03-13 ENCOUNTER — Ambulatory Visit (HOSPITAL_BASED_OUTPATIENT_CLINIC_OR_DEPARTMENT_OTHER)
Admission: RE | Admit: 2017-03-13 | Discharge: 2017-03-13 | Disposition: A | Discharge status: Home / Self Care | Payer: Medicare Other | Source: Ambulatory Visit | Attending: Hematology and Oncology | Admitting: Hematology and Oncology

## 2017-03-13 ENCOUNTER — Other Ambulatory Visit: Payer: Self-pay | Admitting: Hematology and Oncology

## 2017-03-13 ENCOUNTER — Ambulatory Visit: Payer: Medicare Other

## 2017-03-13 DIAGNOSIS — R202 Paresthesia of skin: Secondary | ICD-10-CM

## 2017-03-13 DIAGNOSIS — C8330 Diffuse large B-cell lymphoma, unspecified site: Secondary | ICD-10-CM

## 2017-03-13 DIAGNOSIS — D6481 Anemia due to antineoplastic chemotherapy: Secondary | ICD-10-CM

## 2017-03-13 DIAGNOSIS — L89611 Pressure ulcer of right heel, stage 1: Secondary | ICD-10-CM | POA: Diagnosis not present

## 2017-03-13 DIAGNOSIS — T451X5A Adverse effect of antineoplastic and immunosuppressive drugs, initial encounter: Secondary | ICD-10-CM

## 2017-03-13 DIAGNOSIS — R200 Anesthesia of skin: Secondary | ICD-10-CM | POA: Diagnosis not present

## 2017-03-13 LAB — CBC WITH DIFFERENTIAL/PLATELET
BASOS PCT: 0 %
Basophils Absolute: 0 10*3/uL (ref 0.0–0.1)
EOS ABS: 0 10*3/uL (ref 0.0–0.5)
Eosinophils Relative: 0 %
HEMATOCRIT: 27.6 % — AB (ref 34.8–46.6)
HEMOGLOBIN: 8.8 g/dL — AB (ref 11.6–15.9)
LYMPHS ABS: 0.8 10*3/uL — AB (ref 0.9–3.3)
Lymphocytes Relative: 13 %
MCH: 28.7 pg (ref 25.1–34.0)
MCHC: 31.9 g/dL (ref 31.5–36.0)
MCV: 89.9 fL (ref 79.5–101.0)
MONO ABS: 0.7 10*3/uL (ref 0.1–0.9)
MONOS PCT: 10 %
NEUTROS ABS: 5.2 10*3/uL (ref 1.5–6.5)
NEUTROS PCT: 77 %
Platelets: 347 10*3/uL (ref 145–400)
RBC: 3.07 MIL/uL — ABNORMAL LOW (ref 3.70–5.45)
RDW: 19.4 % — AB (ref 11.2–16.1)
WBC: 6.7 10*3/uL (ref 3.9–10.3)

## 2017-03-13 LAB — COMPREHENSIVE METABOLIC PANEL
ALBUMIN: 3.5 g/dL (ref 3.5–5.0)
ALK PHOS: 78 U/L (ref 40–150)
ALT: 11 U/L (ref 0–55)
AST: 15 U/L (ref 5–34)
Anion gap: 13 — ABNORMAL HIGH (ref 3–11)
BUN: 9 mg/dL (ref 7–26)
CALCIUM: 8.9 mg/dL (ref 8.4–10.4)
CHLORIDE: 95 mmol/L — AB (ref 98–109)
CO2: 28 mmol/L (ref 22–29)
CREATININE: 1.15 mg/dL — AB (ref 0.60–1.10)
GFR calc Af Amer: 57 mL/min — ABNORMAL LOW (ref >60)
GFR calc non Af Amer: 49 mL/min — ABNORMAL LOW (ref >60)
GLUCOSE: 151 mg/dL — AB (ref 70–140)
Potassium: 3.4 mmol/L (ref 3.3–4.7)
SODIUM: 136 mmol/L (ref 136–145)
Total Bilirubin: 0.5 mg/dL (ref 0.2–1.2)
Total Protein: 6.1 g/dL — ABNORMAL LOW (ref 6.4–8.3)

## 2017-03-13 LAB — ECHOCARDIOGRAM COMPLETE: Height: 68 in

## 2017-03-13 LAB — GLUCOSE, CAPILLARY: GLUCOSE-CAPILLARY: 119 mg/dL — AB (ref 65–99)

## 2017-03-13 LAB — SAMPLE TO BLOOD BANK

## 2017-03-13 MED ORDER — FLUDEOXYGLUCOSE F - 18 (FDG) INJECTION
10.1000 | Freq: Once | INTRAVENOUS | Status: AC | PRN
  Administered 2017-03-13: 10.1 via INTRAVENOUS

## 2017-03-13 NOTE — Progress Notes (Signed)
[facility] OFFICE PROGRESS NOTE  Patient Care Team: Patient, No Pcp Per as PCP - General (General Practice)  SUMMARY OF ONCOLOGIC HISTORY:   Diffuse large B cell lymphoma ([place])   12/12/2016 Imaging    CT scan showed  1. Extensive mesenteric, retroperitoneal, left hilar, mediastinal, left supraclavicular and left axillary adenopathy. Differential considerations include metastatic adenopathy, lymphoma and leukemia. The size of the nodes in the mesentery and retroperitoneum are suggestive of non-Hodgkin's lymphoma. 2. 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 Differential considerations include metastatic disease and lymphoma. A left upper lobe primary lung carcinoma is a possibility. 3. 3.2 cm solid right renal mass. Differential considerations include renal cell carcinoma and oncocytoma. 4. Small amount of free peritoneal fluid, most likely due to lymphatic obstruction by the mesenteric adenopathy. 5. Left fourth rib fracture. 6. Colonic diverticulosis. 7. Calcific coronary artery and aortic atherosclerosis. Aortic Atherosclerosis (ICD10-I70.0).       12/12/2016 - 12/14/2016 Hospital Admission    He was briefly admitted and evaluated for weakness.  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 Tumor fills the right neural foramina at T6-7 and T7-8 and completely replaces the T7 vertebral body.      12/18/2016 Surgery    She underwent procedure: Decompressive thoracic laminectomy from T5-T8 for resection of epidural tumor        12/18/2016 Pathology Results    1. Soft tissue mass, simple excision, thoracic dorsal  epidural mass - DIFFUSE LARGE B-CELL LYMPHOMA. - SEE ONCOLOGY TABLE. 2. Soft tissue mass, simple excision, thoracic dorsal epidural mass - DIFFUSE LARGE B-CELL LYMPHOMA. Microscopic Comment 1. LYMPHOMA Histologic type: Non-Hodgkin B-cell lymphoma: Diffuse large B-cell lymphoma. Grade (if applicable): High grade. Flow cytometry: N/A. Immunohistochemical stains: CD20. CD3, CD5, CD10, CD23, CD30, CD15, PAX-5, Ki-67, bcl-2, bcl-6, CD34. Touch preps/imprints: N/A. Comments: There is a diffuse infiltrate of atypical lymphocytes. The lymphocytes are medium to large in size with irregular nuclear contours. There are background smaller lymphocytes. While there are admixed smaller cells, there are diffuse areas of large cells. The large lymphocytes are positive for CD20, CD10, bcl-6, and bcl-2. CD3 and CD5 highlight admixed T-cells. CD34, CD30, CD23, and CD15 are negative. PAX-5 is positive. Ki-67 is variable with areas up to 70%. 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       12/18/2016 - 01/17/2017 Hospital Admission    The patient was readmitted to the hospital due to complete weakness secondary to spinal cord compression.  She underwent surgery and pathology report confirmed diagnosis of diffuse large B-cell lymphoma. She received IT chemo on 01/16/17      01/16/2017 Pathology Results    CEREBROSPINAL FLUID (SPECIMEN 1 OF 1 COLLECTED 01/16/17): NO MALIGNANT CELLS IDENTIFIED.      02/03/2017 - 02/07/2017 Hospital Admission    She is admitted for cycle 2 of chemo      02/24/2017 - 02/28/2017 Hospital Admission    She is admitted for cycle 3 of chemotherapy       INTERVAL HISTORY:  Please see below for problem oriented charting. She returns with her son for further follow-up PET CT scan result is not available Since the  last time I saw her, she continues to feel weak She has a new pressure sore over the right heel which she is putting Neosporin cream and dry dressing changes It is stable and slowly healing Her appetite is stable, no recent weight loss She denies diarrhea or constipation She continues to have intermittent tightness and also generalized weakness She is receiving physical therapy at home She denies chest pain or shortness of breath She have mild bilateral lower extremity edema and swelling The patient denies any recent signs or symptoms of bleeding such as spontaneous epistaxis, hematuria or hematochezia. She has minimum peripheral neuropathy  REVIEW OF SYSTEMS:   Constitutional: Denies fevers, chills or abnormal weight loss Eyes: Denies blurriness of vision Ears, nose, mouth, throat, and face: Denies mucositis or sore throat Respiratory: Denies cough, dyspnea or wheezes Cardiovascular: Denies palpitation, chest discomfort  Gastrointestinal:  Denies nausea, heartburn or change in bowel habits Lymphatics: Denies new lymphadenopathy or easy bruising Behavioral/Psych: Mood is stable, no new changes  All other systems were reviewed with the patient and are negative.  I have reviewed the past medical history, past surgical history, social history and family history with the patient and they are unchanged from previous note.  ALLERGIES:  is allergic to contrast media [iodinated diagnostic agents].  MEDICATIONS:  Current Outpatient Medications  Medication Sig Dispense Refill  . acyclovir (ZOVIRAX) 400 MG tablet Take 1 tablet (400 mg total) by mouth 2 (two) times daily. 60 tablet 9  . amLODipine (NORVASC) 10 MG tablet Take 1 tablet (10 mg total) by mouth daily. 30 tablet 0  . bisacodyl (DULCOLAX) 5 MG EC tablet Take 5 mg by mouth daily as needed for moderate constipation.    . cyclobenzaprine (FLEXERIL) 10 MG tablet Take 1 tablet (10 mg total) by mouth 3 (three) times daily as needed for  muscle spasms. (Patient taking differently: Take 10 mg by mouth 2 (two) times daily. ) 90 tablet 1  . Lactulose 20 GM/30ML SOLN Take 30 mLs (20 g total) by mouth 3 (three) times daily. 473 mL 1  . lidocaine-prilocaine (EMLA) cream Apply 1 application topically as needed. (Patient taking differently: Apply 1 application topically as needed (access port). ) 30 g 6  . morphine (MS CONTIN) 15 MG 12 hr tablet Take 1 tablet (15 mg total) by mouth every 12 (twelve) hours. (Patient taking differently: Take 15 mg by mouth every 12 (twelve) hours as needed for pain. ) 60 tablet 0  . ondansetron (ZOFRAN) 8 MG tablet Take 1 tablet (8 mg total) by mouth every 8 (eight) hours as needed for nausea (not responsive to prochlorperazine (COMPAZINE)). 60 tablet 1  . polyethylene glycol (MIRALAX / GLYCOLAX) packet Take 17 g by mouth daily as needed for mild constipation.    . prochlorperazine (COMPAZINE) 10 MG tablet Take 1 tablet (10 mg total) by mouth every 6 (six) hours as needed for nausea or vomiting. 60 tablet 1  . traMADol (ULTRAM) 50 MG tablet Take 2 tablets (100 mg total) by mouth every 6 (six) hours as needed for severe pain. 90 tablet 0   No current facility-administered medications for this visit.     PHYSICAL EXAMINATION: ECOG PERFORMANCE STATUS: 2 - Symptomatic, <50% confined to bed  Vitals:   03/13/17 0944  BP: 121/68  Pulse: (!) 101  Resp: 18  Temp: 98.3 F (  36.8 C)  SpO2: 100%   Filed Weights    GENERAL:alert, no distress and comfortable.  She looks frail, sitting on the wheelchair SKIN:  noted grade 1 pressure sore over the right heel  EYES: normal, Conjunctiva are pink and non-injected, sclera clear OROPHARYNX:no exudate, no erythema and lips, buccal mucosa, and tongue normal  NECK: supple, thyroid normal size, non-tender, without nodularity LYMPH:  no palpable lymphadenopathy in the cervical, axillary or inguinal LUNGS: clear to auscultation and percussion with normal breathing  effort HEART: regular rate & rhythm and no murmurs with mild bilateral lower extremity edema ABDOMEN:abdomen soft, non-tender and normal bowel sounds Musculoskeletal:no cyanosis of digits and no clubbing  NEURO: alert & oriented x 3 with fluent speech, no focal motor/sensory deficits  LABORATORY DATA:  I have reviewed the data as listed    Component Value Date/Time   NA 136 03/13/2017 0849   NA 134 (L) 02/19/2017 0925   K 3.4 03/13/2017 0849   K 3.0 (LL) 02/19/2017 0925   CL 95 (L) 03/13/2017 0849   CO2 28 03/13/2017 0849   CO2 29 02/19/2017 0925   GLUCOSE 151 (H) 03/13/2017 0849   GLUCOSE 135 02/19/2017 0925   BUN 9 03/13/2017 0849   BUN 7.4 02/19/2017 0925   CREATININE 1.15 (H) 03/13/2017 0849   CREATININE 0.9 02/19/2017 0925   CALCIUM 8.9 03/13/2017 0849   CALCIUM 8.9 02/19/2017 0925   PROT 6.1 (L) 03/13/2017 0849   PROT 6.4 02/19/2017 0925   ALBUMIN 3.5 03/13/2017 0849   ALBUMIN 3.4 (L) 02/19/2017 0925   AST 15 03/13/2017 0849   AST 16 02/19/2017 0925   ALT 11 03/13/2017 0849   ALT 13 02/19/2017 0925   ALKPHOS 78 03/13/2017 0849   ALKPHOS 86 02/19/2017 0925   BILITOT 0.5 03/13/2017 0849   BILITOT 0.65 02/19/2017 0925   GFRNONAA 49 (L) 03/13/2017 0849   GFRAA 57 (L) 03/13/2017 0849    No results found for: SPEP, UPEP  Lab Results  Component Value Date   WBC 6.7 03/13/2017   NEUTROABS 5.2 03/13/2017   HGB 8.8 (L) 03/13/2017   HCT 27.6 (L) 03/13/2017   MCV 89.9 03/13/2017   PLT 347 03/13/2017      Chemistry      Component Value Date/Time   NA 136 03/13/2017 0849   NA 134 (L) 02/19/2017 0925   K 3.4 03/13/2017 0849   K 3.0 (LL) 02/19/2017 0925   CL 95 (L) 03/13/2017 0849   CO2 28 03/13/2017 0849   CO2 29 02/19/2017 0925   BUN 9 03/13/2017 0849   BUN 7.4 02/19/2017 0925   CREATININE 1.15 (H) 03/13/2017 0849   CREATININE 0.9 02/19/2017 0925      Component Value Date/Time   CALCIUM 8.9 03/13/2017 0849   CALCIUM 8.9 02/19/2017 0925   ALKPHOS 78  03/13/2017 0849   ALKPHOS 86 02/19/2017 0925   AST 15 03/13/2017 0849   AST 16 02/19/2017 0925   ALT 11 03/13/2017 0849   ALT 13 02/19/2017 0925   BILITOT 0.5 03/13/2017 0849   BILITOT 0.65 02/19/2017 0925       RADIOGRAPHIC STUDIES: I have reviewed imaging study with the patient and her son I have personally reviewed the radiological images as listed and agreed with the findings in the report. Nm Pet Image Initial (pi) Skull Base To Thigh  Result Date: 03/13/2017 CLINICAL DATA:  Subsequent treatment strategy for lymphoma. EXAM: NUCLEAR MEDICINE PET SKULL BASE TO THIGH TECHNIQUE: 10.1 mCi F-18  FDG was injected intravenously. Full-ring PET imaging was performed from the skull base to thigh after the radiotracer. CT data was obtained and used for attenuation correction and anatomic localization. FASTING BLOOD GLUCOSE:  Value: 119 mg/dl COMPARISON:  12/12/2016 FINDINGS: For internal reference: Blood pool activity within the aorta has an SUV max of 2.89. Activity within the liver has an SUV max of 3.51. NECK: No hypermetabolic lymph nodes in the neck. CHEST: Mild cardiac enlargement. Aortic atherosclerosis. Calcification in the LAD and left circumflex and RCA coronary arteries noted. Significantly diminished size of previously enlarged left internal mammary lymph node. SUV max currently 1.77, Deauville criteria 2. ABDOMEN/PELVIS: There is no abnormal uptake within the liver, pancreas, spleen or adrenal glands. Previously identified complex lesion arising from the posterior aspect of the interpolar right kidney measures 3.1 cm and 6 HU. Previously this measured 3.1 cm and 51 HU (postcontrast). Left retroperitoneal soft tissue nodularity measures 1.2 by 1.7 cm and has an SUV max equal to 2.88, Deauville criteria 2 compatible with treated tumor. Previously this measured 4.2 by 3.3. Soft tissue mass within the central mesenteric measures 4.4 x 2.8 cm and has an SUV max equal to 3.4, Deauville criteria 3  (compatible with treated tumor). Previously this measured 5.7 x 6.5 cm. Right lower quadrant soft tissue infiltration measures 4.1 by 1.6 cm and has an SUV max equal to 3.03, Deauville criteria 3 (compatible with treated tumor). Previously 8.0 x 3.7 cm. SKELETON: Mildly diffuse increased uptake throughout the bone marrow of the axial and appendicular skeleton is identified. Nonspecific. Left lateral chest wall mass involving the lateral aspect of the left fifth rib has nearly completely resolved with residual pathologic fracture of the rib. SUV max is equal to 3.1, Deauville criteria 3. The previously identified paraspinal soft tissue mass involving the eighth rib has significantly diminished in size in the interval. SUV max within this area is equal to 2.97,Deauville 3. IMPRESSION: 1. Low level FDG uptake associated with previous areas of soft tissue tumor within the chest and abdomen. Deauville criteria 2 and 3 compatible with treated tumor. No sites of residual metabolically active tumor (Deauville criteria 4 or 5) identified. 2. Mild diffuse uptake throughout the bone marrow is noted which is nonspecific in may reflect treatment related changes. 3. Stable size of previously noted complex lesion arising from the posterior cortex of the right kidney which now measures fluid attenuation. This is incompletely characterized on today's given lack of IV contrast material. 4. Aortic Atherosclerosis (ICD10-I70.0). Multi vessel coronary artery calcifications noted. Electronically Signed   By: Kerby Moors M.D.   On: 03/13/2017 10:22   Dg Fluoro Guided Needle Plc Aspiration/injection Loc  Result Date: 02/27/2017 CLINICAL DATA:  Diffuse large B-cell lymphoma EXAM: FLUOROSCOPICALLY GUIDED LUMBAR PUNCTURE FOR INTRATHECAL CHEMOTHERAPY TECHNIQUE: Informed consent was obtained from the patient prior to the procedure, including potential complications of headache, allergy, and pain. A 'time out' was performed. With the  patient prone, the lower back was prepped with Betadine. 1% Lidocaine was used for local anesthesia. Lumbar puncture was performed at the L3-4 using a 20 gauge needle with return of clear CSF. 12 milligrams in a total of 5 mm volume of methotrexate was injected into the subarachnoid space. The patient tolerated the procedure well without apparent complication. FLUOROSCOPY TIME:  19 sec IMPRESSION: Intrathecal injection of chemotherapy without complication Electronically Signed   By: Rolm Baptise M.D.   On: 02/27/2017 12:05    ASSESSMENT & PLAN:  Diffuse large B  cell lymphoma ([place]) I have reviewed the PET CT scan with the patient and her son Result is pending The patient has probably complete remission on treatment with near resolution of all areas of lymphadenopathy The left anterior rib area has also almost completely healed The patient is very frail and she has new pressure sore on the right heel Clinically, I do not believe she is ready to start cycle for chemotherapy next week I will order echocardiogram to monitor cardiac function I will consider bringing her back next week for further evaluation before admission for cycle 4 of treatment  Anemia due to antineoplastic chemotherapy She has fatigue but does not need blood transfusion We will monitor closely  Pressure injury of right heel, stage 1 She has pressure sore on the right heel I recommend conservative management only I place a clean bandage over  Numbness and tingling of both lower extremities She has generalized weakness since recent chemotherapy She would continue home PT   No orders of the defined types were placed in this encounter.  All questions were answered. The patient knows to call the clinic with any problems, questions or concerns. No barriers to learning was detected. I spent 40 minutes counseling the patient face to face. The total time spent in the appointment was 55 minutes and more than 50% was on counseling  and review of test results     Heath Lark, MD 03/13/2017 10:29 AM

## 2017-03-13 NOTE — Assessment & Plan Note (Signed)
She has pressure sore on the right heel I recommend conservative management only I place a clean bandage over

## 2017-03-13 NOTE — Assessment & Plan Note (Signed)
She has generalized weakness since recent chemotherapy She would continue home PT

## 2017-03-13 NOTE — Assessment & Plan Note (Signed)
I have reviewed the PET CT scan with the patient and her son Result is pending The patient has probably complete remission on treatment with near resolution of all areas of lymphadenopathy The left anterior rib area has also almost completely healed The patient is very frail and she has new pressure sore on the right heel Clinically, I do not believe she is ready to start cycle for chemotherapy next week I will order echocardiogram to monitor cardiac function I will consider bringing her back next week for further evaluation before admission for cycle 4 of treatment

## 2017-03-13 NOTE — Assessment & Plan Note (Signed)
She has fatigue but does not need blood transfusion We will monitor closely

## 2017-03-13 NOTE — Progress Notes (Signed)
  Echocardiogram 2D Echocardiogram has been performed.  Shalisa Mcquade R 03/13/2017, 12:23 PM 

## 2017-03-14 ENCOUNTER — Telehealth: Payer: Self-pay | Admitting: Hematology and Oncology

## 2017-03-14 ENCOUNTER — Telehealth: Payer: Self-pay

## 2017-03-14 NOTE — Telephone Encounter (Signed)
Spoke with Ronalee Belts from [facility] regarding extending pt physical therapy for another week. Wanting a verbal order from [place].   Fredirick Maudlin [facility], verbal order to continue PT for patient per Dr.Gorsuch last progress note.

## 2017-03-14 NOTE — Telephone Encounter (Signed)
No new orders or visits per 1/24 los.

## 2017-03-17 ENCOUNTER — Telehealth: Payer: Self-pay | Admitting: Hematology and Oncology

## 2017-03-17 ENCOUNTER — Telehealth: Payer: Self-pay | Admitting: *Deleted

## 2017-03-17 NOTE — Telephone Encounter (Signed)
-----   Message from Heath Lark, MD sent at 03/17/2017  9:26 AM EST ----- Regarding: RE: f/u  Melissa,  I will send scheduling msg when ready Rendy Lazard, can you call and ask how is the sore on her heel? I prefer to wait till it heals before restarting chemo ----- Message ----- From: Nicholaus Corolla Sent: 03/17/2017   9:14 AM To: Heath Lark, MD, Nicholaus Corolla Subject: f/u                                            Patient called in and wanted to schedule an appt for a f/u said she doesn't have anything order -   Prefers Friday morning due to transportation,  Please send sch message is appt is needed. -   Thanks (:

## 2017-03-17 NOTE — Telephone Encounter (Signed)
Katrina [name] states heel is scabbed over, no drainage, no redness around edges. It is not completely healed.  (Prefers Friday mornings due to transportation)

## 2017-03-17 NOTE — Telephone Encounter (Signed)
Spoke to patient regarding upcoming February appointments per 1/28 sch message.

## 2017-03-17 NOTE — Telephone Encounter (Signed)
I will put in scheduling msg No guarantee on time

## 2017-03-21 ENCOUNTER — Inpatient Hospital Stay (HOSPITAL_BASED_OUTPATIENT_CLINIC_OR_DEPARTMENT_OTHER): Payer: Medicare Other | Admitting: Hematology and Oncology

## 2017-03-21 ENCOUNTER — Telehealth: Payer: Self-pay | Admitting: Hematology and Oncology

## 2017-03-21 ENCOUNTER — Encounter: Payer: Self-pay | Admitting: Hematology and Oncology

## 2017-03-21 ENCOUNTER — Inpatient Hospital Stay: Payer: Medicare Other | Attending: Hematology and Oncology

## 2017-03-21 DIAGNOSIS — R200 Anesthesia of skin: Secondary | ICD-10-CM

## 2017-03-21 DIAGNOSIS — T451X5A Adverse effect of antineoplastic and immunosuppressive drugs, initial encounter: Secondary | ICD-10-CM

## 2017-03-21 DIAGNOSIS — D6481 Anemia due to antineoplastic chemotherapy: Secondary | ICD-10-CM | POA: Diagnosis not present

## 2017-03-21 DIAGNOSIS — Z5189 Encounter for other specified aftercare: Secondary | ICD-10-CM | POA: Insufficient documentation

## 2017-03-21 DIAGNOSIS — K5909 Other constipation: Secondary | ICD-10-CM

## 2017-03-21 DIAGNOSIS — R202 Paresthesia of skin: Secondary | ICD-10-CM

## 2017-03-21 DIAGNOSIS — C8330 Diffuse large B-cell lymphoma, unspecified site: Secondary | ICD-10-CM | POA: Diagnosis not present

## 2017-03-21 LAB — COMPREHENSIVE METABOLIC PANEL
ALK PHOS: 67 U/L (ref 40–150)
ALT: 14 U/L (ref 0–55)
ANION GAP: 15 — AB (ref 3–11)
AST: 28 U/L (ref 5–34)
Albumin: 3.7 g/dL (ref 3.5–5.0)
BILIRUBIN TOTAL: 0.5 mg/dL (ref 0.2–1.2)
BUN: 8 mg/dL (ref 7–26)
CALCIUM: 9.5 mg/dL (ref 8.4–10.4)
CO2: 27 mmol/L (ref 22–29)
Chloride: 96 mmol/L — ABNORMAL LOW (ref 98–109)
Creatinine, Ser: 1.15 mg/dL — ABNORMAL HIGH (ref 0.60–1.10)
GFR, EST AFRICAN AMERICAN: 57 mL/min — AB (ref >60)
GFR, EST NON AFRICAN AMERICAN: 49 mL/min — AB (ref >60)
Glucose, Bld: 113 mg/dL (ref 70–140)
Potassium: 3.1 mmol/L — ABNORMAL LOW (ref 3.5–5.1)
Sodium: 138 mmol/L (ref 136–145)
TOTAL PROTEIN: 6.6 g/dL (ref 6.4–8.3)

## 2017-03-21 LAB — CBC WITH DIFFERENTIAL/PLATELET
Basophils Absolute: 0 10*3/uL (ref 0.0–0.1)
Basophils Relative: 0 %
EOS ABS: 0 10*3/uL (ref 0.0–0.5)
Eosinophils Relative: 0 %
HEMATOCRIT: 31.6 % — AB (ref 34.8–46.6)
HEMOGLOBIN: 10.1 g/dL — AB (ref 11.6–15.9)
LYMPHS ABS: 0.9 10*3/uL (ref 0.9–3.3)
Lymphocytes Relative: 11 %
MCH: 28.9 pg (ref 25.1–34.0)
MCHC: 32 g/dL (ref 31.5–36.0)
MCV: 90.5 fL (ref 79.5–101.0)
MONOS PCT: 13 %
Monocytes Absolute: 1.1 10*3/uL — ABNORMAL HIGH (ref 0.1–0.9)
NEUTROS PCT: 76 %
Neutro Abs: 6 10*3/uL (ref 1.5–6.5)
Platelets: 378 10*3/uL (ref 145–400)
RBC: 3.49 MIL/uL — ABNORMAL LOW (ref 3.70–5.45)
RDW: 17.9 % — ABNORMAL HIGH (ref 11.2–14.5)
WBC: 8 10*3/uL (ref 3.9–10.3)

## 2017-03-21 LAB — SAMPLE TO BLOOD BANK

## 2017-03-21 NOTE — Progress Notes (Signed)
[facility] OFFICE PROGRESS NOTE  Patient Care Team: Patient, No Pcp Per as PCP - General (General Practice)  SUMMARY OF ONCOLOGIC HISTORY:   Diffuse large B cell lymphoma ([place])   12/12/2016 Imaging    CT scan showed  1. Extensive mesenteric, retroperitoneal, left hilar, mediastinal, left supraclavicular and left axillary adenopathy. Differential considerations include metastatic adenopathy, lymphoma and leukemia. The size of the nodes in the mesentery and retroperitoneum are suggestive of non-Hodgkin's lymphoma. 2. 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 Differential considerations include metastatic disease and lymphoma. A left upper lobe primary lung carcinoma is a possibility. 3. 3.2 cm solid right renal mass. Differential considerations include renal cell carcinoma and oncocytoma. 4. Small amount of free peritoneal fluid, most likely due to lymphatic obstruction by the mesenteric adenopathy. 5. Left fourth rib fracture. 6. Colonic diverticulosis. 7. Calcific coronary artery and aortic atherosclerosis. Aortic Atherosclerosis (ICD10-I70.0).       12/12/2016 - 12/14/2016 Hospital Admission    He was briefly admitted and evaluated for weakness.  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 Tumor fills the right neural foramina at T6-7 and T7-8 and completely replaces the T7 vertebral body.      12/18/2016 Surgery    She underwent procedure: Decompressive thoracic laminectomy from T5-T8 for resection of epidural tumor        12/18/2016 Pathology Results    1. Soft tissue mass, simple excision, thoracic dorsal  epidural mass - DIFFUSE LARGE B-CELL LYMPHOMA. - SEE ONCOLOGY TABLE. 2. Soft tissue mass, simple excision, thoracic dorsal epidural mass - DIFFUSE LARGE B-CELL LYMPHOMA. Microscopic Comment 1. LYMPHOMA Histologic type: Non-Hodgkin B-cell lymphoma: Diffuse large B-cell lymphoma. Grade (if applicable): High grade. Flow cytometry: N/A. Immunohistochemical stains: CD20. CD3, CD5, CD10, CD23, CD30, CD15, PAX-5, Ki-67, bcl-2, bcl-6, CD34. Touch preps/imprints: N/A. Comments: There is a diffuse infiltrate of atypical lymphocytes. The lymphocytes are medium to large in size with irregular nuclear contours. There are background smaller lymphocytes. While there are admixed smaller cells, there are diffuse areas of large cells. The large lymphocytes are positive for CD20, CD10, bcl-6, and bcl-2. CD3 and CD5 highlight admixed T-cells. CD34, CD30, CD23, and CD15 are negative. PAX-5 is positive. Ki-67 is variable with areas up to 70%. 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       12/18/2016 - 01/17/2017 Hospital Admission    The patient was readmitted to the hospital due to complete weakness secondary to spinal cord compression.  She underwent surgery and pathology report confirmed diagnosis of diffuse large B-cell lymphoma. She received IT chemo on 01/16/17      01/16/2017 Pathology Results    CEREBROSPINAL FLUID (SPECIMEN 1 OF 1 COLLECTED 01/16/17): NO MALIGNANT CELLS IDENTIFIED.      02/03/2017 - 02/07/2017 Hospital Admission    She is admitted for cycle 2 of chemo      02/24/2017 - 02/28/2017 Hospital Admission    She is admitted for cycle 3 of chemotherapy      03/13/2017 PET scan  1. Low level FDG uptake associated with previous areas of soft tissue tumor within the chest and abdomen. Deauville criteria 2 and 3  compatible with treated tumor. No sites of residual metabolically active tumor (Deauville criteria 4 or 5) identified. 2. Mild diffuse uptake throughout the bone marrow is noted which is nonspecific in may reflect treatment related changes. 3. Stable size of previously noted complex lesion arising from the posterior cortex of the right kidney which now measures fluid attenuation. This is incompletely characterized on today's given lack of IV contrast material. 4. Aortic Atherosclerosis (ICD10-I70.0). Multi vessel coronary artery calcifications noted.      03/13/2017 Imaging    Study Conclusions  - Procedure narrative: Transthoracic echocardiography. Image quality was suboptimal. The study was technically difficult, as a result of poor acoustic windows, poor sound wave transmission, chest wall deformity, and body habitus. - Left ventricle: GLLS is normal at -19% The cavity size was normal. There was mild focal basal hypertrophy of the septum. Systolic function was vigorous. The estimated ejection fraction was in the range of 65% to 70%. Wall motion was normal; there were no regional wall motion abnormalities. There was an increased relative contribution of atrial contraction to ventricular filling. Doppler parameters are consistent with abnormal left ventricular relaxation (grade 1 diastolic dysfunction). - Pulmonary arteries: Systolic pressure could not be accurately estimated.       INTERVAL HISTORY: Please see below for problem oriented charting. She returns with her daughter for further follow-up Since last time I saw her, she has improved significantly The pressure sore has healed She had one episode of nausea after eating with vomiting but that resolved She was mildly constipated but resolved with laxative She denies worsening peripheral neuropathy or weakness  REVIEW OF SYSTEMS:   Constitutional: Denies fevers, chills or abnormal weight loss Eyes: Denies blurriness of vision Ears,  nose, mouth, throat, and face: Denies mucositis or sore throat Respiratory: Denies cough, dyspnea or wheezes Cardiovascular: Denies palpitation, chest discomfort or lower extremity swelling Skin: Denies abnormal skin rashes Lymphatics: Denies new lymphadenopathy or easy bruising Behavioral/Psych: Mood is stable, no new changes  All other systems were reviewed with the patient and are negative.  I have reviewed the past medical history, past surgical history, social history and family history with the patient and they are unchanged from previous note.  ALLERGIES:  is allergic to contrast media [iodinated diagnostic agents].  MEDICATIONS:  Current Outpatient Medications  Medication Sig Dispense Refill  . acyclovir (ZOVIRAX) 400 MG tablet Take 1 tablet (400 mg total) by mouth 2 (two) times daily. 60 tablet 9  . amLODipine (NORVASC) 10 MG tablet Take 1 tablet (10 mg total) by mouth daily. 30 tablet 0  . bisacodyl (DULCOLAX) 5 MG EC tablet Take 5 mg by mouth daily as needed for moderate constipation.    . cyclobenzaprine (FLEXERIL) 10 MG tablet Take 1 tablet (10 mg total) by mouth 3 (three) times daily as needed for muscle spasms. (Patient taking differently: Take 10 mg by mouth 2 (two) times daily. ) 90 tablet 1  . Lactulose 20 GM/30ML SOLN Take 30 mLs (20 g total) by mouth 3 (three) times daily. 473 mL 1  . lidocaine-prilocaine (EMLA) cream Apply 1 application topically as needed. (Patient taking differently: Apply 1 application topically as needed (access port). ) 30 g 6  . morphine (MS CONTIN) 15 MG 12 hr tablet Take 1 tablet (15 mg total) by mouth every 12 (twelve) hours. (Patient taking differently: Take 15 mg  by mouth every 12 (twelve) hours as needed for pain. ) 60 tablet 0  . ondansetron (ZOFRAN) 8 MG tablet Take 1 tablet (8 mg total) by mouth every 8 (eight) hours as needed for nausea (not responsive to prochlorperazine (COMPAZINE)). 60 tablet 1  . polyethylene glycol (MIRALAX / GLYCOLAX)  packet Take 17 g by mouth daily as needed for mild constipation.    . prochlorperazine (COMPAZINE) 10 MG tablet Take 1 tablet (10 mg total) by mouth every 6 (six) hours as needed for nausea or vomiting. 60 tablet 1  . traMADol (ULTRAM) 50 MG tablet Take 2 tablets (100 mg total) by mouth every 6 (six) hours as needed for severe pain. 90 tablet 0   No current facility-administered medications for this visit.     PHYSICAL EXAMINATION: ECOG PERFORMANCE STATUS: 1 - Symptomatic but completely ambulatory  Vitals:   03/21/17 0908  BP: (!) 141/97  Pulse: (!) 101  Resp: 18  Temp: 98 F (36.7 C)  SpO2: 100%   Filed Weights    GENERAL:alert, no distress and comfortable SKIN: skin color, texture, turgor are normal, no rashes or significant lesions EYES: normal, Conjunctiva are pink and non-injected, sclera clear OROPHARYNX:no exudate, no erythema and lips, buccal mucosa, and tongue normal  NECK: supple, thyroid normal size, non-tender, without nodularity LYMPH:  no palpable lymphadenopathy in the cervical, axillary or inguinal LUNGS: clear to auscultation and percussion with normal breathing effort HEART: regular rate & rhythm and no murmurs and no lower extremity edema ABDOMEN:abdomen soft, non-tender and normal bowel sounds Musculoskeletal:no cyanosis of digits and no clubbing  NEURO: alert & oriented x 3 with fluent speech, no focal motor/sensory deficits  LABORATORY DATA:  I have reviewed the data as listed    Component Value Date/Time   NA 138 03/21/2017 0822   NA 134 (L) 02/19/2017 0925   K 3.1 (L) 03/21/2017 0822   K 3.0 (LL) 02/19/2017 0925   CL 96 (L) 03/21/2017 0822   CO2 27 03/21/2017 0822   CO2 29 02/19/2017 0925   GLUCOSE 113 03/21/2017 0822   GLUCOSE 135 02/19/2017 0925   BUN 8 03/21/2017 0822   BUN 7.4 02/19/2017 0925   CREATININE 1.15 (H) 03/21/2017 0822   CREATININE 0.9 02/19/2017 0925   CALCIUM 9.5 03/21/2017 0822   CALCIUM 8.9 02/19/2017 0925   PROT 6.6  03/21/2017 0822   PROT 6.4 02/19/2017 0925   ALBUMIN 3.7 03/21/2017 0822   ALBUMIN 3.4 (L) 02/19/2017 0925   AST 28 03/21/2017 0822   AST 16 02/19/2017 0925   ALT 14 03/21/2017 0822   ALT 13 02/19/2017 0925   ALKPHOS 67 03/21/2017 0822   ALKPHOS 86 02/19/2017 0925   BILITOT 0.5 03/21/2017 0822   BILITOT 0.65 02/19/2017 0925   GFRNONAA 49 (L) 03/21/2017 0822   GFRAA 57 (L) 03/21/2017 0822    No results found for: SPEP, UPEP  Lab Results  Component Value Date   WBC 8.0 03/21/2017   NEUTROABS 6.0 03/21/2017   HGB 10.1 (L) 03/21/2017   HCT 31.6 (L) 03/21/2017   MCV 90.5 03/21/2017   PLT 378 03/21/2017      Chemistry      Component Value Date/Time   NA 138 03/21/2017 0822   NA 134 (L) 02/19/2017 0925   K 3.1 (L) 03/21/2017 0822   K 3.0 (LL) 02/19/2017 0925   CL 96 (L) 03/21/2017 0822   CO2 27 03/21/2017 0822   CO2 29 02/19/2017 0925   BUN 8 03/21/2017  4132   BUN 7.4 02/19/2017 0925   CREATININE 1.15 (H) 03/21/2017 0822   CREATININE 0.9 02/19/2017 0925      Component Value Date/Time   CALCIUM 9.5 03/21/2017 0822   CALCIUM 8.9 02/19/2017 0925   ALKPHOS 67 03/21/2017 0822   ALKPHOS 86 02/19/2017 0925   AST 28 03/21/2017 0822   AST 16 02/19/2017 0925   ALT 14 03/21/2017 0822   ALT 13 02/19/2017 0925   BILITOT 0.5 03/21/2017 0822   BILITOT 0.65 02/19/2017 0925       RADIOGRAPHIC STUDIES: I have personally reviewed the radiological images as listed and agreed with the findings in the report. Nm Pet Image Initial (pi) Skull Base To Thigh  Result Date: 03/13/2017 CLINICAL DATA:  Subsequent treatment strategy for lymphoma. EXAM: NUCLEAR MEDICINE PET SKULL BASE TO THIGH TECHNIQUE: 10.1 mCi F-18 FDG was injected intravenously. Full-ring PET imaging was performed from the skull base to thigh after the radiotracer. CT data was obtained and used for attenuation correction and anatomic localization. FASTING BLOOD GLUCOSE:  Value: 119 mg/dl COMPARISON:  12/12/2016 FINDINGS:  For internal reference: Blood pool activity within the aorta has an SUV max of 2.89. Activity within the liver has an SUV max of 3.51. NECK: No hypermetabolic lymph nodes in the neck. CHEST: Mild cardiac enlargement. Aortic atherosclerosis. Calcification in the LAD and left circumflex and RCA coronary arteries noted. Significantly diminished size of previously enlarged left internal mammary lymph node. SUV max currently 1.77, Deauville criteria 2. ABDOMEN/PELVIS: There is no abnormal uptake within the liver, pancreas, spleen or adrenal glands. Previously identified complex lesion arising from the posterior aspect of the interpolar right kidney measures 3.1 cm and 6 HU. Previously this measured 3.1 cm and 51 HU (postcontrast). Left retroperitoneal soft tissue nodularity measures 1.2 by 1.7 cm and has an SUV max equal to 2.88, Deauville criteria 2 compatible with treated tumor. Previously this measured 4.2 by 3.3. Soft tissue mass within the central mesenteric measures 4.4 x 2.8 cm and has an SUV max equal to 3.4, Deauville criteria 3 (compatible with treated tumor). Previously this measured 5.7 x 6.5 cm. Right lower quadrant soft tissue infiltration measures 4.1 by 1.6 cm and has an SUV max equal to 3.03, Deauville criteria 3 (compatible with treated tumor). Previously 8.0 x 3.7 cm. SKELETON: Mildly diffuse increased uptake throughout the bone marrow of the axial and appendicular skeleton is identified. Nonspecific. Left lateral chest wall mass involving the lateral aspect of the left fifth rib has nearly completely resolved with residual pathologic fracture of the rib. SUV max is equal to 3.1, Deauville criteria 3. The previously identified paraspinal soft tissue mass involving the eighth rib has significantly diminished in size in the interval. SUV max within this area is equal to 2.97,Deauville 3. IMPRESSION: 1. Low level FDG uptake associated with previous areas of soft tissue tumor within the chest and abdomen.  Deauville criteria 2 and 3 compatible with treated tumor. No sites of residual metabolically active tumor (Deauville criteria 4 or 5) identified. 2. Mild diffuse uptake throughout the bone marrow is noted which is nonspecific in may reflect treatment related changes. 3. Stable size of previously noted complex lesion arising from the posterior cortex of the right kidney which now measures fluid attenuation. This is incompletely characterized on today's given lack of IV contrast material. 4. Aortic Atherosclerosis (ICD10-I70.0). Multi vessel coronary artery calcifications noted. Electronically Signed   By: Kerby Moors M.D.   On: 03/13/2017 10:22   Dg Fluoro  Guided Needle Plc Aspiration/injection Loc  Result Date: 02/27/2017 CLINICAL DATA:  Diffuse large B-cell lymphoma EXAM: FLUOROSCOPICALLY GUIDED LUMBAR PUNCTURE FOR INTRATHECAL CHEMOTHERAPY TECHNIQUE: Informed consent was obtained from the patient prior to the procedure, including potential complications of headache, allergy, and pain. A 'time out' was performed. With the patient prone, the lower back was prepped with Betadine. 1% Lidocaine was used for local anesthesia. Lumbar puncture was performed at the L3-4 using a 20 gauge needle with return of clear CSF. 12 milligrams in a total of 5 mm volume of methotrexate was injected into the subarachnoid space. The patient tolerated the procedure well without apparent complication. FLUOROSCOPY TIME:  19 sec IMPRESSION: Intrathecal injection of chemotherapy without complication Electronically Signed   By: Rolm Baptise M.D.   On: 02/27/2017 12:05    ASSESSMENT & PLAN:  Diffuse large B cell lymphoma ([place]) She is recovering nicely since last week Her blood count is satisfactory We discussed the risk and benefit of pursuing further chemotherapy and she agreed to proceed The plan is for total 6 cycles of treatment She would be admitted to the hospital on 03/24/2017.  I would give her rituximab on day 3 and  intrathecal methotrexate on day 4 She will return here on day 8 for Neulasta injection For the future, I will space out the interval between cycle 4 and cycle 5 by giving her 3 weeks break instead of 2  Anemia due to antineoplastic chemotherapy She has fatigue but does not need blood transfusion We will monitor closely  Numbness and tingling of both lower extremities She has generalized weakness since recent chemotherapy She would continue home PT  Constipation We discussed aggressive laxative therapy   No orders of the defined types were placed in this encounter.  All questions were answered. The patient knows to call the clinic with any problems, questions or concerns. No barriers to learning was detected. I spent 25 minutes counseling the patient face to face. The total time spent in the appointment was 30 minutes and more than 50% was on counseling and review of test results     Heath Lark, MD 03/21/2017 10:28 AM

## 2017-03-21 NOTE — Assessment & Plan Note (Signed)
She is recovering nicely since last week Her blood count is satisfactory We discussed the risk and benefit of pursuing further chemotherapy and she agreed to proceed The plan is for total 6 cycles of treatment She would be admitted to the hospital on 03/24/2017.  I would give her rituximab on day 3 and intrathecal methotrexate on day 4 She will return here on day 8 for Neulasta injection For the future, I will space out the interval between cycle 4 and cycle 5 by giving her 3 weeks break instead of 2

## 2017-03-21 NOTE — Assessment & Plan Note (Signed)
We discussed aggressive laxative therapy 

## 2017-03-21 NOTE — Assessment & Plan Note (Signed)
She has generalized weakness since recent chemotherapy She would continue home PT

## 2017-03-21 NOTE — Telephone Encounter (Signed)
Scheduled appt per 2/1 los - Gave patient AVS and calender per los.  

## 2017-03-21 NOTE — Assessment & Plan Note (Signed)
She has fatigue but does not need blood transfusion We will monitor closely

## 2017-03-24 ENCOUNTER — Inpatient Hospital Stay (HOSPITAL_COMMUNITY)
Admission: AD | Admit: 2017-03-24 | Discharge: 2017-03-28 | DRG: 847 | Disposition: A | Discharge status: Home / Self Care | Payer: Medicare Other | Source: Ambulatory Visit | Attending: Hematology and Oncology | Admitting: Hematology and Oncology

## 2017-03-24 ENCOUNTER — Encounter (HOSPITAL_COMMUNITY): Payer: Self-pay | Admitting: *Deleted

## 2017-03-24 ENCOUNTER — Other Ambulatory Visit: Payer: Self-pay | Admitting: Hematology and Oncology

## 2017-03-24 DIAGNOSIS — Z5111 Encounter for antineoplastic chemotherapy: Secondary | ICD-10-CM | POA: Diagnosis present

## 2017-03-24 DIAGNOSIS — C8330 Diffuse large B-cell lymphoma, unspecified site: Secondary | ICD-10-CM | POA: Diagnosis present

## 2017-03-24 DIAGNOSIS — D6481 Anemia due to antineoplastic chemotherapy: Secondary | ICD-10-CM | POA: Diagnosis present

## 2017-03-24 DIAGNOSIS — R209 Unspecified disturbances of skin sensation: Secondary | ICD-10-CM | POA: Diagnosis not present

## 2017-03-24 DIAGNOSIS — Z8249 Family history of ischemic heart disease and other diseases of the circulatory system: Secondary | ICD-10-CM | POA: Diagnosis not present

## 2017-03-24 DIAGNOSIS — Z800 Family history of malignant neoplasm of digestive organs: Secondary | ICD-10-CM

## 2017-03-24 DIAGNOSIS — K5909 Other constipation: Secondary | ICD-10-CM | POA: Diagnosis present

## 2017-03-24 DIAGNOSIS — Z5112 Encounter for antineoplastic immunotherapy: Secondary | ICD-10-CM

## 2017-03-24 DIAGNOSIS — K5900 Constipation, unspecified: Secondary | ICD-10-CM | POA: Diagnosis present

## 2017-03-24 DIAGNOSIS — Z90710 Acquired absence of both cervix and uterus: Secondary | ICD-10-CM | POA: Diagnosis not present

## 2017-03-24 DIAGNOSIS — R200 Anesthesia of skin: Secondary | ICD-10-CM | POA: Diagnosis present

## 2017-03-24 DIAGNOSIS — R202 Paresthesia of skin: Secondary | ICD-10-CM | POA: Diagnosis present

## 2017-03-24 DIAGNOSIS — Z833 Family history of diabetes mellitus: Secondary | ICD-10-CM | POA: Diagnosis not present

## 2017-03-24 DIAGNOSIS — Z91041 Radiographic dye allergy status: Secondary | ICD-10-CM

## 2017-03-24 DIAGNOSIS — T451X5A Adverse effect of antineoplastic and immunosuppressive drugs, initial encounter: Secondary | ICD-10-CM | POA: Diagnosis present

## 2017-03-24 DIAGNOSIS — I10 Essential (primary) hypertension: Secondary | ICD-10-CM | POA: Diagnosis present

## 2017-03-24 DIAGNOSIS — E876 Hypokalemia: Secondary | ICD-10-CM | POA: Diagnosis not present

## 2017-03-24 DIAGNOSIS — Z825 Family history of asthma and other chronic lower respiratory diseases: Secondary | ICD-10-CM | POA: Diagnosis not present

## 2017-03-24 DIAGNOSIS — Z8349 Family history of other endocrine, nutritional and metabolic diseases: Secondary | ICD-10-CM | POA: Diagnosis not present

## 2017-03-24 MED ORDER — SODIUM CHLORIDE 0.9 % IV SOLN
Freq: Once | INTRAVENOUS | Status: AC
  Administered 2017-03-24: 18 mg via INTRAVENOUS
  Filled 2017-03-24: qty 4

## 2017-03-24 MED ORDER — HOT PACK MISC ONCOLOGY
1.0000 | Freq: Once | Status: DC | PRN
  Filled 2017-03-24: qty 1

## 2017-03-24 MED ORDER — ALTEPLASE 2 MG IJ SOLR
2.0000 mg | Freq: Once | INTRAMUSCULAR | Status: DC | PRN
  Filled 2017-03-24: qty 2

## 2017-03-24 MED ORDER — LIDOCAINE-PRILOCAINE 2.5-2.5 % EX CREA: 1.0000 "application " | TOPICAL_CREAM | CUTANEOUS | Status: DC | PRN

## 2017-03-24 MED ORDER — SODIUM CHLORIDE 0.9 % IV SOLN
8.0000 mg | Freq: Three times a day (TID) | INTRAVENOUS | Status: DC | PRN
  Filled 2017-03-24: qty 4

## 2017-03-24 MED ORDER — POLYETHYLENE GLYCOL 3350 17 G PO PACK
17.0000 g | PACK | Freq: Every day | ORAL | Status: DC
  Administered 2017-03-24 – 2017-03-27 (×3): 17 g via ORAL
  Filled 2017-03-24 (×5): qty 1

## 2017-03-24 MED ORDER — TRAMADOL HCL 50 MG PO TABS
100.0000 mg | ORAL_TABLET | Freq: Four times a day (QID) | ORAL | Status: DC | PRN
  Administered 2017-03-25 – 2017-03-28 (×4): 100 mg via ORAL
  Filled 2017-03-24 (×4): qty 2

## 2017-03-24 MED ORDER — CYCLOBENZAPRINE HCL 10 MG PO TABS
10.0000 mg | ORAL_TABLET | Freq: Three times a day (TID) | ORAL | Status: DC | PRN
  Administered 2017-03-25 – 2017-03-27 (×3): 10 mg via ORAL
  Filled 2017-03-24 (×3): qty 1

## 2017-03-24 MED ORDER — ACYCLOVIR 400 MG PO TABS
400.0000 mg | ORAL_TABLET | Freq: Two times a day (BID) | ORAL | Status: DC
  Administered 2017-03-24 – 2017-03-28 (×9): 400 mg via ORAL
  Filled 2017-03-24 (×9): qty 1

## 2017-03-24 MED ORDER — VINCRISTINE SULFATE CHEMO INJECTION 1 MG/ML
Freq: Once | INTRAVENOUS | Status: AC
  Administered 2017-03-24: 13:00:00 via INTRAVENOUS
  Filled 2017-03-24: qty 8

## 2017-03-24 MED ORDER — ONDANSETRON HCL 4 MG/2ML IJ SOLN
4.0000 mg | Freq: Three times a day (TID) | INTRAMUSCULAR | Status: DC | PRN
  Administered 2017-03-28: 4 mg via INTRAVENOUS
  Filled 2017-03-24: qty 2

## 2017-03-24 MED ORDER — VINCRISTINE SULFATE CHEMO INJECTION 1 MG/ML: Freq: Once | INTRAVENOUS | Status: DC

## 2017-03-24 MED ORDER — PROCHLORPERAZINE MALEATE 10 MG PO TABS: 10.0000 mg | ORAL_TABLET | Freq: Four times a day (QID) | ORAL | Status: DC | PRN

## 2017-03-24 MED ORDER — ONDANSETRON 4 MG PO TBDP
4.0000 mg | ORAL_TABLET | Freq: Three times a day (TID) | ORAL | Status: DC | PRN
  Filled 2017-03-24 (×2): qty 2

## 2017-03-24 MED ORDER — COLD PACK MISC ONCOLOGY
1.0000 | Freq: Once | Status: DC | PRN
  Filled 2017-03-24: qty 1

## 2017-03-24 MED ORDER — SENNOSIDES-DOCUSATE SODIUM 8.6-50 MG PO TABS
1.0000 | ORAL_TABLET | Freq: Two times a day (BID) | ORAL | Status: DC
  Administered 2017-03-24 – 2017-03-27 (×8): 1 via ORAL
  Filled 2017-03-24 (×11): qty 1

## 2017-03-24 MED ORDER — HEPARIN SOD (PORK) LOCK FLUSH 100 UNIT/ML IV SOLN: 250.0000 [IU] | Freq: Once | INTRAVENOUS | Status: DC | PRN

## 2017-03-24 MED ORDER — PROCHLORPERAZINE EDISYLATE 5 MG/ML IJ SOLN: 10.0000 mg | Freq: Four times a day (QID) | INTRAMUSCULAR | Status: DC | PRN

## 2017-03-24 MED ORDER — SODIUM CHLORIDE 0.9 % IV SOLN
INTRAVENOUS | Status: DC
  Administered 2017-03-24 – 2017-03-28 (×2): via INTRAVENOUS

## 2017-03-24 MED ORDER — SODIUM CHLORIDE 0.9% FLUSH: 3.0000 mL | INTRAVENOUS | Status: DC | PRN

## 2017-03-24 MED ORDER — SODIUM CHLORIDE 0.9% FLUSH: 10.0000 mL | INTRAVENOUS | Status: DC | PRN

## 2017-03-24 MED ORDER — ONDANSETRON HCL 4 MG PO TABS: 4.0000 mg | ORAL_TABLET | Freq: Three times a day (TID) | ORAL | Status: DC | PRN

## 2017-03-24 MED ORDER — ACETAMINOPHEN 325 MG PO TABS: 650.0000 mg | ORAL_TABLET | ORAL | Status: DC | PRN

## 2017-03-24 MED ORDER — ENOXAPARIN SODIUM 40 MG/0.4ML ~~LOC~~ SOLN
40.0000 mg | SUBCUTANEOUS | Status: AC
  Administered 2017-03-24 – 2017-03-25 (×2): 40 mg via SUBCUTANEOUS
  Filled 2017-03-24 (×2): qty 0.4

## 2017-03-24 MED ORDER — HEPARIN SOD (PORK) LOCK FLUSH 100 UNIT/ML IV SOLN
500.0000 [IU] | Freq: Once | INTRAVENOUS | Status: AC | PRN
  Administered 2017-03-28: 500 [IU]

## 2017-03-24 NOTE — Progress Notes (Signed)
Calculation and dosage of Doxorubicin ,Etoposide and Vincristin verified by Nancy Marus RN

## 2017-03-24 NOTE — H&P (Signed)
[facility] ADMISSION NOTE  Patient Care Team: Patient, No Pcp Per as PCP - General (General Practice)  CHIEF COMPLAINTS/PURPOSE OF ADMISSION Diffuse large B-cell lymphoma, for cycle 4 of chemotherapy  HISTORY OF PRESENTING ILLNESS:  Katrina [name] 67 y.o. female is admitted for high dose, inpatient chemotherapy Summary of oncologic history as follows:   Diffuse large B cell lymphoma ([place])   12/12/2016 Imaging    CT scan showed  1. Extensive mesenteric, retroperitoneal, left hilar, mediastinal, left supraclavicular and left axillary adenopathy. Differential considerations include metastatic adenopathy, lymphoma and leukemia. The size of the nodes in the mesentery and retroperitoneum are suggestive of non-Hodgkin's lymphoma. 2. 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 Differential considerations include metastatic disease and lymphoma. A left upper lobe primary lung carcinoma is a possibility. 3. 3.2 cm solid right renal mass. Differential considerations include renal cell carcinoma and oncocytoma. 4. Small amount of free peritoneal fluid, most likely due to lymphatic obstruction by the mesenteric adenopathy. 5. Left fourth rib fracture. 6. Colonic diverticulosis. 7. Calcific coronary artery and aortic atherosclerosis. Aortic Atherosclerosis (ICD10-I70.0).       12/12/2016 - 12/14/2016 Hospital Admission    He was briefly admitted and evaluated for weakness.  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 Tumor fills the right neural foramina at T6-7 and T7-8 and completely replaces the T7 vertebral body.       12/18/2016 Surgery    She underwent procedure: Decompressive thoracic laminectomy from T5-T8 for resection of epidural tumor        12/18/2016 Pathology Results    1. Soft tissue mass, simple excision, thoracic dorsal epidural mass - DIFFUSE LARGE B-CELL LYMPHOMA. - SEE ONCOLOGY TABLE. 2. Soft tissue mass, simple excision, thoracic dorsal epidural mass - DIFFUSE LARGE B-CELL LYMPHOMA. Microscopic Comment 1. LYMPHOMA Histologic type: Non-Hodgkin B-cell lymphoma: Diffuse large B-cell lymphoma. Grade (if applicable): High grade. Flow cytometry: N/A. Immunohistochemical stains: CD20. CD3, CD5, CD10, CD23, CD30, CD15, PAX-5, Ki-67, bcl-2, bcl-6, CD34. Touch preps/imprints: N/A. Comments: There is a diffuse infiltrate of atypical lymphocytes. The lymphocytes are medium to large in size with irregular nuclear contours. There are background smaller lymphocytes. While there are admixed smaller cells, there are diffuse areas of large cells. The large lymphocytes are positive for CD20, CD10, bcl-6, and bcl-2. CD3 and CD5 highlight admixed T-cells. CD34, CD30, CD23, and CD15 are negative. PAX-5 is positive. Ki-67 is variable with areas up to 70%. 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       12/18/2016 - 01/17/2017 Hospital Admission    The patient was readmitted to the hospital due to complete weakness secondary to spinal cord compression.  She underwent surgery and pathology report confirmed diagnosis of diffuse large B-cell lymphoma. She received IT chemo on 01/16/17      01/16/2017 Pathology Results    CEREBROSPINAL FLUID (SPECIMEN 1 OF 1 COLLECTED 01/16/17): NO MALIGNANT CELLS IDENTIFIED.      02/03/2017 - 02/07/2017 Hospital Admission    She is admitted for  cycle 2 of chemo      02/24/2017 - 02/28/2017 Hospital  Admission    She is admitted for cycle 3 of chemotherapy      03/13/2017 PET scan    1. Low level FDG uptake associated with previous areas of soft tissue tumor within the chest and abdomen. Deauville criteria 2 and 3 compatible with treated tumor. No sites of residual metabolically active tumor (Deauville criteria 4 or 5) identified. 2. Mild diffuse uptake throughout the bone marrow is noted which is nonspecific in may reflect treatment related changes. 3. Stable size of previously noted complex lesion arising from the posterior cortex of the right kidney which now measures fluid attenuation. This is incompletely characterized on today's given lack of IV contrast material. 4. Aortic Atherosclerosis (ICD10-I70.0). Multi vessel coronary artery calcifications noted.      03/13/2017 Imaging    Study Conclusions  - Procedure narrative: Transthoracic echocardiography. Image quality was suboptimal. The study was technically difficult, as a result of poor acoustic windows, poor sound wave transmission, chest wall deformity, and body habitus. - Left ventricle: GLLS is normal at -19% The cavity size was normal. There was mild focal basal hypertrophy of the septum. Systolic function was vigorous. The estimated ejection fraction was in the range of 65% to 70%. Wall motion was normal; there were no regional wall motion abnormalities. There was an increased relative contribution of atrial contraction to ventricular filling. Doppler parameters are consistent with abnormal left ventricular relaxation (grade 1 diastolic dysfunction). - Pulmonary arteries: Systolic pressure could not be accurately estimated.      Since the last time I saw her, she feels well.  She is mildly constipated.  She is mildly weak but denies fall.  No recent infection  MEDICAL HISTORY:  Past Medical History:  Diagnosis Date  . Anemia   . Arthritis   . 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 Hypertension   . Lymphadenopathy 11/2016    "extensive"  . Paresthesia of lower extremity 11/2016   bilateral  . Pulmonary nodules 11/2016  . Right renal mass 11/2016    SURGICAL HISTORY: Past Surgical History:  Procedure Laterality Date  . ABDOMINAL HYSTERECTOMY    . BREAST LUMPECTOMY     left  . COLONOSCOPY N/A 11/02/2013   Procedure: COLONOSCOPY;  Surgeon: Danie Binder, MD;  [facility];  Service: Endoscopy;  Laterality: N/A;  1:15 - moved to [place] notified pt  . IR FLUORO GUIDE PORT INSERTION RIGHT  12/30/2016  . IR US GUIDE VASC ACCESS RIGHT  12/30/2016  . LAMINECTOMY N/A 12/18/2016   Procedure: DECOMPRESSION T5-8/THORACIC;  Surgeon: Kary Kos, MD;  Location: [facility];  Service: Neurosurgery;  Laterality: N/A;  Decompressive Thoracic Laminectomy, Thoracic five - six, Thoraic six - seven, Thoracic seven - eight  . SHOULDER SURGERY     right    SOCIAL HISTORY: Social History   Socioeconomic History  . Marital status: Widowed    Spouse name: Not on file  . Number of children: Not on file  . Years of education: Not on file  . Highest education level: Not on file  Social Needs  . Financial resource strain: Not on file  . Food insecurity - worry: Not on file  . Food insecurity - inability: Not on file  . Transportation needs - medical: Not on file  . Transportation needs - non-medical: Not on file  Occupational History  . Not on file  Tobacco Use  . Smoking status:  Never Smoker  . Smokeless tobacco: Never Used  Substance and Sexual Activity  . Alcohol use: No  . Drug use: No  . Sexual activity: No  Other Topics Concern  . Not on file  Social History Narrative  . Not on file    FAMILY HISTORY: Family History  Problem Relation Age of Onset  . Colon cancer Sister        at age 34  . Colon cancer Brother        at age 57  . COPD Mother   . Diabetes Mellitus II Mother   . CAD Mother        Angina  . Colon cancer Father   . Thyroid disease Sister   . Diabetes Mellitus II Sister      ALLERGIES:  is allergic to contrast media [iodinated diagnostic agents].  MEDICATIONS:  Current Facility-Administered Medications  Medication Dose Route Frequency Provider Last Rate Last Dose  . 0.9 %  sodium chloride infusion   Intravenous Continuous Alvy Bimler, Tatem Fesler, MD      . acetaminophen (TYLENOL) tablet 650 mg  650 mg Oral Q4H PRN Alvy Bimler, Adreona Brand, MD      . acyclovir (ZOVIRAX) tablet 400 mg  400 mg Oral BID Tyreisha Ungar, MD      . alteplase (CATHFLO ACTIVASE) injection 2 mg  2 mg Intracatheter Once PRN Heath Lark, MD      . Cold Pack 1 packet  1 packet Topical Once PRN Alvy Bimler, Itzell Bendavid, MD      . cyclobenzaprine (FLEXERIL) tablet 10 mg  10 mg Oral TID PRN Alvy Bimler, Dominica Kent, MD      . DOXOrubicin (ADRIAMYCIN) 18 mg, etoposide (VEPESID) 94 mg, vinCRIStine (ONCOVIN) 0.9 mg in sodium chloride 0.9 % 500 mL chemo infusion   Intravenous Once Alvy Bimler, Kaliah Haddaway, MD      . enoxaparin (LOVENOX) injection 40 mg  40 mg Subcutaneous Q24H Kaylla Cobos, MD      . heparin lock flush 100 unit/mL  500 Units Intracatheter Once PRN Alvy Bimler, Coree Riester, MD      . heparin lock flush 100 unit/mL  250 Units Intracatheter Once PRN Heath Lark, MD      . Hot Pack 1 packet  1 packet Topical Once PRN Alvy Bimler, Adelaine Roppolo, MD      . lidocaine-prilocaine (EMLA) cream 1 application  1 application Topical PRN Alvy Bimler, Magally Vahle, MD      . ondansetron (ZOFRAN) tablet 4-8 mg  4-8 mg Oral Q8H PRN Alvy Bimler, Zadie Deemer, MD       Or  . ondansetron (ZOFRAN-ODT) disintegrating tablet 4-8 mg  4-8 mg Oral Q8H PRN Alvy Bimler, Moriyah Byington, MD       Or  . ondansetron (ZOFRAN) injection 4 mg  4 mg Intravenous Q8H PRN Greyson Peavy, MD       Or  . ondansetron (ZOFRAN) 8 mg in sodium chloride 0.9 % 50 mL IVPB  8 mg Intravenous Q8H PRN Andrey Mccaskill, MD      . ondansetron (ZOFRAN) 8 mg, dexamethasone (DECADRON) 10 mg in sodium chloride 0.9 % 50 mL IVPB   Intravenous Once Amen Dargis, MD      . polyethylene glycol (MIRALAX / GLYCOLAX) packet 17 g  17 g Oral Daily Jeran Hiltz, MD      . prochlorperazine  (COMPAZINE) tablet 10 mg  10 mg Oral Q6H PRN Leetta Hendriks, MD       Or  . prochlorperazine (COMPAZINE) injection 10 mg  10 mg Intravenous Q6H PRN Heath Lark, MD      .  senna-docusate (Senokot-S) tablet 1 tablet  1 tablet Oral BID Kota Ciancio, MD      . sodium chloride flush (NS) 0.9 % injection 10 mL  10 mL Intracatheter PRN Tashanna Dolin, MD      . sodium chloride flush (NS) 0.9 % injection 3 mL  3 mL Intravenous PRN Alvy Bimler, Konnar Ben, MD      . traMADol (ULTRAM) tablet 100 mg  100 mg Oral Q6H PRN Alvy Bimler, Estanislado Surgeon, MD        REVIEW OF SYSTEMS:   Constitutional: Denies fevers, chills or abnormal night sweats Eyes: Denies blurriness of vision, double vision or watery eyes Ears, nose, mouth, throat, and face: Denies mucositis or sore throat Respiratory: Denies cough, dyspnea or wheezes Cardiovascular: Denies palpitation, chest discomfort or lower extremity swelling Skin: Denies abnormal skin rashes Lymphatics: Denies new lymphadenopathy or easy bruising Neurological:Denies numbness, tingling or new weaknesses Behavioral/Psych: Mood is stable, no new changes  All other systems were reviewed with the patient and are negative.  PHYSICAL EXAMINATION: ECOG PERFORMANCE STATUS: 1 - Symptomatic but completely ambulatory  Vitals:   03/24/17 0845  BP: (!) 148/87  Pulse: 93  Resp: 18  SpO2: 96%   Filed Weights   03/24/17 0845  Weight: 197 lb 15.6 oz (89.8 kg)    GENERAL:alert, no distress and comfortable SKIN: skin color, texture, turgor are normal, no rashes or significant lesions EYES: normal, conjunctiva are pink and non-injected, sclera clear OROPHARYNX:no exudate, no erythema and lips, buccal mucosa, and tongue normal  NECK: supple, thyroid normal size, non-tender, without nodularity LYMPH:  no palpable lymphadenopathy in the cervical, axillary or inguinal LUNGS: clear to auscultation and percussion with normal breathing effort HEART: regular rate & rhythm and no murmurs and no lower extremity  edema ABDOMEN:abdomen soft, non-tender and normal bowel sounds Musculoskeletal:no cyanosis of digits and no clubbing  PSYCH: alert & oriented x 3 with fluent speech NEURO: no focal motor/sensory deficits  LABORATORY DATA:  I have reviewed the data as listed Lab Results  Component Value Date   WBC 8.0 03/21/2017   HGB 10.1 (L) 03/21/2017   HCT 31.6 (L) 03/21/2017   MCV 90.5 03/21/2017   PLT 378 03/21/2017   Recent Labs    03/03/17 0932 03/13/17 0849 03/21/17 0822  NA 136 136 138  K 3.0* 3.4 3.1*  CL 98 95* 96*  CO2 23 28 27   GLUCOSE 116 151* 113  BUN 13 9 8   CREATININE 0.76 1.15* 1.15*  CALCIUM 9.1 8.9 9.5  GFRNONAA >60 49* 49*  GFRAA >60 57* 57*  PROT 6.2* 6.1* 6.6  ALBUMIN 3.6 3.5 3.7  AST 37* 15 28  ALT 45 11 14  ALKPHOS 55 78 67  BILITOT 0.5 0.5 0.5    RADIOGRAPHIC STUDIES: I have personally reviewed the radiological images as listed and agreed with the findings in the report. Nm Pet Image Initial (pi) Skull Base To Thigh  Result Date: 03/13/2017 CLINICAL DATA:  Subsequent treatment strategy for lymphoma. EXAM: NUCLEAR MEDICINE PET SKULL BASE TO THIGH TECHNIQUE: 10.1 mCi F-18 FDG was injected intravenously. Full-ring PET imaging was performed from the skull base to thigh after the radiotracer. CT data was obtained and used for attenuation correction and anatomic localization. FASTING BLOOD GLUCOSE:  Value: 119 mg/dl COMPARISON:  12/12/2016 FINDINGS: For internal reference: Blood pool activity within the aorta has an SUV max of 2.89. Activity within the liver has an SUV max of 3.51. NECK: No hypermetabolic lymph nodes in the neck. CHEST: Mild  cardiac enlargement. Aortic atherosclerosis. Calcification in the LAD and left circumflex and RCA coronary arteries noted. Significantly diminished size of previously enlarged left internal mammary lymph node. SUV max currently 1.77, Deauville criteria 2. ABDOMEN/PELVIS: There is no abnormal uptake within the liver, pancreas, spleen  or adrenal glands. Previously identified complex lesion arising from the posterior aspect of the interpolar right kidney measures 3.1 cm and 6 HU. Previously this measured 3.1 cm and 51 HU (postcontrast). Left retroperitoneal soft tissue nodularity measures 1.2 by 1.7 cm and has an SUV max equal to 2.88, Deauville criteria 2 compatible with treated tumor. Previously this measured 4.2 by 3.3. Soft tissue mass within the central mesenteric measures 4.4 x 2.8 cm and has an SUV max equal to 3.4, Deauville criteria 3 (compatible with treated tumor). Previously this measured 5.7 x 6.5 cm. Right lower quadrant soft tissue infiltration measures 4.1 by 1.6 cm and has an SUV max equal to 3.03, Deauville criteria 3 (compatible with treated tumor). Previously 8.0 x 3.7 cm. SKELETON: Mildly diffuse increased uptake throughout the bone marrow of the axial and appendicular skeleton is identified. Nonspecific. Left lateral chest wall mass involving the lateral aspect of the left fifth rib has nearly completely resolved with residual pathologic fracture of the rib. SUV max is equal to 3.1, Deauville criteria 3. The previously identified paraspinal soft tissue mass involving the eighth rib has significantly diminished in size in the interval. SUV max within this area is equal to 2.97,Deauville 3. IMPRESSION: 1. Low level FDG uptake associated with previous areas of soft tissue tumor within the chest and abdomen. Deauville criteria 2 and 3 compatible with treated tumor. No sites of residual metabolically active tumor (Deauville criteria 4 or 5) identified. 2. Mild diffuse uptake throughout the bone marrow is noted which is nonspecific in may reflect treatment related changes. 3. Stable size of previously noted complex lesion arising from the posterior cortex of the right kidney which now measures fluid attenuation. This is incompletely characterized on today's given lack of IV contrast material. 4. Aortic Atherosclerosis  (ICD10-I70.0). Multi vessel coronary artery calcifications noted. Electronically Signed   By: Kerby Moors M.D.   On: 03/13/2017 10:22   Dg Fluoro Guided Needle Plc Aspiration/injection Loc  Result Date: 02/27/2017 CLINICAL DATA:  Diffuse large B-cell lymphoma EXAM: FLUOROSCOPICALLY GUIDED LUMBAR PUNCTURE FOR INTRATHECAL CHEMOTHERAPY TECHNIQUE: Informed consent was obtained from the patient prior to the procedure, including potential complications of headache, allergy, and pain. A 'time out' was performed. With the patient prone, the lower back was prepped with Betadine. 1% Lidocaine was used for local anesthesia. Lumbar puncture was performed at the L3-4 using a 20 gauge needle with return of clear CSF. 12 milligrams in a total of 5 mm volume of methotrexate was injected into the subarachnoid space. The patient tolerated the procedure well without apparent complication. FLUOROSCOPY TIME:  19 sec IMPRESSION: Intrathecal injection of chemotherapy without complication Electronically Signed   By: Rolm Baptise M.D.   On: 02/27/2017 12:05    ASSESSMENT & PLAN:   Diffuse large B cell lymphoma ([place]) She has side effects from treatment including constipation and then recently diarrhea, fatigue and severe anemia Overall, her symptoms aretolerable She is admitted todaycycle 4 ofR-EPOCHchemotherapy with dose adjustment due to her side effects.  She will receive rituximab on day 3 and IT chemotherapy on day 4 She will return on 03/31/17 for G-CSF support Recent PET/CT scan showed positive response to treatment The risks, benefits, side effects of chemotherapy are  discussed with the patient and she agreed to proceed  Anemia due to antineoplastic chemotherapy She does not need blood transfusion Monitor closely  History of chronic constipation I would continue aggressive laxative therapy  CODE STATUS Full code  DVT prophylaxis Lovenox  Physical weakness secondary to thoracic surgery I  will consult physical therapy.  Discharge planning At the end of the week  All questions were answered. The patient knows to call the clinic with any problems, questions or concerns.     Heath Lark, MD 03/24/2017 10:48 AM

## 2017-03-24 NOTE — Evaluation (Signed)
Physical Therapy Evaluation Patient Details Name: Katrina [name] MRN: [id number] DOB: [birth year] Today's Date: 03/24/2017   History of Present Illness  Patient is a 67 y/o female with diffuse large B-cell lymphoma admitted forcycle4ofR-EPOCHchemotherapy. She will receive rituximab on day 3 and IT chemotherapy on day 4  Clinical Impression  Patient presents with decreased independence with mobility due to weakness, decreased balance and poor activity tolerance.  She was receiving HHPT prior to admission and participating with daily HEP.  Currently min A for transfers for safety and side steps to [facility] after toileting on BSC with min A.  She will benefit from skilled PT in the acute setting to allow return home with family support and follow up HHPT, currently has all equipment needed.     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 and weakness Other Brace/Splint: evidently has L knee brace due to hyperextension      Mobility  Bed Mobility   Bed Mobility: Sidelying to Sit;Sit to Sidelying Rolling: Supervision Sidelying to sit: Supervision     Sit to sidelying: Min assist General bed mobility comments: assist for legs onto bed  Transfers Overall transfer level: Needs assistance Equipment used: Rolling walker (2 wheeled) Transfers: Sit to/from Stand Sit to Stand: Supervision;Min guard         General transfer comment: assist for safetty/balance, cues for hand placement  Ambulation/Gait Ambulation/Gait assistance: Min assist Ambulation Distance (Feet): 4 Feet Assistive device: Rolling walker (2 wheeled) Gait Pattern/deviations: Step-through pattern;Decreased stride length;Wide base of support;Trunk flexed     General Gait Details: in  room only due to recieving chamo at this time, side steps to HOB min A for balance  Stairs            Wheelchair Mobility    Modified Rankin (Stroke Patients Only)       Balance Overall balance assessment: Needs assistance Sitting-balance support: Feet supported Sitting balance-Leahy Scale: Good     Standing balance support: Bilateral upper extremity supported;During functional activity Standing balance-Leahy Scale: Poor Standing balance comment: UE support needed in standing                             Pertinent Vitals/Pain Pain Assessment: Faces Faces Pain Scale: Hurts a little bit Pain Location: legs with sensory changes Pain Descriptors / Indicators: Tender Pain Intervention(s): Monitored during session;Repositioned    Home Living Family/patient expects to be discharged to:: Private residence Living Arrangements: Children Available Help at Discharge: Family;Available 24 hours/day Type of Home: House Home Access: Ramped entrance Entrance Stairs-Rails: Right   Home Layout: One level Home Equipment: Wheelchair - manual;Grab bars - tub/shower;Hand held shower head Additional Comments: staying with son and able to have 24/7 care    Prior Function Level of Independence: Needs assistance   Gait / Transfers Assistance Needed: walks with RW short distances with supervision and uses WC otherwise  ADL's / Homemaking Assistance Needed: family to assist        Hand Dominance   Dominant Hand: Right    Extremity/Trunk Assessment   Upper Extremity Assessment Upper Extremity Assessment: Defer to OT evaluation    Lower Extremity Assessment RLE Deficits / Details: AROM WFL, strength hip flexion 3-/5, knee extension 4-/5, ankle DF 3-/5  RLE Sensation: decreased light touch;decreased proprioception LLE Deficits / Details: AROM WFL, strength hip flexion 3/5, knee extension 4/5, ankle DF 3-/5 LLE Sensation: decreased light touch;decreased proprioception        Communication   Communication: No difficulties  Cognition Arousal/Alertness: Awake/alert Behavior During Therapy: WFL for tasks assessed/performed Overall Cognitive Status: Within Functional Limits for tasks assessed                                        General Comments      Exercises     Assessment/Plan    PT Assessment Patient needs continued PT services  PT Problem List Decreased strength;Decreased mobility;Decreased activity tolerance;Decreased balance;Decreased knowledge of use of DME;Impaired sensation;Decreased knowledge of precautions       PT Treatment Interventions DME instruction;Balance training;Patient/family education    PT Goals (Current goals can be found in the Care Plan section)       Frequency Min 3X/week   Barriers to discharge        Co-evaluation               AM-PAC PT "6 Clicks" Daily Activity  Outcome Measure Difficulty turning over in bed (including adjusting bedclothes, sheets and blankets)?: A Little Difficulty moving from lying on back to sitting on the side of the bed? : Unable Difficulty sitting down on and standing up from a chair with arms (e.g., wheelchair, bedside commode, etc,.)?: Unable Help needed moving to and from a bed to chair (including a wheelchair)?: A Little Help needed walking in hospital room?: A Little Help needed climbing 3-5 steps with a railing? : A Lot 6 Click Score: 13    End of Session Equipment Utilized During Treatment: Gait belt Activity Tolerance: Patient limited by fatigue Patient left: in bed;with call bell/phone within reach Nurse Communication: Mobility status PT Visit Diagnosis: Unsteadiness on feet (R26.81);Muscle weakness (generalized) (M62.81)    Time: 2482-5003 PT Time Calculation (min) (ACUTE ONLY): 23 min   Charges:   PT Evaluation $PT Eval Moderate Complexity: 1 Mod PT Treatments $Therapeutic Activity: 8-22 mins   PT G CodesMagda [name],  Katrina [name] 03/24/2017   Katrina [name] 03/24/2017, 3:12 PM

## 2017-03-25 ENCOUNTER — Other Ambulatory Visit: Payer: Self-pay

## 2017-03-25 MED ORDER — LACTULOSE 10 GM/15ML PO SOLN
10.0000 g | Freq: Three times a day (TID) | ORAL | Status: DC
  Administered 2017-03-25 (×3): 10 g via ORAL
  Filled 2017-03-25 (×4): qty 15

## 2017-03-25 MED ORDER — VINCRISTINE SULFATE CHEMO INJECTION 1 MG/ML
Freq: Once | INTRAVENOUS | Status: AC
  Administered 2017-03-25: 12:00:00 via INTRAVENOUS
  Filled 2017-03-25: qty 8

## 2017-03-25 MED ORDER — SODIUM CHLORIDE 0.9 % IV SOLN
Freq: Once | INTRAVENOUS | Status: AC
  Administered 2017-03-25: 18 mg via INTRAVENOUS
  Filled 2017-03-25: qty 4

## 2017-03-25 NOTE — Progress Notes (Signed)
Initial Nutrition Assessment  DOCUMENTATION CODES:   Obesity unspecified  INTERVENTION:   Magic cup TID with meals, each supplement provides 290 kcal and 9 grams of protein  NUTRITION DIAGNOSIS:   Increased nutrient needs related to cancer and cancer related treatments as evidenced by estimated needs.  GOAL:   Patient will meet greater than or equal to 90% of their needs  MONITOR:   PO intake, Weight trends, Labs, Supplement acceptance  REASON FOR ASSESSMENT:   Malnutrition Screening Tool    ASSESSMENT:   Pt with PMH significant for HTN and large B cell lymphoma. Presents this admission for high dose inpatient chemotherapy.    11/29- cycle one chemo 12/17- cycle two chemo 1/24- cycle 3 chemo  Spoke with pt at bedside. Reports having fair appetite prior to admission, typically consuming 3 meals per day. Endorses taste changes specifically when consuming chicken and ground meats. Pt states her appetite has increased even further this admission. Pt had hard boiled eggs, bacon, and milk this morning without any complications. Pt does not wish to try Ensure/Boost as the taste "vitamin-y." Amendable to [employer]. Records indicate pt weighed 256 lb 11/2016 and 197 lb this admission. This shows a 23% wt loss in four months. Significant for time frame. Nutrition-Focused physical exam completed. Pt at high risk for malnutrition if weight loss continues and appetite decreases.   Medications reviewed and include: lactulose Labs reviewed: K 3.1 (L)   NUTRITION - FOCUSED PHYSICAL EXAM:    Most Recent Value  Orbital Region  No depletion  Upper Arm Region  No depletion  Thoracic and Lumbar Region  No depletion  Buccal Region  No depletion  Temple Region  No depletion  Clavicle Bone Region  No depletion  Clavicle and Acromion Bone Region  No depletion  Scapular Bone Region  No depletion  Dorsal Hand  No depletion  Patellar Region  No depletion  Anterior Thigh Region  No depletion   Posterior Calf Region  No depletion  Edema (RD Assessment)  Mild  Hair  Reviewed  Eyes  Reviewed  Mouth  Reviewed  Skin  Reviewed  Nails  Reviewed     Diet Order:  Diet regular Room service appropriate? Yes; Fluid consistency: Thin  EDUCATION NEEDS:   Education needs have been addressed  Skin:  Skin Assessment: Reviewed RN Assessment  Last BM:  03/22/17  Height:   Ht Readings from Last 1 Encounters:  03/24/17 5\' 8"  (1.727 m)    Weight:   Wt Readings from Last 1 Encounters:  03/24/17 197 lb 15.6 oz (89.8 kg)    Ideal Body Weight:  63.6 kg  BMI:  Body mass index is 30.1 kg/m.  Estimated Nutritional Needs:   Kcal:  1650-1850 kcal/day  Protein:  80-90 g/day  Fluid:  >1.6 L/day    Mariana Single RD, LDN Clinical Nutrition Pager # - [phone]

## 2017-03-25 NOTE — Progress Notes (Signed)
Calculation and dosage of Carboplatin, Etoposide and vincristine verified by Legrand Rams ,RN

## 2017-03-25 NOTE — Progress Notes (Signed)
Katrina [name]   DOB:1950-09-06   UT#:[id number]    Subjective: She is doing well.  No bowel movement for several days.  No nausea or vomiting.  Objective:  Vitals:   03/24/17 2235 03/25/17 0549  BP: 115/71 133/77  Pulse: 90 90  Resp: 18 18  Temp: 98.7 F (37.1 C) (!) 97.4 F (36.3 C)  SpO2: 94% 93%     Intake/Output Summary (Last 24 hours) at 03/25/2017 0901 Last data filed at 03/25/2017 0319 Gross per 24 hour  Intake 646.08 ml  Output 750 ml  Net -103.92 ml    GENERAL:alert, no distress and comfortable SKIN: skin color, texture, turgor are normal, no rashes or significant lesions EYES: normal, Conjunctiva are pink and non-injected, sclera clear OROPHARYNX:no exudate, no erythema and lips, buccal mucosa, and tongue normal  NECK: supple, thyroid normal size, non-tender, without nodularity LYMPH:  no palpable lymphadenopathy in the cervical, axillary or inguinal LUNGS: clear to auscultation and percussion with normal breathing effort HEART: regular rate & rhythm and no murmurs and no lower extremity edema ABDOMEN:abdomen soft, non-tender and normal bowel sounds Musculoskeletal:no cyanosis of digits and no clubbing  NEURO: alert & oriented x 3 with fluent speech, no focal motor/sensory deficits   Labs:  Lab Results  Component Value Date   WBC 8.0 03/21/2017   HGB 10.1 (L) 03/21/2017   HCT 31.6 (L) 03/21/2017   MCV 90.5 03/21/2017   PLT 378 03/21/2017   NEUTROABS 6.0 03/21/2017    Lab Results  Component Value Date   NA 138 03/21/2017   K 3.1 (L) 03/21/2017   CL 96 (L) 03/21/2017   CO2 27 03/21/2017    Assessment & Plan:   Diffuse large B cell lymphoma ([place]) She has side effects from treatment including constipation and then recently diarrhea, fatigue and severe anemia Overall, her symptoms aretolerable She is admitted for cycle4ofR-EPOCHchemotherapy with dose adjustment due to her side effects. She will receive rituximab on day 3 and IT chemotherapy on day  4 She will return on 2/11/19for G-CSF support Recent PET/CT scan showed positive response to treatment The risks, benefits, side effects of chemotherapy are discussed with the patient and she agreed to proceed  Anemia due to antineoplastic chemotherapy She does not need blood transfusion Monitor closely  History of chronic constipation I would continue aggressive laxative therapy  CODE STATUS Full code  DVT prophylaxis Lovenox  Physical weakness secondary to thoracic surgery Continue physical therapy.  Discharge planning At the end of the week    Heath Lark, MD 03/25/2017  9:01 AM

## 2017-03-25 NOTE — Progress Notes (Signed)
HHPT recommended by PT during hospital eval. Pt had [facility] for HHPT , but has been discharged from their services. She would need new HHPT orders for a new referral if she needs continued HH. Will await MD orders for HHPT. Marney Doctor RN,BSN,NCM [phone]

## 2017-03-26 LAB — COMPREHENSIVE METABOLIC PANEL
ALBUMIN: 3.2 g/dL — AB (ref 3.5–5.0)
ALT: 13 U/L — AB (ref 14–54)
AST: 23 U/L (ref 15–41)
Alkaline Phosphatase: 45 U/L (ref 38–126)
Anion gap: 8 (ref 5–15)
BUN: 15 mg/dL (ref 6–20)
CHLORIDE: 102 mmol/L (ref 101–111)
CO2: 27 mmol/L (ref 22–32)
CREATININE: 0.76 mg/dL (ref 0.44–1.00)
Calcium: 8.8 mg/dL — ABNORMAL LOW (ref 8.9–10.3)
GFR calc Af Amer: >60 mL/min (ref >60)
GFR calc non Af Amer: >60 mL/min (ref >60)
Glucose, Bld: 99 mg/dL (ref 65–99)
POTASSIUM: 3.1 mmol/L — AB (ref 3.5–5.1)
SODIUM: 137 mmol/L (ref 135–145)
Total Bilirubin: 0.4 mg/dL (ref 0.3–1.2)
Total Protein: 5.6 g/dL — ABNORMAL LOW (ref 6.5–8.1)

## 2017-03-26 LAB — CBC WITH DIFFERENTIAL/PLATELET
BASOS ABS: 0 10*3/uL (ref 0.0–0.1)
BASOS PCT: 0 %
EOS ABS: 0 10*3/uL (ref 0.0–0.7)
EOS PCT: 0 %
HCT: 24.7 % — ABNORMAL LOW (ref 36.0–46.0)
Hemoglobin: 8.2 g/dL — ABNORMAL LOW (ref 12.0–15.0)
LYMPHS PCT: 13 %
Lymphs Abs: 0.8 10*3/uL (ref 0.7–4.0)
MCH: 29.4 pg (ref 26.0–34.0)
MCHC: 33.2 g/dL (ref 30.0–36.0)
MCV: 88.5 fL (ref 78.0–100.0)
Monocytes Absolute: 0.4 10*3/uL (ref 0.1–1.0)
Monocytes Relative: 6 %
Neutro Abs: 5.1 10*3/uL (ref 1.7–7.7)
Neutrophils Relative %: 81 %
PLATELETS: 279 10*3/uL (ref 150–400)
RBC: 2.79 MIL/uL — AB (ref 3.87–5.11)
RDW: 16.3 % — AB (ref 11.5–15.5)
WBC: 6.3 10*3/uL (ref 4.0–10.5)

## 2017-03-26 MED ORDER — SODIUM CHLORIDE 0.9% FLUSH: 3.0000 mL | INTRAVENOUS | Status: DC | PRN

## 2017-03-26 MED ORDER — VINCRISTINE SULFATE CHEMO INJECTION 1 MG/ML
Freq: Once | INTRAVENOUS | Status: AC
  Administered 2017-03-26: 15:00:00 via INTRAVENOUS
  Filled 2017-03-26: qty 8

## 2017-03-26 MED ORDER — ACETAMINOPHEN 325 MG PO TABS
650.0000 mg | ORAL_TABLET | Freq: Once | ORAL | Status: AC
  Administered 2017-03-26: 650 mg via ORAL
  Filled 2017-03-26: qty 2

## 2017-03-26 MED ORDER — FAMOTIDINE IN NACL 20-0.9 MG/50ML-% IV SOLN
20.0000 mg | Freq: Once | INTRAVENOUS | Status: DC | PRN
  Filled 2017-03-26: qty 50

## 2017-03-26 MED ORDER — EPINEPHRINE PF 1 MG/ML IJ SOLN
0.5000 mg | Freq: Once | INTRAMUSCULAR | Status: DC | PRN
  Filled 2017-03-26: qty 1

## 2017-03-26 MED ORDER — ALTEPLASE 2 MG IJ SOLR
2.0000 mg | Freq: Once | INTRAMUSCULAR | Status: DC | PRN
  Filled 2017-03-26: qty 2

## 2017-03-26 MED ORDER — SODIUM CHLORIDE 0.9% FLUSH: 10.0000 mL | INTRAVENOUS | Status: DC | PRN

## 2017-03-26 MED ORDER — SODIUM CHLORIDE 0.9 % IV SOLN
Freq: Once | INTRAVENOUS | Status: AC
  Administered 2017-03-26: 8 mg via INTRAVENOUS
  Filled 2017-03-26: qty 4

## 2017-03-26 MED ORDER — SODIUM CHLORIDE 0.9 % IV SOLN: Freq: Once | INTRAVENOUS | Status: DC | PRN

## 2017-03-26 MED ORDER — DIPHENHYDRAMINE HCL 50 MG PO CAPS
50.0000 mg | ORAL_CAPSULE | Freq: Once | ORAL | Status: AC
  Administered 2017-03-26: 50 mg via ORAL
  Filled 2017-03-26: qty 1

## 2017-03-26 MED ORDER — EPINEPHRINE PF 1 MG/10ML IJ SOSY: 0.2500 mg | PREFILLED_SYRINGE | Freq: Once | INTRAMUSCULAR | Status: DC | PRN

## 2017-03-26 MED ORDER — HEPARIN SOD (PORK) LOCK FLUSH 100 UNIT/ML IV SOLN
500.0000 [IU] | Freq: Once | INTRAVENOUS | Status: DC | PRN
  Filled 2017-03-26: qty 5

## 2017-03-26 MED ORDER — SODIUM CHLORIDE 0.9 % IV SOLN
Freq: Once | INTRAVENOUS | Status: AC
  Administered 2017-03-26: 10:00:00 via INTRAVENOUS

## 2017-03-26 MED ORDER — SODIUM CHLORIDE 0.9 % IV SOLN
375.0000 mg/m2 | Freq: Once | INTRAVENOUS | Status: AC
  Administered 2017-03-26: 800 mg via INTRAVENOUS
  Filled 2017-03-26: qty 50

## 2017-03-26 MED ORDER — HEPARIN SOD (PORK) LOCK FLUSH 100 UNIT/ML IV SOLN: 250.0000 [IU] | Freq: Once | INTRAVENOUS | Status: DC | PRN

## 2017-03-26 MED ORDER — ALBUTEROL SULFATE (2.5 MG/3ML) 0.083% IN NEBU: 2.5000 mg | INHALATION_SOLUTION | Freq: Once | RESPIRATORY_TRACT | Status: DC | PRN

## 2017-03-26 MED ORDER — DIPHENHYDRAMINE HCL 50 MG/ML IJ SOLN: 25.0000 mg | Freq: Once | INTRAMUSCULAR | Status: DC | PRN

## 2017-03-26 MED ORDER — DIPHENHYDRAMINE HCL 50 MG/ML IJ SOLN: 50.0000 mg | Freq: Once | INTRAMUSCULAR | Status: DC | PRN

## 2017-03-26 MED ORDER — METHYLPREDNISOLONE SODIUM SUCC 125 MG IJ SOLR: 125.0000 mg | Freq: Once | INTRAMUSCULAR | Status: DC | PRN

## 2017-03-26 NOTE — Progress Notes (Signed)
Rituxan infusion complete, pt tolerated well.

## 2017-03-26 NOTE — Progress Notes (Signed)
Katrina [name]   DOB:1950-03-09   ZO#:[id number]    Subjective: Constipation has resolved, now with loose BM.  She denies side effects from treatment  Objective:  Vitals:   03/26/17 1200 03/26/17 1402  BP: (!) 143/88 134/73  Pulse: 87 96  Resp: 16 16  Temp: 97.6 F (36.4 C) 97.6 F (36.4 C)  SpO2: 100% 100%     Intake/Output Summary (Last 24 hours) at 03/26/2017 1501 Last data filed at 03/26/2017 1402 Gross per 24 hour  Intake 2085 ml  Output -  Net 2085 ml    GENERAL:alert, no distress and comfortable SKIN: skin color, texture, turgor are normal, no rashes or significant lesions EYES: normal, Conjunctiva are pink and non-injected, sclera clear OROPHARYNX:no exudate, no erythema and lips, buccal mucosa, and tongue normal  NECK: supple, thyroid normal size, non-tender, without nodularity LYMPH:  no palpable lymphadenopathy in the cervical, axillary or inguinal LUNGS: clear to auscultation and percussion with normal breathing effort HEART: regular rate & rhythm and no murmurs and no lower extremity edema ABDOMEN:abdomen soft, non-tender and normal bowel sounds Musculoskeletal:no cyanosis of digits and no clubbing  NEURO: alert & oriented x 3 with fluent speech, no focal motor/sensory deficits   Labs:  Lab Results  Component Value Date   WBC 6.3 03/26/2017   HGB 8.2 (L) 03/26/2017   HCT 24.7 (L) 03/26/2017   MCV 88.5 03/26/2017   PLT 279 03/26/2017   NEUTROABS 5.1 03/26/2017    Lab Results  Component Value Date   NA 137 03/26/2017   K 3.1 (L) 03/26/2017   CL 102 03/26/2017   CO2 27 03/26/2017   Assessment & Plan:   Diffuse large B cell lymphoma ([place]) She has side effects from treatment including constipation and then recently diarrhea, fatigue and severe anemia Overall, her symptoms aretolerable She is admitted for cycle4ofR-EPOCHchemotherapy with dose adjustment due to her side effects. She will receive rituximab on day 3 and IT chemotherapy on day 4 She  will return on2/11/19for G-CSF support Recent PET/CT scan showed positive response to treatment The risks, benefits, side effects of chemotherapy are discussed with the patient and she agreed to proceed  Anemia due to antineoplastic chemotherapy She does not need blood transfusion Monitor closely  History of chronicconstipation, resolved I would continue aggressive laxative therapy, will DC lactulose  CODE STATUS Full code  DVT prophylaxis Lovenox on hold for possible IT chemo  Hypokalemia Could be due to loose BM. Observe only  Physical weakness secondary to thoracic surgery Continue physical therapy.  Discharge planning At the end of the week   Heath Lark, MD 03/26/2017  3:01 PM

## 2017-03-26 NOTE — Progress Notes (Signed)
Rituxan dosage and calculation verified with Legrand Rams, RN.

## 2017-03-26 NOTE — Progress Notes (Signed)
EPOCH dosage and calculations verified with Legrand Rams, RN.

## 2017-03-27 ENCOUNTER — Inpatient Hospital Stay (HOSPITAL_COMMUNITY): Payer: Medicare Other

## 2017-03-27 MED ORDER — LIDOCAINE HCL 1 % IJ SOLN
INTRAMUSCULAR | Status: AC
  Filled 2017-03-27: qty 20

## 2017-03-27 MED ORDER — SODIUM CHLORIDE 0.9 % IJ SOLN
Freq: Once | INTRAMUSCULAR | Status: AC
  Administered 2017-03-27: 12:00:00 via INTRATHECAL
  Filled 2017-03-27: qty 0.48

## 2017-03-27 MED ORDER — VINCRISTINE SULFATE CHEMO INJECTION 1 MG/ML
Freq: Once | INTRAVENOUS | Status: AC
  Administered 2017-03-27: 15:00:00 via INTRAVENOUS
  Filled 2017-03-27: qty 8

## 2017-03-27 MED ORDER — SODIUM CHLORIDE 0.9 % IV SOLN
Freq: Once | INTRAVENOUS | Status: AC
  Administered 2017-03-27: 8 mg via INTRAVENOUS
  Filled 2017-03-27: qty 4

## 2017-03-27 NOTE — Progress Notes (Signed)
PT Cancellation Note  Patient Details Name: Katrina [name] MRN: [id number] DOB: 1950-10-13   Cancelled Treatment:    Reason Eval/Treat Not Completed: Patient at procedure or test/unavailable Attempted to perform skilled PT treatment, nursing staff reports patient is about to leave for spinal tap, then will need to be on bed rest for most of the afternoon following procedure. Plan to hold today due to procedure/likely bed rest, will attempt to return 03/28/17 as able.    Deniece Ree PT, DPT, CBIS  Supplemental Physical Therapist [facility]   Pager [phone]

## 2017-03-27 NOTE — Progress Notes (Signed)
Katrina [name]   DOB:August 10, 1950   JH#:[id number]    Subjective: She feels well.  No nausea.  She has normal bowel movement.  She is walking with a walker.  Objective:  Vitals:   03/26/17 2037 03/27/17 0435  BP: 133/84 (!) 158/95  Pulse: 85 81  Resp: 16 16  Temp: 97.8 F (36.6 C) 98.1 F (36.7 C)  SpO2: 96% 97%     Intake/Output Summary (Last 24 hours) at 03/27/2017 0945 Last data filed at 03/27/2017 0436 Gross per 24 hour  Intake 1774.78 ml  Output -  Net 1774.78 ml    GENERAL:alert, no distress and comfortable SKIN: skin color, texture, turgor are normal, no rashes or significant lesions EYES: normal, Conjunctiva are pink and non-injected, sclera clear OROPHARYNX:no exudate, no erythema and lips, buccal mucosa, and tongue normal  NECK: supple, thyroid normal size, non-tender, without nodularity LYMPH:  no palpable lymphadenopathy in the cervical, axillary or inguinal LUNGS: clear to auscultation and percussion with normal breathing effort HEART: regular rate & rhythm and no murmurs and no lower extremity edema ABDOMEN:abdomen soft, non-tender and normal bowel sounds Musculoskeletal:no cyanosis of digits and no clubbing  NEURO: alert & oriented x 3 with fluent speech, no focal motor/sensory deficits   Labs:  Lab Results  Component Value Date   WBC 6.3 03/26/2017   HGB 8.2 (L) 03/26/2017   HCT 24.7 (L) 03/26/2017   MCV 88.5 03/26/2017   PLT 279 03/26/2017   NEUTROABS 5.1 03/26/2017    Lab Results  Component Value Date   NA 137 03/26/2017   K 3.1 (L) 03/26/2017   CL 102 03/26/2017   CO2 27 03/26/2017   Assessment & Plan:  Diffuse large B cell lymphoma ([place]) She has side effects from treatment including constipation and then recently diarrhea, fatigue and severe anemia Overall, her symptoms aretolerable She is admittedforcycle4ofR-EPOCHchemotherapy with dose adjustment due to her side effects. She will receive rituximab on day 3 and IT chemotherapy on day  4 She will return on2/11/19for G-CSF support Recent PET/CT scan showed positive response to treatment The risks, benefits, side effects of chemotherapy are discussed with the patient and she agreed to proceed  Anemia due to antineoplastic chemotherapy She does not need blood transfusion Monitor closely  History of chronicconstipation, resolved I would continue aggressive laxative therapy  CODE STATUS Full code  DVT prophylaxis Lovenox on hold for IT chemo  Hypokalemia Could be due to loose BM. Observe only  Physical weakness secondary to thoracic surgery Continuephysical therapy.  Discharge planning Tomorrow after chemotherapy.  Heath Lark, MD 03/27/2017  9:45 AM

## 2017-03-27 NOTE — Care Management Important Message (Signed)
Important Message  Patient Details  Name: Katrina [name] MRN: [id number] Date of Birth: 04-30-1950   Medicare Important Message Given:  Yes    Kerin Salen 03/27/2017, 11:10 AMImportant Message  Patient Details  Name: Katrina [name] MRN: [id number] Date of Birth: 08-Dec-1950   Medicare Important Message Given:  Yes    Kerin Salen 03/27/2017, 11:10 AM

## 2017-03-27 NOTE — Procedures (Signed)
           Informed consent was obtained from the patient prior to the procedure, including potential complications of headache, allergy, and pain. With the patient prone, the lower back was prepped with Betadine. 1% Lidocaine was used for local anesthesia. Lumbar puncture was performed at the [L4-L5] level using a 22 gauge needle with return of [clear]CSF. [place] mg] of [methotrexate] and 5 mL saline was injected into the subarachnoid space. The patient tolerated the procedure well without apparent complication. IMPRESSION: [Subtle intrathecal chemotherapy administration.]

## 2017-03-28 LAB — TYPE AND SCREEN
ABO/RH(D): O NEG
ANTIBODY SCREEN: NEGATIVE

## 2017-03-28 LAB — COMPREHENSIVE METABOLIC PANEL
ALBUMIN: 3.1 g/dL — AB (ref 3.5–5.0)
ALT: 33 U/L (ref 14–54)
ANION GAP: 8 (ref 5–15)
AST: 65 U/L — ABNORMAL HIGH (ref 15–41)
Alkaline Phosphatase: 36 U/L — ABNORMAL LOW (ref 38–126)
BILIRUBIN TOTAL: 0.4 mg/dL (ref 0.3–1.2)
BUN: 9 mg/dL (ref 6–20)
CALCIUM: 8.3 mg/dL — AB (ref 8.9–10.3)
CO2: 25 mmol/L (ref 22–32)
Chloride: 106 mmol/L (ref 101–111)
Creatinine, Ser: 0.54 mg/dL (ref 0.44–1.00)
Glucose, Bld: 87 mg/dL (ref 65–99)
POTASSIUM: 2.8 mmol/L — AB (ref 3.5–5.1)
Sodium: 139 mmol/L (ref 135–145)
TOTAL PROTEIN: 5 g/dL — AB (ref 6.5–8.1)

## 2017-03-28 LAB — CBC WITH DIFFERENTIAL/PLATELET
BASOS PCT: 0 %
Basophils Absolute: 0 10*3/uL (ref 0.0–0.1)
Eosinophils Absolute: 0 10*3/uL (ref 0.0–0.7)
Eosinophils Relative: 1 %
HEMATOCRIT: 23.9 % — AB (ref 36.0–46.0)
Hemoglobin: 8.1 g/dL — ABNORMAL LOW (ref 12.0–15.0)
LYMPHS ABS: 0.5 10*3/uL — AB (ref 0.7–4.0)
Lymphocytes Relative: 13 %
MCH: 29.9 pg (ref 26.0–34.0)
MCHC: 33.9 g/dL (ref 30.0–36.0)
MCV: 88.2 fL (ref 78.0–100.0)
MONO ABS: 0 10*3/uL — AB (ref 0.1–1.0)
MONOS PCT: 1 %
NEUTROS ABS: 2.9 10*3/uL (ref 1.7–7.7)
Neutrophils Relative %: 85 %
Platelets: 241 10*3/uL (ref 150–400)
RBC: 2.71 MIL/uL — ABNORMAL LOW (ref 3.87–5.11)
RDW: 15.8 % — ABNORMAL HIGH (ref 11.5–15.5)
WBC: 3.5 10*3/uL — ABNORMAL LOW (ref 4.0–10.5)

## 2017-03-28 MED ORDER — POTASSIUM CHLORIDE CRYS ER 20 MEQ PO TBCR
20.0000 meq | EXTENDED_RELEASE_TABLET | Freq: Two times a day (BID) | ORAL | Status: DC
  Administered 2017-03-28: 20 meq via ORAL
  Filled 2017-03-28: qty 1

## 2017-03-28 MED ORDER — CYCLOPHOSPHAMIDE CHEMO INJECTION 1 GM
600.0000 mg/m2 | Freq: Once | INTRAMUSCULAR | Status: AC
  Administered 2017-03-28: 1240 mg via INTRAVENOUS
  Filled 2017-03-28: qty 62

## 2017-03-28 MED ORDER — POTASSIUM CHLORIDE CRYS ER 20 MEQ PO TBCR
40.0000 meq | EXTENDED_RELEASE_TABLET | Freq: Four times a day (QID) | ORAL | Status: DC
  Administered 2017-03-28: 40 meq via ORAL
  Filled 2017-03-28: qty 2

## 2017-03-28 MED ORDER — SODIUM CHLORIDE 0.9 % IV SOLN
Freq: Once | INTRAVENOUS | Status: AC
  Administered 2017-03-28: 16 mg via INTRAVENOUS
  Filled 2017-03-28: qty 8

## 2017-03-28 MED ORDER — POTASSIUM CHLORIDE CRYS ER 20 MEQ PO TBCR: 20.0000 meq | EXTENDED_RELEASE_TABLET | Freq: Three times a day (TID) | ORAL | Status: DC

## 2017-03-28 MED ORDER — POTASSIUM CHLORIDE CRYS ER 20 MEQ PO TBCR
20.0000 meq | EXTENDED_RELEASE_TABLET | Freq: Two times a day (BID) | ORAL | 0 refills | Status: DC
Start: 2017-03-28 — End: 2017-04-25

## 2017-03-28 NOTE — Discharge Summary (Signed)
Physician Discharge Summary  Patient ID: Katrina [name] MRN: [id number] DOB/AGE: April 13, 1950 67 y.o.  Admit date: 03/24/2017 Discharge date: 03/28/2017  Primary Care Physician:  Patient, No Pcp Per   Discharge Diagnoses:    Present on Admission: . Diffuse large B cell lymphoma ([place]) . Numbness and tingling of both lower extremities . Constipation . Anemia due to antineoplastic chemotherapy   Discharge Medications:  Allergies as of 03/28/2017      Reactions   Contrast Media [iodinated Diagnostic Agents] Hives, Itching      Medication List    TAKE these medications   acyclovir 400 MG tablet Commonly known as:  ZOVIRAX Take 1 tablet (400 mg total) by mouth 2 (two) times daily.   bisacodyl 5 MG EC tablet Commonly known as:  DULCOLAX Take 5 mg by mouth daily as needed for moderate constipation.   cyclobenzaprine 10 MG tablet Commonly known as:  FLEXERIL Take 1 tablet (10 mg total) by mouth 3 (three) times daily as needed for muscle spasms. What changed:  when to take this   Lactulose 20 GM/30ML Soln Take 30 mLs (20 g total) by mouth 3 (three) times daily.   lidocaine-prilocaine cream Commonly known as:  EMLA Apply 1 application topically as needed. What changed:  reasons to take this   morphine 15 MG 12 hr tablet Commonly known as:  MS CONTIN Take 1 tablet (15 mg total) by mouth every 12 (twelve) hours. What changed:    when to take this  reasons to take this   ondansetron 8 MG tablet Commonly known as:  ZOFRAN Take 1 tablet (8 mg total) by mouth every 8 (eight) hours as needed for nausea (not responsive to prochlorperazine (COMPAZINE)).   polyethylene glycol packet Commonly known as:  MIRALAX / GLYCOLAX Take 17 g by mouth daily as needed for mild constipation.   potassium chloride SA 20 MEQ tablet Commonly known as:  K-DUR,KLOR-CON Take 1 tablet (20 mEq total) by mouth 2 (two) times daily.   prochlorperazine 10 MG tablet Commonly known as:   COMPAZINE Take 1 tablet (10 mg total) by mouth every 6 (six) hours as needed for nausea or vomiting.   traMADol 50 MG tablet Commonly known as:  ULTRAM Take 2 tablets (100 mg total) by mouth every 6 (six) hours as needed for severe pain.   triamterene-hydrochlorothiazide 37.5-25 MG capsule Commonly known as:  DYAZIDE Take 1 capsule by mouth daily.       Disposition and Follow-up:   Significant Diagnostic Studies:  Nm Pet Image Initial (pi) Skull Base To Thigh  Result Date: 03/13/2017 CLINICAL DATA:  Subsequent treatment strategy for lymphoma. EXAM: NUCLEAR MEDICINE PET SKULL BASE TO THIGH TECHNIQUE: 10.1 mCi F-18 FDG was injected intravenously. Full-ring PET imaging was performed from the skull base to thigh after the radiotracer. CT data was obtained and used for attenuation correction and anatomic localization. FASTING BLOOD GLUCOSE:  Value: 119 mg/dl COMPARISON:  12/12/2016 FINDINGS: For internal reference: Blood pool activity within the aorta has an SUV max of 2.89. Activity within the liver has an SUV max of 3.51. NECK: No hypermetabolic lymph nodes in the neck. CHEST: Mild cardiac enlargement. Aortic atherosclerosis. Calcification in the LAD and left circumflex and RCA coronary arteries noted. Significantly diminished size of previously enlarged left internal mammary lymph node. SUV max currently 1.77, Deauville criteria 2. ABDOMEN/PELVIS: There is no abnormal uptake within the liver, pancreas, spleen or adrenal glands. Previously identified complex lesion arising from the posterior aspect of  the interpolar right kidney measures 3.1 cm and 6 HU. Previously this measured 3.1 cm and 51 HU (postcontrast). Left retroperitoneal soft tissue nodularity measures 1.2 by 1.7 cm and has an SUV max equal to 2.88, Deauville criteria 2 compatible with treated tumor. Previously this measured 4.2 by 3.3. Soft tissue mass within the central mesenteric measures 4.4 x 2.8 cm and has an SUV max equal to 3.4,  Deauville criteria 3 (compatible with treated tumor). Previously this measured 5.7 x 6.5 cm. Right lower quadrant soft tissue infiltration measures 4.1 by 1.6 cm and has an SUV max equal to 3.03, Deauville criteria 3 (compatible with treated tumor). Previously 8.0 x 3.7 cm. SKELETON: Mildly diffuse increased uptake throughout the bone marrow of the axial and appendicular skeleton is identified. Nonspecific. Left lateral chest wall mass involving the lateral aspect of the left fifth rib has nearly completely resolved with residual pathologic fracture of the rib. SUV max is equal to 3.1, Deauville criteria 3. The previously identified paraspinal soft tissue mass involving the eighth rib has significantly diminished in size in the interval. SUV max within this area is equal to 2.97,Deauville 3. IMPRESSION: 1. Low level FDG uptake associated with previous areas of soft tissue tumor within the chest and abdomen. Deauville criteria 2 and 3 compatible with treated tumor. No sites of residual metabolically active tumor (Deauville criteria 4 or 5) identified. 2. Mild diffuse uptake throughout the bone marrow is noted which is nonspecific in may reflect treatment related changes. 3. Stable size of previously noted complex lesion arising from the posterior cortex of the right kidney which now measures fluid attenuation. This is incompletely characterized on today's given lack of IV contrast material. 4. Aortic Atherosclerosis (ICD10-I70.0). Multi vessel coronary artery calcifications noted. Electronically Signed   By: Kerby Moors M.D.   On: 03/13/2017 10:22   Dg Fluoro Guided Needle Plc Aspiration/injection Loc  Result Date: 03/27/2017 CLINICAL DATA:  Diffuse large B-cell lymphoma. Intrathecal chemotherapy. Fourth treatment EXAM: FLUOROSCOPICALLY GUIDED LUMBAR PUNCTURE FOR INTRATHECAL CHEMOTHERAPY FLUOROSCOPY TIME:  1 minute 5 seconds.  43.6 mGy PROCEDURE: Informed consent was obtained from the patient prior to the  procedure, including potential complications of headache, allergy, and pain. With the patient prone, the lower back was prepped with Betadine. 1% Lidocaine was used for local anesthesia. Lumbar puncture was performed at the L4-L5 level using a 22 gauge needle with return of clearCSF. 12 mg of methotrexate and 5 mL saline was injected into the subarachnoid space. The patient tolerated the procedure well without apparent complication. IMPRESSION: Subtle intrathecal chemotherapy administration. Electronically Signed   By: Suzy Bouchard M.D.   On: 03/27/2017 12:55   Dg Fluoro Guided Needle Plc Aspiration/injection Loc  Result Date: 02/27/2017 CLINICAL DATA:  Diffuse large B-cell lymphoma EXAM: FLUOROSCOPICALLY GUIDED LUMBAR PUNCTURE FOR INTRATHECAL CHEMOTHERAPY TECHNIQUE: Informed consent was obtained from the patient prior to the procedure, including potential complications of headache, allergy, and pain. A 'time out' was performed. With the patient prone, the lower back was prepped with Betadine. 1% Lidocaine was used for local anesthesia. Lumbar puncture was performed at the L3-4 using a 20 gauge needle with return of clear CSF. 12 milligrams in a total of 5 mm volume of methotrexate was injected into the subarachnoid space. The patient tolerated the procedure well without apparent complication. FLUOROSCOPY TIME:  19 sec IMPRESSION: Intrathecal injection of chemotherapy without complication Electronically Signed   By: Rolm Baptise M.D.   On: 02/27/2017 12:05    Discharge Laboratory  Values: Lab Results  Component Value Date   WBC 3.5 (L) 03/28/2017   HGB 8.1 (L) 03/28/2017   HCT 23.9 (L) 03/28/2017   MCV 88.2 03/28/2017   PLT 241 03/28/2017   Lab Results  Component Value Date   NA 139 03/28/2017   K 2.8 (L) 03/28/2017   CL 106 03/28/2017   CO2 25 03/28/2017    Brief H and P: For complete details please refer to admission H and P, but in brief, she was admitted for cycle 4 of chemotherapy.     Physical Exam at Discharge: BP (!) 148/83 (BP Location: Left Arm)   Pulse 86   Temp (!) 97.5 F (36.4 C) (Oral)   Resp 16   Ht 5\' 8"  (1.727 m)   Wt 197 lb 15.6 oz (89.8 kg)   SpO2 98%   BMI 30.10 kg/m  GENERAL:alert, no distress and comfortable SKIN: skin color, texture, turgor are normal, no rashes or significant lesions EYES: normal, Conjunctiva are pink and non-injected, sclera clear OROPHARYNX:no exudate, no erythema and lips, buccal mucosa, and tongue normal  NECK: supple, thyroid normal size, non-tender, without nodularity LYMPH:  no palpable lymphadenopathy in the cervical, axillary or inguinal LUNGS: clear to auscultation and percussion with normal breathing effort HEART: regular rate & rhythm and no murmurs and no lower extremity edema ABDOMEN:abdomen soft, non-tender and normal bowel sounds Musculoskeletal:no cyanosis of digits and no clubbing  NEURO: alert & oriented x 3 with fluent speech, no focal motor/sensory deficits  Hospital Course:  Active Problems:   Diffuse large B cell lymphoma ([facility])   Numbness and tingling of both lower extremities   Constipation   Anemia due to antineoplastic chemotherapy  Diffuse large B cell lymphoma ([facility]) She has side effects from treatment including constipation and then recently diarrhea, fatigue and severe anemia Overall, her symptoms aretolerable She is admittedforcycle4ofR-EPOCHchemotherapy with dose adjustment due to her side effects. She has received rituximab on day 3 and IT chemotherapy on day 4 She will return on2/11/19for G-CSF support Recent PET/CT scan showed positive response to treatment  Anemia due to antineoplastic chemotherapy She does not need blood transfusion Monitor closely  History of chronicconstipation, resolved I would continue aggressive laxative therapy  Hypokalemia Could be due to loose BM. She will take potassium supplement  Physical weakness secondary to thoracic  surgery Continuephysical therapy.  Diet:  Regular  Activity:  As tolerated  Condition at Discharge:   stable  Signed: Dr. Heath Lark [phone]  03/28/2017, 8:17 AM  Spent 35 minutes on discharge

## 2017-03-28 NOTE — Progress Notes (Signed)
Physical Therapy Treatment Patient Details Name: Katrina [name] MRN: [id number] DOB: 1950-12-23 Today's Date: 03/28/2017    History of Present Illness Patient is a 67 y/o female with diffuse large B-cell lymphoma admitted [id number]-EPOCHchemotherapy. She will receive rituximab on day 3 and IT chemotherapy on day 4    PT Comments    Pt in bed currently receiving CHEMO.  Assisted OOB to amb to bathroom.  Unsteady, weak.  Too fatigued to attempt amb in hallway so assisted back to bed.  Pt plans to "rest up for today" stating she gets to go home later.    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 and weakness Other Brace/Splint: evidently has L knee brace due to hyperextension Restrictions Weight Bearing Restrictions: No    Mobility  Bed Mobility Overal bed mobility: Needs Assistance       Supine to sit: Supervision Sit to supine: Supervision   General bed mobility comments: increased time  Transfers Overall transfer level: Needs assistance Equipment used: Rolling walker (2 wheeled) Transfers: Sit to/from Stand Sit to Stand: Supervision;Min guard         General transfer comment: assist for safetty/balance, cues for hand placement plus increased time  Ambulation/Gait Ambulation/Gait assistance: Min guard;Supervision Ambulation Distance (Feet): 12 Feet(to and from bathroom only due to fatigue) Assistive device: Rolling walker (2 wheeled) Gait Pattern/deviations: Step-through pattern;Decreased stride length;Wide base of support;Trunk flexed Gait velocity: decreased   General Gait Details: amb to and from bathroom only due to max c/o weakness and gait instability "weakness"    Stairs            Wheelchair Mobility     Modified Rankin (Stroke Patients Only)       Balance                                            Cognition Arousal/Alertness: Awake/alert Behavior During Therapy: WFL for tasks assessed/performed Overall Cognitive Status: Within Functional Limits for tasks assessed                                        Exercises      General Comments        Pertinent Vitals/Pain Pain Assessment: Faces Faces Pain Scale: Hurts a little bit Pain Location: legs with sensory changes Pain Descriptors / Indicators: Tender Pain Intervention(s): Monitored during session    Home Living                      Prior Function            PT Goals (current goals can now be found in the care plan section) Progress towards PT goals: Progressing toward goals    Frequency    Min 3X/week      PT Plan Current plan remains appropriate    Co-evaluation              AM-PAC PT "6 Clicks" Daily Activity  Outcome Measure  Difficulty turning over in bed (including adjusting bedclothes, sheets and blankets)?: A Little Difficulty moving from  lying on back to sitting on the side of the bed? : A Little Difficulty sitting down on and standing up from a chair with arms (e.g., wheelchair, bedside commode, etc,.)?: A Little Help needed moving to and from a bed to chair (including a wheelchair)?: A Little Help needed walking in hospital room?: A Little Help needed climbing 3-5 steps with a railing? : A Lot 6 Click Score: 17    End of Session Equipment Utilized During Treatment: Gait belt Activity Tolerance: Patient limited by fatigue Patient left: in bed;with call bell/phone within reach Nurse Communication: Mobility status PT Visit Diagnosis: Unsteadiness on feet (R26.81);Muscle weakness (generalized) (M62.81)     Time: 0174-9449 PT Time Calculation (min) (ACUTE ONLY): 16 min  Charges:  $Gait Training: 8-22 mins                    G Codes:        Rica Koyanagi  PTA [facility]  Acute  Rehab Pager      [phone]

## 2017-03-28 NOTE — Progress Notes (Signed)
Cytoxan dosage and calculations verified with Laural Benes, RN.

## 2017-03-28 NOTE — Progress Notes (Signed)
Ctyoxan infusion complete, pt tolerated well.

## 2017-03-31 ENCOUNTER — Inpatient Hospital Stay: Payer: Medicare Other

## 2017-03-31 VITALS — BP 140/90 | HR 100 | Temp 98.2°F | Resp 18

## 2017-03-31 DIAGNOSIS — C8330 Diffuse large B-cell lymphoma, unspecified site: Secondary | ICD-10-CM

## 2017-03-31 DIAGNOSIS — R202 Paresthesia of skin: Secondary | ICD-10-CM | POA: Diagnosis not present

## 2017-03-31 DIAGNOSIS — R200 Anesthesia of skin: Secondary | ICD-10-CM | POA: Diagnosis not present

## 2017-03-31 DIAGNOSIS — Z5189 Encounter for other specified aftercare: Secondary | ICD-10-CM | POA: Diagnosis not present

## 2017-03-31 DIAGNOSIS — D6481 Anemia due to antineoplastic chemotherapy: Secondary | ICD-10-CM | POA: Diagnosis not present

## 2017-03-31 DIAGNOSIS — T451X5A Adverse effect of antineoplastic and immunosuppressive drugs, initial encounter: Secondary | ICD-10-CM | POA: Diagnosis not present

## 2017-03-31 MED ORDER — PEGFILGRASTIM INJECTION 6 MG/0.6ML ~~LOC~~
6.0000 mg | PREFILLED_SYRINGE | Freq: Once | SUBCUTANEOUS | Status: AC
  Administered 2017-03-31: 6 mg via SUBCUTANEOUS

## 2017-03-31 NOTE — Patient Instructions (Signed)
Pegfilgrastim injection What is this medicine? PEGFILGRASTIM (PEG fil gra stim) is a long-acting granulocyte colony-stimulating factor that stimulates the growth of neutrophils, a type of white blood cell important in the body's fight against infection. It is used to reduce the incidence of fever and infection in patients with certain types of cancer who are receiving chemotherapy that affects the bone marrow, and to increase survival after being exposed to high doses of radiation. This medicine may be used for other purposes; ask your health care provider or pharmacist if you have questions. COMMON BRAND NAME(S): Neulasta What should I tell my health care provider before I take this medicine? They need to know if you have any of these conditions: -kidney disease -latex allergy -ongoing radiation therapy -sickle cell disease -skin reactions to acrylic adhesives (On-Body Injector only) -an unusual or allergic reaction to pegfilgrastim, filgrastim, other medicines, foods, dyes, or preservatives -pregnant or trying to get pregnant -breast-feeding How should I use this medicine? This medicine is for injection under the skin. If you get this medicine at home, you will be taught how to prepare and give the pre-filled syringe or how to use the On-body Injector. Refer to the patient Instructions for Use for detailed instructions. Use exactly as directed. Tell your healthcare provider immediately if you suspect that the On-body Injector may not have performed as intended or if you suspect the use of the On-body Injector resulted in a missed or partial dose. It is important that you put your used needles and syringes in a special sharps container. Do not put them in a trash can. If you do not have a sharps container, call your pharmacist or healthcare provider to get one. Talk to your pediatrician regarding the use of this medicine in children. While this drug may be prescribed for selected conditions,  precautions do apply. Overdosage: If you think you have taken too much of this medicine contact a poison control center or emergency room at once. NOTE: This medicine is only for you. Do not share this medicine with others. What if I miss a dose? It is important not to miss your dose. Call your doctor or health care professional if you miss your dose. If you miss a dose due to an On-body Injector failure or leakage, a new dose should be administered as soon as possible using a single prefilled syringe for manual use. What may interact with this medicine? Interactions have not been studied. Give your health care provider a list of all the medicines, herbs, non-prescription drugs, or dietary supplements you use. Also tell them if you smoke, drink alcohol, or use illegal drugs. Some items may interact with your medicine. This list may not describe all possible interactions. Give your health care provider a list of all the medicines, herbs, non-prescription drugs, or dietary supplements you use. Also tell them if you smoke, drink alcohol, or use illegal drugs. Some items may interact with your medicine. What should I watch for while using this medicine? You may need blood work done while you are taking this medicine. If you are going to need a MRI, CT scan, or other procedure, tell your doctor that you are using this medicine (On-Body Injector only). What side effects may I notice from receiving this medicine? Side effects that you should report to your doctor or health care professional as soon as possible: -allergic reactions like skin rash, itching or hives, swelling of the face, lips, or tongue -dizziness -fever -pain, redness, or irritation at site   where injected -pinpoint red spots on the skin -red or dark-brown urine -shortness of breath or breathing problems -stomach or side pain, or pain at the shoulder -swelling -tiredness -trouble passing urine or change in the amount of urine Side  effects that usually do not require medical attention (report to your doctor or health care professional if they continue or are bothersome): -bone pain -muscle pain This list may not describe all possible side effects. Call your doctor for medical advice about side effects. You may report side effects to FDA at 1-800-FDA-1088. Where should I keep my medicine? Keep out of the reach of children. Store pre-filled syringes in a refrigerator between 2 and 8 degrees C (36 and 46 degrees F). Do not freeze. Keep in carton to protect from light. Throw away this medicine if it is left out of the refrigerator for more than 48 hours. Throw away any unused medicine after the expiration date. NOTE: This sheet is a summary. It may not cover all possible information. If you have questions about this medicine, talk to your doctor, pharmacist, or health care provider.  2018 [employer]vier/Gold Standard (2016-02-01 12:58:03)  

## 2017-04-01 ENCOUNTER — Other Ambulatory Visit: Payer: Self-pay

## 2017-04-01 ENCOUNTER — Other Ambulatory Visit: Payer: Self-pay | Admitting: Hematology and Oncology

## 2017-04-01 NOTE — Telephone Encounter (Signed)
Not sure we can call in to local pharmacy or not

## 2017-04-01 NOTE — Telephone Encounter (Signed)
LMOM for patient to discuss refill request for Triam/HCTZ 37.5-25 mg. Patient doesn't have PCP and Last seen with Dr. Naaman Plummer on 01/29/2017

## 2017-04-02 ENCOUNTER — Ambulatory Visit: Payer: Medicare Other | Admitting: Physical Medicine & Rehabilitation

## 2017-04-04 ENCOUNTER — Other Ambulatory Visit: Payer: Self-pay | Admitting: *Deleted

## 2017-04-04 DIAGNOSIS — I10 Essential (primary) hypertension: Secondary | ICD-10-CM

## 2017-04-04 MED ORDER — TRIAMTERENE-HCTZ 37.5-25 MG PO CAPS
1.0000 | ORAL_CAPSULE | Freq: Every day | ORAL | 1 refills | Status: DC
Start: 2017-04-04 — End: 2017-06-17

## 2017-04-17 ENCOUNTER — Other Ambulatory Visit: Payer: Self-pay | Admitting: Hematology and Oncology

## 2017-04-17 DIAGNOSIS — D6481 Anemia due to antineoplastic chemotherapy: Secondary | ICD-10-CM

## 2017-04-17 DIAGNOSIS — C8330 Diffuse large B-cell lymphoma, unspecified site: Secondary | ICD-10-CM

## 2017-04-17 DIAGNOSIS — T451X5A Adverse effect of antineoplastic and immunosuppressive drugs, initial encounter: Secondary | ICD-10-CM

## 2017-04-18 ENCOUNTER — Inpatient Hospital Stay: Payer: Medicare Other | Attending: Hematology and Oncology | Admitting: Hematology and Oncology

## 2017-04-18 ENCOUNTER — Encounter: Payer: Self-pay | Admitting: Hematology and Oncology

## 2017-04-18 ENCOUNTER — Other Ambulatory Visit: Payer: Self-pay | Admitting: Hematology and Oncology

## 2017-04-18 ENCOUNTER — Inpatient Hospital Stay: Payer: Medicare Other

## 2017-04-18 ENCOUNTER — Telehealth: Payer: Self-pay | Admitting: Hematology and Oncology

## 2017-04-18 DIAGNOSIS — T451X5A Adverse effect of antineoplastic and immunosuppressive drugs, initial encounter: Secondary | ICD-10-CM

## 2017-04-18 DIAGNOSIS — D6481 Anemia due to antineoplastic chemotherapy: Secondary | ICD-10-CM | POA: Diagnosis not present

## 2017-04-18 DIAGNOSIS — R200 Anesthesia of skin: Secondary | ICD-10-CM | POA: Diagnosis not present

## 2017-04-18 DIAGNOSIS — K5900 Constipation, unspecified: Secondary | ICD-10-CM | POA: Diagnosis not present

## 2017-04-18 DIAGNOSIS — C8330 Diffuse large B-cell lymphoma, unspecified site: Secondary | ICD-10-CM

## 2017-04-18 DIAGNOSIS — R202 Paresthesia of skin: Secondary | ICD-10-CM | POA: Insufficient documentation

## 2017-04-18 DIAGNOSIS — R110 Nausea: Secondary | ICD-10-CM | POA: Diagnosis not present

## 2017-04-18 DIAGNOSIS — Z5189 Encounter for other specified aftercare: Secondary | ICD-10-CM | POA: Insufficient documentation

## 2017-04-18 DIAGNOSIS — N289 Disorder of kidney and ureter, unspecified: Secondary | ICD-10-CM | POA: Insufficient documentation

## 2017-04-18 DIAGNOSIS — K5909 Other constipation: Secondary | ICD-10-CM

## 2017-04-18 DIAGNOSIS — G8220 Paraplegia, unspecified: Secondary | ICD-10-CM | POA: Insufficient documentation

## 2017-04-18 DIAGNOSIS — Z91041 Radiographic dye allergy status: Secondary | ICD-10-CM | POA: Insufficient documentation

## 2017-04-18 LAB — CBC WITH DIFFERENTIAL/PLATELET
Basophils Absolute: 0 10*3/uL (ref 0.0–0.1)
Basophils Relative: 1 %
EOS ABS: 0 10*3/uL (ref 0.0–0.5)
Eosinophils Relative: 0 %
HCT: 32.6 % — ABNORMAL LOW (ref 34.8–46.6)
HEMOGLOBIN: 10.4 g/dL — AB (ref 11.6–15.9)
Lymphocytes Relative: 14 %
Lymphs Abs: 0.8 10*3/uL — ABNORMAL LOW (ref 0.9–3.3)
MCH: 29.1 pg (ref 25.1–34.0)
MCHC: 31.9 g/dL (ref 31.5–36.0)
MCV: 91.3 fL (ref 79.5–101.0)
MONOS PCT: 16 %
Monocytes Absolute: 0.9 10*3/uL (ref 0.1–0.9)
NEUTROS PCT: 69 %
Neutro Abs: 3.9 10*3/uL (ref 1.5–6.5)
PLATELETS: 371 10*3/uL (ref 145–400)
RBC: 3.57 MIL/uL — ABNORMAL LOW (ref 3.70–5.45)
RDW: 15.3 % — ABNORMAL HIGH (ref 11.2–14.5)
WBC: 5.6 10*3/uL (ref 3.9–10.3)

## 2017-04-18 LAB — SAMPLE TO BLOOD BANK

## 2017-04-18 LAB — COMPREHENSIVE METABOLIC PANEL
ALK PHOS: 63 U/L (ref 40–150)
ALT: 13 U/L (ref 0–55)
ANION GAP: 12 — AB (ref 3–11)
AST: 28 U/L (ref 5–34)
Albumin: 3.9 g/dL (ref 3.5–5.0)
BUN: 13 mg/dL (ref 7–26)
CHLORIDE: 97 mmol/L — AB (ref 98–109)
CO2: 28 mmol/L (ref 22–29)
Calcium: 10.1 mg/dL (ref 8.4–10.4)
Creatinine, Ser: 1.33 mg/dL — ABNORMAL HIGH (ref 0.60–1.10)
GFR calc non Af Amer: 41 mL/min — ABNORMAL LOW (ref >60)
GFR, EST AFRICAN AMERICAN: 47 mL/min — AB (ref >60)
GLUCOSE: 115 mg/dL (ref 70–140)
POTASSIUM: 4.2 mmol/L (ref 3.5–5.1)
SODIUM: 137 mmol/L (ref 136–145)
Total Bilirubin: 0.4 mg/dL (ref 0.2–1.2)
Total Protein: 6.7 g/dL (ref 6.4–8.3)

## 2017-04-18 NOTE — Assessment & Plan Note (Signed)
She had recent chronic nausea secondary to chemotherapy and likely secondary to constipation She will continue antiemetics as needed

## 2017-04-18 NOTE — Assessment & Plan Note (Signed)
She tolerated last cycle of chemotherapy better with reduced dose and extra week of break I plan to admit her on 04/21/2017 for cycle 5 of chemotherapy She has completed 4 cycles of prophylactic IT chemotherapy.  She does not need further IT chemo in the future I plan to continue reduced dose chemo before.  She will receive rituximab on day 3 She will return to the cancer center on day 8 for G-CSF support I plan to give her 6 cycles of chemotherapy and then repeat imaging study

## 2017-04-18 NOTE — Telephone Encounter (Signed)
Gave avs and calendar for march and April

## 2017-04-18 NOTE — Progress Notes (Signed)
[facility] OFFICE PROGRESS NOTE  Patient Care Team: Patient, No Pcp Per as PCP - General (General Practice)  ASSESSMENT & PLAN:  Diffuse large B cell lymphoma ([place]) She tolerated last cycle of chemotherapy better with reduced dose and extra week of break I plan to admit her on 04/21/2017 for cycle 5 of chemotherapy She has completed 4 cycles of prophylactic IT chemotherapy.  She does not need further IT chemo in the future I plan to continue reduced dose chemo before.  She will receive rituximab on day 3 She will return to the cancer center on day 8 for G-CSF support I plan to give her 6 cycles of chemotherapy and then repeat imaging study  Anemia due to antineoplastic chemotherapy Her energy level is fair. We will continue reduced dose chemotherapy as before We will monitor closely  Constipation She continues to have problem with constipation I spent some time advising her to take laxatives on a regular basis to avoid constipation  Chronic nausea She had recent chronic nausea secondary to chemotherapy and likely secondary to constipation She will continue antiemetics as needed  Numbness and tingling of both lower extremities She has generalized weakness since recent chemotherapy She would continue home PT   No orders of the defined types were placed in this encounter.   INTERVAL HISTORY: Please see below for problem oriented charting. She returns with her son for further follow-up She had chronic nausea without vomiting since last time I saw her She also have severe constipation with no bowel movement for almost a week She just started taking lactulose recently but only taking small amount She denies recent falls She continue to work with physical therapy for her weakness She denies recent infection No recent cough, chest pain or shortness of breath.  SUMMARY OF ONCOLOGIC HISTORY:   Diffuse large B cell lymphoma ([place])   12/12/2016 Imaging    CT scan  showed  1. Extensive mesenteric, retroperitoneal, left hilar, mediastinal, left supraclavicular and left axillary adenopathy. Differential considerations include metastatic adenopathy, lymphoma and leukemia. The size of the nodes in the mesentery and retroperitoneum are suggestive of non-Hodgkin's lymphoma. 2. 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 Differential considerations include metastatic disease and lymphoma. A left upper lobe primary lung carcinoma is a possibility. 3. 3.2 cm solid right renal mass. Differential considerations include renal cell carcinoma and oncocytoma. 4. Small amount of free peritoneal fluid, most likely due to lymphatic obstruction by the mesenteric adenopathy. 5. Left fourth rib fracture. 6. Colonic diverticulosis. 7. Calcific coronary artery and aortic atherosclerosis. Aortic Atherosclerosis (ICD10-I70.0).       12/12/2016 - 12/14/2016 Hospital Admission    He was briefly admitted and evaluated for weakness.  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 Tumor fills the right neural foramina at T6-7 and T7-8 and completely replaces the T7 vertebral body.      12/18/2016 Surgery    She underwent procedure: Decompressive thoracic laminectomy from T5-T8 for resection of epidural tumor        12/18/2016 Pathology Results    1. Soft tissue mass, simple excision, thoracic dorsal epidural mass - DIFFUSE LARGE B-CELL LYMPHOMA. -  SEE ONCOLOGY TABLE. 2. Soft tissue mass, simple excision, thoracic dorsal epidural mass - DIFFUSE LARGE B-CELL LYMPHOMA. Microscopic Comment 1. LYMPHOMA Histologic type:  Non-Hodgkin B-cell lymphoma: Diffuse large B-cell lymphoma. Grade (if applicable): High grade. Flow cytometry: N/A. Immunohistochemical stains: CD20. CD3, CD5, CD10, CD23, CD30, CD15, PAX-5, Ki-67, bcl-2, bcl-6, CD34. Touch preps/imprints: N/A. Comments: There is a diffuse infiltrate of atypical lymphocytes. The lymphocytes are medium to large in size with irregular nuclear contours. There are background smaller lymphocytes. While there are admixed smaller cells, there are diffuse areas of large cells. The large lymphocytes are positive for CD20, CD10, bcl-6, and bcl-2. CD3 and CD5 highlight admixed T-cells. CD34, CD30, CD23, and CD15 are negative. PAX-5 is positive. Ki-67 is variable with areas up to 70%. 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       12/18/2016 - 01/17/2017 Hospital Admission    The patient was readmitted to the hospital due to complete weakness secondary to spinal cord compression.  She underwent surgery and pathology report confirmed diagnosis of diffuse large B-cell lymphoma. She received IT chemo on 01/16/17      01/16/2017 Pathology Results    CEREBROSPINAL FLUID (SPECIMEN 1 OF 1 COLLECTED 01/16/17): NO MALIGNANT CELLS IDENTIFIED.      02/03/2017 - 02/07/2017 Hospital Admission    She is admitted for cycle 2 of chemo      02/24/2017 - 02/28/2017 Hospital Admission    She is admitted for cycle 3 of chemotherapy      03/13/2017 PET scan    1. Low level FDG uptake associated with previous areas of soft tissue tumor within the chest and abdomen. Deauville criteria 2 and 3 compatible with treated tumor. No sites of residual metabolically active tumor (Deauville criteria 4 or 5) identified. 2. Mild diffuse uptake throughout the bone marrow is noted which is nonspecific in may reflect treatment related  changes. 3. Stable size of previously noted complex lesion arising from the posterior cortex of the right kidney which now measures fluid attenuation. This is incompletely characterized on today's given lack of IV contrast material. 4. Aortic Atherosclerosis (ICD10-I70.0). Multi vessel coronary artery calcifications noted.      03/13/2017 Imaging    Study Conclusions  - Procedure narrative: Transthoracic echocardiography. Image quality was suboptimal. The study was technically difficult, as a result of poor acoustic windows, poor sound wave transmission, chest wall deformity, and body habitus. - Left ventricle: GLLS is normal at -19% The cavity size was normal. There was mild focal basal hypertrophy of the septum. Systolic function was vigorous. The estimated ejection fraction was in the range of 65% to 70%. Wall motion was normal; there were no regional wall motion abnormalities. There was an increased relative contribution of atrial contraction to ventricular filling. Doppler parameters are consistent with abnormal left ventricular relaxation (grade 1 diastolic dysfunction). - Pulmonary arteries: Systolic pressure could not be accurately estimated.      03/24/2017 - 03/28/2017 Hospital Admission    The patient was admitted to the hospital for cycle 4 of chemotherapy       REVIEW OF SYSTEMS:   Constitutional: Denies fevers, chills or abnormal weight loss Eyes: Denies blurriness of vision Ears, nose, mouth, throat, and face: Denies mucositis or sore throat Respiratory: Denies cough, dyspnea or wheezes Cardiovascular: Denies palpitation, chest discomfort or lower extremity swelling Skin: Denies abnormal skin  rashes Lymphatics: Denies new lymphadenopathy or easy bruising Behavioral/Psych: Mood is stable, no new changes  All other systems were reviewed with the patient and are negative.  I have reviewed the past medical history, past surgical history, social history and family history with the  patient and they are unchanged from previous note.  ALLERGIES:  is allergic to contrast media [iodinated diagnostic agents].  MEDICATIONS:  Current Outpatient Medications  Medication Sig Dispense Refill  . acyclovir (ZOVIRAX) 400 MG tablet Take 1 tablet (400 mg total) by mouth 2 (two) times daily. 60 tablet 9  . bisacodyl (DULCOLAX) 5 MG EC tablet Take 5 mg by mouth daily as needed for moderate constipation.    . cyclobenzaprine (FLEXERIL) 10 MG tablet Take 1 tablet (10 mg total) by mouth 3 (three) times daily as needed for muscle spasms. (Patient taking differently: Take 10 mg by mouth 2 (two) times daily. ) 90 tablet 1  . Lactulose 20 GM/30ML SOLN Take 30 mLs (20 g total) by mouth 3 (three) times daily. 473 mL 1  . lidocaine-prilocaine (EMLA) cream Apply 1 application topically as needed. (Patient taking differently: Apply 1 application topically as needed (For port-a-cath.). ) 30 g 6  . morphine (MS CONTIN) 15 MG 12 hr tablet Take 1 tablet (15 mg total) by mouth every 12 (twelve) hours. (Patient taking differently: Take 15 mg by mouth every 12 (twelve) hours as needed for pain. ) 60 tablet 0  . ondansetron (ZOFRAN) 8 MG tablet Take 1 tablet (8 mg total) by mouth every 8 (eight) hours as needed for nausea (not responsive to prochlorperazine (COMPAZINE)). 60 tablet 1  . polyethylene glycol (MIRALAX / GLYCOLAX) packet Take 17 g by mouth daily as needed for mild constipation.    . potassium chloride SA (K-DUR,KLOR-CON) 20 MEQ tablet Take 1 tablet (20 mEq total) by mouth 2 (two) times daily. 14 tablet 0  . prochlorperazine (COMPAZINE) 10 MG tablet Take 1 tablet (10 mg total) by mouth every 6 (six) hours as needed for nausea or vomiting. 60 tablet 1  . traMADol (ULTRAM) 50 MG tablet TAKE 2 TABLETS BY MOUTH EVERY 6 HOURS AS NEEDED FOR SEVERE PAIN 90 tablet 0  . triamterene-hydrochlorothiazide (DYAZIDE) 37.5-25 MG capsule Take 1 each (1 capsule total) by mouth daily. 30 capsule 1   No current  facility-administered medications for this visit.     PHYSICAL EXAMINATION: ECOG PERFORMANCE STATUS: 2 - Symptomatic, <50% confined to bed  Vitals:   04/18/17 0956  BP: (!) 136/92  Pulse: (!) 103  Resp: 18  Temp: 98 F (36.7 C)  SpO2: 100%   Filed Weights   04/18/17 0956  Weight: 188 lb 12.8 oz (85.6 kg)    GENERAL:alert, no distress and comfortable SKIN: skin color, texture, turgor are normal, no rashes or significant lesions EYES: normal, Conjunctiva are pink and non-injected, sclera clear OROPHARYNX:no exudate, no erythema and lips, buccal mucosa, and tongue normal  NECK: supple, thyroid normal size, non-tender, without nodularity LYMPH:  no palpable lymphadenopathy in the cervical, axillary or inguinal LUNGS: clear to auscultation and percussion with normal breathing effort HEART: regular rate & rhythm and no murmurs and no lower extremity edema ABDOMEN:abdomen soft, non-tender and normal bowel sounds Musculoskeletal:no cyanosis of digits and no clubbing  NEURO: alert & oriented x 3 with fluent speech, no focal motor/sensory deficits  LABORATORY DATA:  I have reviewed the data as listed    Component Value Date/Time   NA 137 04/18/2017 0927   NA 134 (  L) 02/19/2017 0925   K 4.2 04/18/2017 0927   K 3.0 (LL) 02/19/2017 0925   CL 97 (L) 04/18/2017 0927   CO2 28 04/18/2017 0927   CO2 29 02/19/2017 0925   GLUCOSE 115 04/18/2017 0927   GLUCOSE 135 02/19/2017 0925   BUN 13 04/18/2017 0927   BUN 7.4 02/19/2017 0925   CREATININE 1.33 (H) 04/18/2017 0927   CREATININE 0.9 02/19/2017 0925   CALCIUM 10.1 04/18/2017 0927   CALCIUM 8.9 02/19/2017 0925   PROT 6.7 04/18/2017 0927   PROT 6.4 02/19/2017 0925   ALBUMIN 3.9 04/18/2017 0927   ALBUMIN 3.4 (L) 02/19/2017 0925   AST 28 04/18/2017 0927   AST 16 02/19/2017 0925   ALT 13 04/18/2017 0927   ALT 13 02/19/2017 0925   ALKPHOS 63 04/18/2017 0927   ALKPHOS 86 02/19/2017 0925   BILITOT 0.4 04/18/2017 0927   BILITOT 0.65  02/19/2017 0925   GFRNONAA 41 (L) 04/18/2017 0927   GFRAA 47 (L) 04/18/2017 0927    No results found for: SPEP, UPEP  Lab Results  Component Value Date   WBC 5.6 04/18/2017   NEUTROABS 3.9 04/18/2017   HGB 10.4 (L) 04/18/2017   HCT 32.6 (L) 04/18/2017   MCV 91.3 04/18/2017   PLT 371 04/18/2017      Chemistry      Component Value Date/Time   NA 137 04/18/2017 0927   NA 134 (L) 02/19/2017 0925   K 4.2 04/18/2017 0927   K 3.0 (LL) 02/19/2017 0925   CL 97 (L) 04/18/2017 0927   CO2 28 04/18/2017 0927   CO2 29 02/19/2017 0925   BUN 13 04/18/2017 0927   BUN 7.4 02/19/2017 0925   CREATININE 1.33 (H) 04/18/2017 0927   CREATININE 0.9 02/19/2017 0925      Component Value Date/Time   CALCIUM 10.1 04/18/2017 0927   CALCIUM 8.9 02/19/2017 0925   ALKPHOS 63 04/18/2017 0927   ALKPHOS 86 02/19/2017 0925   AST 28 04/18/2017 0927   AST 16 02/19/2017 0925   ALT 13 04/18/2017 0927   ALT 13 02/19/2017 0925   BILITOT 0.4 04/18/2017 0927   BILITOT 0.65 02/19/2017 0925       RADIOGRAPHIC STUDIES: I have personally reviewed the radiological images as listed and agreed with the findings in the report. Dg Fluoro Guided Needle Plc Aspiration/injection Loc  Result Date: 03/27/2017 CLINICAL DATA:  Diffuse large B-cell lymphoma. Intrathecal chemotherapy. Fourth treatment EXAM: FLUOROSCOPICALLY GUIDED LUMBAR PUNCTURE FOR INTRATHECAL CHEMOTHERAPY FLUOROSCOPY TIME:  1 minute 5 seconds.  43.6 mGy PROCEDURE: Informed consent was obtained from the patient prior to the procedure, including potential complications of headache, allergy, and pain. With the patient prone, the lower back was prepped with Betadine. 1% Lidocaine was used for local anesthesia. Lumbar puncture was performed at the L4-L5 level using a 22 gauge needle with return of clearCSF. 12 mg of methotrexate and 5 mL saline was injected into the subarachnoid space. The patient tolerated the procedure well without apparent complication.  IMPRESSION: Subtle intrathecal chemotherapy administration. Electronically Signed   By: Suzy Bouchard M.D.   On: 03/27/2017 12:55    All questions were answered. The patient knows to call the clinic with any problems, questions or concerns. No barriers to learning was detected.  I spent 25 minutes counseling the patient face to face. The total time spent in the appointment was 30 minutes and more than 50% was on counseling and review of test results  Heath Lark, MD 04/18/2017 10:56  AM

## 2017-04-18 NOTE — Assessment & Plan Note (Signed)
Her energy level is fair. We will continue reduced dose chemotherapy as before We will monitor closely

## 2017-04-18 NOTE — Assessment & Plan Note (Signed)
She has generalized weakness since recent chemotherapy She would continue home PT

## 2017-04-18 NOTE — Assessment & Plan Note (Signed)
She continues to have problem with constipation I spent some time advising her to take laxatives on a regular basis to avoid constipation

## 2017-04-21 ENCOUNTER — Encounter (HOSPITAL_COMMUNITY): Payer: Self-pay

## 2017-04-21 ENCOUNTER — Inpatient Hospital Stay (HOSPITAL_COMMUNITY)
Admission: RE | Admit: 2017-04-21 | Discharge: 2017-04-25 | DRG: 847 | Disposition: A | Discharge status: Home / Self Care | Payer: Medicare Other | Source: Ambulatory Visit | Attending: Hematology and Oncology | Admitting: Hematology and Oncology

## 2017-04-21 ENCOUNTER — Other Ambulatory Visit: Payer: Self-pay | Admitting: Hematology and Oncology

## 2017-04-21 ENCOUNTER — Other Ambulatory Visit: Payer: Self-pay

## 2017-04-21 DIAGNOSIS — Z800 Family history of malignant neoplasm of digestive organs: Secondary | ICD-10-CM

## 2017-04-21 DIAGNOSIS — G959 Disease of spinal cord, unspecified: Secondary | ICD-10-CM | POA: Diagnosis present

## 2017-04-21 DIAGNOSIS — Z5111 Encounter for antineoplastic chemotherapy: Principal | ICD-10-CM

## 2017-04-21 DIAGNOSIS — D6481 Anemia due to antineoplastic chemotherapy: Secondary | ICD-10-CM | POA: Diagnosis present

## 2017-04-21 DIAGNOSIS — I10 Essential (primary) hypertension: Secondary | ICD-10-CM | POA: Diagnosis present

## 2017-04-21 DIAGNOSIS — M4714 Other spondylosis with myelopathy, thoracic region: Secondary | ICD-10-CM | POA: Diagnosis present

## 2017-04-21 DIAGNOSIS — G992 Myelopathy in diseases classified elsewhere: Secondary | ICD-10-CM | POA: Diagnosis not present

## 2017-04-21 DIAGNOSIS — Z91041 Radiographic dye allergy status: Secondary | ICD-10-CM | POA: Diagnosis not present

## 2017-04-21 DIAGNOSIS — D630 Anemia in neoplastic disease: Secondary | ICD-10-CM

## 2017-04-21 DIAGNOSIS — C8580 Other specified types of non-Hodgkin lymphoma, unspecified site: Secondary | ICD-10-CM | POA: Diagnosis not present

## 2017-04-21 DIAGNOSIS — T451X5A Adverse effect of antineoplastic and immunosuppressive drugs, initial encounter: Secondary | ICD-10-CM | POA: Diagnosis present

## 2017-04-21 DIAGNOSIS — K5909 Other constipation: Secondary | ICD-10-CM

## 2017-04-21 DIAGNOSIS — C8330 Diffuse large B-cell lymphoma, unspecified site: Secondary | ICD-10-CM | POA: Diagnosis present

## 2017-04-21 MED ORDER — SODIUM CHLORIDE 0.9 % IV SOLN
Freq: Once | INTRAVENOUS | Status: AC
  Administered 2017-04-21: 8 mg via INTRAVENOUS
  Filled 2017-04-21: qty 4

## 2017-04-21 MED ORDER — DOCUSATE SODIUM 100 MG PO CAPS
100.0000 mg | ORAL_CAPSULE | Freq: Two times a day (BID) | ORAL | Status: DC
  Administered 2017-04-21 – 2017-04-25 (×7): 100 mg via ORAL
  Filled 2017-04-21 (×8): qty 1

## 2017-04-21 MED ORDER — TRAMADOL HCL 50 MG PO TABS
50.0000 mg | ORAL_TABLET | ORAL | Status: DC | PRN
  Administered 2017-04-21 – 2017-04-23 (×3): 50 mg via ORAL
  Filled 2017-04-21 (×3): qty 1

## 2017-04-21 MED ORDER — HEPARIN SOD (PORK) LOCK FLUSH 100 UNIT/ML IV SOLN: 250.0000 [IU] | Freq: Once | INTRAVENOUS | Status: DC | PRN

## 2017-04-21 MED ORDER — SENNOSIDES-DOCUSATE SODIUM 8.6-50 MG PO TABS: 1.0000 | ORAL_TABLET | Freq: Every evening | ORAL | Status: DC | PRN

## 2017-04-21 MED ORDER — LIDOCAINE-PRILOCAINE 2.5-2.5 % EX CREA: 1.0000 "application " | TOPICAL_CREAM | CUTANEOUS | Status: DC | PRN

## 2017-04-21 MED ORDER — SODIUM CHLORIDE 0.9 % IV SOLN: INTRAVENOUS | Status: DC

## 2017-04-21 MED ORDER — SODIUM CHLORIDE 0.9 % IV SOLN
8.0000 mg | Freq: Three times a day (TID) | INTRAVENOUS | Status: DC | PRN
  Filled 2017-04-21: qty 4

## 2017-04-21 MED ORDER — COLD PACK MISC ONCOLOGY
1.0000 | Freq: Once | Status: DC | PRN
  Filled 2017-04-21: qty 1

## 2017-04-21 MED ORDER — ACETAMINOPHEN 325 MG PO TABS
650.0000 mg | ORAL_TABLET | ORAL | Status: DC | PRN
  Administered 2017-04-24: 650 mg via ORAL
  Filled 2017-04-21: qty 2

## 2017-04-21 MED ORDER — SODIUM CHLORIDE 0.9% FLUSH: 3.0000 mL | INTRAVENOUS | Status: DC | PRN

## 2017-04-21 MED ORDER — ALTEPLASE 2 MG IJ SOLR: 2.0000 mg | Freq: Once | INTRAMUSCULAR | Status: DC | PRN

## 2017-04-21 MED ORDER — ONDANSETRON 4 MG PO TBDP: 4.0000 mg | ORAL_TABLET | Freq: Three times a day (TID) | ORAL | Status: DC | PRN

## 2017-04-21 MED ORDER — POLYETHYLENE GLYCOL 3350 17 G PO PACK
17.0000 g | PACK | Freq: Every day | ORAL | Status: DC
  Administered 2017-04-21 – 2017-04-25 (×5): 17 g via ORAL
  Filled 2017-04-21 (×5): qty 1

## 2017-04-21 MED ORDER — SENNA 8.6 MG PO TABS
1.0000 | ORAL_TABLET | Freq: Two times a day (BID) | ORAL | Status: DC
  Administered 2017-04-21 – 2017-04-25 (×8): 8.6 mg via ORAL
  Filled 2017-04-21 (×8): qty 1

## 2017-04-21 MED ORDER — ACYCLOVIR 400 MG PO TABS
400.0000 mg | ORAL_TABLET | Freq: Two times a day (BID) | ORAL | Status: DC
  Administered 2017-04-21 – 2017-04-25 (×9): 400 mg via ORAL
  Filled 2017-04-21 (×9): qty 1

## 2017-04-21 MED ORDER — BOOST PLUS PO LIQD
237.0000 mL | ORAL | Status: DC
  Administered 2017-04-21 – 2017-04-22 (×2): 237 mL via ORAL
  Filled 2017-04-21 (×5): qty 237

## 2017-04-21 MED ORDER — CYCLOBENZAPRINE HCL 10 MG PO TABS
10.0000 mg | ORAL_TABLET | Freq: Two times a day (BID) | ORAL | Status: DC
  Administered 2017-04-21 – 2017-04-25 (×9): 10 mg via ORAL
  Filled 2017-04-21 (×9): qty 1

## 2017-04-21 MED ORDER — VINCRISTINE SULFATE CHEMO INJECTION 1 MG/ML
Freq: Once | INTRAVENOUS | Status: AC
  Administered 2017-04-21: 13:00:00 via INTRAVENOUS
  Filled 2017-04-21: qty 8

## 2017-04-21 MED ORDER — ENOXAPARIN SODIUM 40 MG/0.4ML ~~LOC~~ SOLN
40.0000 mg | Freq: Every day | SUBCUTANEOUS | Status: DC
  Administered 2017-04-22 – 2017-04-25 (×4): 40 mg via SUBCUTANEOUS
  Filled 2017-04-21 (×4): qty 0.4

## 2017-04-21 MED ORDER — SODIUM CHLORIDE 0.9 % IV SOLN
INTRAVENOUS | Status: DC
  Administered 2017-04-21 – 2017-04-25 (×4): via INTRAVENOUS

## 2017-04-21 MED ORDER — ONDANSETRON HCL 4 MG PO TABS: 4.0000 mg | ORAL_TABLET | Freq: Three times a day (TID) | ORAL | Status: DC | PRN

## 2017-04-21 MED ORDER — ONDANSETRON HCL 4 MG/2ML IJ SOLN: 4.0000 mg | Freq: Three times a day (TID) | INTRAMUSCULAR | Status: DC | PRN

## 2017-04-21 MED ORDER — HEPARIN SOD (PORK) LOCK FLUSH 100 UNIT/ML IV SOLN: 500.0000 [IU] | Freq: Once | INTRAVENOUS | Status: DC | PRN

## 2017-04-21 MED ORDER — HOT PACK MISC ONCOLOGY
1.0000 | Freq: Once | Status: DC | PRN
  Filled 2017-04-21: qty 1

## 2017-04-21 MED ORDER — SODIUM CHLORIDE 0.9% FLUSH: 10.0000 mL | INTRAVENOUS | Status: DC | PRN

## 2017-04-21 NOTE — H&P (Signed)
[facility] [place] ADMISSION NOTE  Patient Care Team: Patient, No Pcp Per as PCP - General (General Practice)  CHIEF COMPLAINTS/PURPOSE OF ADMISSION Diffuse large B-cell lymphoma, admission for cycle 5 of high-dose chemotherapy  HISTORY OF PRESENTING ILLNESS:  Katrina [name] 67 y.o. female is admitted for high dose, inpatient chemotherapy Summary of oncologic history as follows:   Diffuse large B cell lymphoma ([place])   12/12/2016 Imaging    CT scan showed  1. Extensive mesenteric, retroperitoneal, left hilar, mediastinal, left supraclavicular and left axillary adenopathy. Differential considerations include metastatic adenopathy, lymphoma and leukemia. The size of the nodes in the mesentery and retroperitoneum are suggestive of non-Hodgkin's lymphoma. 2. 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 Differential considerations include metastatic disease and lymphoma. A left upper lobe primary lung carcinoma is a possibility. 3. 3.2 cm solid right renal mass. Differential considerations include renal cell carcinoma and oncocytoma. 4. Small amount of free peritoneal fluid, most likely due to lymphatic obstruction by the mesenteric adenopathy. 5. Left fourth rib fracture. 6. Colonic diverticulosis. 7. Calcific coronary artery and aortic atherosclerosis. Aortic Atherosclerosis (ICD10-I70.0).       12/12/2016 - 12/14/2016 Hospital Admission    He was briefly admitted and evaluated for weakness.  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 Tumor fills the right neural foramina at T6-7 and T7-8 and completely replaces the T7  vertebral body.      12/18/2016 Surgery    She underwent procedure: Decompressive thoracic laminectomy from T5-T8 for resection of epidural tumor        12/18/2016 Pathology Results    1. Soft tissue mass, simple excision, thoracic dorsal epidural mass - DIFFUSE LARGE B-CELL LYMPHOMA. - SEE ONCOLOGY TABLE. 2. Soft tissue mass, simple excision, thoracic dorsal epidural mass - DIFFUSE LARGE B-CELL LYMPHOMA. Microscopic Comment 1. LYMPHOMA Histologic type: Non-Hodgkin B-cell lymphoma: Diffuse large B-cell lymphoma. Grade (if applicable): High grade. Flow cytometry: N/A. Immunohistochemical stains: CD20. CD3, CD5, CD10, CD23, CD30, CD15, PAX-5, Ki-67, bcl-2, bcl-6, CD34. Touch preps/imprints: N/A. Comments: There is a diffuse infiltrate of atypical lymphocytes. The lymphocytes are medium to large in size with irregular nuclear contours. There are background smaller lymphocytes. While there are admixed smaller cells, there are diffuse areas of large cells. The large lymphocytes are positive for CD20, CD10, bcl-6, and bcl-2. CD3 and CD5 highlight admixed T-cells. CD34, CD30, CD23, and CD15 are negative. PAX-5 is positive. Ki-67 is variable with areas up to 70%. 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       12/18/2016 - 01/17/2017 Hospital Admission    The patient was readmitted to the hospital due to complete weakness secondary to spinal cord compression.  She underwent surgery and pathology report confirmed diagnosis of diffuse large B-cell lymphoma. She received IT chemo on 01/16/17      01/16/2017 Pathology Results    CEREBROSPINAL FLUID (SPECIMEN 1 OF 1 COLLECTED 01/16/17): NO MALIGNANT CELLS IDENTIFIED.      02/03/2017 - 02/07/2017 Hospital Admission    She is  admitted for cycle 2 of chemo      02/24/2017 -  02/28/2017 Hospital Admission    She is admitted for cycle 3 of chemotherapy      03/13/2017 PET scan    1. Low level FDG uptake associated with previous areas of soft tissue tumor within the chest and abdomen. Deauville criteria 2 and 3 compatible with treated tumor. No sites of residual metabolically active tumor (Deauville criteria 4 or 5) identified. 2. Mild diffuse uptake throughout the bone marrow is noted which is nonspecific in may reflect treatment related changes. 3. Stable size of previously noted complex lesion arising from the posterior cortex of the right kidney which now measures fluid attenuation. This is incompletely characterized on today's given lack of IV contrast material. 4. Aortic Atherosclerosis (ICD10-I70.0). Multi vessel coronary artery calcifications noted.      03/13/2017 Imaging    Study Conclusions  - Procedure narrative: Transthoracic echocardiography. Image quality was suboptimal. The study was technically difficult, as a result of poor acoustic windows, poor sound wave transmission, chest wall deformity, and body habitus. - Left ventricle: GLLS is normal at -19% The cavity size was normal. There was mild focal basal hypertrophy of the septum. Systolic function was vigorous. The estimated ejection fraction was in the range of 65% to 70%. Wall motion was normal; there were no regional wall motion abnormalities. There was an increased relative contribution of atrial contraction to ventricular filling. Doppler parameters are consistent with abnormal left ventricular relaxation (grade 1 diastolic dysfunction). - Pulmonary arteries: Systolic pressure could not be accurately estimated.      03/24/2017 - 03/28/2017 Hospital Admission    The patient was admitted to the hospital for cycle 4 of chemotherapy      Since the last time I saw her, she is doing well.  She had a bowel movement on Saturday.  She denies abdominal pain or nausea. Her weakness is stable.  No recent  falls.  No recent infection.  MEDICAL HISTORY:  Past Medical History:  Diagnosis Date  . Anemia   . Arthritis   . 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 Hypertension   . Lymphadenopathy 11/2016   "extensive"  . Paresthesia of lower extremity 11/2016   bilateral  . Pulmonary nodules 11/2016  . Right renal mass 11/2016    SURGICAL HISTORY: Past Surgical History:  Procedure Laterality Date  . ABDOMINAL HYSTERECTOMY    . BREAST LUMPECTOMY     left  . COLONOSCOPY N/A 11/02/2013   Procedure: COLONOSCOPY;  Surgeon: Danie Binder, MD;  [facility];  Service: Endoscopy;  Laterality: N/A;  1:15 - moved to [place] notified pt  . IR FLUORO GUIDE PORT INSERTION RIGHT  12/30/2016  . IR US GUIDE VASC ACCESS RIGHT  12/30/2016  . LAMINECTOMY N/A 12/18/2016   Procedure: DECOMPRESSION T5-8/THORACIC;  Surgeon: Kary Kos, MD;  Location: [facility];  Service: Neurosurgery;  Laterality: N/A;  Decompressive Thoracic Laminectomy, Thoracic five - six, Thoraic six - seven, Thoracic seven - eight  . SHOULDER SURGERY     right    SOCIAL HISTORY: Social History   Socioeconomic History  . Marital status: Widowed    Spouse name: Not on file  . Number of children: Not on file  . Years of education: Not on file  . Highest education level: Not on file  Social Needs  . Financial resource strain: Not on file  . Food insecurity - worry: Not on file  . Food insecurity -  inability: Not on file  . Transportation needs - medical: Not on file  . Transportation needs - non-medical: Not on file  Occupational History  . Not on file  Tobacco Use  . Smoking status: Never Smoker  . Smokeless tobacco: Never Used  Substance and Sexual Activity  . Alcohol use: No  . Drug use: No  . Sexual activity: No  Other Topics Concern  . Not on file  Social History Narrative  . Not on file    FAMILY HISTORY: Family History  Problem Relation Age of Onset  . Colon cancer Sister        at age 86  . Colon cancer  Brother        at age 66  . COPD Mother   . Diabetes Mellitus II Mother   . CAD Mother        Angina  . Colon cancer Father   . Thyroid disease Sister   . Diabetes Mellitus II Sister     ALLERGIES:  is allergic to contrast media [iodinated diagnostic agents].  MEDICATIONS:  Current Facility-Administered Medications  Medication Dose Route Frequency Provider Last Rate Last Dose  . 0.9 %  sodium chloride infusion   Intravenous Continuous Alvy Bimler, Pranit Owensby, MD      . acetaminophen (TYLENOL) tablet 650 mg  650 mg Oral Q4H PRN Alvy Bimler, Ebert Forrester, MD      . acyclovir (ZOVIRAX) tablet 400 mg  400 mg Oral BID Alvy Bimler, Reshma Hoey, MD      . cyclobenzaprine (FLEXERIL) tablet 10 mg  10 mg Oral BID Alvy Bimler, Aleksandr Pellow, MD      . docusate sodium (COLACE) capsule 100 mg  100 mg Oral BID Alvy Bimler, Krystan Northrop, MD      . lidocaine-prilocaine (EMLA) cream 1 application  1 application Topical PRN Alvy Bimler, Tashana Haberl, MD      . ondansetron (ZOFRAN) tablet 4-8 mg  4-8 mg Oral Q8H PRN Alvy Bimler, Emali Heyward, MD       Or  . ondansetron (ZOFRAN-ODT) disintegrating tablet 4-8 mg  4-8 mg Oral Q8H PRN Alvy Bimler, Analiz Tvedt, MD       Or  . ondansetron (ZOFRAN) injection 4 mg  4 mg Intravenous Q8H PRN Camiah Humm, MD       Or  . ondansetron (ZOFRAN) 8 mg in sodium chloride 0.9 % 50 mL IVPB  8 mg Intravenous Q8H PRN Hendrick Pavich, MD      . polyethylene glycol (MIRALAX / GLYCOLAX) packet 17 g  17 g Oral Daily Anevay Campanella, MD      . senna (SENOKOT) tablet 8.6 mg  1 tablet Oral BID Alvy Bimler, Daziah Hesler, MD      . senna-docusate (Senokot-S) tablet 1 tablet  1 tablet Oral QHS PRN Alvy Bimler, Terriann Difonzo, MD      . traMADol (ULTRAM) tablet 50 mg  50 mg Oral Q4H PRN Alvy Bimler, Davonne Baby, MD        REVIEW OF SYSTEMS:   Constitutional: Denies fevers, chills or abnormal night sweats Eyes: Denies blurriness of vision, double vision or watery eyes Ears, nose, mouth, throat, and face: Denies mucositis or sore throat Respiratory: Denies cough, dyspnea or wheezes Cardiovascular: Denies palpitation, chest discomfort or  lower extremity swelling Gastrointestinal:  Denies nausea, heartburn or change in bowel habits Skin: Denies abnormal skin rashes Lymphatics: Denies new lymphadenopathy or easy bruising Neurological:Denies numbness, tingling or new weaknesses Behavioral/Psych: Mood is stable, no new changes  All other systems were reviewed with the patient and are negative.  PHYSICAL EXAMINATION: ECOG PERFORMANCE STATUS: 1 -  Symptomatic but completely ambulatory GENERAL:alert, no distress and comfortable SKIN: skin color, texture, turgor are normal, no rashes or significant lesions EYES: normal, conjunctiva are pink and non-injected, sclera clear OROPHARYNX:no exudate, no erythema and lips, buccal mucosa, and tongue normal  NECK: supple, thyroid normal size, non-tender, without nodularity LYMPH:  no palpable lymphadenopathy in the cervical, axillary or inguinal LUNGS: clear to auscultation and percussion with normal breathing effort HEART: regular rate & rhythm and no murmurs and no lower extremity edema ABDOMEN:abdomen soft, non-tender and normal bowel sounds Musculoskeletal:no cyanosis of digits and no clubbing  PSYCH: alert & oriented x 3 with fluent speech NEURO: no focal motor/sensory deficits  LABORATORY DATA:  I have reviewed the data as listed Lab Results  Component Value Date   WBC 5.6 04/18/2017   HGB 10.4 (L) 04/18/2017   HCT 32.6 (L) 04/18/2017   MCV 91.3 04/18/2017   PLT 371 04/18/2017   Recent Labs    03/26/17 0635 03/28/17 0450 04/18/17 0927  NA 137 139 137  K 3.1* 2.8* 4.2  CL 102 106 97*  CO2 27 25 28   GLUCOSE 99 87 115  BUN 15 9 13   CREATININE 0.76 0.54 1.33*  CALCIUM 8.8* 8.3* 10.1  GFRNONAA >60 >60 41*  GFRAA >60 >60 47*  PROT 5.6* 5.0* 6.7  ALBUMIN 3.2* 3.1* 3.9  AST 23 65* 28  ALT 13* 33 13  ALKPHOS 45 36* 63  BILITOT 0.4 0.4 0.4    RADIOGRAPHIC STUDIES: I have personally reviewed the radiological images as listed and agreed with the findings in the  report. Dg Fluoro Guided Needle Plc Aspiration/injection Loc  Result Date: 03/27/2017 CLINICAL DATA:  Diffuse large B-cell lymphoma. Intrathecal chemotherapy. Fourth treatment EXAM: FLUOROSCOPICALLY GUIDED LUMBAR PUNCTURE FOR INTRATHECAL CHEMOTHERAPY FLUOROSCOPY TIME:  1 minute 5 seconds.  43.6 mGy PROCEDURE: Informed consent was obtained from the patient prior to the procedure, including potential complications of headache, allergy, and pain. With the patient prone, the lower back was prepped with Betadine. 1% Lidocaine was used for local anesthesia. Lumbar puncture was performed at the L4-L5 level using a 22 gauge needle with return of clearCSF. 12 mg of methotrexate and 5 mL saline was injected into the subarachnoid space. The patient tolerated the procedure well without apparent complication. IMPRESSION: Subtle intrathecal chemotherapy administration. Electronically Signed   By: Suzy Bouchard M.D.   On: 03/27/2017 12:55    ASSESSMENT & PLAN:   Diffuse large B cell lymphoma ([place]) She has side effects from treatment including constipation and then recently diarrhea, fatigue and severe anemia Overall, her symptoms aretolerable She is admitted todaycycle5ofR-EPOCHchemotherapy with dose adjustment due to her side effects. She will receive rituximab on day 3. She will return on 3/11/19for G-CSF support Recent PET/CT scan showed positive response to treatment The risks, benefits, side effects of chemotherapy are discussed with the patient and she agreed to proceed  Anemia due to antineoplastic chemotherapy She does not need blood transfusion Monitor closely  History of chronic constipation I would continue aggressive laxative therapy  CODE STATUS Full code  DVT prophylaxis Lovenox  Physical weakness secondary to thoracic surgery I will consult physical therapy.  Discharge planning At the end of the week  All questions were answered. The patient knows to call the  clinic with any problems, questions or concerns.     Heath Lark, MD 04/21/2017 8:47 AM

## 2017-04-21 NOTE — Progress Notes (Signed)
Pt is active with [facility] HHPT. Will need MD order for resumption at discharge. Marney Doctor RN,BSN,NCM [phone]

## 2017-04-21 NOTE — Evaluation (Signed)
Physical Therapy Evaluation Patient Details Name: Katrina [name] MRN: [id number] DOB: 12-Jan-1951 Today's Date: 04/21/2017   History of Present Illness  Patient is a 67 y/o female with hx of Diffuse large B-cell lymphoma and admission for cycle 5 of high-dose chemotherapy  Clinical Impression  Pt admitted with above diagnosis. Pt currently with functional limitations due to the deficits listed below (see PT Problem List).  Pt will benefit from skilled PT to increase their independence and safety with mobility to allow discharge to the venue listed below.  Pt assisted to bathroom and then ambulated just into hallway prior to returning to bed.  Pt reports working with HHPT prior to admission (was ambulating with w/c following for safety, performing LE exercises).  Pt very motivated to improve mobility and plans to d/c home.     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 and weakness (L LE > R LE) Other Brace/Splint: has L knee brace due to hyperextension however reports not using at this time      Mobility  Bed Mobility Overal bed mobility: Modified Independent             General bed mobility comments: increased time and self assist  Transfers Overall transfer level: Needs assistance Equipment used: Rolling walker (2 wheeled) Transfers: Sit to/from Stand Sit to Stand: Min assist         General transfer comment: performs with safe technique, slight assist to rise from bed, improved from [facility] (over toilet)  Ambulation/Gait Ambulation/Gait assistance: Min guard Ambulation Distance (Feet): 20 Feet Assistive device: Rolling walker (2 wheeled) Gait Pattern/deviations: Step-through pattern;Decreased stride length;Wide base of support;Trunk flexed Gait velocity:  decreased   General Gait Details: ambulated into bathroom and then slightly into hallway, distance limited by fatigue, occasional trunk flexion as pt uses vision to assist with feet placement due to decreased proprioception  Stairs            Wheelchair Mobility    Modified Rankin (Stroke Patients Only)       Balance           Standing balance support: Bilateral upper extremity supported;During functional activity Standing balance-Leahy Scale: Poor Standing balance comment: unable to wash hands at sink, requires BIL UE support/stability                             Pertinent Vitals/Pain Pain Assessment: No/denies pain Pain Intervention(s): Repositioned;Monitored during session    Home Living Family/patient expects to be discharged to:: Private residence Living Arrangements: Children(son, DIL) Available Help at Discharge: Family;Available 24 hours/day Type of Home: House Home Access: Ramped entrance Entrance Stairs-Rails: Right   Home Layout: One level Home Equipment: Wheelchair - manual;Grab bars - tub/shower;Hand held shower head;Tub bench;Walker - 2 wheels Additional Comments: staying with son and able to have 24/7 care    Prior Function Level of Independence: Needs assistance   Gait / Transfers Assistance Needed: walks with RW short distances with supervision and uses WC otherwise; has been working with [place] / Homemaking Assistance Needed: family to assist        Hand Dominance   Dominant Hand: Right    Extremity/Trunk Assessment        Lower Extremity Assessment Lower Extremity Assessment:  Generalized weakness RLE Deficits / Details: grossly at least 3/5 throughout RLE Sensation: decreased proprioception LLE Deficits / Details: slight hyperextension observed with ambulation of L knee, able to perform grossly at least 3/5 throughout however reports L LE weaker then R and has worse numbness and tingling LLE Sensation: decreased  proprioception(reports light touch sensation intact)    Cervical / Trunk Assessment Cervical / Trunk Exceptions: previous laminectomy for T6-8 tumor  Communication   Communication: No difficulties  Cognition Arousal/Alertness: Awake/alert Behavior During Therapy: WFL for tasks assessed/performed Overall Cognitive Status: Within Functional Limits for tasks assessed                                        General Comments      Exercises     Assessment/Plan    PT Assessment Patient needs continued PT services  PT Problem List Decreased strength;Decreased mobility;Decreased activity tolerance;Decreased balance;Decreased knowledge of use of DME;Impaired sensation;Decreased coordination       PT Treatment Interventions DME instruction;Balance training;Patient/family education;Gait training;Functional mobility training;Therapeutic exercise;Therapeutic activities    PT Goals (Current goals can be found in the Care Plan section)  Acute Rehab PT Goals PT Goal Formulation: With patient Time For Goal Achievement: 05/05/17 Potential to Achieve Goals: Good    Frequency Min 3X/week   Barriers to discharge        Co-evaluation               AM-PAC PT "6 Clicks" Daily Activity  Outcome Measure Difficulty turning over in bed (including adjusting bedclothes, sheets and blankets)?: A Little Difficulty moving from lying on back to sitting on the side of the bed? : A Little Difficulty sitting down on and standing up from a chair with arms (e.g., wheelchair, bedside commode, etc,.)?: A Little Help needed moving to and from a bed to chair (including a wheelchair)?: A Little Help needed walking in hospital room?: A Little Help needed climbing 3-5 steps with a railing? : A Lot 6 Click Score: 17    End of Session Equipment Utilized During Treatment: Gait belt Activity Tolerance: Patient limited by fatigue Patient left: with call bell/phone within reach;in bed Nurse  Communication: Mobility status PT Visit Diagnosis: Other abnormalities of gait and mobility (R26.89);Muscle weakness (generalized) (M62.81)    Time: 9983-3825 PT Time Calculation (min) (ACUTE ONLY): 14 min   Charges:   PT Evaluation $PT Eval Low Complexity: 1 Low     PT G Codes:        Carmelia Bake, PT, DPT 04/21/2017 Pager: [phone]  York Ram E 04/21/2017, 4:30 PM

## 2017-04-21 NOTE — Progress Notes (Signed)
Initial Nutrition Assessment  INTERVENTION:   -Provide Magic cup TID with meals, each supplement provides 290 kcal and 9 grams of protein -Provide Boost Plus daily ,each provides 360 kcal and 14g protein.  NUTRITION DIAGNOSIS:   Increased nutrient needs related to cancer and cancer related treatments as evidenced by estimated needs.  GOAL:   Patient will meet greater than or equal to 90% of their needs  MONITOR:   PO intake, Supplement acceptance, Labs, Weight trends, I & O's  REASON FOR ASSESSMENT:   Malnutrition Screening Tool    ASSESSMENT:   67 y.o. female with large B cell lymphoma. Admitted for chemotherapy.  Pt reports eating with no issues at this time. States she has had no issues at home recently with eating. States she doesn't like Ensure supplements but has not tried Boost supplements. Is willing to try a Boost Plus and wants Magic Cups as well.   Per chart review, pt has lost 42 lb since 12/3 (18% wt loss x 3 months, significant for time frame). Pt may have lost more weight, states she weighed 188 lb at [place] office on 3/1.   Medications: Colace capsule BID, Miralax packet daily, Senokot tablet BID Labs reviewed: GFR: 41   NUTRITION - FOCUSED PHYSICAL EXAM:  Nutrition focused physical exam shows no sign of depletion of muscle mass or body fat.  Diet Order:  Diet regular Room service appropriate? Yes; Fluid consistency: Thin  EDUCATION NEEDS:   Education needs have been addressed  Skin:  Skin Assessment: Reviewed RN Assessment  Last BM:  3/3  Height:   Ht Readings from Last 1 Encounters:  04/21/17 5\' 8"  (1.727 m)    Weight:   Wt Readings from Last 1 Encounters:  04/21/17 192 lb 9.6 oz (87.4 kg)    Ideal Body Weight:  63.6 kg  BMI:  Body mass index is 29.28 kg/m.  Estimated Nutritional Needs:   Kcal:  1900-2100  Protein:  100-110g  Fluid:  2L/day  Clayton Bibles, MS, RD, LDN [place] Dietitian Pager:  [phone] After Hours Pager: [phone]

## 2017-04-22 LAB — BASIC METABOLIC PANEL
Anion gap: 9 (ref 5–15)
BUN: 15 mg/dL (ref 6–20)
CHLORIDE: 99 mmol/L — AB (ref 101–111)
CO2: 27 mmol/L (ref 22–32)
Calcium: 8.7 mg/dL — ABNORMAL LOW (ref 8.9–10.3)
Creatinine, Ser: 1.02 mg/dL — ABNORMAL HIGH (ref 0.44–1.00)
GFR calc Af Amer: >60 mL/min (ref >60)
GFR calc non Af Amer: 56 mL/min — ABNORMAL LOW (ref >60)
GLUCOSE: 129 mg/dL — AB (ref 65–99)
Potassium: 3.5 mmol/L (ref 3.5–5.1)
Sodium: 135 mmol/L (ref 135–145)

## 2017-04-22 MED ORDER — SODIUM CHLORIDE 0.9% FLUSH
10.0000 mL | Freq: Two times a day (BID) | INTRAVENOUS | Status: DC
  Administered 2017-04-23 – 2017-04-25 (×4): 10 mL

## 2017-04-22 MED ORDER — SODIUM CHLORIDE 0.9 % IV SOLN
Freq: Once | INTRAVENOUS | Status: AC
  Administered 2017-04-22: 8 mg via INTRAVENOUS
  Filled 2017-04-22: qty 4

## 2017-04-22 MED ORDER — SODIUM CHLORIDE 0.9% FLUSH: 10.0000 mL | INTRAVENOUS | Status: DC | PRN

## 2017-04-22 MED ORDER — VINCRISTINE SULFATE CHEMO INJECTION 1 MG/ML
Freq: Once | INTRAVENOUS | Status: AC
  Administered 2017-04-22: 14:00:00 via INTRAVENOUS
  Filled 2017-04-22: qty 8

## 2017-04-22 NOTE — Progress Notes (Cosign Needed)
Chemo dosages and calculation checked with vicki Herbst RN

## 2017-04-22 NOTE — Progress Notes (Signed)
Katrina [name]   DOB:[birth year]   YT#:[id number]    Assessment & Plan:  Diffuse large B cell lymphoma ([place]) She has side effects from treatment including constipation and then recently diarrhea, fatigue and severe anemia Overall, her symptoms aretolerable She is admitted forcycle5ofR-EPOCHchemotherapy with dose adjustment due to her side effects. She will receive rituximab on day 3. She will return on3/11/19for G-CSF support Recent PET/CT scan showed positive response to treatment The risks, benefits, side effects of chemotherapy are discussed with the patient and she agreed to proceed  Anemia due to antineoplastic chemotherapy She does not need blood transfusion Monitor closely  History of chronicconstipation, resolved I would continue aggressive laxative therapy  CODE STATUS Full code  DVT prophylaxis Lovenox  Physical weakness secondary to thoracic surgery She will continue physical therapy.  Discharge planning At the end of the week  Heath Lark, MD  Subjective:  She tolerated treatment well.  She had normal bowel movement today.  No recent nausea or vomiting.  She is mobilizing with physical therapy.  She denies back pain.  Objective:  Vitals:   04/21/17 2229 04/22/17 0551  BP: 118/67 119/69  Pulse: 98 83  Resp: 17 16  Temp: 98.4 F (36.9 C) 98.2 F (36.8 C)  SpO2: 94% 95%     Intake/Output Summary (Last 24 hours) at 04/22/2017 1337 Last data filed at 04/22/2017 0900 Gross per 24 hour  Intake 697.77 ml  Output 200 ml  Net 497.77 ml    GENERAL:alert, no distress and comfortable SKIN: skin color, texture, turgor are normal, no rashes or significant lesions EYES: normal, Conjunctiva are pink and non-injected, sclera clear OROPHARYNX:no exudate, no erythema and lips, buccal mucosa, and tongue normal  NECK: supple, thyroid normal size, non-tender, without nodularity LYMPH:  no palpable lymphadenopathy in the cervical, axillary or  inguinal LUNGS: clear to auscultation and percussion with normal breathing effort HEART: regular rate & rhythm and no murmurs and no lower extremity edema ABDOMEN:abdomen soft, non-tender and normal bowel sounds Musculoskeletal:no cyanosis of digits and no clubbing  NEURO: alert & oriented x 3 with fluent speech, no focal motor/sensory deficits   Labs:  Lab Results  Component Value Date   WBC 5.6 04/18/2017   HGB 10.4 (L) 04/18/2017   HCT 32.6 (L) 04/18/2017   MCV 91.3 04/18/2017   PLT 371 04/18/2017   NEUTROABS 3.9 04/18/2017    Lab Results  Component Value Date   NA 135 04/22/2017   K 3.5 04/22/2017   CL 99 (L) 04/22/2017   CO2 27 04/22/2017

## 2017-04-22 NOTE — Progress Notes (Signed)
Physical Therapy Treatment Patient Details Name: Katrina [name] MRN: [id number] DOB: November 23, 1950 Today's Date: 04/22/2017    History of Present Illness Patient is a 67 y/o female with hx of Diffuse large B-cell lymphoma and admission for cycle 5 of high-dose chemotherapy    PT Comments    Patient's performance improved today. She was able to ambulate with a rolling walker 110 feet with min guard. Further ambulation was limited by fatigue. She also required less assistance for bed mobility and transfers. Patient continues to exhibit decreased endurance, strength, balance, coordination, sensation in bilateral lower extremities, and diminished transfer and ambulation skills.  Patient would continue to benefit from skilled physical therapy in current environment and next venue to continue return to prior function and increase strength, endurance, balance, coordination, and transfers, functional mobility and gait skills.     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 and weakness (L LE > R LE) Required Braces or Orthoses: Other Brace/Splint Other Brace/Splint: has L knee brace due to hyperextension however reports not using at this time Restrictions Weight Bearing Restrictions: No    Mobility  Bed Mobility Overal bed mobility: Modified Independent Bed Mobility: Rolling;Sidelying to Sit Rolling: Modified independent (Device/Increase time) Sidelying to sit: Modified independent (Device/Increase time)          Transfers Overall transfer level: Needs assistance Equipment used: Rolling walker (2 wheeled) Transfers: Sit to/from Stand Sit to Stand: Min guard;Min assist         General transfer comment: performs with safe technique, slight assist  to rise from bed, improved from raised BSC (over toilet)  Ambulation/Gait Ambulation/Gait assistance: Min guard Ambulation Distance (Feet): 110 Feet Assistive device: Rolling walker (2 wheeled) Gait Pattern/deviations: Step-through pattern;Decreased stride length;Wide base of support;Trunk flexed;Decreased step length - right;Decreased step length - left Gait velocity: decreased   General Gait Details: ambulated into bathroom and then into and down hallway, distance limited by fatigue, occasional trunk flexion as pt uses vision to assist with feet placement due to decreased proprioception   Stairs            Wheelchair Mobility    Modified Rankin (Stroke Patients Only)       Balance Overall balance assessment: Needs assistance Sitting-balance support: Bilateral upper extremity supported;Feet supported Sitting balance-Leahy Scale: Good     Standing balance support: Bilateral upper extremity supported;During functional activity Standing balance-Leahy Scale: Fair                              Cognition Arousal/Alertness: Awake/alert Behavior During Therapy: WFL for tasks assessed/performed Overall Cognitive Status: Within Functional Limits for tasks assessed                                        Exercises      General Comments        Pertinent Vitals/Pain Pain Assessment: No/denies pain    Home Living                      Prior Function            PT Goals (current goals  can now be found in the care plan section) Acute Rehab PT Goals Patient Stated Goal: keep getting better and able to walk further PT Goal Formulation: With patient Time For Goal Achievement: 05/05/17 Potential to Achieve Goals: Good Progress towards PT goals: Progressing toward goals    Frequency    Min 3X/week      PT Plan Current plan remains appropriate    Co-evaluation              AM-PAC PT "6 Clicks" Daily Activity  Outcome  Measure  Difficulty turning over in bed (including adjusting bedclothes, sheets and blankets)?: A Little Difficulty moving from lying on back to sitting on the side of the bed? : A Little Difficulty sitting down on and standing up from a chair with arms (e.g., wheelchair, bedside commode, etc,.)?: A Little Help needed moving to and from a bed to chair (including a wheelchair)?: A Little Help needed walking in hospital room?: A Little Help needed climbing 3-5 steps with a railing? : A Lot 6 Click Score: 17    End of Session Equipment Utilized During Treatment: Gait belt Activity Tolerance: Patient limited by fatigue Patient left: with call bell/phone within reach;in chair Nurse Communication: Mobility status PT Visit Diagnosis: Other abnormalities of gait and mobility (R26.89);Muscle weakness (generalized) (M62.81)     Time: 3202-3343 PT Time Calculation (min) (ACUTE ONLY): 28 min  Charges:  $Gait Training: 8-22 mins $Therapeutic Activity: 8-22 mins                    G Codes:       Liel Rudden D. Hartnett-Rands, MS, PT Per [place] #[id number] 04/22/2017, 12:20 PM

## 2017-04-23 DIAGNOSIS — G992 Myelopathy in diseases classified elsewhere: Secondary | ICD-10-CM

## 2017-04-23 MED ORDER — SODIUM CHLORIDE 0.9 % IV SOLN
375.0000 mg/m2 | Freq: Once | INTRAVENOUS | Status: AC
  Administered 2017-04-23: 800 mg via INTRAVENOUS
  Filled 2017-04-23: qty 30

## 2017-04-23 MED ORDER — EPINEPHRINE PF 1 MG/ML IJ SOLN: 0.5000 mg | Freq: Once | INTRAMUSCULAR | Status: DC | PRN

## 2017-04-23 MED ORDER — SODIUM CHLORIDE 0.9 % IV SOLN: Freq: Once | INTRAVENOUS | Status: DC | PRN

## 2017-04-23 MED ORDER — EPINEPHRINE PF 1 MG/10ML IJ SOSY: 0.2500 mg | PREFILLED_SYRINGE | Freq: Once | INTRAMUSCULAR | Status: DC | PRN

## 2017-04-23 MED ORDER — DIPHENHYDRAMINE HCL 50 MG/ML IJ SOLN: 25.0000 mg | Freq: Once | INTRAMUSCULAR | Status: DC | PRN

## 2017-04-23 MED ORDER — HEPARIN SOD (PORK) LOCK FLUSH 100 UNIT/ML IV SOLN
500.0000 [IU] | Freq: Once | INTRAVENOUS | Status: AC | PRN
  Administered 2017-04-25: 500 [IU]
  Filled 2017-04-23: qty 5

## 2017-04-23 MED ORDER — SODIUM CHLORIDE 0.9 % IV SOLN: Freq: Once | INTRAVENOUS | Status: DC

## 2017-04-23 MED ORDER — ALBUTEROL SULFATE (2.5 MG/3ML) 0.083% IN NEBU: 2.5000 mg | INHALATION_SOLUTION | Freq: Once | RESPIRATORY_TRACT | Status: DC | PRN

## 2017-04-23 MED ORDER — VINCRISTINE SULFATE CHEMO INJECTION 1 MG/ML
Freq: Once | INTRAVENOUS | Status: AC
  Administered 2017-04-23: 20:00:00 via INTRAVENOUS
  Filled 2017-04-23: qty 8

## 2017-04-23 MED ORDER — ALTEPLASE 2 MG IJ SOLR: 2.0000 mg | Freq: Once | INTRAMUSCULAR | Status: DC | PRN

## 2017-04-23 MED ORDER — DIPHENHYDRAMINE HCL 50 MG PO CAPS
50.0000 mg | ORAL_CAPSULE | Freq: Once | ORAL | Status: AC
  Administered 2017-04-23: 50 mg via ORAL
  Filled 2017-04-23: qty 1

## 2017-04-23 MED ORDER — SODIUM CHLORIDE 0.9 % IV SOLN
Freq: Once | INTRAVENOUS | Status: AC
  Administered 2017-04-23: 8 mg via INTRAVENOUS
  Filled 2017-04-23: qty 4

## 2017-04-23 MED ORDER — FAMOTIDINE IN NACL 20-0.9 MG/50ML-% IV SOLN: 20.0000 mg | Freq: Once | INTRAVENOUS | Status: DC | PRN

## 2017-04-23 MED ORDER — ACETAMINOPHEN 325 MG PO TABS
650.0000 mg | ORAL_TABLET | Freq: Once | ORAL | Status: AC
  Administered 2017-04-23: 650 mg via ORAL
  Filled 2017-04-23: qty 2

## 2017-04-23 MED ORDER — DIPHENHYDRAMINE HCL 50 MG/ML IJ SOLN: 50.0000 mg | Freq: Once | INTRAMUSCULAR | Status: DC | PRN

## 2017-04-23 MED ORDER — HEPARIN SOD (PORK) LOCK FLUSH 100 UNIT/ML IV SOLN: 250.0000 [IU] | Freq: Once | INTRAVENOUS | Status: DC | PRN

## 2017-04-23 MED ORDER — SODIUM CHLORIDE 0.9% FLUSH: 3.0000 mL | INTRAVENOUS | Status: DC | PRN

## 2017-04-23 MED ORDER — SODIUM CHLORIDE 0.9% FLUSH: 10.0000 mL | INTRAVENOUS | Status: DC | PRN

## 2017-04-23 MED ORDER — METHYLPREDNISOLONE SODIUM SUCC 125 MG IJ SOLR: 125.0000 mg | Freq: Once | INTRAMUSCULAR | Status: DC | PRN

## 2017-04-23 NOTE — Progress Notes (Signed)
Katrina [name]   DOB:09-26-1950   HK#:[id number]    Assessment & Plan:   Diffuse large B cell lymphoma ([place]) She has side effects from treatment including constipation and then recently diarrhea, fatigue and severe anemia Overall, her symptoms aretolerable She is admitted forcycle5ofR-EPOCHchemotherapy with dose adjustment due to her side effects. She will receive rituximab on day 3. She will return on3/11/19for G-CSF support Recent PET/CT scan showed positive response to treatment The risks, benefits, side effects of chemotherapy are discussed with the patient and she agreed to proceed  Anemia due to antineoplastic chemotherapy She does not need blood transfusion Monitor closely  History of chronicconstipation, resolved I would continue aggressive laxative therapy  CODE STATUS Full code  DVT prophylaxis Lovenox  Physical weakness secondary to thoracic surgery She will continue physical therapy.  Discharge planning At the end of the week   Heath Lark, MD 04/23/2017  8:02 AM   Subjective:  She tolerated treatment well.  Denies any nausea.  No infusion reaction.  She continues to have regular bowel movement. She is ambulating well.  Objective:  Vitals:   04/22/17 2207 04/23/17 0543  BP: 134/73 114/70  Pulse: 87 83  Resp: 16 16  Temp: 97.6 F (36.4 C) 97.7 F (36.5 C)  SpO2: 97% 100%     Intake/Output Summary (Last 24 hours) at 04/23/2017 0802 Last data filed at 04/23/2017 7564 Gross per 24 hour  Intake 2491.87 ml  Output -  Net 2491.87 ml    GENERAL:alert, no distress and comfortable SKIN: skin color, texture, turgor are normal, no rashes or significant lesions EYES: normal, Conjunctiva are pink and non-injected, sclera clear OROPHARYNX:no exudate, no erythema and lips, buccal mucosa, and tongue normal  NECK: supple, thyroid normal size, non-tender, without nodularity LYMPH:  no palpable lymphadenopathy in the cervical, axillary or  inguinal LUNGS: clear to auscultation and percussion with normal breathing effort HEART: regular rate & rhythm and no murmurs and no lower extremity edema ABDOMEN:abdomen soft, non-tender and normal bowel sounds Musculoskeletal:no cyanosis of digits and no clubbing  NEURO: alert & oriented x 3 with fluent speech, no focal motor/sensory deficits   Labs:  Lab Results  Component Value Date   WBC 5.6 04/18/2017   HGB 10.4 (L) 04/18/2017   HCT 32.6 (L) 04/18/2017   MCV 91.3 04/18/2017   PLT 371 04/18/2017   NEUTROABS 3.9 04/18/2017    Lab Results  Component Value Date   NA 135 04/22/2017   K 3.5 04/22/2017   CL 99 (L) 04/22/2017   CO2 27 04/22/2017

## 2017-04-24 MED ORDER — ONDANSETRON HCL 40 MG/20ML IJ SOLN
Freq: Once | INTRAMUSCULAR | Status: AC
  Administered 2017-04-24: 8 mg via INTRAVENOUS
  Filled 2017-04-24: qty 4

## 2017-04-24 MED ORDER — VINCRISTINE SULFATE CHEMO INJECTION 1 MG/ML
Freq: Once | INTRAVENOUS | Status: AC
  Administered 2017-04-24: 18:00:00 via INTRAVENOUS
  Filled 2017-04-24: qty 8

## 2017-04-24 NOTE — Care Management Important Message (Signed)
Important Message  Patient Details  Name: NIAOMI CARTAYA MRN: [id number] Date of Birth: 09/10/1950   Medicare Important Message Given:  Yes    Kerin Salen 04/24/2017, 11:20 AMImportant Message  Patient Details  Name: MAYSON STERBENZ MRN: [id number] Date of Birth: Jul 12, 1950   Medicare Important Message Given:  Yes    Kerin Salen 04/24/2017, 11:20 AM

## 2017-04-24 NOTE — Evaluation (Signed)
Physical Therapy Treatment Patient Details Name: Katrina [name] MRN: [id number] DOB: [birth year] Today's Date: 04/24/2017    History of Present Illness Patient is a 67 y/o female with hx of Diffuse large B-cell lymphoma and admission for cycle 5 of high-dose chemotherapy    PT Comments    Assisted OOB tom amb to bathroom.  Pt very unsteady with limited amb distance.  Pt very dissapointed as she wanted to walk in the hallway "like last time".   Pt c/o enlarged ABD and :heaxy legs".  Will attempt another day as schedule permits.   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 and weakness (L LE > R LE) Required Braces or Orthoses: Other Brace/Splint Other Brace/Splint: has L knee brace due to hyperextension however reports not using at this time    Mobility  Bed Mobility Overal bed mobility: Needs Assistance Bed Mobility: Rolling;Sidelying to Sit Rolling: Modified independent (Device/Increase time)   Supine to sit: Supervision Sit to supine: Min assist   General bed mobility comments: assisted OOB and back into bed per pt request  Transfers Overall transfer level: Needs assistance Equipment used: Rolling walker (2 wheeled) Transfers: Sit to/from Stand Sit to Stand: Min guard;Min assist         General transfer comment: good use of hands to  steady self but very unsteady   Ambulation/Gait Ambulation/Gait assistance: Min assist Ambulation Distance (Feet): 8 Feet Assistive device: Rolling walker (2 wheeled) Gait Pattern/deviations: Step-through pattern;Decreased stride length;Wide base of support;Trunk flexed;Decreased step length - right;Decreased step length - left Gait velocity: decreased   General Gait Details: amb to bathroom then around room.  Very unsteady gait with uncontrolled desend to  bed,.    Stairs            Wheelchair Mobility    Modified Rankin (Stroke Patients Only)       Balance                                            Cognition   Behavior During Therapy: WFL for tasks assessed/performed Overall Cognitive Status: Within Functional Limits for tasks assessed                                 General Comments: following conversation well and appropriate      Exercises      General Comments        Pertinent Vitals/Pain Pain Location: legs with sensory changes Pain Descriptors / Indicators: Tender Pain Intervention(s): Monitored during session;Repositioned    Home Living                      Prior Function            PT Goals (current goals can now be found in the care plan section)      Frequency           PT Plan      Co-evaluation              AM-PAC PT "6 Clicks" Daily Activity  Outcome Measure  Difficulty turning over in bed (including adjusting  bedclothes, sheets and blankets)?: A Little Difficulty moving from lying on back to sitting on the side of the bed? : A Little Difficulty sitting down on and standing up from a chair with arms (e.g., wheelchair, bedside commode, etc,.)?: A Little Help needed moving to and from a bed to chair (including a wheelchair)?: A Little Help needed walking in hospital room?: A Little Help needed climbing 3-5 steps with a railing? : A Lot 6 Click Score: 17    End of Session Equipment Utilized During Treatment: Gait belt Activity Tolerance: Patient limited by fatigue Patient left: with call bell/phone within reach;in chair Nurse Communication: Mobility status PT Visit Diagnosis: Other abnormalities of gait and mobility (R26.89);Muscle weakness (generalized) (M62.81)     Time: 1561-5379 PT Time Calculation (min) (ACUTE ONLY): 17 min  Charges:  $Gait Training: 8-22 mins                    G Codes:       Rica Koyanagi  PTA [facility]   Acute  Rehab Pager      [phone]

## 2017-04-24 NOTE — Progress Notes (Signed)
Katrina [name]   DOB:[birth year]   BO#:[id number]    Assessment & Plan:   Diffuse large B cell lymphoma ([place]) She has side effects from treatment including constipation and then recently diarrhea, fatigue and severe anemia Overall, her symptoms aretolerable She is admittedforcycle5ofR-EPOCHchemotherapy with dose adjustment due to her side effects. She has received rituximab on day 3. She will return on3/11/19for G-CSF support Recent PET/CT scan showed positive response to treatment The risks, benefits, side effects of chemotherapy are discussed with the patient and she agreed to proceed  Anemia due to antineoplastic chemotherapy She does not need blood transfusion Monitor closely  History of chronicconstipation, resolved I would continue aggressive laxative therapy  CODE STATUS Full code  DVT prophylaxis Lovenox  Physical weakness secondary to thoracic surgery She has chronic bilateral lower extremity weakness, left greater than right, not new She will continuephysical therapy.  Discharge planning At the end of the week    Heath Lark, MD 04/24/2017  8:04 AM   Subjective:  She denies nausea.  Has some mild left lower extremity weakness but denies fall.  She denies constipation.  Overall, she tolerated treatment well  Objective:  Vitals:   04/23/17 2123 04/24/17 0525  BP: 134/72 134/80  Pulse: 82 81  Resp: 16 20  Temp: 98.4 F (36.9 C) 98 F (36.7 C)  SpO2: 97% 99%     Intake/Output Summary (Last 24 hours) at 04/24/2017 0804 Last data filed at 04/24/2017 0500 Gross per 24 hour  Intake 1971.45 ml  Output 2 ml  Net 1969.45 ml    GENERAL:alert, no distress and comfortable SKIN: skin color, texture, turgor are normal, no rashes or significant lesions EYES: normal, Conjunctiva are pink and non-injected, sclera clear OROPHARYNX:no exudate, no erythema and lips, buccal mucosa, and tongue normal  NECK: supple, thyroid normal size, non-tender, without  nodularity LYMPH:  no palpable lymphadenopathy in the cervical, axillary or inguinal LUNGS: clear to auscultation and percussion with normal breathing effort HEART: regular rate & rhythm and no murmurs and no lower extremity edema ABDOMEN:abdomen soft, non-tender and normal bowel sounds Musculoskeletal:no cyanosis of digits and no clubbing  NEURO: alert & oriented x 3 with fluent speech, no focal motor/sensory deficits   Labs:  Lab Results  Component Value Date   WBC 5.6 04/18/2017   HGB 10.4 (L) 04/18/2017   HCT 32.6 (L) 04/18/2017   MCV 91.3 04/18/2017   PLT 371 04/18/2017   NEUTROABS 3.9 04/18/2017    Lab Results  Component Value Date   NA 135 04/22/2017   K 3.5 04/22/2017   CL 99 (L) 04/22/2017   CO2 27 04/22/2017    Studies:  No results found.

## 2017-04-25 MED ORDER — ONDANSETRON HCL 40 MG/20ML IJ SOLN
Freq: Once | INTRAMUSCULAR | Status: AC
  Administered 2017-04-25: 16 mg via INTRAVENOUS
  Filled 2017-04-25: qty 8

## 2017-04-25 MED ORDER — SODIUM CHLORIDE 0.9 % IV SOLN
600.0000 mg/m2 | Freq: Once | INTRAVENOUS | Status: AC
  Administered 2017-04-25: 1240 mg via INTRAVENOUS
  Filled 2017-04-25: qty 62

## 2017-04-25 NOTE — Discharge Summary (Signed)
Physician Discharge Summary  Patient ID: Katrina [name] MRN: [id number] DOB/AGE: Jan 08, 1951 67 y.o.  Admit date: 04/21/2017 Discharge date: 04/25/2017  Primary Care Physician:  Patient, No Pcp Per   Discharge Diagnoses:    Present on Admission: . Diffuse large B cell lymphoma ([place]) . Anemia due to antineoplastic chemotherapy . Thoracic myelopathy   Discharge Medications:  Allergies as of 04/25/2017      Reactions   Contrast Media [iodinated Diagnostic Agents] Hives, Itching      Medication List    STOP taking these medications   potassium chloride SA 20 MEQ tablet Commonly known as:  K-DUR,KLOR-CON     TAKE these medications   acyclovir 400 MG tablet Commonly known as:  ZOVIRAX Take 1 tablet (400 mg total) by mouth 2 (two) times daily.   bisacodyl 5 MG EC tablet Commonly known as:  DULCOLAX Take 5 mg by mouth daily as needed for moderate constipation.   cyclobenzaprine 10 MG tablet Commonly known as:  FLEXERIL Take 1 tablet (10 mg total) by mouth 3 (three) times daily as needed for muscle spasms. What changed:  when to take this   Lactulose 20 GM/30ML Soln Take 30 mLs (20 g total) by mouth 3 (three) times daily.   lidocaine-prilocaine cream Commonly known as:  EMLA Apply 1 application topically as needed. What changed:  reasons to take this   morphine 15 MG 12 hr tablet Commonly known as:  MS CONTIN Take 1 tablet (15 mg total) by mouth every 12 (twelve) hours. What changed:    when to take this  reasons to take this   ondansetron 8 MG tablet Commonly known as:  ZOFRAN Take 1 tablet (8 mg total) by mouth every 8 (eight) hours as needed for nausea (not responsive to prochlorperazine (COMPAZINE)).   polyethylene glycol packet Commonly known as:  MIRALAX / GLYCOLAX Take 17 g by mouth daily.   prochlorperazine 10 MG tablet Commonly known as:  COMPAZINE Take 1 tablet (10 mg total) by mouth every 6 (six) hours as needed for nausea or vomiting.    tetrahydrozoline 0.05 % ophthalmic solution Place 1-2 drops into both eyes 4 (four) times daily as needed (For eye irritation.).   traMADol 50 MG tablet Commonly known as:  ULTRAM TAKE 2 TABLETS BY MOUTH EVERY 6 HOURS AS NEEDED FOR SEVERE PAIN   triamterene-hydrochlorothiazide 37.5-25 MG capsule Commonly known as:  DYAZIDE Take 1 each (1 capsule total) by mouth daily.       Disposition and Follow-up:   Significant Diagnostic Studies:  Dg Fluoro Guided Needle Plc Aspiration/injection Loc  Result Date: 03/27/2017 CLINICAL DATA:  Diffuse large B-cell lymphoma. Intrathecal chemotherapy. Fourth treatment EXAM: FLUOROSCOPICALLY GUIDED LUMBAR PUNCTURE FOR INTRATHECAL CHEMOTHERAPY FLUOROSCOPY TIME:  1 minute 5 seconds.  43.6 mGy PROCEDURE: Informed consent was obtained from the patient prior to the procedure, including potential complications of headache, allergy, and pain. With the patient prone, the lower back was prepped with Betadine. 1% Lidocaine was used for local anesthesia. Lumbar puncture was performed at the L4-L5 level using a 22 gauge needle with return of clearCSF. 12 mg of methotrexate and 5 mL saline was injected into the subarachnoid space. The patient tolerated the procedure well without apparent complication. IMPRESSION: Subtle intrathecal chemotherapy administration. Electronically Signed   By: Suzy Bouchard M.D.   On: 03/27/2017 12:55    Discharge Laboratory Values: Lab Results  Component Value Date   WBC 5.6 04/18/2017   HGB 10.4 (L) 04/18/2017  HCT 32.6 (L) 04/18/2017   MCV 91.3 04/18/2017   PLT 371 04/18/2017   Lab Results  Component Value Date   NA 135 04/22/2017   K 3.5 04/22/2017   CL 99 (L) 04/22/2017   CO2 27 04/22/2017    Brief H and P: For complete details please refer to admission H and P, but in brief, she was admitted to the hospital on 04/21/2017 for cycle 5 of chemotherapy.  Physical Exam at Discharge: BP (!) 149/81 (BP Location: Left Arm)    Pulse 75   Temp 97.8 F (36.6 C) (Oral)   Resp 20   Ht 5\' 8"  (1.727 m)   Wt 192 lb 9.6 oz (87.4 kg)   SpO2 98%   BMI 29.28 kg/m  GENERAL:alert, no distress and comfortable SKIN: skin color, texture, turgor are normal, no rashes or significant lesions EYES: normal, Conjunctiva are pink and non-injected, sclera clear OROPHARYNX:no exudate, no erythema and lips, buccal mucosa, and tongue normal  NECK: supple, thyroid normal size, non-tender, without nodularity LYMPH:  no palpable lymphadenopathy in the cervical, axillary or inguinal LUNGS: clear to auscultation and percussion with normal breathing effort HEART: regular rate & rhythm and no murmurs and no lower extremity edema ABDOMEN:abdomen soft, non-tender and normal bowel sounds Musculoskeletal:no cyanosis of digits and no clubbing  NEURO: alert & oriented x 3 with fluent speech, no focal motor/sensory deficits  Hospital Course:  Active Problems:   Diffuse large B cell lymphoma ([facility])   Anemia due to antineoplastic chemotherapy   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 She will return on3/11/19for G-CSF support She will complete final cycle of treatment next month and will be admitted on May 19, 2017 again  Anemia due to antineoplastic chemotherapy She does not need blood transfusion Monitor closely  History of chronicconstipation, resolved She will continue aggressive laxative therapy upon discharge  CODE STATUS Full code  DVT prophylaxis Lovenox  Physical weakness secondary to thoracic surgery She has chronic bilateral lower extremity weakness, left greater than right, not  new She will continuephysical therapy at home upon discharge   Diet:  Regular  Activity:  As tolerated  Condition at Discharge:   stable  Signed: Dr. Heath Lark [phone]  04/25/2017, 8:07 AM

## 2017-04-25 NOTE — Progress Notes (Signed)
Cytoxan dosage and calculation checked with Lottie Dawson RN.

## 2017-04-25 NOTE — Progress Notes (Signed)
Physical Therapy Treatment Patient Details Name: Katrina [name] MRN: [id number] DOB: Jul 23, 1950 Today's Date: 04/25/2017    History of Present Illness Patient is a 67 y/o female with hx of Diffuse large B-cell lymphoma and admission for cycle 5 of high-dose chemotherapy    PT Comments    Pt feeling better and eager to go home later today.  Assisted OOB to amb a greater distance in hallway using her rw but needed several sitting rest breaks due to general weakness and B LE weakness.  Pt highly motivated.     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 and weakness (L LE > R LE) Required Braces or Orthoses: Other Brace/Splint Other Brace/Splint: has L knee brace due to hyperextension however reports not using at this time Restrictions Weight Bearing Restrictions: No    Mobility  Bed Mobility Overal bed mobility: Modified Independent             General bed mobility comments: self able with increased time  Transfers Overall transfer level: Needs assistance Equipment used: Rolling walker (2 wheeled) Transfers: Sit to/from [facility] Sit to Stand: Supervision Stand pivot transfers: Supervision       General transfer comment: good use of hands to  steady self but very unsteady   Ambulation/Gait Ambulation/Gait assistance: Supervision;Min guard Ambulation Distance (Feet): 50 Feet(10 feet x 5 sitting rest breaks) Assistive device: Rolling walker (2 wheeled) Gait Pattern/deviations: Step-through pattern;Decreased stride length;Wide base of support;Trunk flexed;Decreased step length - right;Decreased step length - left Gait velocity: decreased   General Gait Details: tolerated an increased distance but needed multiple  sitting mini rest breaks to complete total distance   Stairs            Wheelchair Mobility    Modified Rankin (Stroke Patients Only)       Balance                                            Cognition Arousal/Alertness: Awake/alert Behavior During Therapy: WFL for tasks assessed/performed Overall Cognitive Status: Within Functional Limits for tasks assessed                                 General Comments: highly motivated      Exercises      General Comments        Pertinent Vitals/Pain Pain Assessment: No/denies pain    Home Living                      Prior Function            PT Goals (current goals can now be found in the care plan section) Progress towards PT goals: Progressing toward goals    Frequency    Min 3X/week      PT Plan Current plan remains appropriate    Co-evaluation              AM-PAC PT "6 Clicks" Daily Activity  Outcome Measure  Difficulty turning over in bed (including adjusting bedclothes, sheets and blankets)?: A Little Difficulty moving from  lying on back to sitting on the side of the bed? : A Little Difficulty sitting down on and standing up from a chair with arms (e.g., wheelchair, bedside commode, etc,.)?: A Little Help needed moving to and from a bed to chair (including a wheelchair)?: A Little Help needed walking in hospital room?: A Little Help needed climbing 3-5 steps with a railing? : A Lot 6 Click Score: 17    End of Session Equipment Utilized During Treatment: Gait belt Activity Tolerance: Patient limited by fatigue Patient left: with call bell/phone within reach;in chair Nurse Communication: Mobility status PT Visit Diagnosis: Other abnormalities of gait and mobility (R26.89);Muscle weakness (generalized) (M62.81)     Time: 0347-4259 PT Time Calculation (min) (ACUTE ONLY): 25 min  Charges:  $Gait Training: 8-22 mins $Therapeutic Activity: 8-22  mins                    G Codes:       {Da Authement  PTA [facility]  Acute  Rehab Pager      [phone]

## 2017-04-28 ENCOUNTER — Inpatient Hospital Stay: Payer: Medicare Other

## 2017-04-28 VITALS — BP 133/80 | HR 96 | Temp 98.0°F | Resp 18

## 2017-04-28 DIAGNOSIS — C8330 Diffuse large B-cell lymphoma, unspecified site: Secondary | ICD-10-CM

## 2017-04-28 DIAGNOSIS — R200 Anesthesia of skin: Secondary | ICD-10-CM | POA: Diagnosis not present

## 2017-04-28 DIAGNOSIS — Z91041 Radiographic dye allergy status: Secondary | ICD-10-CM | POA: Diagnosis not present

## 2017-04-28 DIAGNOSIS — N289 Disorder of kidney and ureter, unspecified: Secondary | ICD-10-CM | POA: Diagnosis not present

## 2017-04-28 DIAGNOSIS — R110 Nausea: Secondary | ICD-10-CM | POA: Diagnosis not present

## 2017-04-28 DIAGNOSIS — R202 Paresthesia of skin: Secondary | ICD-10-CM | POA: Diagnosis not present

## 2017-04-28 DIAGNOSIS — G8220 Paraplegia, unspecified: Secondary | ICD-10-CM | POA: Diagnosis not present

## 2017-04-28 DIAGNOSIS — T451X5A Adverse effect of antineoplastic and immunosuppressive drugs, initial encounter: Secondary | ICD-10-CM | POA: Diagnosis not present

## 2017-04-28 DIAGNOSIS — D6481 Anemia due to antineoplastic chemotherapy: Secondary | ICD-10-CM | POA: Diagnosis not present

## 2017-04-28 DIAGNOSIS — K5900 Constipation, unspecified: Secondary | ICD-10-CM | POA: Diagnosis not present

## 2017-04-28 DIAGNOSIS — Z5189 Encounter for other specified aftercare: Secondary | ICD-10-CM | POA: Diagnosis not present

## 2017-04-28 MED ORDER — PEGFILGRASTIM INJECTION 6 MG/0.6ML ~~LOC~~
6.0000 mg | PREFILLED_SYRINGE | Freq: Once | SUBCUTANEOUS | Status: AC
  Administered 2017-04-28: 6 mg via SUBCUTANEOUS

## 2017-05-03 ENCOUNTER — Other Ambulatory Visit: Payer: Self-pay | Admitting: Hematology and Oncology

## 2017-05-05 ENCOUNTER — Other Ambulatory Visit: Payer: Self-pay

## 2017-05-05 ENCOUNTER — Telehealth: Payer: Self-pay

## 2017-05-05 MED ORDER — TRAMADOL HCL 50 MG PO TABS: ORAL_TABLET | ORAL | 0 refills | Status: DC

## 2017-05-05 NOTE — Telephone Encounter (Signed)
Called and ask if she needed Tramadol Rx refill. She states that she does. Rx called into Pharmacy.

## 2017-05-05 NOTE — Telephone Encounter (Signed)
Can you call and ask if she really needs these medications or just electronic generated orders? Also, can she wait until I see her next?

## 2017-05-06 ENCOUNTER — Telehealth: Payer: Self-pay

## 2017-05-06 NOTE — Telephone Encounter (Signed)
Called and see if she needed a refill on lactulose and states that she does not.

## 2017-05-16 ENCOUNTER — Inpatient Hospital Stay: Payer: Medicare Other

## 2017-05-16 ENCOUNTER — Telehealth: Payer: Self-pay | Admitting: Hematology and Oncology

## 2017-05-16 ENCOUNTER — Inpatient Hospital Stay (HOSPITAL_BASED_OUTPATIENT_CLINIC_OR_DEPARTMENT_OTHER): Payer: Medicare Other | Admitting: Hematology and Oncology

## 2017-05-16 ENCOUNTER — Encounter: Payer: Self-pay | Admitting: Hematology and Oncology

## 2017-05-16 VITALS — BP 157/97 | HR 96 | Temp 97.9°F | Resp 18 | Ht 68.0 in | Wt 188.9 lb

## 2017-05-16 DIAGNOSIS — N289 Disorder of kidney and ureter, unspecified: Secondary | ICD-10-CM | POA: Diagnosis not present

## 2017-05-16 DIAGNOSIS — G8220 Paraplegia, unspecified: Secondary | ICD-10-CM

## 2017-05-16 DIAGNOSIS — C8330 Diffuse large B-cell lymphoma, unspecified site: Secondary | ICD-10-CM

## 2017-05-16 DIAGNOSIS — D6481 Anemia due to antineoplastic chemotherapy: Secondary | ICD-10-CM

## 2017-05-16 DIAGNOSIS — T451X5A Adverse effect of antineoplastic and immunosuppressive drugs, initial encounter: Secondary | ICD-10-CM | POA: Diagnosis not present

## 2017-05-16 DIAGNOSIS — Z91041 Radiographic dye allergy status: Secondary | ICD-10-CM | POA: Diagnosis not present

## 2017-05-16 LAB — CBC WITH DIFFERENTIAL/PLATELET
BASOS ABS: 0 10*3/uL (ref 0.0–0.1)
BASOS PCT: 0 %
Eosinophils Absolute: 0 10*3/uL (ref 0.0–0.5)
Eosinophils Relative: 0 %
HEMATOCRIT: 29.3 % — AB (ref 34.8–46.6)
HEMOGLOBIN: 9.6 g/dL — AB (ref 11.6–15.9)
Lymphocytes Relative: 8 %
Lymphs Abs: 0.7 10*3/uL — ABNORMAL LOW (ref 0.9–3.3)
MCH: 29.3 pg (ref 25.1–34.0)
MCHC: 32.8 g/dL (ref 31.5–36.0)
MCV: 89.3 fL (ref 79.5–101.0)
MONO ABS: 1 10*3/uL — AB (ref 0.1–0.9)
Monocytes Relative: 12 %
NEUTROS ABS: 6.5 10*3/uL (ref 1.5–6.5)
NEUTROS PCT: 80 %
Platelets: 382 10*3/uL (ref 145–400)
RBC: 3.28 MIL/uL — AB (ref 3.70–5.45)
RDW: 16.7 % — AB (ref 11.2–14.5)
WBC: 8.2 10*3/uL (ref 3.9–10.3)

## 2017-05-16 LAB — COMPREHENSIVE METABOLIC PANEL
ALBUMIN: 3.6 g/dL (ref 3.5–5.0)
ALT: 12 U/L (ref 0–55)
AST: 26 U/L (ref 5–34)
Alkaline Phosphatase: 63 U/L (ref 40–150)
Anion gap: 8 (ref 3–11)
BILIRUBIN TOTAL: 0.3 mg/dL (ref 0.2–1.2)
BUN: 11 mg/dL (ref 7–26)
CO2: 30 mmol/L — ABNORMAL HIGH (ref 22–29)
CREATININE: 1.02 mg/dL (ref 0.60–1.10)
Calcium: 10 mg/dL (ref 8.4–10.4)
Chloride: 99 mmol/L (ref 98–109)
GFR calc Af Amer: >60 mL/min (ref >60)
GFR, EST NON AFRICAN AMERICAN: 56 mL/min — AB (ref >60)
GLUCOSE: 110 mg/dL (ref 70–140)
POTASSIUM: 3.3 mmol/L — AB (ref 3.5–5.1)
Sodium: 137 mmol/L (ref 136–145)
TOTAL PROTEIN: 6.2 g/dL — AB (ref 6.4–8.3)

## 2017-05-16 LAB — SAMPLE TO BLOOD BANK

## 2017-05-16 MED ORDER — PREDNISONE 50 MG PO TABS: ORAL_TABLET | ORAL | 0 refills | Status: DC

## 2017-05-16 MED ORDER — CYCLOBENZAPRINE HCL 10 MG PO TABS: 10.0000 mg | ORAL_TABLET | Freq: Three times a day (TID) | ORAL | 1 refills | Status: DC | PRN

## 2017-05-16 NOTE — Assessment & Plan Note (Signed)
She continues to have mild weakness from prior surgery She has completed home physical therapy program We will continue physical therapy while hospitalized and in the future, I plan to refer her for outpatient PT

## 2017-05-16 NOTE — Progress Notes (Signed)
[facility] OFFICE PROGRESS NOTE  Patient Care Team: Patient, No Pcp Per as PCP - General (General Practice)  ASSESSMENT & PLAN:  Diffuse large B cell lymphoma ([place]) She is doing very well since the last time I saw her She would be admitted for cycle 6 of chemotherapy on May 19, 2017 I will continue with reduced dose chemotherapy for her We would administer rituximab on day 3 She will return on day 8 on May 26, 2017 for G-CSF support I plan to repeat imaging study on Jun 20, 2017 with CT scan of the chest, abdomen and pelvis for final assessment for response to treatment   Anemia due to antineoplastic chemotherapy Her energy level is fair. We will continue reduced dose chemotherapy as before We will monitor closely  Kidney lesion, native, right Her kidney function is good I will order CT scan with contrast to evaluate kidney lesion further next month  Contrast media allergy Her reaction to contrast allergy is minor We discussed premedication of prednisone to be taken before CT scan at 13 hours, 7 hours and 1 hour before plan CT scan along with Benadryl an hour before CT  Paraparesis [facility]) She continues to have mild weakness from prior surgery She has completed home physical therapy program We will continue physical therapy while hospitalized and in the future, I plan to refer her for outpatient PT   Orders Placed This Encounter  Procedures  . CT ABDOMEN PELVIS W CONTRAST    Standing Status:   Future    Standing Expiration Date:   05/17/2018    Scheduling Instructions:     Patient prefers in the morning    Order Specific Question:   If indicated for the ordered procedure, I authorize the administration of contrast media per Radiology protocol    Answer:   Yes    Order Specific Question:   Preferred imaging location?    Answer:   [facility]    Order Specific Question:   Radiology Contrast Protocol - do NOT remove file path    Answer:    \\[email or web address]  . CT CHEST W CONTRAST    Standing Status:   Future    Standing Expiration Date:   05/17/2018    Scheduling Instructions:     Patient prefers morning    Order Specific Question:   If indicated for the ordered procedure, I authorize the administration of contrast media per Radiology protocol    Answer:   Yes    Order Specific Question:   Preferred imaging location?    Answer:   [facility]    Order Specific Question:   Radiology Contrast Protocol - do NOT remove file path    Answer:   [email or web address]    INTERVAL HISTORY: Please see below for problem oriented charting. She returns with her son for further follow-up She is doing very well Denies recent infection No new lymphadenopathy She continues to have numbness and tingling sensation on the feet but not severe She denies nausea or constipation Her energy level is fair  SUMMARY OF ONCOLOGIC HISTORY:   Diffuse large B cell lymphoma ([place])   12/12/2016 Imaging    CT scan showed  1. Extensive mesenteric, retroperitoneal, left hilar, mediastinal, left supraclavicular and left axillary adenopathy. Differential considerations include metastatic adenopathy, lymphoma and leukemia. The size of the nodes in the mesentery and retroperitoneum are suggestive of non-Hodgkin's lymphoma. 2. Multiple left upper lobe nodules and left chest wall metastases, primarily arising from  the left fifth rib. Differential considerations include metastatic disease and lymphoma. A left upper lobe primary lung carcinoma is a possibility. 3. 3.2 cm solid right renal mass. Differential considerations include renal cell carcinoma and oncocytoma. 4. Small amount of free peritoneal fluid, most likely due to lymphatic obstruction by the mesenteric adenopathy. 5. Left fourth rib fracture. 6. Colonic diverticulosis. 7. Calcific coronary artery and aortic atherosclerosis. Aortic  Atherosclerosis (ICD10-I70.0).       12/12/2016 - 12/14/2016 Hospital Admission    He was briefly admitted and evaluated for weakness.  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 Tumor fills the right neural foramina at T6-7 and T7-8 and completely replaces the T7 vertebral body.      12/18/2016 Surgery    She underwent procedure: Decompressive thoracic laminectomy from T5-T8 for resection of epidural tumor        12/18/2016 Pathology Results    1. Soft tissue mass, simple excision, thoracic dorsal epidural mass - DIFFUSE LARGE B-CELL LYMPHOMA. - SEE ONCOLOGY TABLE. 2. Soft tissue mass, simple excision, thoracic dorsal epidural mass - DIFFUSE LARGE B-CELL LYMPHOMA. Microscopic Comment 1. LYMPHOMA Histologic type: Non-Hodgkin B-cell lymphoma: Diffuse large B-cell lymphoma. Grade (if applicable): High grade. Flow cytometry: N/A. Immunohistochemical stains: CD20. CD3, CD5, CD10, CD23, CD30, CD15, PAX-5, Ki-67, bcl-2, bcl-6, CD34. Touch preps/imprints: N/A. Comments: There is a diffuse infiltrate of atypical lymphocytes. The lymphocytes are medium to large in size with irregular nuclear contours. There are background smaller lymphocytes. While there are admixed smaller cells, there are diffuse areas of large cells. The large lymphocytes are positive for CD20, CD10, bcl-6, and bcl-2. CD3 and CD5 highlight admixed T-cells. CD34, CD30, CD23, and CD15 are negative. PAX-5 is positive. Ki-67 is variable with areas up to 70%. 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       12/18/2016 - 01/17/2017 Hospital Admission    The patient was readmitted to the hospital due to complete weakness secondary to spinal cord compression.  She underwent surgery and pathology report confirmed diagnosis of diffuse large B-cell lymphoma. She received IT chemo on 01/16/17      01/16/2017 Pathology Results    CEREBROSPINAL FLUID (SPECIMEN 1 OF 1 COLLECTED 01/16/17): NO MALIGNANT CELLS IDENTIFIED.      02/03/2017 - 02/07/2017 Hospital Admission    She is admitted for cycle 2 of chemo      02/24/2017 - 02/28/2017 Hospital Admission    She is admitted for cycle 3 of chemotherapy      03/13/2017 PET scan    1. Low level FDG uptake associated with previous areas of soft tissue tumor within the chest and abdomen. Deauville criteria 2 and 3 compatible with treated tumor. No sites of residual metabolically active tumor (Deauville criteria 4 or 5) identified. 2. Mild diffuse uptake throughout the bone marrow is noted which is nonspecific in may reflect treatment related changes. 3. Stable size of previously noted complex lesion arising from the posterior cortex of the right kidney which now measures fluid attenuation. This is incompletely characterized on today's given lack of IV  contrast material. 4. Aortic Atherosclerosis (ICD10-I70.0). Multi vessel coronary artery calcifications noted.      03/13/2017 Imaging    Study Conclusions  - Procedure narrative: Transthoracic echocardiography. Image quality was suboptimal. The study was technically difficult, as a result of poor acoustic windows, poor sound wave transmission, chest wall deformity, and body habitus. - Left ventricle: GLLS is normal at -19% The cavity size was normal. There was mild focal basal hypertrophy of the septum. Systolic function was vigorous. The estimated ejection fraction was in the range of 65% to 70%. Wall motion was normal; there were no regional wall motion  abnormalities. There was an increased relative contribution of atrial contraction to ventricular filling. Doppler parameters are consistent with abnormal left ventricular relaxation (grade 1 diastolic dysfunction). - Pulmonary arteries: Systolic pressure could not be accurately estimated.      03/24/2017 - 03/28/2017 Hospital Admission    The patient was admitted to the hospital for cycle 4 of chemotherapy      04/21/2017 - 04/25/2017 Hospital Admission    She is admitted for cycle 5 of R-EPOCH       REVIEW OF SYSTEMS:   Constitutional: Denies fevers, chills or abnormal weight loss Eyes: Denies blurriness of vision Ears, nose, mouth, throat, and face: Denies mucositis or sore throat Respiratory: Denies cough, dyspnea or wheezes Cardiovascular: Denies palpitation, chest discomfort or lower extremity swelling Gastrointestinal:  Denies nausea, heartburn or change in bowel habits Skin: Denies abnormal skin rashes Lymphatics: Denies new lymphadenopathy or easy bruising Neurological:Denies numbness, tingling or new weaknesses Behavioral/Psych: Mood is stable, no new changes  All other systems were reviewed with the patient and are negative.  I have reviewed the past medical history, past surgical history, social history and family history with the patient and they are unchanged from previous note.  ALLERGIES:  is allergic to contrast media [iodinated diagnostic agents].  MEDICATIONS:  Current Outpatient Medications  Medication Sig Dispense Refill  . acyclovir (ZOVIRAX) 400 MG tablet Take 1 tablet (400 mg total) by mouth 2 (two) times daily. 60 tablet 9  . bisacodyl (DULCOLAX) 5 MG EC tablet Take 5 mg by mouth daily as needed for moderate constipation.    . cyclobenzaprine (FLEXERIL) 10 MG tablet Take 1 tablet (10 mg total) by mouth 3 (three) times daily as needed for muscle spasms. 90 tablet 1  . Lactulose 20 GM/30ML SOLN Take 30 mLs (20 g total) by mouth 3 (three) times daily. 473 mL 1  .  lidocaine-prilocaine (EMLA) cream Apply 1 application topically as needed. (Patient taking differently: Apply 1 application topically as needed (For port-a-cath.). ) 30 g 6  . morphine (MS CONTIN) 15 MG 12 hr tablet Take 1 tablet (15 mg total) by mouth every 12 (twelve) hours. (Patient taking differently: Take 15 mg by mouth every 12 (twelve) hours as needed for pain. ) 60 tablet 0  . ondansetron (ZOFRAN) 8 MG tablet Take 1 tablet (8 mg total) by mouth every 8 (eight) hours as needed for nausea (not responsive to prochlorperazine (COMPAZINE)). 60 tablet 1  . polyethylene glycol (MIRALAX / GLYCOLAX) packet Take 17 g by mouth daily.     . predniSONE (DELTASONE) 50 MG tablet Take 1 pill at 13 hours, 7 hours and 1 hour before CT scan 3 tablet 0  . prochlorperazine (COMPAZINE) 10 MG tablet Take 1 tablet (10 mg total) by mouth every 6 (six) hours as needed for nausea or vomiting. 60 tablet 1  . tetrahydrozoline 0.05 % ophthalmic solution Place  1-2 drops into both eyes 4 (four) times daily as needed (For eye irritation.).    Marland Kitchen traMADol (ULTRAM) 50 MG tablet TAKE 2 TABLETS BY MOUTH EVERY 6 HOURS AS NEEDED FOR SEVERE PAIN 90 tablet 0  . triamterene-hydrochlorothiazide (DYAZIDE) 37.5-25 MG capsule Take 1 each (1 capsule total) by mouth daily. 30 capsule 1   No current facility-administered medications for this visit.     PHYSICAL EXAMINATION: ECOG PERFORMANCE STATUS: 1 - Symptomatic but completely ambulatory  Vitals:   05/16/17 1504  BP: (!) 157/97  Pulse: 96  Resp: 18  Temp: 97.9 F (36.6 C)  SpO2: 100%   Filed Weights   05/16/17 1504  Weight: 188 lb 14.4 oz (85.7 kg)    GENERAL:alert, no distress and comfortable SKIN: skin color, texture, turgor are normal, no rashes or significant lesions EYES: normal, Conjunctiva are pink and non-injected, sclera clear OROPHARYNX:no exudate, no erythema and lips, buccal mucosa, and tongue normal  NECK: supple, thyroid normal size, non-tender, without  nodularity LYMPH:  no palpable lymphadenopathy in the cervical, axillary or inguinal LUNGS: clear to auscultation and percussion with normal breathing effort HEART: regular rate & rhythm and no murmurs and no lower extremity edema ABDOMEN:abdomen soft, non-tender and normal bowel sounds Musculoskeletal:no cyanosis of digits and no clubbing  NEURO: alert & oriented x 3 with fluent speech, no focal motor/sensory deficits  LABORATORY DATA:  I have reviewed the data as listed    Component Value Date/Time   NA 137 05/16/2017 1436   NA 134 (L) 02/19/2017 0925   K 3.3 (L) 05/16/2017 1436   K 3.0 (LL) 02/19/2017 0925   CL 99 05/16/2017 1436   CO2 30 (H) 05/16/2017 1436   CO2 29 02/19/2017 0925   GLUCOSE 110 05/16/2017 1436   GLUCOSE 135 02/19/2017 0925   BUN 11 05/16/2017 1436   BUN 7.4 02/19/2017 0925   CREATININE 1.02 05/16/2017 1436   CREATININE 0.9 02/19/2017 0925   CALCIUM 10.0 05/16/2017 1436   CALCIUM 8.9 02/19/2017 0925   PROT 6.2 (L) 05/16/2017 1436   PROT 6.4 02/19/2017 0925   ALBUMIN 3.6 05/16/2017 1436   ALBUMIN 3.4 (L) 02/19/2017 0925   AST 26 05/16/2017 1436   AST 16 02/19/2017 0925   ALT 12 05/16/2017 1436   ALT 13 02/19/2017 0925   ALKPHOS 63 05/16/2017 1436   ALKPHOS 86 02/19/2017 0925   BILITOT 0.3 05/16/2017 1436   BILITOT 0.65 02/19/2017 0925   GFRNONAA 56 (L) 05/16/2017 1436   GFRAA >60 05/16/2017 1436    No results found for: SPEP, UPEP  Lab Results  Component Value Date   WBC 8.2 05/16/2017   NEUTROABS 6.5 05/16/2017   HGB 9.6 (L) 05/16/2017   HCT 29.3 (L) 05/16/2017   MCV 89.3 05/16/2017   PLT 382 05/16/2017      Chemistry      Component Value Date/Time   NA 137 05/16/2017 1436   NA 134 (L) 02/19/2017 0925   K 3.3 (L) 05/16/2017 1436   K 3.0 (LL) 02/19/2017 0925   CL 99 05/16/2017 1436   CO2 30 (H) 05/16/2017 1436   CO2 29 02/19/2017 0925   BUN 11 05/16/2017 1436   BUN 7.4 02/19/2017 0925   CREATININE 1.02 05/16/2017 1436    CREATININE 0.9 02/19/2017 0925      Component Value Date/Time   CALCIUM 10.0 05/16/2017 1436   CALCIUM 8.9 02/19/2017 0925   ALKPHOS 63 05/16/2017 1436   ALKPHOS 86 02/19/2017 0925  AST 26 05/16/2017 1436   AST 16 02/19/2017 0925   ALT 12 05/16/2017 1436   ALT 13 02/19/2017 0925   BILITOT 0.3 05/16/2017 1436   BILITOT 0.65 02/19/2017 0925       All questions were answered. The patient knows to call the clinic with any problems, questions or concerns. No barriers to learning was detected.  I spent 25 minutes counseling the patient face to face. The total time spent in the appointment was 30 minutes and more than 50% was on counseling and review of test results  Heath Lark, MD 05/16/2017 3:35 PM

## 2017-05-16 NOTE — Assessment & Plan Note (Signed)
She is doing very well since the last time I saw her She would be admitted for cycle 6 of chemotherapy on May 19, 2017 I will continue with reduced dose chemotherapy for her We would administer rituximab on day 3 She will return on day 8 on May 26, 2017 for G-CSF support I plan to repeat imaging study on Jun 20, 2017 with CT scan of the chest, abdomen and pelvis for final assessment for response to treatment

## 2017-05-16 NOTE — Assessment & Plan Note (Signed)
Her kidney function is good I will order CT scan with contrast to evaluate kidney lesion further next month

## 2017-05-16 NOTE — Telephone Encounter (Signed)
Gave patient AVS and calendar of upcoming may appointments.  °

## 2017-05-16 NOTE — Assessment & Plan Note (Signed)
Her reaction to contrast allergy is minor We discussed premedication of prednisone to be taken before CT scan at 13 hours, 7 hours and 1 hour before plan CT scan along with Benadryl an hour before CT

## 2017-05-16 NOTE — Assessment & Plan Note (Signed)
Her energy level is fair. We will continue reduced dose chemotherapy as before We will monitor closely

## 2017-05-19 ENCOUNTER — Inpatient Hospital Stay (HOSPITAL_COMMUNITY)
Admission: AD | Admit: 2017-05-19 | Discharge: 2017-05-23 | DRG: 847 | Disposition: A | Discharge status: Home / Self Care | Payer: Medicare Other | Source: Ambulatory Visit | Attending: Hematology and Oncology | Admitting: Hematology and Oncology

## 2017-05-19 ENCOUNTER — Other Ambulatory Visit: Payer: Self-pay

## 2017-05-19 ENCOUNTER — Encounter (HOSPITAL_COMMUNITY): Payer: Self-pay

## 2017-05-19 DIAGNOSIS — I10 Essential (primary) hypertension: Secondary | ICD-10-CM | POA: Diagnosis present

## 2017-05-19 DIAGNOSIS — G8220 Paraplegia, unspecified: Secondary | ICD-10-CM | POA: Diagnosis present

## 2017-05-19 DIAGNOSIS — M1990 Unspecified osteoarthritis, unspecified site: Secondary | ICD-10-CM | POA: Diagnosis present

## 2017-05-19 DIAGNOSIS — R200 Anesthesia of skin: Secondary | ICD-10-CM | POA: Diagnosis present

## 2017-05-19 DIAGNOSIS — Z7901 Long term (current) use of anticoagulants: Secondary | ICD-10-CM | POA: Diagnosis not present

## 2017-05-19 DIAGNOSIS — Z79899 Other long term (current) drug therapy: Secondary | ICD-10-CM

## 2017-05-19 DIAGNOSIS — Z800 Family history of malignant neoplasm of digestive organs: Secondary | ICD-10-CM | POA: Diagnosis not present

## 2017-05-19 DIAGNOSIS — K5909 Other constipation: Secondary | ICD-10-CM | POA: Diagnosis not present

## 2017-05-19 DIAGNOSIS — D630 Anemia in neoplastic disease: Secondary | ICD-10-CM

## 2017-05-19 DIAGNOSIS — E876 Hypokalemia: Secondary | ICD-10-CM | POA: Diagnosis not present

## 2017-05-19 DIAGNOSIS — Z5111 Encounter for antineoplastic chemotherapy: Secondary | ICD-10-CM | POA: Diagnosis not present

## 2017-05-19 DIAGNOSIS — C8330 Diffuse large B-cell lymphoma, unspecified site: Secondary | ICD-10-CM | POA: Diagnosis not present

## 2017-05-19 DIAGNOSIS — R5383 Other fatigue: Secondary | ICD-10-CM | POA: Diagnosis present

## 2017-05-19 DIAGNOSIS — Z91041 Radiographic dye allergy status: Secondary | ICD-10-CM | POA: Diagnosis not present

## 2017-05-19 DIAGNOSIS — K5900 Constipation, unspecified: Secondary | ICD-10-CM | POA: Diagnosis not present

## 2017-05-19 DIAGNOSIS — R202 Paresthesia of skin: Secondary | ICD-10-CM

## 2017-05-19 DIAGNOSIS — R209 Unspecified disturbances of skin sensation: Secondary | ICD-10-CM | POA: Diagnosis not present

## 2017-05-19 DIAGNOSIS — R531 Weakness: Secondary | ICD-10-CM | POA: Diagnosis not present

## 2017-05-19 DIAGNOSIS — T451X5A Adverse effect of antineoplastic and immunosuppressive drugs, initial encounter: Secondary | ICD-10-CM | POA: Diagnosis present

## 2017-05-19 DIAGNOSIS — R600 Localized edema: Secondary | ICD-10-CM | POA: Diagnosis present

## 2017-05-19 DIAGNOSIS — D6481 Anemia due to antineoplastic chemotherapy: Secondary | ICD-10-CM | POA: Diagnosis present

## 2017-05-19 DIAGNOSIS — R197 Diarrhea, unspecified: Secondary | ICD-10-CM | POA: Diagnosis present

## 2017-05-19 MED ORDER — HEPARIN SOD (PORK) LOCK FLUSH 100 UNIT/ML IV SOLN: 250.0000 [IU] | Freq: Once | INTRAVENOUS | Status: DC | PRN

## 2017-05-19 MED ORDER — ONDANSETRON HCL 4 MG/2ML IJ SOLN: 4.0000 mg | Freq: Three times a day (TID) | INTRAMUSCULAR | Status: DC | PRN

## 2017-05-19 MED ORDER — COLD PACK MISC ONCOLOGY
1.0000 | Freq: Once | Status: DC | PRN
  Filled 2017-05-19: qty 1

## 2017-05-19 MED ORDER — SODIUM CHLORIDE 0.9% FLUSH: 10.0000 mL | INTRAVENOUS | Status: DC | PRN

## 2017-05-19 MED ORDER — HEPARIN SOD (PORK) LOCK FLUSH 100 UNIT/ML IV SOLN
500.0000 [IU] | Freq: Once | INTRAVENOUS | Status: DC | PRN
  Filled 2017-05-19: qty 5

## 2017-05-19 MED ORDER — ACYCLOVIR 400 MG PO TABS
400.0000 mg | ORAL_TABLET | Freq: Two times a day (BID) | ORAL | Status: DC
  Administered 2017-05-19 – 2017-05-23 (×9): 400 mg via ORAL
  Filled 2017-05-19 (×9): qty 1

## 2017-05-19 MED ORDER — SODIUM CHLORIDE 0.9% FLUSH: 3.0000 mL | INTRAVENOUS | Status: DC | PRN

## 2017-05-19 MED ORDER — LIDOCAINE-PRILOCAINE 2.5-2.5 % EX CREA: 1.0000 "application " | TOPICAL_CREAM | CUTANEOUS | Status: DC | PRN

## 2017-05-19 MED ORDER — SODIUM CHLORIDE 0.9 % IV SOLN
INTRAVENOUS | Status: DC
  Administered 2017-05-19 – 2017-05-21 (×3): via INTRAVENOUS

## 2017-05-19 MED ORDER — CYCLOBENZAPRINE HCL 10 MG PO TABS
10.0000 mg | ORAL_TABLET | Freq: Three times a day (TID) | ORAL | Status: DC | PRN
  Administered 2017-05-19 – 2017-05-22 (×3): 10 mg via ORAL
  Filled 2017-05-19 (×3): qty 1

## 2017-05-19 MED ORDER — ALTEPLASE 2 MG IJ SOLR
2.0000 mg | Freq: Once | INTRAMUSCULAR | Status: DC | PRN
  Filled 2017-05-19: qty 2

## 2017-05-19 MED ORDER — ACETAMINOPHEN 325 MG PO TABS: 650.0000 mg | ORAL_TABLET | ORAL | Status: DC | PRN

## 2017-05-19 MED ORDER — SODIUM CHLORIDE 0.9 % IV SOLN: INTRAVENOUS | Status: DC

## 2017-05-19 MED ORDER — POLYETHYLENE GLYCOL 3350 17 G PO PACK
17.0000 g | PACK | Freq: Every day | ORAL | Status: DC
  Administered 2017-05-19 – 2017-05-23 (×5): 17 g via ORAL
  Filled 2017-05-19 (×5): qty 1

## 2017-05-19 MED ORDER — SENNOSIDES-DOCUSATE SODIUM 8.6-50 MG PO TABS: 1.0000 | ORAL_TABLET | Freq: Every evening | ORAL | Status: DC | PRN

## 2017-05-19 MED ORDER — LIDOCAINE-PRILOCAINE 2.5-2.5 % EX CREA
TOPICAL_CREAM | Freq: Once | CUTANEOUS | Status: AC
  Administered 2017-05-19: 09:00:00 via TOPICAL
  Filled 2017-05-19: qty 5

## 2017-05-19 MED ORDER — HOT PACK MISC ONCOLOGY
1.0000 | Freq: Once | Status: DC | PRN
  Filled 2017-05-19: qty 1

## 2017-05-19 MED ORDER — VINCRISTINE SULFATE CHEMO INJECTION 1 MG/ML
Freq: Once | INTRAVENOUS | Status: AC
  Administered 2017-05-19: 12:00:00 via INTRAVENOUS
  Filled 2017-05-19: qty 8

## 2017-05-19 MED ORDER — TRAMADOL HCL 50 MG PO TABS
50.0000 mg | ORAL_TABLET | Freq: Four times a day (QID) | ORAL | Status: DC | PRN
  Administered 2017-05-19 – 2017-05-22 (×4): 50 mg via ORAL
  Filled 2017-05-19 (×4): qty 1

## 2017-05-19 MED ORDER — ONDANSETRON 4 MG PO TBDP: 4.0000 mg | ORAL_TABLET | Freq: Three times a day (TID) | ORAL | Status: DC | PRN

## 2017-05-19 MED ORDER — ENOXAPARIN SODIUM 40 MG/0.4ML ~~LOC~~ SOLN
40.0000 mg | SUBCUTANEOUS | Status: DC
  Administered 2017-05-19 – 2017-05-23 (×5): 40 mg via SUBCUTANEOUS
  Filled 2017-05-19 (×5): qty 0.4

## 2017-05-19 MED ORDER — ONDANSETRON HCL 4 MG PO TABS: 4.0000 mg | ORAL_TABLET | Freq: Three times a day (TID) | ORAL | Status: DC | PRN

## 2017-05-19 MED ORDER — SODIUM CHLORIDE 0.9 % IV SOLN
8.0000 mg | Freq: Three times a day (TID) | INTRAVENOUS | Status: DC | PRN
  Filled 2017-05-19: qty 4

## 2017-05-19 MED ORDER — SODIUM CHLORIDE 0.9 % IV SOLN
Freq: Once | INTRAVENOUS | Status: AC
  Administered 2017-05-19: 8 mg via INTRAVENOUS
  Filled 2017-05-19: qty 4

## 2017-05-19 NOTE — Evaluation (Signed)
Physical Therapy Evaluation Patient Details Name: Katrina [name] MRN: [id number] DOB: 1951-01-26 Today's Date: 05/19/2017   History of Present Illness  Patient is a 67 y/o female with hx of Diffuse large B-cell lymphoma and admission for cycle 6 of high-dose chemotherapy  Clinical Impression  Pt admitted with above diagnosis. Pt currently with functional limitations due to the deficits listed below (see PT Problem List). Pt will benefit from skilled PT to increase their independence and safety with mobility to allow discharge to the venue listed below.  Pt reports recently being discharged from [facility] as she has been educated and is able to now progress on her own.  Pt reports decreased mobility, poor coordination and balance, and decreased sensation during chemo treatments so will continue to assist pt with mobility while in acute care in preparation for return home with family assist.  Pt reports she was able to ambulate 200 feet using RW and w/c following her safety prior to admission.     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 and weakness (L LE > R LE) Other Brace/Splint: has L knee brace due to hyperextension however reports not using at this time      Mobility  Bed Mobility Overal bed mobility: Modified Independent Bed Mobility: Rolling;Sidelying to Sit;Sit to Sidelying Rolling: Supervision Sidelying to sit: Supervision     Sit to sidelying: Supervision General bed mobility comments: only requries increased time  Transfers Overall transfer level: Needs assistance Equipment used: Rolling walker (2 wheeled) Transfers: Sit to/from Stand Sit to Stand: Min assist         General transfer comment: assist for rise and  steady  Ambulation/Gait Ambulation/Gait assistance: Min assist Ambulation Distance (Feet): 50 Feet Assistive device: Rolling walker (2 wheeled) Gait Pattern/deviations: Step-through pattern;Decreased stride length;Wide base of support;Trunk flexed Gait velocity: decreased   General Gait Details: assist for occasional steadying, short steps, increased reliance on UE support on RW  Stairs            Wheelchair Mobility    Modified Rankin (Stroke Patients Only)       Balance Overall balance assessment: Needs assistance         Standing balance support: Bilateral upper extremity supported;During functional activity Standing balance-Leahy Scale: Poor Standing balance comment: unable to wash hands at sink without steady assist, requires BIL UE support/stability                             Pertinent Vitals/Pain Pain Assessment: No/denies pain Pain Location: reports legs withs sensory changes Pain Descriptors / Indicators: Tender Pain Intervention(s): (states sensory changes during active chemo however improves with time)    Home Living Family/patient expects to be discharged to:: Private residence Living Arrangements: Children(son, DIL) Available Help at Discharge: Family;Available 24 hours/day Type of Home: House Home Access: Ramped entrance Entrance Stairs-Rails: Right   Home Layout: One level Home Equipment: Wheelchair - manual;Grab bars - tub/shower;Hand held shower head;Tub bench;Walker - 2 wheels Additional Comments: staying with son and able to have 24/7 care    Prior Function Level of Independence: Needs assistance   Gait / Transfers Assistance Needed: walks with RW short distances with supervision and uses WC otherwise; pt was discharged from [facility]  ADL's / Homemaking Assistance Needed: family to assist        Hand Dominance   Dominant Hand: Right    Extremity/Trunk Assessment        Lower Extremity Assessment Lower Extremity  Assessment: Generalized weakness RLE Deficits / Details: grossly at least 3/5 throughout RLE Sensation: decreased proprioception LLE Deficits / Details: slight hyperextension observed with ambulation of L knee, able to perform grossly at least 3/5 throughout however reports L LE weaker then R and has worse numbness and tingling LLE Sensation: decreased proprioception(reports light touch sensation intact)    Cervical / Trunk Assessment Cervical / Trunk Exceptions: previous laminectomy for T6-8 tumor  Communication   Communication: No difficulties  Cognition Arousal/Alertness: Awake/alert Behavior During Therapy: WFL for tasks assessed/performed Overall Cognitive Status: Within Functional Limits for tasks assessed                                 General Comments: highly motivated      General Comments      Exercises     Assessment/Plan    PT Assessment Patient needs continued PT services  PT Problem List Decreased strength;Decreased mobility;Decreased activity tolerance;Decreased balance;Decreased knowledge of use of DME;Impaired sensation;Decreased coordination       PT Treatment Interventions DME instruction;Balance training;Patient/family education;Gait training;Functional mobility training;Therapeutic exercise;Therapeutic activities    PT Goals (Current goals can be found in the Care Plan section)  Acute Rehab PT Goals PT Goal Formulation: With patient Time For Goal Achievement: 06/02/17 Potential to Achieve Goals: Good    Frequency Min 3X/week   Barriers to discharge        Co-evaluation               AM-PAC PT "6 Clicks" Daily Activity  Outcome Measure Difficulty turning over in bed (including adjusting bedclothes, sheets and blankets)?: A Little Difficulty moving from lying on back to sitting on the side of the bed? : A Little Difficulty sitting down on and standing up from a chair with arms (e.g., wheelchair, bedside commode, etc,.)?:  Unable Help needed moving to and from a bed to chair (including a wheelchair)?: A Little Help needed walking in hospital room?: A Little Help needed climbing 3-5 steps with a railing? : A Lot 6 Click Score: 15    End of Session Equipment Utilized During Treatment: Gait belt Activity Tolerance: Patient limited by fatigue Patient left: with call bell/phone within reach;in bed   PT Visit Diagnosis: Other abnormalities of gait and mobility (R26.89);Muscle weakness (generalized) (M62.81)    Time: 1436-1450 PT Time Calculation (min) (ACUTE ONLY): 14 min   Charges:   PT Evaluation $PT Eval Low Complexity: 1 Low     PT G Codes:        Carmelia Bake, PT, DPT 05/19/2017 Pager: [phone]  York Ram E 05/19/2017, 4:06 PM

## 2017-05-19 NOTE — Progress Notes (Signed)
Pt is active with [facility] for HHPT. Will need resumption orders at discharge. Marney Doctor RN,BSN,NCM [phone]

## 2017-05-19 NOTE — H&P (Signed)
[name] ADMISSION NOTE  Patient Care Team: Patient, No Pcp Per as PCP - General (General Practice)  CHIEF COMPLAINTS/PURPOSE OF ADMISSION Diffuse large B-cell lymphoma, for cycle 6 of chemotherapy  HISTORY OF PRESENTING ILLNESS:  Katrina [name] 67 y.o. female is admitted for high dose, inpatient chemotherapy Summary of oncologic history as follows:   Diffuse large B cell lymphoma ([place])   12/12/2016 Imaging    CT scan showed  1. Extensive mesenteric, retroperitoneal, left hilar, mediastinal, left supraclavicular and left axillary adenopathy. Differential considerations include metastatic adenopathy, lymphoma and leukemia. The size of the nodes in the mesentery and retroperitoneum are suggestive of non-Hodgkin's lymphoma. 2. 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 Differential considerations include metastatic disease and lymphoma. A left upper lobe primary lung carcinoma is a possibility. 3. 3.2 cm solid right renal mass. Differential considerations include renal cell carcinoma and oncocytoma. 4. Small amount of free peritoneal fluid, most likely due to lymphatic obstruction by the mesenteric adenopathy. 5. Left fourth rib fracture. 6. Colonic diverticulosis. 7. Calcific coronary artery and aortic atherosclerosis. Aortic Atherosclerosis (ICD10-I70.0).       12/12/2016 - 12/14/2016 Hospital Admission    He was briefly admitted and evaluated for weakness.  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 Tumor fills the right neural foramina at T6-7 and T7-8 and completely replaces the T7 vertebral body.       12/18/2016 Surgery    She underwent procedure: Decompressive thoracic laminectomy from T5-T8 for resection of epidural tumor        12/18/2016 Pathology Results    1. Soft tissue mass, simple excision, thoracic dorsal epidural mass - DIFFUSE LARGE B-CELL LYMPHOMA. - SEE ONCOLOGY TABLE. 2. Soft tissue mass, simple excision, thoracic dorsal epidural mass - DIFFUSE LARGE B-CELL LYMPHOMA. Microscopic Comment 1. LYMPHOMA Histologic type: Non-Hodgkin B-cell lymphoma: Diffuse large B-cell lymphoma. Grade (if applicable): High grade. Flow cytometry: N/A. Immunohistochemical stains: CD20. CD3, CD5, CD10, CD23, CD30, CD15, PAX-5, Ki-67, bcl-2, bcl-6, CD34. Touch preps/imprints: N/A. Comments: There is a diffuse infiltrate of atypical lymphocytes. The lymphocytes are medium to large in size with irregular nuclear contours. There are background smaller lymphocytes. While there are admixed smaller cells, there are diffuse areas of large cells. The large lymphocytes are positive for CD20, CD10, bcl-6, and bcl-2. CD3 and CD5 highlight admixed T-cells. CD34, CD30, CD23, and CD15 are negative. PAX-5 is positive. Ki-67 is variable with areas up to 70%. 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       12/18/2016 - 01/17/2017 Hospital Admission    The patient was readmitted to the hospital due to complete weakness secondary to spinal cord compression.  She underwent surgery and pathology report confirmed diagnosis of diffuse large B-cell lymphoma. She received IT chemo on 01/16/17      01/16/2017 Pathology Results    CEREBROSPINAL FLUID (SPECIMEN 1 OF 1 COLLECTED 01/16/17): NO MALIGNANT CELLS IDENTIFIED.      02/03/2017 - 02/07/2017 Hospital Admission    She is admitted for  cycle 2 of chemo      02/24/2017 - 02/28/2017 Hospital  Admission    She is admitted for cycle 3 of chemotherapy      03/13/2017 PET scan    1. Low level FDG uptake associated with previous areas of soft tissue tumor within the chest and abdomen. Deauville criteria 2 and 3 compatible with treated tumor. No sites of residual metabolically active tumor (Deauville criteria 4 or 5) identified. 2. Mild diffuse uptake throughout the bone marrow is noted which is nonspecific in may reflect treatment related changes. 3. Stable size of previously noted complex lesion arising from the posterior cortex of the right kidney which now measures fluid attenuation. This is incompletely characterized on today's given lack of IV contrast material. 4. Aortic Atherosclerosis (ICD10-I70.0). Multi vessel coronary artery calcifications noted.      03/13/2017 Imaging    Study Conclusions  - Procedure narrative: Transthoracic echocardiography. Image quality was suboptimal. The study was technically difficult, as a result of poor acoustic windows, poor sound wave transmission, chest wall deformity, and body habitus. - Left ventricle: GLLS is normal at -19% The cavity size was normal. There was mild focal basal hypertrophy of the septum. Systolic function was vigorous. The estimated ejection fraction was in the range of 65% to 70%. Wall motion was normal; there were no regional wall motion abnormalities. There was an increased relative contribution of atrial contraction to ventricular filling. Doppler parameters are consistent with abnormal left ventricular relaxation (grade 1 diastolic dysfunction). - Pulmonary arteries: Systolic pressure could not be accurately estimated.      03/24/2017 - 03/28/2017 Hospital Admission    The patient was admitted to the hospital for cycle 4 of chemotherapy      04/21/2017 - 04/25/2017 Hospital Admission    She is admitted for cycle 5 of R-EPOCH      Overall, she feels well. She had no symptoms since I saw her last week.  Her energy level is  fair.  She denies constipation.  MEDICAL HISTORY:  Past Medical History:  Diagnosis Date  . Anemia   . Arthritis   . 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 Hypertension   . Lymphadenopathy 11/2016   "extensive"  . Paresthesia of lower extremity 11/2016   bilateral  . Pulmonary nodules 11/2016  . Right renal mass 11/2016    SURGICAL HISTORY: Past Surgical History:  Procedure Laterality Date  . ABDOMINAL HYSTERECTOMY    . BREAST LUMPECTOMY     left  . COLONOSCOPY N/A 11/02/2013   Procedure: COLONOSCOPY;  Surgeon: Danie Binder, MD;  [facility];  Service: Endoscopy;  Laterality: N/A;  1:15 - moved to [facility] notified pt  . IR FLUORO GUIDE PORT INSERTION RIGHT  12/30/2016  . IR US GUIDE VASC ACCESS RIGHT  12/30/2016  . LAMINECTOMY N/A 12/18/2016   Procedure: DECOMPRESSION T5-8/THORACIC;  Surgeon: Kary Kos, MD;  Location: [facility];  Service: Neurosurgery;  Laterality: N/A;  Decompressive Thoracic Laminectomy, Thoracic five - six, Thoraic six - seven, Thoracic seven - eight  . SHOULDER SURGERY     right    SOCIAL HISTORY: Social History   Socioeconomic History  . Marital status: Widowed    Spouse name: Not on file  . Number of children: Not on file  . Years of education: Not on file  . Highest education level: Not on file  Occupational History  . Not on file  Social Needs  . Financial resource strain: Not on file  .  Food insecurity:    Worry: Not on file    Inability: Not on file  . Transportation needs:    Medical: Not on file    Non-medical: Not on file  Tobacco Use  . Smoking status: Never Smoker  . Smokeless tobacco: Never Used  Substance and Sexual Activity  . Alcohol use: No  . Drug use: No  . Sexual activity: Never  Lifestyle  . Physical activity:    Days per week: Not on file    Minutes per session: Not on file  . Stress: Not on file  Relationships  . Social connections:    Talks on phone: Not on file    Gets together: Not on file    Attends  religious service: Not on file    Active member of club or organization: Not on file    Attends meetings of clubs or organizations: Not on file    Relationship status: Not on file  . Intimate partner violence:    Fear of current or ex partner: Not on file    Emotionally abused: Not on file    Physically abused: Not on file    Forced sexual activity: Not on file  Other Topics Concern  . Not on file  Social History Narrative  . Not on file    FAMILY HISTORY: Family History  Problem Relation Age of Onset  . Colon cancer Sister        at age 74  . Colon cancer Brother        at age 34  . COPD Mother   . Diabetes Mellitus II Mother   . CAD Mother        Angina  . Colon cancer Father   . Thyroid disease Sister   . Diabetes Mellitus II Sister     ALLERGIES:  is allergic to contrast media [iodinated diagnostic agents].  MEDICATIONS:  Current Facility-Administered Medications  Medication Dose Route Frequency Provider Last Rate Last Dose  . 0.9 %  sodium chloride infusion   Intravenous Continuous Alvy Bimler, Esaias Cleavenger, MD      . acetaminophen (TYLENOL) tablet 650 mg  650 mg Oral Q4H PRN Alvy Bimler, Lorena Benham, MD      . acyclovir (ZOVIRAX) tablet 400 mg  400 mg Oral BID Alvy Bimler, Claudell Rhody, MD      . cyclobenzaprine (FLEXERIL) tablet 10 mg  10 mg Oral TID PRN Alvy Bimler, Zita Ozimek, MD      . enoxaparin (LOVENOX) injection 40 mg  40 mg Subcutaneous Q24H Phyllistine Domingos, MD      . lidocaine-prilocaine (EMLA) cream 1 application  1 application Topical PRN Alvy Bimler, Aliana Kreischer, MD      . lidocaine-prilocaine (EMLA) cream   Topical Once Alvy Bimler, Rosali Augello, MD      . ondansetron (ZOFRAN) tablet 4-8 mg  4-8 mg Oral Q8H PRN Alvy Bimler, Virtie Bungert, MD       Or  . ondansetron (ZOFRAN-ODT) disintegrating tablet 4-8 mg  4-8 mg Oral Q8H PRN Alvy Bimler, Rachit Grim, MD       Or  . ondansetron (ZOFRAN) injection 4 mg  4 mg Intravenous Q8H PRN Leylah Tarnow, MD       Or  . ondansetron (ZOFRAN) 8 mg in sodium chloride 0.9 % 50 mL IVPB  8 mg Intravenous Q8H PRN Mikisha Roseland, MD       . polyethylene glycol (MIRALAX / GLYCOLAX) packet 17 g  17 g Oral Daily Olney Monier, MD      . senna-docusate (Senokot-S) tablet 1 tablet  1  tablet Oral QHS PRN Heath Lark, MD      . traMADol (ULTRAM) tablet 50 mg  50 mg Oral Q6H PRN Alvy Bimler, , MD        REVIEW OF SYSTEMS:   Constitutional: Denies fevers, chills or abnormal night sweats Eyes: Denies blurriness of vision, double vision or watery eyes Ears, nose, mouth, throat, and face: Denies mucositis or sore throat Respiratory: Denies cough, dyspnea or wheezes Cardiovascular: Denies palpitation, chest discomfort or lower extremity swelling Gastrointestinal:  Denies nausea, heartburn or change in bowel habits Skin: Denies abnormal skin rashes Lymphatics: Denies new lymphadenopathy or easy bruising Neurological:Denies numbness, tingling or new weaknesses Behavioral/Psych: Mood is stable, no new changes  All other systems were reviewed with the patient and are negative.  PHYSICAL EXAMINATION: ECOG PERFORMANCE STATUS: 0 - Asymptomatic  Vitals:   05/19/17 0820  BP: (!) 148/93  Pulse: 93  Resp: 18  SpO2: 100%   Filed Weights   05/19/17 0820  Weight: 187 lb 6.3 oz (85 kg)    GENERAL:alert, no distress and comfortable SKIN: skin color, texture, turgor are normal, no rashes or significant lesions EYES: normal, conjunctiva are pink and non-injected, sclera clear OROPHARYNX:no exudate, no erythema and lips, buccal mucosa, and tongue normal  NECK: supple, thyroid normal size, non-tender, without nodularity LYMPH:  no palpable lymphadenopathy in the cervical, axillary or inguinal LUNGS: clear to auscultation and percussion with normal breathing effort HEART: regular rate & rhythm and no murmurs and no lower extremity edema ABDOMEN:abdomen soft, non-tender and normal bowel sounds Musculoskeletal:no cyanosis of digits and no clubbing  PSYCH: alert & oriented x 3 with fluent speech NEURO: no focal motor/sensory  deficits  LABORATORY DATA:  I have reviewed the data as listed Lab Results  Component Value Date   WBC 8.2 05/16/2017   HGB 9.6 (L) 05/16/2017   HCT 29.3 (L) 05/16/2017   MCV 89.3 05/16/2017   PLT 382 05/16/2017   Recent Labs    03/28/17 0450 04/18/17 0927 04/22/17 0500 05/16/17 1436  NA 139 137 135 137  K 2.8* 4.2 3.5 3.3*  CL 106 97* 99* 99  CO2 _0 30*  GLUCOSE 87 115 129* 110  BUN _1 CREATININE 0.54 1.33* 1.02* 1.02  CALCIUM 8.3* 10.1 8.7* 10.0  GFRNONAA >60 41* 56* 56*  GFRAA >60 47* >60 >60  PROT 5.0* 6.7  --  6.2*  ALBUMIN 3.1* 3.9  --  3.6  AST 65* 28  --  26  ALT 33 13  --  12  ALKPHOS 36* 63  --  63  BILITOT 0.4 0.4  --  0.3    ASSESSMENT & PLAN:   Diffuse large B cell lymphoma (HCC) She has side effects from treatment including constipation and then recently diarrhea, fatigue and severe anemia Overall, her symptoms aretolerable She is admitted todaycycle6ofR-EPOCHchemotherapy with dose adjustment due to her side effects. She will receive rituximab on day 3. She will return on4/8/19for G-CSF support Recent PET/CT scan showed positive response to treatment The risks, benefits, side effects of chemotherapy are discussed with the patient and she agreed to proceed  Anemia due to antineoplastic chemotherapy She does not need blood transfusion Monitor closely  History of chronicconstipation I would continue aggressive laxative therapy  CODE STATUS Full code  DVT prophylaxis Lovenox  Physical weakness secondary to thoracic surgery I will consult physical therapy.  Discharge planning At the end of the week  All questions were answered. The patient knows  to call the clinic with any problems, questions or concerns. I spent 30 minutes counseling the patient face to face. The total time spent in the appointment was 40 minutes and more than 50% was on counseling.     Heath Lark, MD 05/19/2017 8:34 AM

## 2017-05-20 DIAGNOSIS — R531 Weakness: Secondary | ICD-10-CM

## 2017-05-20 MED ORDER — VINCRISTINE SULFATE CHEMO INJECTION 1 MG/ML
Freq: Once | INTRAVENOUS | Status: AC
  Administered 2017-05-20: 12:00:00 via INTRAVENOUS
  Filled 2017-05-20: qty 8

## 2017-05-20 MED ORDER — ONDANSETRON HCL 40 MG/20ML IJ SOLN
Freq: Once | INTRAMUSCULAR | Status: AC
  Administered 2017-05-20: 8 mg via INTRAVENOUS
  Filled 2017-05-20: qty 4

## 2017-05-20 NOTE — Progress Notes (Signed)
Physical Therapy Treatment Patient Details Name: Katrina [name] MRN: [id number] DOB: [birth year] Today's Date: 05/20/2017    History of Present Illness Patient is a 67 y/o female with hx of Diffuse large B-cell lymphoma and admission for cycle 6 of high-dose chemotherapy    PT Comments    Pt reports feeling okay today and happy to ambulate.  Pt reports numbness and tingling in LEs and presents with uncoordinated gait however tolerated well and min/guard for safety.  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 and weakness (L LE > R LE)    Mobility  Bed Mobility Overal bed mobility: Modified Independent Bed Mobility: Rolling;Sidelying to Sit;Sit to Sidelying Rolling: Supervision Sidelying to sit: Supervision       General bed mobility comments: only requries increased time, sitting with nursing EOB end of session  Transfers Overall transfer level: Needs assistance Equipment used: Rolling walker (2 wheeled) Transfers: Sit to/from Stand Sit to Stand: Min assist         General transfer comment: increased time and effort, assist for controlling descent  Ambulation/Gait Ambulation/Gait assistance: Min guard Ambulation Distance (Feet): 100 Feet Assistive device: Rolling walker (2 wheeled) Gait Pattern/deviations: Step-through pattern;Decreased stride length;Wide base of support;Trunk flexed Gait velocity: decreased   General Gait Details: pt uses vision to assist with foot placement, uncoordinated short steps observed, increased reliance on UE support on RW   Stairs            Wheelchair Mobility    Modified Rankin (Stroke Patients Only)       Balance                                             Cognition Arousal/Alertness: Awake/alert Behavior During Therapy: WFL for tasks assessed/performed Overall Cognitive Status: Within Functional Limits for tasks assessed                                        Exercises      General Comments        Pertinent Vitals/Pain Pain Assessment: No/denies pain    Home Living                      Prior Function            PT Goals (current goals can now be found in the care plan section) Progress towards PT goals: Progressing toward goals    Frequency    Min 3X/week      PT Plan Current plan remains appropriate    Co-evaluation              AM-PAC PT "6 Clicks" Daily Activity  Outcome Measure  Difficulty turning over in bed (including adjusting bedclothes, sheets and blankets)?: A Little Difficulty moving from lying on back to sitting on the side of the bed? : A Little Difficulty sitting down on and standing up from a chair with arms (e.g., wheelchair, bedside commode, etc,.)?: A Little Help needed moving to and from a bed to chair (including a wheelchair)?: A  Little Help needed walking in hospital room?: A Little Help needed climbing 3-5 steps with a railing? : A Lot 6 Click Score: 17    End of Session Equipment Utilized During Treatment: Gait belt Activity Tolerance: Patient limited by fatigue Patient left: with call bell/phone within reach;in bed Nurse Communication: Mobility status PT Visit Diagnosis: Other abnormalities of gait and mobility (R26.89);Muscle weakness (generalized) (M62.81)     Time: 5885-0277 PT Time Calculation (min) (ACUTE ONLY): 13 min  Charges:  $Gait Training: 8-22 mins                    G Codes:       Carmelia Bake, PT, DPT 05/20/2017 Pager: [phone]  York Ram E 05/20/2017, 3:22 PM

## 2017-05-20 NOTE — Progress Notes (Signed)
Katrina [name]   DOB:20-Nov-1950   GP#:[id number]    Assessment & Plan:   Diffuse large B cell lymphoma ([place]) She tolerated cycle 6, day 1 of chemotherapy well yesterday.  We will proceed with treatment as scheduled. She will receive rituximab on day 3. She will return on4/8/19for G-CSF support Recent PET/CT scan showed positive response to treatment The risks, benefits, side effects of chemotherapy are discussed with the patient and she agreed to proceed  Anemia due to antineoplastic chemotherapy She does not need blood transfusion Monitor closely Plan to recheck CBC tomorrow  History of chronicconstipation I would continue aggressive laxative therapy  CODE STATUS Full code  DVT prophylaxis Lovenox  Physical weakness secondary to thoracic surgery She will continue physical therapy while hospitalized  Discharge planning At the end of the week    Heath Lark, MD 05/20/2017  8:26 AM   Subjective:  She tolerated treatment well.  Denies nausea or constipation.  She is mobilizing around with physical therapy.  Objective:  Vitals:   05/19/17 2048 05/20/17 0545  BP: 134/79 129/79  Pulse: 96 99  Resp: 20 20  Temp: 98.1 F (36.7 C) 97.8 F (36.6 C)  SpO2: 97% 98%     Intake/Output Summary (Last 24 hours) at 05/20/2017 0826 Last data filed at 05/20/2017 0452 Gross per 24 hour  Intake 1403.5 ml  Output 3 ml  Net 1400.5 ml    GENERAL:alert, no distress and comfortable SKIN: skin color, texture, turgor are normal, no rashes or significant lesions EYES: normal, Conjunctiva are pink and non-injected, sclera clear OROPHARYNX:no exudate, no erythema and lips, buccal mucosa, and tongue normal  NECK: supple, thyroid normal size, non-tender, without nodularity LYMPH:  no palpable lymphadenopathy in the cervical, axillary or inguinal LUNGS: clear to auscultation and percussion with normal breathing effort HEART: regular rate & rhythm and no murmurs and no lower  extremity edema ABDOMEN:abdomen soft, non-tender and normal bowel sounds Musculoskeletal:no cyanosis of digits and no clubbing  NEURO: alert & oriented x 3 with fluent speech, no focal motor/sensory deficits   Labs:  Lab Results  Component Value Date   WBC 8.2 05/16/2017   HGB 9.6 (L) 05/16/2017   HCT 29.3 (L) 05/16/2017   MCV 89.3 05/16/2017   PLT 382 05/16/2017   NEUTROABS 6.5 05/16/2017    Lab Results  Component Value Date   NA 137 05/16/2017   K 3.3 (L) 05/16/2017   CL 99 05/16/2017   CO2 30 (H) 05/16/2017    Studies:  No results found.

## 2017-05-20 NOTE — Progress Notes (Signed)
Chemo dosages and calculations checked with Nancy Marus RN

## 2017-05-20 NOTE — Progress Notes (Signed)
Chemo dosages and calculations checked with Kim Osborne RN. 

## 2017-05-21 DIAGNOSIS — K5909 Other constipation: Secondary | ICD-10-CM

## 2017-05-21 DIAGNOSIS — D6481 Anemia due to antineoplastic chemotherapy: Secondary | ICD-10-CM

## 2017-05-21 DIAGNOSIS — E876 Hypokalemia: Secondary | ICD-10-CM

## 2017-05-21 DIAGNOSIS — C8330 Diffuse large B-cell lymphoma, unspecified site: Secondary | ICD-10-CM

## 2017-05-21 DIAGNOSIS — Z5111 Encounter for antineoplastic chemotherapy: Principal | ICD-10-CM

## 2017-05-21 LAB — CBC WITH DIFFERENTIAL/PLATELET
BASOS ABS: 0 10*3/uL (ref 0.0–0.1)
Basophils Relative: 0 %
EOS ABS: 0 10*3/uL (ref 0.0–0.7)
EOS PCT: 0 %
HCT: 24.7 % — ABNORMAL LOW (ref 36.0–46.0)
Hemoglobin: 8 g/dL — ABNORMAL LOW (ref 12.0–15.0)
Lymphocytes Relative: 7 %
Lymphs Abs: 0.5 10*3/uL — ABNORMAL LOW (ref 0.7–4.0)
MCH: 29.4 pg (ref 26.0–34.0)
MCHC: 32.4 g/dL (ref 30.0–36.0)
MCV: 90.8 fL (ref 78.0–100.0)
MONO ABS: 0.5 10*3/uL (ref 0.1–1.0)
Monocytes Relative: 8 %
Neutro Abs: 5.8 10*3/uL (ref 1.7–7.7)
Neutrophils Relative %: 85 %
PLATELETS: 262 10*3/uL (ref 150–400)
RBC: 2.72 MIL/uL — ABNORMAL LOW (ref 3.87–5.11)
RDW: 15.8 % — AB (ref 11.5–15.5)
WBC: 6.8 10*3/uL (ref 4.0–10.5)

## 2017-05-21 LAB — BASIC METABOLIC PANEL
ANION GAP: 7 (ref 5–15)
BUN: 14 mg/dL (ref 6–20)
CALCIUM: 8.2 mg/dL — AB (ref 8.9–10.3)
CO2: 24 mmol/L (ref 22–32)
Chloride: 109 mmol/L (ref 101–111)
Creatinine, Ser: 0.71 mg/dL (ref 0.44–1.00)
GFR calc Af Amer: >60 mL/min (ref >60)
GFR calc non Af Amer: >60 mL/min (ref >60)
Glucose, Bld: 93 mg/dL (ref 65–99)
Potassium: 3.1 mmol/L — ABNORMAL LOW (ref 3.5–5.1)
SODIUM: 140 mmol/L (ref 135–145)

## 2017-05-21 MED ORDER — ALTEPLASE 2 MG IJ SOLR: 2.0000 mg | Freq: Once | INTRAMUSCULAR | Status: DC | PRN

## 2017-05-21 MED ORDER — SODIUM CHLORIDE 0.9 % IV SOLN
Freq: Once | INTRAVENOUS | Status: AC
  Administered 2017-05-21: 8 mg via INTRAVENOUS
  Filled 2017-05-21: qty 4

## 2017-05-21 MED ORDER — ALBUTEROL SULFATE (2.5 MG/3ML) 0.083% IN NEBU: 2.5000 mg | INHALATION_SOLUTION | Freq: Once | RESPIRATORY_TRACT | Status: DC | PRN

## 2017-05-21 MED ORDER — SODIUM CHLORIDE 0.9 % IV SOLN: Freq: Once | INTRAVENOUS | Status: DC | PRN

## 2017-05-21 MED ORDER — POTASSIUM CHLORIDE CRYS ER 20 MEQ PO TBCR
20.0000 meq | EXTENDED_RELEASE_TABLET | Freq: Every day | ORAL | Status: DC
  Administered 2017-05-21: 20 meq via ORAL
  Filled 2017-05-21: qty 1

## 2017-05-21 MED ORDER — FAMOTIDINE IN NACL 20-0.9 MG/50ML-% IV SOLN: 20.0000 mg | Freq: Once | INTRAVENOUS | Status: DC | PRN

## 2017-05-21 MED ORDER — DIPHENHYDRAMINE HCL 50 MG PO CAPS
50.0000 mg | ORAL_CAPSULE | Freq: Once | ORAL | Status: AC
  Administered 2017-05-21: 50 mg via ORAL
  Filled 2017-05-21: qty 1

## 2017-05-21 MED ORDER — EPINEPHRINE PF 1 MG/10ML IJ SOSY: 0.2500 mg | PREFILLED_SYRINGE | Freq: Once | INTRAMUSCULAR | Status: DC | PRN

## 2017-05-21 MED ORDER — RITUXIMAB CHEMO INJECTION 500 MG/50ML
375.0000 mg/m2 | Freq: Once | INTRAVENOUS | Status: AC
  Administered 2017-05-21: 800 mg via INTRAVENOUS
  Filled 2017-05-21: qty 50

## 2017-05-21 MED ORDER — EPINEPHRINE PF 1 MG/ML IJ SOLN: 0.5000 mg | Freq: Once | INTRAMUSCULAR | Status: DC | PRN

## 2017-05-21 MED ORDER — SODIUM CHLORIDE 0.9% FLUSH: 10.0000 mL | INTRAVENOUS | Status: DC | PRN

## 2017-05-21 MED ORDER — METHYLPREDNISOLONE SODIUM SUCC 125 MG IJ SOLR: 125.0000 mg | Freq: Once | INTRAMUSCULAR | Status: DC | PRN

## 2017-05-21 MED ORDER — SODIUM CHLORIDE 0.9% FLUSH: 3.0000 mL | INTRAVENOUS | Status: DC | PRN

## 2017-05-21 MED ORDER — VINCRISTINE SULFATE CHEMO INJECTION 1 MG/ML
Freq: Once | INTRAVENOUS | Status: AC
  Administered 2017-05-21: 16:00:00 via INTRAVENOUS
  Filled 2017-05-21: qty 8

## 2017-05-21 MED ORDER — HEPARIN SOD (PORK) LOCK FLUSH 100 UNIT/ML IV SOLN: 500.0000 [IU] | Freq: Once | INTRAVENOUS | Status: DC | PRN

## 2017-05-21 MED ORDER — DIPHENHYDRAMINE HCL 50 MG/ML IJ SOLN: 25.0000 mg | Freq: Once | INTRAMUSCULAR | Status: DC | PRN

## 2017-05-21 MED ORDER — DIPHENHYDRAMINE HCL 50 MG/ML IJ SOLN: 50.0000 mg | Freq: Once | INTRAMUSCULAR | Status: DC | PRN

## 2017-05-21 MED ORDER — ACETAMINOPHEN 325 MG PO TABS
650.0000 mg | ORAL_TABLET | Freq: Once | ORAL | Status: AC
Start: 2017-05-21 — End: 2017-05-21
  Administered 2017-05-21: 650 mg via ORAL
  Filled 2017-05-21: qty 2

## 2017-05-21 MED ORDER — HEPARIN SOD (PORK) LOCK FLUSH 100 UNIT/ML IV SOLN: 250.0000 [IU] | Freq: Once | INTRAVENOUS | Status: DC | PRN

## 2017-05-21 MED ORDER — SODIUM CHLORIDE 0.9 % IV SOLN
Freq: Once | INTRAVENOUS | Status: AC
  Administered 2017-05-21: 11:00:00 via INTRAVENOUS

## 2017-05-21 MED ORDER — SENNOSIDES-DOCUSATE SODIUM 8.6-50 MG PO TABS
2.0000 | ORAL_TABLET | Freq: Two times a day (BID) | ORAL | Status: DC
  Administered 2017-05-21 – 2017-05-23 (×5): 2 via ORAL
  Filled 2017-05-21 (×5): qty 2

## 2017-05-21 NOTE — Progress Notes (Signed)
rituxan dosage and caluculations verified with Vergia Alcon., RN

## 2017-05-21 NOTE — Progress Notes (Signed)
Rituxan infusion complete, pt tolerated well.

## 2017-05-21 NOTE — Progress Notes (Signed)
Doxorubicin, etoposide, vincristine dosages and calculations verified with Vergia Alcon., RN.

## 2017-05-21 NOTE — Progress Notes (Signed)
Katrina [name]   DOB:[birth year]   QM#:[id number]    Assessment & Plan:   Diffuse large B cell lymphoma ([place]) She tolerated cycle 6, day 2 of chemotherapy well yesterday.  We will proceed with treatment as scheduled. She will receive rituximab on day 3. She will return on4/8/19for G-CSF support Recent PET/CT scan showed positive response to treatment The risks, benefits, side effects of chemotherapy are discussed with the patient and she agreed to proceed  Anemia due to antineoplastic chemotherapy She does not need blood transfusion This is likely due to side effects of treatment and possible hemodilution.  I will stop IV fluids Monitor closely Plan to recheck CBC tomorrow  Hypokalemia We will replace potassium Recheck tomorrow and magnesium level  History of chronicconstipation I would continue aggressive laxative therapy.  I would add Senokot  CODE STATUS Full code  DVT prophylaxis Lovenox  Physical weakness secondary to thoracic surgery She will continue physical therapy while hospitalized  Discharge planning At the end of the week  Heath Lark, MD 05/21/2017  8:09 AM   Subjective:  She feels well.  She complained of mild constipation.  Denies nausea.  No shortness of breath.  She complained of mild lower extremity edema  Objective:  Vitals:   05/20/17 2024 05/21/17 0449  BP: 116/62 125/68  Pulse: 86 75  Resp: 16 14  Temp: 98.1 F (36.7 C) 97.7 F (36.5 C)  SpO2: 98% 99%     Intake/Output Summary (Last 24 hours) at 05/21/2017 0809 Last data filed at 05/20/2017 1600 Gross per 24 hour  Intake 1173.7 ml  Output -  Net 1173.7 ml    GENERAL:alert, no distress and comfortable SKIN: skin color, texture, turgor are normal, no rashes or significant lesions EYES: normal, Conjunctiva are pink and non-injected, sclera clear OROPHARYNX:no exudate, no erythema and lips, buccal mucosa, and tongue normal  NECK: supple, thyroid normal size, non-tender, without  nodularity LYMPH:  no palpable lymphadenopathy in the cervical, axillary or inguinal LUNGS: clear to auscultation and percussion with normal breathing effort HEART: regular rate & rhythm and no murmurs with mild lower extremity edema ABDOMEN:abdomen soft, non-tender and normal bowel sounds Musculoskeletal:no cyanosis of digits and no clubbing  NEURO: alert & oriented x 3 with fluent speech, no focal motor/sensory deficits   Labs:  Lab Results  Component Value Date   WBC 8.2 05/16/2017   HGB 9.6 (L) 05/16/2017   HCT 29.3 (L) 05/16/2017   MCV 89.3 05/16/2017   PLT 382 05/16/2017   NEUTROABS 6.5 05/16/2017    Lab Results  Component Value Date   NA 137 05/16/2017   K 3.3 (L) 05/16/2017   CL 99 05/16/2017   CO2 30 (H) 05/16/2017

## 2017-05-22 DIAGNOSIS — K5900 Constipation, unspecified: Secondary | ICD-10-CM

## 2017-05-22 LAB — CBC WITH DIFFERENTIAL/PLATELET
BASOS PCT: 0 %
Basophils Absolute: 0 10*3/uL (ref 0.0–0.1)
EOS ABS: 0 10*3/uL (ref 0.0–0.7)
Eosinophils Relative: 0 %
HCT: 25 % — ABNORMAL LOW (ref 36.0–46.0)
HEMOGLOBIN: 8.5 g/dL — AB (ref 12.0–15.0)
Lymphocytes Relative: 6 %
Lymphs Abs: 0.3 10*3/uL — ABNORMAL LOW (ref 0.7–4.0)
MCH: 30 pg (ref 26.0–34.0)
MCHC: 34 g/dL (ref 30.0–36.0)
MCV: 88.3 fL (ref 78.0–100.0)
MONOS PCT: 4 %
Monocytes Absolute: 0.2 10*3/uL (ref 0.1–1.0)
NEUTROS PCT: 90 %
Neutro Abs: 5 10*3/uL (ref 1.7–7.7)
PLATELETS: 279 10*3/uL (ref 150–400)
RBC: 2.83 MIL/uL — ABNORMAL LOW (ref 3.87–5.11)
RDW: 15.3 % (ref 11.5–15.5)
WBC: 5.5 10*3/uL (ref 4.0–10.5)

## 2017-05-22 LAB — TYPE AND SCREEN
ABO/RH(D): O NEG
Antibody Screen: NEGATIVE

## 2017-05-22 LAB — BASIC METABOLIC PANEL
Anion gap: 9 (ref 5–15)
BUN: 18 mg/dL (ref 6–20)
CHLORIDE: 106 mmol/L (ref 101–111)
CO2: 24 mmol/L (ref 22–32)
CREATININE: 0.81 mg/dL (ref 0.44–1.00)
Calcium: 8.6 mg/dL — ABNORMAL LOW (ref 8.9–10.3)
Glucose, Bld: 123 mg/dL — ABNORMAL HIGH (ref 65–99)
Potassium: 3.9 mmol/L (ref 3.5–5.1)
SODIUM: 139 mmol/L (ref 135–145)

## 2017-05-22 LAB — MAGNESIUM: MAGNESIUM: 1.8 mg/dL (ref 1.7–2.4)

## 2017-05-22 MED ORDER — VINCRISTINE SULFATE CHEMO INJECTION 1 MG/ML
Freq: Once | INTRAVENOUS | Status: AC
  Administered 2017-05-22: 15:00:00 via INTRAVENOUS
  Filled 2017-05-22: qty 8

## 2017-05-22 MED ORDER — ONDANSETRON HCL 40 MG/20ML IJ SOLN
Freq: Once | INTRAMUSCULAR | Status: AC
  Administered 2017-05-22: 8 mg via INTRAVENOUS
  Filled 2017-05-22: qty 4

## 2017-05-22 NOTE — Progress Notes (Signed)
Katrina [name]   DOB:1950-07-31   BM#:[id number]    Assessment & Plan:   Diffuse large B cell lymphoma ([place]) She tolerated cycle 6, day 3 of chemotherapy well yesterday. We will proceed with treatment as scheduled. She has received rituximab on day 3. She will return on4/8/19for G-CSF support Recent PET/CT scan showed positive response to treatment The risks, benefits, side effects of chemotherapy are discussed with the patient and she agreed to proceed  Anemia due to antineoplastic chemotherapy She does not need blood transfusion  Hypokalemia, resolved  History of chronicconstipation I would continue aggressive laxative therapy.    CODE STATUS Full code  DVT prophylaxis Lovenox  Physical weakness secondary to thoracic surgery She will continue physical therapy while hospitalized  Discharge planning At the end of the week   Heath Lark, MD 05/22/2017  8:14 AM   Subjective:  She tolerated chemotherapy well.  No nausea.  Constipation is improving with additional laxatives.  Objective:  Vitals:   05/21/17 2052 05/22/17 0438  BP: 124/76 136/74  Pulse: 81 76  Resp: 16 14  Temp: 98.6 F (37 C) 97.6 F (36.4 C)  SpO2: 97% 95%     Intake/Output Summary (Last 24 hours) at 05/22/2017 0814 Last data filed at 05/21/2017 1700 Gross per 24 hour  Intake 404.15 ml  Output -  Net 404.15 ml    GENERAL:alert, no distress and comfortable SKIN: skin color, texture, turgor are normal, no rashes or significant lesions EYES: normal, Conjunctiva are pink and non-injected, sclera clear OROPHARYNX:no exudate, no erythema and lips, buccal mucosa, and tongue normal  NECK: supple, thyroid normal size, non-tender, without nodularity LYMPH:  no palpable lymphadenopathy in the cervical, axillary or inguinal LUNGS: clear to auscultation and percussion with normal breathing effort HEART: regular rate & rhythm and no murmurs and no lower extremity edema ABDOMEN:abdomen soft,  non-tender and normal bowel sounds Musculoskeletal:no cyanosis of digits and no clubbing  NEURO: alert & oriented x 3 with fluent speech, no focal motor/sensory deficits   Labs:  Lab Results  Component Value Date   WBC 5.5 05/22/2017   HGB 8.5 (L) 05/22/2017   HCT 25.0 (L) 05/22/2017   MCV 88.3 05/22/2017   PLT 279 05/22/2017   NEUTROABS 5.0 05/22/2017    Lab Results  Component Value Date   NA 139 05/22/2017   K 3.9 05/22/2017   CL 106 05/22/2017   CO2 24 05/22/2017

## 2017-05-22 NOTE — Care Management Important Message (Signed)
Important Message  Patient Details  Name: Katrina [name] MRN: [id number] Date of Birth: [birth year]   Medicare Important Message Given:  Yes    Kerin Salen 05/22/2017, 12:05 [place] Message  Patient Details  Name: Katrina [name] MRN: [id number] Date of Birth: 31-Dec-1950   Medicare Important Message Given:  Yes    Kerin Salen 05/22/2017, 12:05 PM

## 2017-05-22 NOTE — Progress Notes (Signed)
Physical Therapy Treatment Patient Details Name: Katrina [name] MRN: [id number] DOB: 06-30-1950 Today's Date: 05/22/2017    History of Present Illness Patient is a 67 y/o female with hx of Diffuse large B-cell lymphoma and admission for cycle 6 of high-dose chemotherapy    PT Comments    Pt reports she is doing well today and able to ambulate to tolerance in hallway.     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)    Mobility  Bed Mobility Overal bed mobility: Modified Independent             General bed mobility comments: only requries increased time  Transfers Overall transfer level: Needs assistance Equipment used: Rolling walker (2 wheeled) Transfers: Sit to/from Stand Sit to Stand: Min guard         General transfer comment: increased time and effort, min/guard for safety  Ambulation/Gait Ambulation/Gait assistance: Min guard Ambulation Distance (Feet): 80 Feet Assistive device: Rolling walker (2 wheeled) Gait Pattern/deviations: Step-through pattern;Decreased stride length;Wide base of support;Trunk flexed Gait velocity: decreased   General Gait Details: pt uses vision to assist with foot placement, uncoordinated short steps observed, increased reliance on UE support on RW   Stairs            Wheelchair Mobility    Modified Rankin (Stroke Patients Only)       Balance                                            Cognition Arousal/Alertness: Awake/alert Behavior During Therapy: WFL for tasks assessed/performed Overall Cognitive Status: Within Functional Limits for tasks assessed                                        Exercises      General  Comments        Pertinent Vitals/Pain Pain Assessment: No/denies pain    Home Living                      Prior Function            PT Goals (current goals can now be found in the care plan section) Progress towards PT goals: Progressing toward goals    Frequency    Min 3X/week      PT Plan Discharge plan needs to be updated    Co-evaluation              AM-PAC PT "6 Clicks" Daily Activity  Outcome Measure  Difficulty turning over in bed (including adjusting bedclothes, sheets and blankets)?: A Little Difficulty moving from lying on back to sitting on the side of the bed? : A Little Difficulty sitting down on and standing up from a chair with arms (e.g., wheelchair, bedside commode, etc,.)?: A Little Help needed moving to and from a bed to chair (including a wheelchair)?: A Little Help needed walking in hospital room?: A Little Help needed climbing 3-5 steps with a railing? : A Lot 6 Click Score: 17  End of Session Equipment Utilized During Treatment: Gait belt Activity Tolerance: Patient limited by fatigue Patient left: with call bell/phone within reach;in bed Nurse Communication: Mobility status PT Visit Diagnosis: Other abnormalities of gait and mobility (R26.89);Muscle weakness (generalized) (M62.81)     Time: 3837-7939 PT Time Calculation (min) (ACUTE ONLY): 13 min  Charges:  $Gait Training: 8-22 mins                    G Codes:       Carmelia Bake, PT, DPT 05/22/2017 Pager: [phone]  York Ram E 05/22/2017, 11:16 AM

## 2017-05-22 NOTE — Progress Notes (Signed)
Chemotherapy dosage and calculations checked and reviewed with Drue Dun, RN

## 2017-05-23 DIAGNOSIS — G8220 Paraplegia, unspecified: Secondary | ICD-10-CM

## 2017-05-23 DIAGNOSIS — R209 Unspecified disturbances of skin sensation: Secondary | ICD-10-CM

## 2017-05-23 DIAGNOSIS — T451X5A Adverse effect of antineoplastic and immunosuppressive drugs, initial encounter: Secondary | ICD-10-CM

## 2017-05-23 MED ORDER — ONDANSETRON HCL 40 MG/20ML IJ SOLN
Freq: Once | INTRAMUSCULAR | Status: AC
  Administered 2017-05-23: 16 mg via INTRAVENOUS
  Filled 2017-05-23: qty 8

## 2017-05-23 MED ORDER — SODIUM CHLORIDE 0.9 % IV SOLN
600.0000 mg/m2 | Freq: Once | INTRAVENOUS | Status: AC
  Administered 2017-05-23: 1240 mg via INTRAVENOUS
  Filled 2017-05-23: qty 62

## 2017-05-23 NOTE — Discharge Summary (Signed)
Physician Discharge Summary  Patient ID: Katrina [name] MRN: [id number] DOB/AGE: 1950/06/24 67 y.o.  Admit date: 05/19/2017 Discharge date: 05/23/2017  Primary Care Physician:  Patient, No Pcp Per   Discharge Diagnoses:    Present on Admission: . Diffuse large B cell lymphoma ([place]) . Anemia due to antineoplastic chemotherapy . Paraparesis ([place]) . Numbness and tingling of both lower extremities   Discharge Medications:  Allergies as of 05/23/2017      Reactions   Contrast Media [iodinated Diagnostic Agents] Hives, Itching      Medication List    TAKE these medications   acyclovir 400 MG tablet Commonly known as:  ZOVIRAX Take 1 tablet (400 mg total) by mouth 2 (two) times daily.   cyclobenzaprine 10 MG tablet Commonly known as:  FLEXERIL Take 1 tablet (10 mg total) by mouth 3 (three) times daily as needed for muscle spasms.   Lactulose 20 GM/30ML Soln Take 30 mLs (20 g total) by mouth 3 (three) times daily.   lidocaine-prilocaine cream Commonly known as:  EMLA Apply 1 application topically as needed. What changed:  reasons to take this   morphine 15 MG 12 hr tablet Commonly known as:  MS CONTIN Take 1 tablet (15 mg total) by mouth every 12 (twelve) hours. What changed:    when to take this  reasons to take this   ondansetron 8 MG tablet Commonly known as:  ZOFRAN Take 1 tablet (8 mg total) by mouth every 8 (eight) hours as needed for nausea (not responsive to prochlorperazine (COMPAZINE)).   polyethylene glycol packet Commonly known as:  MIRALAX / GLYCOLAX Take 17 g by mouth daily.   predniSONE 50 MG tablet Commonly known as:  DELTASONE Take 1 pill at 13 hours, 7 hours and 1 hour before CT scan   prochlorperazine 10 MG tablet Commonly known as:  COMPAZINE Take 1 tablet (10 mg total) by mouth every 6 (six) hours as needed for nausea or vomiting.   tetrahydrozoline 0.05 % ophthalmic solution Place 1-2 drops into both eyes 4 (four) times daily  as needed (For eye irritation.).   traMADol 50 MG tablet Commonly known as:  ULTRAM TAKE 2 TABLETS BY MOUTH EVERY 6 HOURS AS NEEDED FOR SEVERE PAIN   triamterene-hydrochlorothiazide 37.5-25 MG capsule Commonly known as:  DYAZIDE Take 1 each (1 capsule total) by mouth daily.       Disposition and Follow-up: She will see me back within the month after discharge for further follow-up  Significant Diagnostic Studies:  No results found.  Discharge Laboratory Values: Lab Results  Component Value Date   WBC 5.5 05/22/2017   HGB 8.5 (L) 05/22/2017   HCT 25.0 (L) 05/22/2017   MCV 88.3 05/22/2017   PLT 279 05/22/2017   Lab Results  Component Value Date   NA 139 05/22/2017   K 3.9 05/22/2017   CL 106 05/22/2017   CO2 24 05/22/2017    Brief H and P: For complete details please refer to admission H and P, but in brief, she was admitted to the hospital from May 19, 2017 to May 23, 2017 for cycle 6 of chemotherapy.  Physical Exam at Discharge: BP 138/81 (BP Location: Left Arm)   Pulse 85   Temp 97.9 F (36.6 C) (Oral)   Resp 20   Ht 5\' 8"  (1.727 m)   Wt 187 lb 6.3 oz (85 kg)   SpO2 100%   BMI 28.49 kg/m  GENERAL:alert, no distress and comfortable SKIN: skin color,  texture, turgor are normal, no rashes or significant lesions EYES: normal, Conjunctiva are pink and non-injected, sclera clear OROPHARYNX:no exudate, no erythema and lips, buccal mucosa, and tongue normal  NECK: supple, thyroid normal size, non-tender, without nodularity LYMPH:  no palpable lymphadenopathy in the cervical, axillary or inguinal LUNGS: clear to auscultation and percussion with normal breathing effort HEART: regular rate & rhythm and no murmurs and no lower extremity edema ABDOMEN:abdomen soft, non-tender and normal bowel sounds Musculoskeletal:no cyanosis of digits and no clubbing  NEURO: alert & oriented x 3 with fluent speech, no focal motor/sensory deficits  Hospital Course:  Active  Problems:   Diffuse large B cell lymphoma ([facility])   Numbness and tingling of both lower extremities   Paraparesis ([facility])   Anemia due to antineoplastic chemotherapy  Diffuse large B cell lymphoma ([facility]) She tolerated chemotherapy well without major side effects.  She will return on 05/26/2017 to the cancer center for G-CSF support  Anemia due to antineoplastic chemotherapy She does not need blood transfusion  Hypokalemia, resolved  History of chronicconstipation She will continue aggressive laxative therapy at home.   Discharge planning   Diet:  Regular  Activity:  As tolerated  Condition at Discharge:   stable  Signed: Dr. Heath Lark [phone]  05/23/2017, 8:29 AM

## 2017-05-23 NOTE — Progress Notes (Signed)
See discharge summary/note

## 2017-05-23 NOTE — Progress Notes (Signed)
Dosage and dilution for Cytoxan verified with Reyne Dumas, RN.

## 2017-05-25 ENCOUNTER — Other Ambulatory Visit: Payer: Self-pay | Admitting: Hematology and Oncology

## 2017-05-26 ENCOUNTER — Inpatient Hospital Stay: Payer: Medicare Other | Attending: Hematology and Oncology

## 2017-05-26 VITALS — BP 128/83 | HR 100 | Temp 98.1°F | Resp 18

## 2017-05-26 DIAGNOSIS — Z5189 Encounter for other specified aftercare: Secondary | ICD-10-CM | POA: Diagnosis not present

## 2017-05-26 DIAGNOSIS — C8330 Diffuse large B-cell lymphoma, unspecified site: Secondary | ICD-10-CM | POA: Diagnosis not present

## 2017-05-26 MED ORDER — PEGFILGRASTIM INJECTION 6 MG/0.6ML ~~LOC~~
PREFILLED_SYRINGE | SUBCUTANEOUS | Status: AC
  Filled 2017-05-26: qty 0.6

## 2017-05-26 MED ORDER — PEGFILGRASTIM INJECTION 6 MG/0.6ML ~~LOC~~
6.0000 mg | PREFILLED_SYRINGE | Freq: Once | SUBCUTANEOUS | Status: AC
  Administered 2017-05-26: 6 mg via SUBCUTANEOUS

## 2017-05-26 NOTE — Patient Instructions (Signed)
Pegfilgrastim injection What is this medicine? PEGFILGRASTIM (PEG fil gra stim) is a long-acting granulocyte colony-stimulating factor that stimulates the growth of neutrophils, a type of white blood cell important in the body's fight against infection. It is used to reduce the incidence of fever and infection in patients with certain types of cancer who are receiving chemotherapy that affects the bone marrow, and to increase survival after being exposed to high doses of radiation. This medicine may be used for other purposes; ask your health care provider or pharmacist if you have questions. COMMON BRAND NAME(S): Neulasta What should I tell my health care provider before I take this medicine? They need to know if you have any of these conditions: -kidney disease -latex allergy -ongoing radiation therapy -sickle cell disease -skin reactions to acrylic adhesives (On-Body Injector only) -an unusual or allergic reaction to pegfilgrastim, filgrastim, other medicines, foods, dyes, or preservatives -pregnant or trying to get pregnant -breast-feeding How should I use this medicine? This medicine is for injection under the skin. If you get this medicine at home, you will be taught how to prepare and give the pre-filled syringe or how to use the On-body Injector. Refer to the patient Instructions for Use for detailed instructions. Use exactly as directed. Tell your healthcare provider immediately if you suspect that the On-body Injector may not have performed as intended or if you suspect the use of the On-body Injector resulted in a missed or partial dose. It is important that you put your used needles and syringes in a special sharps container. Do not put them in a trash can. If you do not have a sharps container, call your pharmacist or healthcare provider to get one. Talk to your pediatrician regarding the use of this medicine in children. While this drug may be prescribed for selected conditions,  precautions do apply. Overdosage: If you think you have taken too much of this medicine contact a poison control center or emergency room at once. NOTE: This medicine is only for you. Do not share this medicine with others. What if I miss a dose? It is important not to miss your dose. Call your doctor or health care professional if you miss your dose. If you miss a dose due to an On-body Injector failure or leakage, a new dose should be administered as soon as possible using a single prefilled syringe for manual use. What may interact with this medicine? Interactions have not been studied. Give your health care provider a list of all the medicines, herbs, non-prescription drugs, or dietary supplements you use. Also tell them if you smoke, drink alcohol, or use illegal drugs. Some items may interact with your medicine. This list may not describe all possible interactions. Give your health care provider a list of all the medicines, herbs, non-prescription drugs, or dietary supplements you use. Also tell them if you smoke, drink alcohol, or use illegal drugs. Some items may interact with your medicine. What should I watch for while using this medicine? You may need blood work done while you are taking this medicine. If you are going to need a MRI, CT scan, or other procedure, tell your doctor that you are using this medicine (On-Body Injector only). What side effects may I notice from receiving this medicine? Side effects that you should report to your doctor or health care professional as soon as possible: -allergic reactions like skin rash, itching or hives, swelling of the face, lips, or tongue -dizziness -fever -pain, redness, or irritation at site   where injected -pinpoint red spots on the skin -red or dark-brown urine -shortness of breath or breathing problems -stomach or side pain, or pain at the shoulder -swelling -tiredness -trouble passing urine or change in the amount of urine Side  effects that usually do not require medical attention (report to your doctor or health care professional if they continue or are bothersome): -bone pain -muscle pain This list may not describe all possible side effects. Call your doctor for medical advice about side effects. You may report side effects to FDA at 1-800-FDA-1088. Where should I keep my medicine? Keep out of the reach of children. Store pre-filled syringes in a refrigerator between 2 and 8 degrees C (36 and 46 degrees F). Do not freeze. Keep in carton to protect from light. Throw away this medicine if it is left out of the refrigerator for more than 48 hours. Throw away any unused medicine after the expiration date. NOTE: This sheet is a summary. It may not cover all possible information. If you have questions about this medicine, talk to your doctor, pharmacist, or health care provider.  2018 [employer]vier/Gold Standard (2016-02-01 12:58:03)  

## 2017-06-11 ENCOUNTER — Other Ambulatory Visit: Payer: Self-pay | Admitting: Hematology and Oncology

## 2017-06-11 DIAGNOSIS — I10 Essential (primary) hypertension: Secondary | ICD-10-CM

## 2017-06-12 ENCOUNTER — Telehealth: Payer: Self-pay

## 2017-06-12 NOTE — Telephone Encounter (Signed)
Received call from pt to confirm her CT appt and directions on oral contrast use. Reviewed directions on Contrast allergy protocol. Prednison 50mg  13hrs, 7hrs, 1hr before CT and Benadryl 50mg  1hr before CT. Pt verbalized understanding and has no further questions at this time.

## 2017-06-17 ENCOUNTER — Other Ambulatory Visit: Payer: Self-pay | Admitting: *Deleted

## 2017-06-17 DIAGNOSIS — I10 Essential (primary) hypertension: Secondary | ICD-10-CM

## 2017-06-17 MED ORDER — TRIAMTERENE-HCTZ 37.5-25 MG PO CAPS: 1.0000 | ORAL_CAPSULE | Freq: Every day | ORAL | 1 refills | Status: DC

## 2017-06-17 MED ORDER — TRIAMTERENE-HCTZ 37.5-25 MG PO CAPS: 1.0000 | ORAL_CAPSULE | Freq: Every day | ORAL | 3 refills | Status: DC

## 2017-06-20 ENCOUNTER — Telehealth: Payer: Self-pay | Admitting: Hematology and Oncology

## 2017-06-20 ENCOUNTER — Inpatient Hospital Stay: Payer: Medicare Other | Attending: Hematology and Oncology

## 2017-06-20 ENCOUNTER — Inpatient Hospital Stay: Payer: Medicare Other

## 2017-06-20 ENCOUNTER — Inpatient Hospital Stay (HOSPITAL_BASED_OUTPATIENT_CLINIC_OR_DEPARTMENT_OTHER): Payer: Medicare Other | Admitting: Hematology and Oncology

## 2017-06-20 ENCOUNTER — Telehealth: Payer: Self-pay

## 2017-06-20 ENCOUNTER — Encounter: Payer: Self-pay | Admitting: Hematology and Oncology

## 2017-06-20 ENCOUNTER — Ambulatory Visit (HOSPITAL_COMMUNITY)
Admission: RE | Admit: 2017-06-20 | Discharge: 2017-06-20 | Disposition: A | Discharge status: Home / Self Care | Payer: Medicare Other | Source: Ambulatory Visit | Attending: Hematology and Oncology | Admitting: Hematology and Oncology

## 2017-06-20 VITALS — BP 145/90 | HR 118 | Temp 98.4°F | Resp 18 | Ht 68.0 in | Wt 186.7 lb

## 2017-06-20 DIAGNOSIS — N289 Disorder of kidney and ureter, unspecified: Secondary | ICD-10-CM | POA: Insufficient documentation

## 2017-06-20 DIAGNOSIS — R202 Paresthesia of skin: Secondary | ICD-10-CM

## 2017-06-20 DIAGNOSIS — N2889 Other specified disorders of kidney and ureter: Secondary | ICD-10-CM | POA: Diagnosis not present

## 2017-06-20 DIAGNOSIS — C8330 Diffuse large B-cell lymphoma, unspecified site: Secondary | ICD-10-CM | POA: Diagnosis not present

## 2017-06-20 DIAGNOSIS — D6481 Anemia due to antineoplastic chemotherapy: Secondary | ICD-10-CM | POA: Insufficient documentation

## 2017-06-20 DIAGNOSIS — T451X5A Adverse effect of antineoplastic and immunosuppressive drugs, initial encounter: Secondary | ICD-10-CM

## 2017-06-20 DIAGNOSIS — R200 Anesthesia of skin: Secondary | ICD-10-CM | POA: Insufficient documentation

## 2017-06-20 LAB — CBC WITH DIFFERENTIAL/PLATELET
Basophils Absolute: 0 10*3/uL (ref 0.0–0.1)
Basophils Relative: 0 %
Eosinophils Absolute: 0 10*3/uL (ref 0.0–0.5)
Eosinophils Relative: 0 %
HEMATOCRIT: 29.4 % — AB (ref 34.8–46.6)
HEMOGLOBIN: 9.6 g/dL — AB (ref 11.6–15.9)
LYMPHS PCT: 8 %
Lymphs Abs: 0.4 10*3/uL — ABNORMAL LOW (ref 0.9–3.3)
MCH: 29 pg (ref 25.1–34.0)
MCHC: 32.7 g/dL (ref 31.5–36.0)
MCV: 88.8 fL (ref 79.5–101.0)
Monocytes Absolute: 0.1 10*3/uL (ref 0.1–0.9)
Monocytes Relative: 1 %
NEUTROS ABS: 4.3 10*3/uL (ref 1.5–6.5)
Neutrophils Relative %: 91 %
Platelets: 301 10*3/uL (ref 145–400)
RBC: 3.31 MIL/uL — AB (ref 3.70–5.45)
RDW: 15.5 % — ABNORMAL HIGH (ref 11.2–14.5)
WBC: 4.8 10*3/uL (ref 3.9–10.3)
nRBC: 2 /100 WBC — ABNORMAL HIGH

## 2017-06-20 LAB — COMPREHENSIVE METABOLIC PANEL
ALK PHOS: 53 U/L (ref 40–150)
ALT: 7 U/L (ref 0–55)
ANION GAP: 10 (ref 3–11)
AST: 21 U/L (ref 5–34)
Albumin: 4.1 g/dL (ref 3.5–5.0)
BILIRUBIN TOTAL: 0.3 mg/dL (ref 0.2–1.2)
BUN: 11 mg/dL (ref 7–26)
CHLORIDE: 100 mmol/L (ref 98–109)
CO2: 28 mmol/L (ref 22–29)
Calcium: 10.2 mg/dL (ref 8.4–10.4)
Creatinine, Ser: 0.99 mg/dL (ref 0.60–1.10)
GFR calc non Af Amer: 59 mL/min — ABNORMAL LOW (ref >60)
GLUCOSE: 128 mg/dL (ref 70–140)
Potassium: 3.7 mmol/L (ref 3.5–5.1)
Sodium: 138 mmol/L (ref 136–145)
TOTAL PROTEIN: 7 g/dL (ref 6.4–8.3)

## 2017-06-20 LAB — SAMPLE TO BLOOD BANK

## 2017-06-20 MED ORDER — HEPARIN SOD (PORK) LOCK FLUSH 100 UNIT/ML IV SOLN
500.0000 [IU] | Freq: Once | INTRAVENOUS | Status: AC | PRN
  Administered 2017-06-20: 500 [IU]
  Filled 2017-06-20: qty 5

## 2017-06-20 MED ORDER — HEPARIN SOD (PORK) LOCK FLUSH 100 UNIT/ML IV SOLN
INTRAVENOUS | Status: AC
  Filled 2017-06-20: qty 5

## 2017-06-20 MED ORDER — SODIUM CHLORIDE 0.9% FLUSH
10.0000 mL | INTRAVENOUS | Status: DC | PRN
  Administered 2017-06-20: 10 mL
  Filled 2017-06-20: qty 10

## 2017-06-20 MED ORDER — HEPARIN SOD (PORK) LOCK FLUSH 100 UNIT/ML IV SOLN
500.0000 [IU] | Freq: Once | INTRAVENOUS | Status: AC
  Administered 2017-06-20: 500 [IU] via INTRAVENOUS

## 2017-06-20 MED ORDER — IOHEXOL 300 MG/ML  SOLN
100.0000 mL | Freq: Once | INTRAMUSCULAR | Status: AC | PRN
  Administered 2017-06-20: 100 mL via INTRAVENOUS

## 2017-06-20 NOTE — Patient Instructions (Signed)
Implanted Port Home Guide An implanted port is a type of central line that is placed under the skin. Central lines are used to provide IV access when treatment or nutrition needs to be given through a person's veins. Implanted ports are used for long-term IV access. An implanted port may be placed because:  You need IV medicine that would be irritating to the small veins in your hands or arms.  You need long-term IV medicines, such as antibiotics.  You need IV nutrition for a long period.  You need frequent blood draws for lab tests.  You need dialysis.  Implanted ports are usually placed in the chest area, but they can also be placed in the upper arm, the abdomen, or the leg. An implanted port has two main parts:  Reservoir. The reservoir is round and will appear as a small, raised area under your skin. The reservoir is the part where a needle is inserted to give medicines or draw blood.  Catheter. The catheter is a thin, flexible tube that extends from the reservoir. The catheter is placed into a large vein. Medicine that is inserted into the reservoir goes into the catheter and then into the vein.  How will I care for my incision site? Do not get the incision site wet. Bathe or shower as directed by your health care provider. How is my port accessed? Special steps must be taken to access the port:  Before the port is accessed, a numbing cream can be placed on the skin. This helps numb the skin over the port site.  Your health care provider uses a sterile technique to access the port. ? Your health care provider must put on a mask and sterile gloves. ? The skin over your port is cleaned carefully with an antiseptic and allowed to dry. ? The port is gently pinched between sterile gloves, and a needle is inserted into the port.  Only "non-coring" port needles should be used to access the port. Once the port is accessed, a blood return should be checked. This helps ensure that the port  is in the vein and is not clogged.  If your port needs to remain accessed for a constant infusion, a clear (transparent) bandage will be placed over the needle site. The bandage and needle will need to be changed every week, or as directed by your health care provider.  Keep the bandage covering the needle clean and dry. Do not get it wet. Follow your health care provider's instructions on how to take a shower or bath while the port is accessed.  If your port does not need to stay accessed, no bandage is needed over the port.  What is flushing? Flushing helps keep the port from getting clogged. Follow your health care provider's instructions on how and when to flush the port. Ports are usually flushed with saline solution or a medicine called heparin. The need for flushing will depend on how the port is used.  If the port is used for intermittent medicines or blood draws, the port will need to be flushed: ? After medicines have been given. ? After blood has been drawn. ? As part of routine maintenance.  If a constant infusion is running, the port may not need to be flushed.  How long will my port stay implanted? The port can stay in for as long as your health care provider thinks it is needed. When it is time for the port to come out, surgery will be   done to remove it. The procedure is similar to the one performed when the port was put in. When should I seek immediate medical care? When you have an implanted port, you should seek immediate medical care if:  You notice a bad smell coming from the incision site.  You have swelling, redness, or drainage at the incision site.  You have more swelling or pain at the port site or the surrounding area.  You have a fever that is not controlled with medicine.  This information is not intended to replace advice given to you by your health care provider. Make sure you discuss any questions you have with your health care provider. Document  Released: 02/04/2005 Document Revised: 07/13/2015 Document Reviewed: 10/12/2012 [employer] Interactive Patient Education  2017 [employer]  

## 2017-06-20 NOTE — Progress Notes (Signed)
[place] OFFICE PROGRESS NOTE  Patient Care Team: Patient, No Pcp Per as PCP - General (General Practice)  ASSESSMENT & PLAN:  Diffuse large B cell lymphoma ([place]) Formal CT report is pending I reviewed multiple imaging studies with the patient and family She has achieved complete response to treatment We discussed port removal and she agreed to proceed Plan to see her back in 3 months with history, physical examination and blood work I plan to repeat imaging study in 6 months, next due around November 2019  Anemia due to antineoplastic chemotherapy Her energy level is fair. 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 IR REMOVAL TUN ACCESS W/ PORT W/O FL MOD SED    Pt prefers Fridays, non-urgent    Standing Status:   Future    Standing Expiration Date:   08/21/2018    Order Specific Question:   Reason for exam:    Answer:   no need port. chemo is completed    Order Specific Question:   Preferred Imaging Location?    Answer:   [facility]  . Ambulatory referral to Urology    Referral Priority:   Routine    Referral Type:   Consultation    Referral Reason:   Specialty Services Required    Requested Specialty:   Urology    Number of Visits Requested:   1    INTERVAL HISTORY: Please see below for problem oriented charting. She returns with family members for further follow-up She feels well after discontinuation of chemo Her energy level is improving She had mild residual neuropathy and weakness in her legs but not  significant She is doing exercise at home without further physical therapy The patient denies any recent signs or symptoms of bleeding such as spontaneous epistaxis, hematuria or hematochezia. She denies recent infection  SUMMARY OF ONCOLOGIC HISTORY:   Diffuse large B cell lymphoma ([place])   12/12/2016 Imaging    CT scan showed  1. Extensive mesenteric, retroperitoneal, left hilar, mediastinal, left supraclavicular and left axillary adenopathy. Differential considerations include metastatic adenopathy, lymphoma and leukemia. The size of the nodes in the mesentery and retroperitoneum are suggestive of non-Hodgkin's lymphoma. 2. 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 Differential considerations include metastatic disease and lymphoma. A left upper lobe primary lung carcinoma is a possibility. 3. 3.2 cm solid right renal mass. Differential considerations include renal cell carcinoma and oncocytoma. 4. Small amount of free peritoneal fluid, most likely due to lymphatic obstruction by the mesenteric adenopathy. 5. Left fourth rib fracture. 6. Colonic diverticulosis. 7. Calcific coronary artery and aortic atherosclerosis. Aortic Atherosclerosis (ICD10-I70.0).       12/12/2016 - 12/14/2016 Hospital Admission    He was briefly admitted and evaluated for weakness.  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 Tumor fills the right neural foramina at T6-7 and T7-8 and completely replaces the T7 vertebral body.      12/18/2016 Surgery    She underwent procedure: Decompressive thoracic laminectomy from T5-T8 for resection of epidural  tumor        12/18/2016 Pathology Results    1. Soft tissue mass, simple excision, thoracic dorsal epidural mass - DIFFUSE LARGE B-CELL LYMPHOMA. - SEE ONCOLOGY TABLE. 2. Soft tissue mass, simple excision, thoracic dorsal epidural mass - DIFFUSE LARGE B-CELL LYMPHOMA. Microscopic Comment 1. LYMPHOMA Histologic type: Non-Hodgkin B-cell lymphoma: Diffuse large B-cell lymphoma. Grade (if applicable): High grade. Flow cytometry: N/A. Immunohistochemical stains: CD20. CD3, CD5, CD10, CD23, CD30, CD15, PAX-5, Ki-67, bcl-2, bcl-6, CD34. Touch preps/imprints: N/A. Comments: There is a diffuse infiltrate of atypical lymphocytes. The lymphocytes are medium to large in size with irregular nuclear contours. There are background smaller lymphocytes. While there are admixed smaller cells, there are diffuse areas of large cells. The large lymphocytes are positive for CD20, CD10, bcl-6, and bcl-2. CD3 and CD5 highlight admixed T-cells. CD34, CD30, CD23, and CD15 are negative. PAX-5 is positive. Ki-67 is variable with areas up to 70%. 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       12/18/2016 - 01/17/2017 Hospital Admission    The patient was readmitted to the hospital due to complete weakness secondary to spinal cord compression.  She underwent surgery and pathology report confirmed diagnosis of diffuse large B-cell lymphoma. She received IT chemo on 01/16/17      01/16/2017 Pathology Results    CEREBROSPINAL FLUID (SPECIMEN 1 OF 1 COLLECTED 01/16/17): NO MALIGNANT CELLS IDENTIFIED.      02/03/2017 - 02/07/2017 Hospital Admission    She is admitted for cycle 2 of chemo      02/24/2017 - 02/28/2017 Hospital Admission    She is admitted for cycle 3 of chemotherapy      03/13/2017 PET scan    1. Low level FDG uptake associated  with previous areas of soft tissue tumor within the chest and abdomen. Deauville criteria 2 and 3 compatible with treated tumor. No sites of residual metabolically active tumor (Deauville criteria 4 or 5) identified. 2. Mild diffuse uptake throughout the bone marrow is noted which is nonspecific in may reflect treatment related changes. 3. Stable size of previously noted complex lesion arising from the posterior cortex of the right kidney which now measures fluid attenuation. This is incompletely characterized on today's given lack of IV contrast material. 4. Aortic Atherosclerosis (ICD10-I70.0). Multi vessel coronary artery calcifications noted.      03/13/2017 Imaging    Study Conclusions  - Procedure narrative: Transthoracic echocardiography. Image quality was suboptimal. The study was technically difficult, as a result of poor acoustic windows, poor sound wave transmission, chest wall deformity, and body habitus. - Left ventricle: GLLS is normal at -19% The cavity size was normal. There was mild focal basal hypertrophy of the septum. Systolic function was vigorous. The estimated ejection fraction was in the range of 65% to 70%. Wall motion was normal; there were no regional wall motion abnormalities. There was an increased relative contribution of atrial contraction to ventricular filling. Doppler parameters are consistent with abnormal left ventricular relaxation (grade 1 diastolic dysfunction). - Pulmonary arteries: Systolic pressure could not be accurately estimated.  03/24/2017 - 03/28/2017 Hospital Admission    The patient was admitted to the hospital for cycle 4 of chemotherapy      04/21/2017 - 04/25/2017 Hospital Admission    She is admitted for cycle 5 of R-EPOCH      05/19/2017 - 05/23/2017 Hospital Admission    She is admitted for cycle 6 of R-EPOCH       REVIEW OF SYSTEMS:   Constitutional: Denies fevers, chills or abnormal weight loss Eyes: Denies blurriness of vision Ears,  nose, mouth, throat, and face: Denies mucositis or sore throat Respiratory: Denies cough, dyspnea or wheezes Cardiovascular: Denies palpitation, chest discomfort or lower extremity swelling Gastrointestinal:  Denies nausea, heartburn or change in bowel habits Skin: Denies abnormal skin rashes Lymphatics: Denies new lymphadenopathy or easy bruising Neurological:Denies numbness, tingling or new weaknesses Behavioral/Psych: Mood is stable, no new changes  All other systems were reviewed with the patient and are negative.  I have reviewed the past medical history, past surgical history, social history and family history with the patient and they are unchanged from previous note.  ALLERGIES:  is allergic to contrast media [iodinated diagnostic agents].  MEDICATIONS:  Current Outpatient Medications  Medication Sig Dispense Refill  . acyclovir (ZOVIRAX) 400 MG tablet Take 1 tablet (400 mg total) by mouth 2 (two) times daily. 60 tablet 9  . cyclobenzaprine (FLEXERIL) 10 MG tablet Take 1 tablet (10 mg total) by mouth 3 (three) times daily as needed for muscle spasms. 90 tablet 1  . Lactulose 20 GM/30ML SOLN Take 30 mLs (20 g total) by mouth 3 (three) times daily. 473 mL 1  . lidocaine-prilocaine (EMLA) cream Apply 1 application topically as needed. (Patient taking differently: Apply 1 application topically as needed (For port-a-cath.). ) 30 g 6  . morphine (MS CONTIN) 15 MG 12 hr tablet Take 1 tablet (15 mg total) by mouth every 12 (twelve) hours. (Patient taking differently: Take 15 mg by mouth every 12 (twelve) hours as needed for pain. ) 60 tablet 0  . ondansetron (ZOFRAN) 8 MG tablet Take 1 tablet (8 mg total) by mouth every 8 (eight) hours as needed for nausea (not responsive to prochlorperazine (COMPAZINE)). 60 tablet 1  . polyethylene glycol (MIRALAX / GLYCOLAX) packet Take 17 g by mouth daily.     . predniSONE (DELTASONE) 50 MG tablet Take 1 pill at 13 hours, 7 hours and 1 hour before CT scan  3 tablet 0  . prochlorperazine (COMPAZINE) 10 MG tablet Take 1 tablet (10 mg total) by mouth every 6 (six) hours as needed for nausea or vomiting. (Patient not taking: Reported on 05/19/2017) 60 tablet 1  . tetrahydrozoline 0.05 % ophthalmic solution Place 1-2 drops into both eyes 4 (four) times daily as needed (For eye irritation.).    Marland Kitchen traMADol (ULTRAM) 50 MG tablet TAKE 2 TABLETS BY MOUTH EVERY 6 HOURS AS NEEDED FOR SEVERE PAIN 90 tablet 0  . triamterene-hydrochlorothiazide (DYAZIDE) 37.5-25 MG capsule Take 1 each (1 capsule total) by mouth daily. 30 capsule 3   No current facility-administered medications for this visit.    Facility-Administered Medications Ordered in Other Visits  Medication Dose Route Frequency Provider Last Rate Last Dose  . heparin lock flush 100 UNIT/ML injection             PHYSICAL EXAMINATION: ECOG PERFORMANCE STATUS: 1 - Symptomatic but completely ambulatory  Vitals:   06/20/17 1227  BP: (!) 145/90  Pulse: (!) 118  Resp: 18  Temp: 98.4 F (36.9  C)  SpO2: 99%   Filed Weights   06/20/17 1227  Weight: 186 lb 11.2 oz (84.7 kg)    GENERAL:alert, no distress and comfortable SKIN: skin color looks pale, texture, turgor are normal, no rashes or significant lesions EYES: normal, Conjunctiva are pale and non-injected, sclera clear NEURO: alert & oriented x 3 with fluent speech  LABORATORY DATA:  I have reviewed the data as listed    Component Value Date/Time   NA 138 06/20/2017 0916   NA 134 (L) 02/19/2017 0925   K 3.7 06/20/2017 0916   K 3.0 (LL) 02/19/2017 0925   CL 100 06/20/2017 0916   CO2 28 06/20/2017 0916   CO2 29 02/19/2017 0925   GLUCOSE 128 06/20/2017 0916   GLUCOSE 135 02/19/2017 0925   BUN 11 06/20/2017 0916   BUN 7.4 02/19/2017 0925   CREATININE 0.99 06/20/2017 0916   CREATININE 0.9 02/19/2017 0925   CALCIUM 10.2 06/20/2017 0916   CALCIUM 8.9 02/19/2017 0925   PROT 7.0 06/20/2017 0916   PROT 6.4 02/19/2017 0925   ALBUMIN 4.1  06/20/2017 0916   ALBUMIN 3.4 (L) 02/19/2017 0925   AST 21 06/20/2017 0916   AST 16 02/19/2017 0925   ALT 7 06/20/2017 0916   ALT 13 02/19/2017 0925   ALKPHOS 53 06/20/2017 0916   ALKPHOS 86 02/19/2017 0925   BILITOT 0.3 06/20/2017 0916   BILITOT 0.65 02/19/2017 0925   GFRNONAA 59 (L) 06/20/2017 0916   GFRAA >60 06/20/2017 0916    No results found for: SPEP, UPEP  Lab Results  Component Value Date   WBC 4.8 06/20/2017   NEUTROABS 4.3 06/20/2017   HGB 9.6 (L) 06/20/2017   HCT 29.4 (L) 06/20/2017   MCV 88.8 06/20/2017   PLT 301 06/20/2017      Chemistry      Component Value Date/Time   NA 138 06/20/2017 0916   NA 134 (L) 02/19/2017 0925   K 3.7 06/20/2017 0916   K 3.0 (LL) 02/19/2017 0925   CL 100 06/20/2017 0916   CO2 28 06/20/2017 0916   CO2 29 02/19/2017 0925   BUN 11 06/20/2017 0916   BUN 7.4 02/19/2017 0925   CREATININE 0.99 06/20/2017 0916   CREATININE 0.9 02/19/2017 0925      Component Value Date/Time   CALCIUM 10.2 06/20/2017 0916   CALCIUM 8.9 02/19/2017 0925   ALKPHOS 53 06/20/2017 0916   ALKPHOS 86 02/19/2017 0925   AST 21 06/20/2017 0916   AST 16 02/19/2017 0925   ALT 7 06/20/2017 0916   ALT 13 02/19/2017 0925   BILITOT 0.3 06/20/2017 0916   BILITOT 0.65 02/19/2017 0925     All questions were answered. The patient knows to call the clinic with any problems, questions or concerns. No barriers to learning was detected.  I spent 20 minutes counseling the patient face to face. The total time spent in the appointment was 25 minutes and more than 50% was on counseling and review of test results  Heath Lark, MD 06/20/2017 1:35 PM

## 2017-06-20 NOTE — Assessment & Plan Note (Signed)
She has stable kidney lesion It is not PET avid I recommend urology consultation for follow-up and she agreed 

## 2017-06-20 NOTE — Assessment & Plan Note (Signed)
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

## 2017-06-20 NOTE — Assessment & Plan Note (Signed)
Formal CT report is pending I reviewed multiple imaging studies with the patient and family She has achieved complete response to treatment We discussed port removal and she agreed to proceed Plan to see her back in 3 months with history, physical examination and blood work I plan to repeat imaging study in 6 months, next due around November 2019

## 2017-06-20 NOTE — Telephone Encounter (Addendum)
Per Dr Alvy Bimler the following:     ----- Message from Heath Lark, MD sent at 06/20/2017  2:15 PM EDT ----- Regarding: CT pls let her know final CT report agreed she has completed remission ----- Message ----- From: Interface, Rad Results In Sent: 06/20/2017   2:00 PM To: Heath Lark, MD     I contacted pt via phone with above message and pt voiced understanding.  No other needs per pt at this time.

## 2017-06-20 NOTE — Telephone Encounter (Signed)
Gave patient AVS and calendar of upcoming august appointments °

## 2017-06-20 NOTE — Assessment & Plan Note (Signed)
Her energy level is fair. Blood count is improving Continue close observation 

## 2017-06-23 ENCOUNTER — Telehealth: Payer: Self-pay | Admitting: *Deleted

## 2017-06-23 NOTE — Telephone Encounter (Signed)
Referral called to [facility]- office notes faxed

## 2017-07-02 ENCOUNTER — Other Ambulatory Visit: Payer: Self-pay | Admitting: Radiology

## 2017-07-04 ENCOUNTER — Ambulatory Visit (HOSPITAL_COMMUNITY)
Admission: RE | Admit: 2017-07-04 | Discharge: 2017-07-04 | Disposition: A | Discharge status: Home / Self Care | Payer: Medicare Other | Source: Ambulatory Visit | Attending: Hematology and Oncology | Admitting: Hematology and Oncology

## 2017-07-04 ENCOUNTER — Encounter (HOSPITAL_COMMUNITY): Payer: Self-pay

## 2017-07-04 DIAGNOSIS — Z452 Encounter for adjustment and management of vascular access device: Secondary | ICD-10-CM | POA: Diagnosis present

## 2017-07-04 DIAGNOSIS — Z90710 Acquired absence of both cervix and uterus: Secondary | ICD-10-CM | POA: Diagnosis not present

## 2017-07-04 DIAGNOSIS — C8330 Diffuse large B-cell lymphoma, unspecified site: Secondary | ICD-10-CM

## 2017-07-04 DIAGNOSIS — I10 Essential (primary) hypertension: Secondary | ICD-10-CM | POA: Insufficient documentation

## 2017-07-04 DIAGNOSIS — M1990 Unspecified osteoarthritis, unspecified site: Secondary | ICD-10-CM | POA: Diagnosis not present

## 2017-07-04 DIAGNOSIS — R591 Generalized enlarged lymph nodes: Secondary | ICD-10-CM | POA: Insufficient documentation

## 2017-07-04 DIAGNOSIS — R918 Other nonspecific abnormal finding of lung field: Secondary | ICD-10-CM | POA: Diagnosis not present

## 2017-07-04 DIAGNOSIS — Z91041 Radiographic dye allergy status: Secondary | ICD-10-CM | POA: Insufficient documentation

## 2017-07-04 DIAGNOSIS — Z79899 Other long term (current) drug therapy: Secondary | ICD-10-CM | POA: Insufficient documentation

## 2017-07-04 DIAGNOSIS — Z9889 Other specified postprocedural states: Secondary | ICD-10-CM | POA: Insufficient documentation

## 2017-07-04 HISTORY — PX: IR REMOVAL TUN ACCESS W/ PORT W/O FL MOD SED: IMG2290

## 2017-07-04 LAB — CBC WITH DIFFERENTIAL/PLATELET
BASOS ABS: 0 10*3/uL (ref 0.0–0.1)
BASOS PCT: 0 %
EOS ABS: 0.1 10*3/uL (ref 0.0–0.7)
EOS PCT: 2 %
HEMATOCRIT: 33.2 % — AB (ref 36.0–46.0)
Hemoglobin: 10.8 g/dL — ABNORMAL LOW (ref 12.0–15.0)
Lymphocytes Relative: 16 %
Lymphs Abs: 1.2 10*3/uL (ref 0.7–4.0)
MCH: 29 pg (ref 26.0–34.0)
MCHC: 32.5 g/dL (ref 30.0–36.0)
MCV: 89 fL (ref 78.0–100.0)
MONO ABS: 0.5 10*3/uL (ref 0.1–1.0)
MONOS PCT: 7 %
Neutro Abs: 5.3 10*3/uL (ref 1.7–7.7)
Neutrophils Relative %: 75 %
PLATELETS: 282 10*3/uL (ref 150–400)
RBC: 3.73 MIL/uL — ABNORMAL LOW (ref 3.87–5.11)
RDW: 14.5 % (ref 11.5–15.5)
WBC: 7.1 10*3/uL (ref 4.0–10.5)

## 2017-07-04 LAB — PROTIME-INR
INR: 0.97
PROTHROMBIN TIME: 12.8 s (ref 11.4–15.2)

## 2017-07-04 MED ORDER — FENTANYL CITRATE (PF) 100 MCG/2ML IJ SOLN
INTRAMUSCULAR | Status: AC
  Filled 2017-07-04: qty 2

## 2017-07-04 MED ORDER — MIDAZOLAM HCL 2 MG/2ML IJ SOLN
INTRAMUSCULAR | Status: AC
  Filled 2017-07-04: qty 2

## 2017-07-04 MED ORDER — FENTANYL CITRATE (PF) 100 MCG/2ML IJ SOLN
INTRAMUSCULAR | Status: AC | PRN
  Administered 2017-07-04: 25 ug via INTRAVENOUS
  Administered 2017-07-04: 50 ug via INTRAVENOUS
  Administered 2017-07-04: 25 ug via INTRAVENOUS

## 2017-07-04 MED ORDER — SODIUM CHLORIDE 0.9 % IV SOLN
INTRAVENOUS | Status: DC
  Administered 2017-07-04: 12:00:00 via INTRAVENOUS

## 2017-07-04 MED ORDER — MIDAZOLAM HCL 2 MG/2ML IJ SOLN
INTRAMUSCULAR | Status: AC | PRN
  Administered 2017-07-04 (×2): 0.5 mg via INTRAVENOUS
  Administered 2017-07-04: 1 mg via INTRAVENOUS

## 2017-07-04 MED ORDER — CEFAZOLIN SODIUM-DEXTROSE 2-4 GM/100ML-% IV SOLN
2.0000 g | INTRAVENOUS | Status: AC
  Administered 2017-07-04: 2 g via INTRAVENOUS

## 2017-07-04 MED ORDER — CEFAZOLIN SODIUM-DEXTROSE 2-4 GM/100ML-% IV SOLN
INTRAVENOUS | Status: AC
  Administered 2017-07-04: 2 g via INTRAVENOUS
  Filled 2017-07-04: qty 100

## 2017-07-04 MED ORDER — LIDOCAINE HCL 1 % IJ SOLN
INTRAMUSCULAR | Status: AC
  Filled 2017-07-04: qty 20

## 2017-07-04 NOTE — Procedures (Signed)
Interventional Radiology Procedure Note  Procedure: Port removal  Complications: None  Estimated Blood Loss: < 10 mL  Findings: Right chest port removed in entirety. Wound closed.  Mattison Golay T. Taylor Levick, M.D Pager:  [phone]    

## 2017-07-04 NOTE — Sedation Documentation (Signed)
Patient is resting comfortably with eyes closed in NAD. MD suturing port pocket at this time 

## 2017-07-04 NOTE — H&P (Signed)
Referring Physician(s): Heath Lark  Supervising Physician: Aletta Edouard  Patient Status:  [name]  Chief Complaint:  "I'm getting my port out"  Subjective: Patient familiar to IR service from prior Port-A-Cath placement on 12/30/2016.  She has a history of diffuse large B-cell lymphoma and has completed treatment.  She presents today for Port-A-Cath removal.  She currently denies fever, headache, chest pain, dyspnea, cough, abdominal/back pain, nausea, vomiting or bleeding. Past Medical History:  Diagnosis Date  . Anemia   . Arthritis   . 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 Hypertension   . Lymphadenopathy 11/2016   "extensive"  . Paresthesia of lower extremity 11/2016   bilateral  . Pulmonary nodules 11/2016  . Right renal mass 11/2016   Past Surgical History:  Procedure Laterality Date  . ABDOMINAL HYSTERECTOMY    . BREAST LUMPECTOMY     left  . COLONOSCOPY N/A 11/02/2013   Procedure: COLONOSCOPY;  Surgeon: Danie Binder, MD;  Location: [facility];  Service: Endoscopy;  Laterality: N/A;  1:15 - moved to [place] notified pt  . IR FLUORO GUIDE PORT INSERTION RIGHT  12/30/2016  . IR US GUIDE VASC ACCESS RIGHT  12/30/2016  . LAMINECTOMY N/A 12/18/2016   Procedure: DECOMPRESSION T5-8/THORACIC;  Surgeon: Kary Kos, MD;  Location: [place];  Service: Neurosurgery;  Laterality: N/A;  Decompressive Thoracic Laminectomy, Thoracic five - six, Thoraic six - seven, Thoracic seven - eight  . SHOULDER SURGERY     right     Allergies: Contrast media [iodinated diagnostic agents]  Medications: Prior to Admission medications   Medication Sig Start Date End Date Taking? Authorizing Provider  acyclovir (ZOVIRAX) 400 MG tablet Take 1 tablet (400 mg total) by mouth 2 (two) times daily. 02/19/17  Yes Heath Lark, MD  cyclobenzaprine (FLEXERIL) 10 MG tablet Take 1 tablet (10 mg total) by mouth 3 (three) times daily as needed for muscle spasms. 05/16/17  Yes Gorsuch, Ni, MD    lidocaine-prilocaine (EMLA) cream Apply 1 application topically as needed. Patient taking differently: Apply 1 application topically as needed (For port-a-cath.).  01/31/17  Yes Gorsuch, Ni, MD  morphine (MS CONTIN) 15 MG 12 hr tablet Take 1 tablet (15 mg total) by mouth every 12 (twelve) hours. Patient taking differently: Take 15 mg by mouth every 12 (twelve) hours as needed for pain.  01/20/17  Yes Gorsuch, Ni, MD  ondansetron (ZOFRAN) 8 MG tablet Take 1 tablet (8 mg total) by mouth every 8 (eight) hours as needed for nausea (not responsive to prochlorperazine (COMPAZINE)). 01/17/17  Yes Gorsuch, Ni, MD  polyethylene glycol (MIRALAX / GLYCOLAX) packet Take 17 g by mouth daily.    Yes [provider]  predniSONE (DELTASONE) 50 MG tablet Take 1 pill at 13 hours, 7 hours and 1 hour before CT scan 05/16/17  Yes Alvy Bimler, Ni, MD  prochlorperazine (COMPAZINE) 10 MG tablet Take 1 tablet (10 mg total) by mouth every 6 (six) hours as needed for nausea or vomiting. 01/17/17  Yes Gorsuch, Ni, MD  traMADol (ULTRAM) 50 MG tablet TAKE 2 TABLETS BY MOUTH EVERY 6 HOURS AS NEEDED FOR SEVERE PAIN 05/05/17  Yes Gorsuch, Ni, MD  triamterene-hydrochlorothiazide (DYAZIDE) 37.5-25 MG capsule Take 1 each (1 capsule total) by mouth daily. 06/17/17  Yes Gorsuch, Ni, MD  Lactulose 20 GM/30ML SOLN Take 30 mLs (20 g total) by mouth 3 (three) times daily. 02/19/17   Heath Lark, MD  tetrahydrozoline 0.05 % ophthalmic solution Place 1-2 drops into both  eyes 4 (four) times daily as needed (For eye irritation.).    [provider]     Vital Signs: BP (!) 158/103   Pulse (!) 102   Temp 98.4 F (36.9 C) (Oral)   Resp 18   Ht 5\' 8"  (1.727 m)   Wt 186 lb (84.4 kg)   SpO2 99%   BMI 28.28 kg/m   Physical Exam awake, alert.  Chest clear to auscultation bilaterally.  Clean, intact right chest wall Port-A-Cath.  Heart with slightly tachycardic but regular rhythm.  Abdomen soft, positive bowel sounds, nontender.  No  lower extremity edema.  Imaging: No results found.  Labs:  CBC: Recent Labs    05/21/17 0800 05/22/17 0329 06/20/17 0916 07/04/17 1201  WBC 6.8 5.5 4.8 7.1  HGB 8.0* 8.5* 9.6* 10.8*  HCT 24.7* 25.0* 29.4* 33.2*  PLT 262 279 301 282    COAGS: Recent Labs    12/13/16 1516 12/19/16 0143 07/04/17 1201  INR 1.04 1.07 0.97  APTT 37*  --   --     BMP: Recent Labs    05/16/17 1436 05/21/17 0800 05/22/17 0329 06/20/17 0916  NA 137 140 139 138  K 3.3* 3.1* 3.9 3.7  CL 99 109 106 100  CO2 30* 24 24 28   GLUCOSE 110 93 123* 128  BUN 11 14 18 11   CALCIUM 10.0 8.2* 8.6* 10.2  CREATININE 1.02 0.71 0.81 0.99  GFRNONAA 56* >60 >60 59*  GFRAA >60 >60 >60 >60    LIVER FUNCTION TESTS: Recent Labs    03/28/17 0450 04/18/17 0927 05/16/17 1436 06/20/17 0916  BILITOT 0.4 0.4 0.3 0.3  AST 65* 28 26 21   ALT 33 13 12 7   ALKPHOS 36* 63 63 53  PROT 5.0* 6.7 6.2* 7.0  ALBUMIN 3.1* 3.9 3.6 4.1    Assessment and Plan: Pt with history of diffuse large B-cell lymphoma , status post completion of treatment.  She presents today for Port-A-Cath removal.  Details/risks of procedure, including but not limited to, internal bleeding, infection, injury to adjacent structures discussed with patient with her understanding and consent.   Electronically Signed: D. Rowe Robert, PA-C 07/04/2017, 12:59 PM   I spent a total of 20 minutes at the the patient's bedside AND on the patient's hospital floor or unit, greater than 50% of which was counseling/coordinating care for Port-A-Cath removal

## 2017-07-04 NOTE — Discharge Instructions (Signed)
Implanted Port Removal, Care After °Refer to this sheet in the next few weeks. These instructions provide you with information about caring for yourself after your procedure. Your health care provider may also give you more specific instructions. Your treatment has been planned according to current medical practices, but problems sometimes occur. Call your health care provider if you have any problems or questions after your procedure. °What can I expect after the procedure? °After the procedure, it is common to have: °· Soreness or pain near your incision. °· Some swelling or bruising near your incision. ° °Follow these instructions at home: °Medicines °· Take over-the-counter and prescription medicines only as told by your health care provider. °· If you were prescribed an antibiotic medicine, take it as told by your health care provider. Do not stop taking the antibiotic even if you start to feel better. °Bathing °· Do not take baths, swim, or use a hot tub until your health care provider approves. Ask your health care provider if you can take showers. You may only be allowed to take sponge baths for bathing. °Incision care °· Follow instructions from your health care provider about how to take care of your incision. Make sure you: °? Wash your hands with soap and water before you change your bandage (dressing). If soap and water are not available, use hand sanitizer. °? Change your dressing as told by your health care provider. °? Keep your dressing dry. °? Leave stitches (sutures), skin glue, or adhesive strips in place. These skin closures may need to stay in place for 2 weeks or longer. If adhesive strip edges start to loosen and curl up, you may trim the loose edges. Do not remove adhesive strips completely unless your health care provider tells you to do that. °· Check your incision area every day for signs of infection. Check for: °? More redness, swelling, or pain. °? More fluid or  blood. °? Warmth. °? Pus or a bad smell. °Driving °· If you received a sedative, do not drive for 24 hours after the procedure. °· If you did not receive a sedative, ask your health care provider when it is safe to drive. °Activity °· Return to your normal activities as told by your health care provider. Ask your health care provider what activities are safe for you. °· Until your health care provider says it is safe: °? Do not lift anything that is heavier than 10 lb (4.5 kg). °? Do not do activities that involve lifting your arms over your head. °General instructions °· Do not use any tobacco products, such as cigarettes, chewing tobacco, and e-cigarettes. Tobacco can delay healing. If you need help quitting, ask your health care provider. °· Keep all follow-up visits as told by your health care provider. This is important. °Contact a health care provider if: °· You have more redness, swelling, or pain around your incision. °· You have more fluid or blood coming from your incision. °· Your incision feels warm to the touch. °· You have pus or a bad smell coming from your incision. °· You have a fever. °· You have pain that is not relieved by your pain medicine. °Get help right away if: °· You have chest pain. °· You have difficulty breathing. °This information is not intended to replace advice given to you by your health care provider. Make sure you discuss any questions you have with your health care provider. °Document Released: 01/16/2015 Document Revised: 07/13/2015 Document Reviewed: 11/09/2014 °Elsevier Interactive Patient   Education © 2018 [employer] °Moderate Conscious Sedation, Adult, Care After °These instructions provide you with information about caring for yourself after your procedure. Your health care provider may also give you more specific instructions. Your treatment has been planned according to current medical practices, but problems sometimes occur. Call your health care provider if you have  any problems or questions after your procedure. °What can I expect after the procedure? °After your procedure, it is common: °· To feel sleepy for several hours. °· To feel clumsy and have poor balance for several hours. °· To have poor judgment for several hours. °· To vomit if you eat too soon. ° °Follow these instructions at home: °For at least 24 hours after the procedure: ° °· Do not: °? Participate in activities where you could fall or become injured. °? Drive. °? Use heavy machinery. °? Drink alcohol. °? Take sleeping pills or medicines that cause drowsiness. °? Make important decisions or sign legal documents. °? Take care of children on your own. °· Rest. °Eating and drinking °· Follow the diet recommended by your health care provider. °· If you vomit: °? Drink water, juice, or soup when you can drink without vomiting. °? Make sure you have little or no nausea before eating solid foods. °General instructions °· Have a responsible adult stay with you until you are awake and alert. °· Take over-the-counter and prescription medicines only as told by your health care provider. °· If you smoke, do not smoke without supervision. °· Keep all follow-up visits as told by your health care provider. This is important. °Contact a health care provider if: °· You keep feeling nauseous or you keep vomiting. °· You feel light-headed. °· You develop a rash. °· You have a fever. °Get help right away if: °· You have trouble breathing. °This information is not intended to replace advice given to you by your health care provider. Make sure you discuss any questions you have with your health care provider. °Document Released: 11/25/2012 Document Revised: 07/10/2015 Document Reviewed: 05/27/2015 °Elsevier Interactive Patient Education © 2018 [employer] ° °

## 2017-07-18 ENCOUNTER — Other Ambulatory Visit: Payer: Self-pay

## 2017-07-18 MED ORDER — TRAMADOL HCL 50 MG PO TABS: 50.0000 mg | ORAL_TABLET | Freq: Four times a day (QID) | ORAL | 0 refills | Status: DC | PRN

## 2017-08-28 ENCOUNTER — Other Ambulatory Visit: Payer: Self-pay

## 2017-08-28 ENCOUNTER — Telehealth: Payer: Self-pay

## 2017-08-28 ENCOUNTER — Other Ambulatory Visit: Payer: Self-pay | Admitting: Hematology and Oncology

## 2017-08-28 MED ORDER — TRAMADOL HCL 50 MG PO TABS: 50.0000 mg | ORAL_TABLET | Freq: Four times a day (QID) | ORAL | 0 refills | Status: DC | PRN

## 2017-08-28 MED ORDER — CYCLOBENZAPRINE HCL 10 MG PO TABS: 10.0000 mg | ORAL_TABLET | Freq: Three times a day (TID) | ORAL | 1 refills | Status: DC | PRN

## 2017-08-28 NOTE — Telephone Encounter (Signed)
Hi Nicole,  Can you call her? I typically do not prescribe these medications long term Has she re-establish with her primary doctor yet? We can send in 1 more refill but she needs to contact her PCP for long term refills of these medications

## 2017-08-28 NOTE — Telephone Encounter (Signed)
Spoke with pt by phone regarding refill request for Tramadol and Cyclobenzaprine.  Pt states she has not yet found a PCP.  This RN explained to pt that we would refill these scripts today but this would be the last time and that she needs to establish a PCP to manage these prescriptions in the future.  Pt verbalizes understanding.

## 2017-09-19 ENCOUNTER — Encounter: Payer: Self-pay | Admitting: Hematology and Oncology

## 2017-09-19 ENCOUNTER — Inpatient Hospital Stay (HOSPITAL_BASED_OUTPATIENT_CLINIC_OR_DEPARTMENT_OTHER): Payer: Medicare Other | Admitting: Hematology and Oncology

## 2017-09-19 ENCOUNTER — Telehealth: Payer: Self-pay | Admitting: *Deleted

## 2017-09-19 ENCOUNTER — Telehealth: Payer: Self-pay | Admitting: Hematology and Oncology

## 2017-09-19 ENCOUNTER — Inpatient Hospital Stay: Payer: Medicare Other | Attending: Hematology and Oncology

## 2017-09-19 VITALS — BP 134/80 | HR 88 | Temp 98.0°F | Resp 18 | Ht 68.0 in | Wt 190.5 lb

## 2017-09-19 DIAGNOSIS — Z91041 Radiographic dye allergy status: Secondary | ICD-10-CM

## 2017-09-19 DIAGNOSIS — T451X5A Adverse effect of antineoplastic and immunosuppressive drugs, initial encounter: Secondary | ICD-10-CM

## 2017-09-19 DIAGNOSIS — D6481 Anemia due to antineoplastic chemotherapy: Secondary | ICD-10-CM

## 2017-09-19 DIAGNOSIS — Z9889 Other specified postprocedural states: Secondary | ICD-10-CM | POA: Diagnosis not present

## 2017-09-19 DIAGNOSIS — C8330 Diffuse large B-cell lymphoma, unspecified site: Secondary | ICD-10-CM | POA: Insufficient documentation

## 2017-09-19 DIAGNOSIS — N289 Disorder of kidney and ureter, unspecified: Secondary | ICD-10-CM

## 2017-09-19 LAB — COMPREHENSIVE METABOLIC PANEL
ALT: 11 U/L (ref 0–44)
ANION GAP: 13 (ref 5–15)
AST: 14 U/L — ABNORMAL LOW (ref 15–41)
Albumin: 4.1 g/dL (ref 3.5–5.0)
Alkaline Phosphatase: 70 U/L (ref 38–126)
BILIRUBIN TOTAL: 0.3 mg/dL (ref 0.3–1.2)
BUN: 24 mg/dL — ABNORMAL HIGH (ref 8–23)
CO2: 25 mmol/L (ref 22–32)
Calcium: 9.8 mg/dL (ref 8.9–10.3)
Chloride: 102 mmol/L (ref 98–111)
Creatinine, Ser: 1.09 mg/dL — ABNORMAL HIGH (ref 0.44–1.00)
GFR calc Af Amer: >60 mL/min (ref >60)
GFR, EST NON AFRICAN AMERICAN: 52 mL/min — AB (ref >60)
Glucose, Bld: 100 mg/dL — ABNORMAL HIGH (ref 70–99)
POTASSIUM: 3.8 mmol/L (ref 3.5–5.1)
Sodium: 140 mmol/L (ref 135–145)
TOTAL PROTEIN: 7.1 g/dL (ref 6.5–8.1)

## 2017-09-19 LAB — CBC WITH DIFFERENTIAL/PLATELET
BASOS PCT: 1 %
Basophils Absolute: 0 10*3/uL (ref 0.0–0.1)
Eosinophils Absolute: 0.1 10*3/uL (ref 0.0–0.5)
Eosinophils Relative: 2 %
HEMATOCRIT: 33 % — AB (ref 34.8–46.6)
Hemoglobin: 11.2 g/dL — ABNORMAL LOW (ref 11.6–15.9)
LYMPHS ABS: 1.2 10*3/uL (ref 0.9–3.3)
Lymphocytes Relative: 16 %
MCH: 28.1 pg (ref 25.1–34.0)
MCHC: 33.9 g/dL (ref 31.5–36.0)
MCV: 82.8 fL (ref 79.5–101.0)
MONO ABS: 0.6 10*3/uL (ref 0.1–0.9)
MONOS PCT: 9 %
NEUTROS ABS: 5.4 10*3/uL (ref 1.5–6.5)
Neutrophils Relative %: 72 %
Platelets: 267 10*3/uL (ref 145–400)
RBC: 3.99 MIL/uL (ref 3.70–5.45)
RDW: 15.4 % — AB (ref 11.2–14.5)
WBC: 7.4 10*3/uL (ref 3.9–10.3)

## 2017-09-19 LAB — SAMPLE TO BLOOD BANK

## 2017-09-19 MED ORDER — PREDNISONE 50 MG PO TABS: ORAL_TABLET | ORAL | 0 refills | Status: DC

## 2017-09-19 NOTE — Assessment & Plan Note (Signed)
She has stable kidney lesion It is not PET avid I recommend urology consultation for follow-up and she agreed

## 2017-09-19 NOTE — Assessment & Plan Note (Signed)
She continues to have back pain and weakness We discussed cancer survivorship and enrollment in live strong program I recommend calcium and vitamin D supplement

## 2017-09-19 NOTE — Progress Notes (Signed)
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 Blood count is improving Continue close observation  Contrast media allergy I will prescribe Benadryl and prednisone before CT imaging  S/P lumbar laminectomy She continues to have back pain and weakness We discussed cancer survivorship and enrollment in live strong program I recommend calcium and vitamin D supplement  Kidney lesion, native, right She has stable kidney lesion It is not PET avid I recommend urology consultation for follow-up and she agreed   Orders Placed This Encounter  Procedures  . CT ABDOMEN PELVIS W CONTRAST    Standing Status:   Future    Standing Expiration Date:   09/20/2018    Order Specific Question:   If indicated for the ordered procedure, I authorize the administration of contrast media per Radiology protocol    Answer:   Yes    Order Specific Question:   Preferred imaging location?    Answer:   [facility]    Order Specific Question:   Radiology Contrast Protocol - do NOT remove file path    Answer:   \\[email or web address]  . CT CHEST W CONTRAST    Standing Status:   Future    Standing Expiration Date:   09/20/2018    Order Specific Question:   If indicated for the ordered procedure, I authorize the administration of contrast media per Radiology protocol    Answer:   Yes    Order Specific Question:   Preferred imaging location?    Answer:   [facility]    Order Specific Question:   Radiology Contrast Protocol - do NOT remove file path    Answer:    \\[email or web address]    INTERVAL HISTORY: Please see below for problem oriented charting. She returns with family members She feels well No new lymphadenopathy No worsening back pain Denies recent infection, fever or chills No recent falls  SUMMARY OF ONCOLOGIC HISTORY:   Diffuse large B cell lymphoma ([place])   12/12/2016 Imaging    CT scan showed  1. Extensive mesenteric, retroperitoneal, left hilar, mediastinal, left supraclavicular and left axillary adenopathy. Differential considerations include metastatic adenopathy, lymphoma and leukemia. The size of the nodes in the mesentery and retroperitoneum are suggestive of non-Hodgkin's lymphoma. 2. 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 Differential considerations include metastatic disease and lymphoma. A left upper lobe primary lung carcinoma is a possibility. 3. 3.2 cm solid right renal mass. Differential considerations include renal cell carcinoma and oncocytoma. 4. Small amount of free peritoneal fluid, most likely due to lymphatic obstruction by the mesenteric adenopathy. 5. Left fourth rib fracture. 6. Colonic diverticulosis. 7. Calcific coronary artery and aortic atherosclerosis. Aortic Atherosclerosis (ICD10-I70.0).       12/12/2016 - 12/14/2016 Hospital Admission    He was briefly admitted and evaluated for weakness.  CT imaging showed diffuse disease suspicious for lymphoma and outpatient evaluation was arranged       12/18/2016 Imaging    MR thoracic spine Very limited examination demonstrating compression of the cord from approximately mid T6 to T7-8 by  epidural tumor eccentric to the right and surrounding the cord both anteriorly and posteriorly. Tumor fills the right neural foramina at T6-7 and T7-8 and completely replaces the T7 vertebral body.      12/18/2016 Surgery    She underwent procedure: Decompressive thoracic  laminectomy from T5-T8 for resection of epidural tumor        12/18/2016 Pathology Results    1. Soft tissue mass, simple excision, thoracic dorsal epidural mass - DIFFUSE LARGE B-CELL LYMPHOMA. - SEE ONCOLOGY TABLE. 2. Soft tissue mass, simple excision, thoracic dorsal epidural mass - DIFFUSE LARGE B-CELL LYMPHOMA. Microscopic Comment 1. LYMPHOMA Histologic type: Non-Hodgkin B-cell lymphoma: Diffuse large B-cell lymphoma. Grade (if applicable): High grade. Flow cytometry: N/A. Immunohistochemical stains: CD20. CD3, CD5, CD10, CD23, CD30, CD15, PAX-5, Ki-67, bcl-2, bcl-6, CD34. Touch preps/imprints: N/A. Comments: There is a diffuse infiltrate of atypical lymphocytes. The lymphocytes are medium to large in size with irregular nuclear contours. There are background smaller lymphocytes. While there are admixed smaller cells, there are diffuse areas of large cells. The large lymphocytes are positive for CD20, CD10, bcl-6, and bcl-2. CD3 and CD5 highlight admixed T-cells. CD34, CD30, CD23, and CD15 are negative. PAX-5 is positive. Ki-67 is variable with areas up to 70%. 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       12/18/2016 - 01/17/2017 Hospital Admission    The patient was readmitted to the hospital due to complete weakness secondary to spinal cord compression.  She underwent surgery and pathology report confirmed diagnosis of diffuse large B-cell lymphoma. She received IT chemo on 01/16/17      01/16/2017 Pathology Results    CEREBROSPINAL FLUID (SPECIMEN 1 OF 1 COLLECTED 01/16/17): NO MALIGNANT CELLS IDENTIFIED.      02/03/2017 - 02/07/2017 Hospital Admission    She is admitted for cycle 2 of chemo      02/24/2017 - 02/28/2017 Hospital Admission    She is admitted for cycle 3 of chemotherapy      03/13/2017  PET scan    1. Low level FDG uptake associated with previous areas of soft tissue tumor within the chest and abdomen. Deauville criteria 2 and 3 compatible with treated tumor. No sites of residual metabolically active tumor (Deauville criteria 4 or 5) identified. 2. Mild diffuse uptake throughout the bone marrow is noted which is nonspecific in may reflect treatment related changes. 3. Stable size of previously noted complex lesion arising from the posterior cortex of the right kidney which now measures fluid attenuation. This is incompletely characterized on today's given lack of IV contrast material. 4. Aortic Atherosclerosis (ICD10-I70.0). Multi vessel coronary artery calcifications noted.      03/13/2017 Imaging    Study Conclusions  - Procedure narrative: Transthoracic echocardiography. Image quality was suboptimal. The study was technically difficult, as a result of poor acoustic windows, poor sound wave transmission, chest wall deformity, and body habitus. - Left ventricle: GLLS is normal at -19% The cavity size was normal. There was mild focal basal hypertrophy of the septum. Systolic function was vigorous. The estimated ejection fraction was in the range of 65% to 70%. Wall motion was normal; there were no regional wall motion abnormalities. There was an increased relative contribution of atrial contraction to ventricular filling. Doppler parameters are consistent with abnormal left ventricular relaxation (grade 1 diastolic dysfunction). - Pulmonary arteries: Systolic pressure could  not be accurately estimated.      03/24/2017 - 03/28/2017 Hospital Admission    The patient was admitted to the hospital for cycle 4 of chemotherapy      04/21/2017 - 04/25/2017 Hospital Admission    She is admitted for cycle 5 of R-EPOCH      05/19/2017 - 05/23/2017 Hospital Admission    She is admitted for cycle 6 of R-EPOCH      06/20/2017 Imaging    1. No findings for residual or recurrent lymphoma involving  the chest. The left fifth rib is healed and the surrounding extensive soft tissue density has completely resolved. 2. Interval regression of the vague mesenteric and retroperitoneal soft tissue densities and hazy interstitial soft tissue thickening around the mesenteric vessels. No new or enlarging lymph nodes to  suggest recurrent lymphoma. 3. 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 4. Decompressive laminectomies in the midthoracic spine. No findings for recurrent spinal tumor.      07/04/2017 Procedure    Removal of implanted Port-A-Cath utilizing sharp and blunt dissection. The procedure was uncomplicated.       REVIEW OF SYSTEMS:   Constitutional: Denies fevers, chills or abnormal weight loss Eyes: Denies blurriness of vision Ears, nose, mouth, throat, and face: Denies mucositis or sore throat Respiratory: Denies cough, dyspnea or wheezes Cardiovascular: Denies palpitation, chest discomfort or lower extremity swelling Gastrointestinal:  Denies nausea, heartburn or change in bowel habits Skin: Denies abnormal skin rashes Lymphatics: Denies new lymphadenopathy or easy bruising Neurological:Denies numbness, tingling or new weaknesses Behavioral/Psych: Mood is stable, no new changes  All other systems were reviewed with the patient and are negative.  I have reviewed the past medical history, past surgical history, social history and family history with the patient and they are unchanged from previous note.  ALLERGIES:  is allergic to contrast media [iodinated diagnostic agents].  MEDICATIONS:  Current Outpatient Medications  Medication Sig Dispense Refill  . acyclovir (ZOVIRAX) 400 MG tablet Take 1 tablet (400 mg total) by mouth 2 (two) times daily. 60 tablet 9  . cyclobenzaprine (FLEXERIL) 10 MG tablet Take 1 tablet (10 mg total) by mouth 3 (three) times daily as needed for muscle spasms. 90 tablet 1  . predniSONE (DELTASONE) 50 MG  tablet Take 1 pill at 13 hours, 7 hours and 1 hour before CT scan 3 tablet 0  . triamterene-hydrochlorothiazide (DYAZIDE) 37.5-25 MG capsule Take 1 each (1 capsule total) by mouth daily. 30 capsule 3   No current facility-administered medications for this visit.     PHYSICAL EXAMINATION: ECOG PERFORMANCE STATUS: 1 - Symptomatic but completely ambulatory  Vitals:   09/19/17 0937  BP: 134/80  Pulse: 88  Resp: 18  Temp: 98 F (36.7 C)  SpO2: 99%   Filed Weights   09/19/17 0937  Weight: 190 lb 8 oz (86.4 kg)    GENERAL:alert, no distress and comfortable SKIN: skin color, texture, turgor are normal, no rashes or significant lesions EYES: normal, Conjunctiva are pink and non-injected, sclera clear OROPHARYNX:no exudate, no erythema and lips, buccal mucosa, and tongue normal  NECK: supple, thyroid normal size, non-tender, without nodularity LYMPH:  no palpable lymphadenopathy in the cervical, axillary or inguinal LUNGS: clear to auscultation and percussion with normal breathing effort HEART: regular rate & rhythm and no murmurs and no lower extremity edema ABDOMEN:abdomen soft, non-tender and normal bowel sounds Musculoskeletal:no cyanosis of digits and no clubbing  NEURO: alert & oriented x 3 with  fluent speech, no focal motor/sensory deficits  LABORATORY DATA:  I have reviewed the data as listed    Component Value Date/Time   NA 140 09/19/2017 0902   NA 134 (L) 02/19/2017 0925   K 3.8 09/19/2017 0902   K 3.0 (LL) 02/19/2017 0925   CL 102 09/19/2017 0902   CO2 25 09/19/2017 0902   CO2 29 02/19/2017 0925   GLUCOSE 100 (H) 09/19/2017 0902   GLUCOSE 135 02/19/2017 0925   BUN 24 (H) 09/19/2017 0902   BUN 7.4 02/19/2017 0925   CREATININE 1.09 (H) 09/19/2017 0902   CREATININE 0.9 02/19/2017 0925   CALCIUM 9.8 09/19/2017 0902   CALCIUM 8.9 02/19/2017 0925   PROT 7.1 09/19/2017 0902   PROT 6.4 02/19/2017 0925   ALBUMIN 4.1 09/19/2017 0902   ALBUMIN 3.4 (L) 02/19/2017 0925    AST 14 (L) 09/19/2017 0902   AST 16 02/19/2017 0925   ALT 11 09/19/2017 0902   ALT 13 02/19/2017 0925   ALKPHOS 70 09/19/2017 0902   ALKPHOS 86 02/19/2017 0925   BILITOT 0.3 09/19/2017 0902   BILITOT 0.65 02/19/2017 0925   GFRNONAA 52 (L) 09/19/2017 0902   GFRAA >60 09/19/2017 0902    No results found for: SPEP, UPEP  Lab Results  Component Value Date   WBC 7.4 09/19/2017   NEUTROABS 5.4 09/19/2017   HGB 11.2 (L) 09/19/2017   HCT 33.0 (L) 09/19/2017   MCV 82.8 09/19/2017   PLT 267 09/19/2017      Chemistry      Component Value Date/Time   NA 140 09/19/2017 0902   NA 134 (L) 02/19/2017 0925   K 3.8 09/19/2017 0902   K 3.0 (LL) 02/19/2017 0925   CL 102 09/19/2017 0902   CO2 25 09/19/2017 0902   CO2 29 02/19/2017 0925   BUN 24 (H) 09/19/2017 0902   BUN 7.4 02/19/2017 0925   CREATININE 1.09 (H) 09/19/2017 0902   CREATININE 0.9 02/19/2017 0925      Component Value Date/Time   CALCIUM 9.8 09/19/2017 0902   CALCIUM 8.9 02/19/2017 0925   ALKPHOS 70 09/19/2017 0902   ALKPHOS 86 02/19/2017 0925   AST 14 (L) 09/19/2017 0902   AST 16 02/19/2017 0925   ALT 11 09/19/2017 0902   ALT 13 02/19/2017 0925   BILITOT 0.3 09/19/2017 0902   BILITOT 0.65 02/19/2017 0925      All questions were answered. The patient knows to call the clinic with any problems, questions or concerns. No barriers to learning was detected.  I spent 15 minutes counseling the patient face to face. The total time spent in the appointment was 20 minutes and more than 50% was on counseling and review of test results  Heath Lark, MD 09/19/2017 2:51 PM

## 2017-09-19 NOTE — Assessment & Plan Note (Signed)
She has achieved complete response to treatment Plan to see her back in 3 months with history, physical examination and blood work I plan to repeat imaging study in around November 2019

## 2017-09-19 NOTE — Telephone Encounter (Signed)
Katrina [name] states she took some medicine prior to and after her last scan, 3 pills. She is allergic to the contrast media. Scan is to be done in November.

## 2017-09-19 NOTE — Assessment & Plan Note (Signed)
I will prescribe Benadryl and prednisone before CT imaging

## 2017-09-19 NOTE — Assessment & Plan Note (Signed)
Her energy level is fair. Blood count is improving Continue close observation

## 2017-09-19 NOTE — Telephone Encounter (Signed)
I sent refill to [facility]; she can pick it up Also remind her to take Benadryl 50 mg PO 1 hour before CT

## 2017-09-19 NOTE — Telephone Encounter (Signed)
Gave patient avs and calendar of upcoming appts.  °

## 2017-09-19 NOTE — Telephone Encounter (Signed)
Notified about prescription- prednisone. Instructions reviewed. Also instructed to take Benadryl 50 mg 1 hour before CT.   Spoke with granddaughter and patient .

## 2017-12-18 ENCOUNTER — Telehealth: Payer: Self-pay | Admitting: Hematology and Oncology

## 2017-12-18 NOTE — Telephone Encounter (Signed)
NG out 10/30 thru 11/29 - moved 11/8 f/u to Dr. Audelia Hives. Spoke with patient.

## 2017-12-24 ENCOUNTER — Telehealth: Payer: Self-pay

## 2017-12-24 NOTE — Telephone Encounter (Signed)
Called and reminded patient per call from radiology on directions for allergy protocal due to contrast allergy. Instructed to take Prednisone 50 mg 13 hrs prior, 7 hours prior and 1 hour prior to CT scan tomorrow at 0930 and benadryl 50 mg 1 hour prior. She verbalized understanding.

## 2017-12-25 ENCOUNTER — Inpatient Hospital Stay: Payer: Medicare Other | Attending: Hematology and Oncology

## 2017-12-25 ENCOUNTER — Ambulatory Visit (HOSPITAL_COMMUNITY)
Admission: RE | Admit: 2017-12-25 | Discharge: 2017-12-25 | Disposition: A | Discharge status: Home / Self Care | Payer: Medicare Other | Source: Ambulatory Visit | Attending: Hematology and Oncology | Admitting: Hematology and Oncology

## 2017-12-25 DIAGNOSIS — K669 Disorder of peritoneum, unspecified: Secondary | ICD-10-CM | POA: Diagnosis not present

## 2017-12-25 DIAGNOSIS — C8330 Diffuse large B-cell lymphoma, unspecified site: Secondary | ICD-10-CM | POA: Diagnosis not present

## 2017-12-25 DIAGNOSIS — D6481 Anemia due to antineoplastic chemotherapy: Secondary | ICD-10-CM | POA: Diagnosis not present

## 2017-12-25 DIAGNOSIS — N289 Disorder of kidney and ureter, unspecified: Secondary | ICD-10-CM | POA: Insufficient documentation

## 2017-12-25 DIAGNOSIS — D49511 Neoplasm of unspecified behavior of right kidney: Secondary | ICD-10-CM | POA: Insufficient documentation

## 2017-12-25 DIAGNOSIS — Z9889 Other specified postprocedural states: Secondary | ICD-10-CM | POA: Diagnosis not present

## 2017-12-25 DIAGNOSIS — Z91041 Radiographic dye allergy status: Secondary | ICD-10-CM | POA: Diagnosis not present

## 2017-12-25 DIAGNOSIS — T451X5A Adverse effect of antineoplastic and immunosuppressive drugs, initial encounter: Secondary | ICD-10-CM | POA: Diagnosis not present

## 2017-12-25 LAB — COMPREHENSIVE METABOLIC PANEL
ALBUMIN: 4.2 g/dL (ref 3.5–5.0)
ALK PHOS: 79 U/L (ref 38–126)
ALT: 15 U/L (ref 0–44)
AST: 17 U/L (ref 15–41)
Anion gap: 13 (ref 5–15)
BILIRUBIN TOTAL: 0.3 mg/dL (ref 0.3–1.2)
BUN: 24 mg/dL — ABNORMAL HIGH (ref 8–23)
CALCIUM: 10 mg/dL (ref 8.9–10.3)
CO2: 24 mmol/L (ref 22–32)
Chloride: 105 mmol/L (ref 98–111)
Creatinine, Ser: 1.05 mg/dL — ABNORMAL HIGH (ref 0.44–1.00)
GFR calc Af Amer: >60 mL/min (ref >60)
GFR, EST NON AFRICAN AMERICAN: 55 mL/min — AB (ref >60)
GLUCOSE: 133 mg/dL — AB (ref 70–99)
Potassium: 4.7 mmol/L (ref 3.5–5.1)
Sodium: 142 mmol/L (ref 135–145)
TOTAL PROTEIN: 7.5 g/dL (ref 6.5–8.1)

## 2017-12-25 LAB — CBC WITH DIFFERENTIAL/PLATELET
Abs Immature Granulocytes: 0.13 10*3/uL — ABNORMAL HIGH (ref 0.00–0.07)
Basophils Absolute: 0 10*3/uL (ref 0.0–0.1)
Basophils Relative: 0 %
EOS ABS: 0 10*3/uL (ref 0.0–0.5)
Eosinophils Relative: 0 %
HCT: 33.1 % — ABNORMAL LOW (ref 36.0–46.0)
Hemoglobin: 11 g/dL — ABNORMAL LOW (ref 12.0–15.0)
Immature Granulocytes: 1 %
Lymphocytes Relative: 6 %
Lymphs Abs: 0.7 10*3/uL (ref 0.7–4.0)
MCH: 29.5 pg (ref 26.0–34.0)
MCHC: 33.2 g/dL (ref 30.0–36.0)
MCV: 88.7 fL (ref 80.0–100.0)
MONO ABS: 0.1 10*3/uL (ref 0.1–1.0)
MONOS PCT: 1 %
NEUTROS ABS: 11.6 10*3/uL — AB (ref 1.7–7.7)
Neutrophils Relative %: 92 %
Platelets: 291 10*3/uL (ref 150–400)
RBC: 3.73 MIL/uL — AB (ref 3.87–5.11)
RDW: 13.7 % (ref 11.5–15.5)
WBC: 12.6 10*3/uL — AB (ref 4.0–10.5)
nRBC: 0 % (ref 0.0–0.2)

## 2017-12-25 MED ORDER — SODIUM CHLORIDE (PF) 0.9 % IJ SOLN
INTRAMUSCULAR | Status: AC
  Filled 2017-12-25: qty 50

## 2017-12-25 MED ORDER — IOHEXOL 300 MG/ML  SOLN
100.0000 mL | Freq: Once | INTRAMUSCULAR | Status: AC | PRN
  Administered 2017-12-25: 100 mL via INTRAVENOUS

## 2017-12-26 ENCOUNTER — Telehealth: Payer: Self-pay | Admitting: Hematology and Oncology

## 2017-12-26 ENCOUNTER — Inpatient Hospital Stay (HOSPITAL_BASED_OUTPATIENT_CLINIC_OR_DEPARTMENT_OTHER): Payer: Medicare Other | Admitting: Hematology and Oncology

## 2017-12-26 VITALS — BP 167/94 | HR 80 | Temp 97.4°F | Resp 17 | Ht 68.0 in | Wt 221.9 lb

## 2017-12-26 DIAGNOSIS — D6481 Anemia due to antineoplastic chemotherapy: Secondary | ICD-10-CM

## 2017-12-26 DIAGNOSIS — C8330 Diffuse large B-cell lymphoma, unspecified site: Secondary | ICD-10-CM | POA: Diagnosis not present

## 2017-12-26 DIAGNOSIS — Z91041 Radiographic dye allergy status: Secondary | ICD-10-CM

## 2017-12-26 DIAGNOSIS — T451X5A Adverse effect of antineoplastic and immunosuppressive drugs, initial encounter: Secondary | ICD-10-CM

## 2017-12-26 DIAGNOSIS — N289 Disorder of kidney and ureter, unspecified: Secondary | ICD-10-CM | POA: Diagnosis not present

## 2017-12-26 DIAGNOSIS — Z9889 Other specified postprocedural states: Secondary | ICD-10-CM

## 2017-12-26 NOTE — Telephone Encounter (Signed)
Appts scheduled avs/calendar printed per 11/8 los °

## 2017-12-26 NOTE — Patient Instructions (Addendum)
We discussed in detail the results of your laboratory studies from yesterday.  Those results revealed little change from your previous studies.  Copies of your laboratory studies and CT imaging were given for your review.  We discussed also the results of your CT imaging.  There is no convincing evidence of recurrent lymphoma.  The mass lesion in the right kidney is slightly larger over the past 6 months.  It is recommended that you obtain a urology consultation for their opinion.  Barring any unforeseen complications, your next scheduled visit with laboratory studies is with Dr. Alvy Bimler is on December 20.  Please do not hesitate to call should any new or untoward problems arise in the interim.

## 2017-12-27 ENCOUNTER — Encounter: Payer: Self-pay | Admitting: Hematology and Oncology

## 2017-12-27 NOTE — Progress Notes (Signed)
Hematology/Oncology OutpatientProgress Note  Patient Name:  Katrina [name] DOB:  01/02/1951  Date of Service: December 26, 2017  Primary Care Physician: Leslie Andrea, MD [address] [place], [place] [address]  Consulting Physician: Henreitta Leber, MD Hematology/Oncology (Coverage for Dr. Alvy Bimler)  ASSESSMENT & PLAN:  Diffuse large B cell lymphoma ([place]) She has achieved complete response to treatment She denies any new problems or complaints. She has no fever, shaking chills, sweats, or itching. CT imaging of the chest, abdomen, and pelvis from November 7 suggests no evidence of recurrent lymphoma. Increasing size of the right kidney mass since May, 2019 Those results are detailed below.  Anemia due to antineoplastic chemotherapy Her energy level is improved CBC: Hgb 11.0 HCT 33.1 WBC 12.6 PLT 291 Will repeat in 6 weeks. A follow-up appointment with laboratory studies on February 06, 2018 with Dr. Alvy Bimler. Continue close observation  Contrast media allergy Benadryl and prednisone before CT imaging  S/P lumbar laminectomy She continues to have back pain and weakness Previously discussed cancer survivorship and enrollment in live strong program Both calcium and vitamin D was recommended  Kidney lesion, native, right Progressive size of the right kidney mass (see below) Dr. Alvy Bimler previously recommend urology consultation which was never executed. She has an appointment soon to see Dr.Stephen Karie Kirks in [place], her new primary care physician. I strongly recommended urologic surgical consultation.   This is likely a renal neoplasm. Instead, she wants to discuss urologic surgical consultation with Dr. Karie Kirks [facility]). She was given copies of her laboratory studies and recent imaging.  INTERVAL HISTORY: Please see below for problem oriented charting. She returns with family friend She feels well No new lymphadenopathy No worsening  back pain Denies recent infection, fever or chills No recent falls  SUMMARY OF ONCOLOGIC HISTORY:   Diffuse large B cell lymphoma ([place])   12/12/2016 Imaging    CT scan showed  1. Extensive mesenteric, retroperitoneal, left hilar, mediastinal, left supraclavicular and left axillary adenopathy. Differential considerations include metastatic adenopathy, lymphoma and leukemia. The size of the nodes in the mesentery and retroperitoneum are suggestive of non-Hodgkin's lymphoma. 2. 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 Differential considerations include metastatic disease and lymphoma. A left upper lobe primary lung carcinoma is a possibility. 3. 3.2 cm solid right renal mass. Differential considerations include renal cell carcinoma and oncocytoma. 4. Small amount of free peritoneal fluid, most likely due to lymphatic obstruction by the mesenteric adenopathy. 5. Left fourth rib fracture. 6. Colonic diverticulosis. 7. Calcific coronary artery and aortic atherosclerosis. Aortic Atherosclerosis (ICD10-I70.0).     12/12/2016 - 12/14/2016 Hospital Admission    He was briefly admitted and evaluated for weakness.  CT imaging showed diffuse disease suspicious for lymphoma and outpatient evaluation was arranged     12/18/2016 Imaging    MR thoracic spine Very limited examination demonstrating compression of the cord from approximately mid T6 to T7-8 by epidural tumor eccentric to the right and surrounding the cord both anteriorly and posteriorly. Tumor fills the right neural foramina at T6-7 and T7-8 and completely replaces the T7 vertebral body.    12/18/2016 Surgery    She underwent procedure: Decompressive thoracic laminectomy from T5-T8 for resection of epidural tumor      12/18/2016 Pathology Results    1. Soft tissue mass, simple excision, thoracic dorsal epidural mass - DIFFUSE LARGE B-CELL LYMPHOMA. - SEE ONCOLOGY  TABLE. 2. Soft tissue mass, simple excision, thoracic  dorsal epidural mass - DIFFUSE LARGE B-CELL LYMPHOMA. Microscopic Comment 1. LYMPHOMA Histologic type: Non-Hodgkin B-cell lymphoma: Diffuse large B-cell lymphoma. Grade (if applicable): High grade. Flow cytometry: N/A. Immunohistochemical stains: CD20. CD3, CD5, CD10, CD23, CD30, CD15, PAX-5, Ki-67, bcl-2, bcl-6, CD34. Touch preps/imprints: N/A. Comments: There is a diffuse infiltrate of atypical lymphocytes. The lymphocytes are medium to large in size with irregular nuclear contours. There are background smaller lymphocytes. While there are admixed smaller cells, there are diffuse areas of large cells. The large lymphocytes are positive for CD20, CD10, bcl-6, and bcl-2. CD3 and CD5 highlight admixed T-cells. CD34, CD30, CD23, and CD15 are negative. PAX-5 is positive. Ki-67 is variable with areas up to 70%. 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     12/18/2016 - 01/17/2017 Hospital Admission    The patient was readmitted to the hospital due to complete weakness secondary to spinal cord compression.  She underwent surgery and pathology report confirmed diagnosis of diffuse large B-cell lymphoma. She received IT chemo on 01/16/17    01/16/2017 Pathology Results    CEREBROSPINAL FLUID (SPECIMEN 1 OF 1 COLLECTED 01/16/17): NO MALIGNANT CELLS IDENTIFIED.    02/03/2017 - 02/07/2017 Hospital Admission    She is admitted for cycle 2 of chemo    02/24/2017 - 02/28/2017 Hospital Admission    She is admitted for cycle 3 of chemotherapy    03/13/2017 PET scan    1. Low level FDG uptake associated with previous areas of soft tissue tumor within the chest and abdomen. Deauville criteria 2 and 3 compatible with treated tumor. No sites of residual metabolically active tumor  (Deauville criteria 4 or 5) identified. 2. Mild diffuse uptake throughout the bone marrow is noted which is nonspecific in may reflect treatment related changes. 3. Stable size of previously noted complex lesion arising from the posterior cortex of the right kidney which now measures fluid attenuation. This is incompletely characterized on today's given lack of IV contrast material. 4. Aortic Atherosclerosis (ICD10-I70.0). Multi vessel coronary artery calcifications noted.    03/13/2017 Imaging    Study Conclusions  - Procedure narrative: Transthoracic echocardiography. Image quality was suboptimal. The study was technically difficult, as a result of poor acoustic windows, poor sound wave transmission, chest wall deformity, and body habitus. - Left ventricle: GLLS is normal at -19% The cavity size was normal. There was mild focal basal hypertrophy of the septum. Systolic function was vigorous. The estimated ejection fraction was in the range of 65% to 70%. Wall motion was normal; there were no regional wall motion abnormalities. There was an increased relative contribution of atrial contraction to ventricular filling. Doppler parameters are consistent with abnormal left ventricular relaxation (grade 1 diastolic dysfunction). - Pulmonary arteries: Systolic pressure could not be accurately estimated.    03/24/2017 - 03/28/2017 Hospital Admission    The patient was admitted to the hospital for cycle 4 of chemotherapy    04/21/2017 - 04/25/2017 Hospital Admission    She is admitted for cycle 5 of R-EPOCH    05/19/2017 - 05/23/2017 Hospital Admission    She is admitted for cycle 6 of R-EPOCH    06/20/2017 Imaging    1. No findings for residual or recurrent lymphoma involving the chest. The left fifth rib is healed and the surrounding extensive soft tissue density has completely resolved. 2. Interval regression of the vague  mesenteric and retroperitoneal soft tissue densities and hazy interstitial soft tissue  thickening around the mesenteric vessels. No new or enlarging lymph nodes to  suggest recurrent lymphoma. 3. 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 4. Decompressive laminectomies in the midthoracic spine. No findings for recurrent spinal tumor.    07/04/2017 Procedure    Removal of implanted Port-A-Cath utilizing sharp and blunt dissection. The procedure was uncomplicated.      12/25/2017                 CT chest, abdomen, and pelvis: No lymphoma                                     Increasing size of right kidney mass (below)  REVIEW OF SYSTEMS:   Constitutional: Denies fevers, chills or abnormal weight loss Eyes: Denies blurriness of vision Ears, nose, mouth, throat, and face: Denies mucositis or sore throat Respiratory: Denies cough, dyspnea or wheezes Cardiovascular: Denies palpitation, chest discomfort or lower extremity swelling Gastrointestinal:  Denies nausea, heartburn or change in bowel habits Skin: Denies abnormal skin rashes Lymphatics: Denies new lymphadenopathy or easy bruising Neurological:Denies numbness, tingling or new weaknesses Behavioral/Psych: Mood is stable, no new changes  All other systems were reviewed with the patient and are negative.  I have reviewed the past medical history, past surgical history, social history and family history with the patient and they are unchanged from previous note.  ALLERGIES:  is allergic to contrast media [iodinated diagnostic agents].  MEDICATIONS:  Current Outpatient Medications  Medication Sig Dispense Refill  . acyclovir (ZOVIRAX) 400 MG tablet Take 1 tablet (400 mg total) by mouth 2 (two) times daily. 60 tablet 9  . cyclobenzaprine (FLEXERIL) 10 MG tablet Take 1 tablet (10 mg total) by mouth 3 (three) times daily as needed for muscle spasms. 90 tablet 1  . predniSONE (DELTASONE) 50 MG tablet Take 1 pill at 13 hours, 7 hours and 1 hour before CT scan 3 tablet 0  .  triamterene-hydrochlorothiazide (DYAZIDE) 37.5-25 MG capsule Take 1 each (1 capsule total) by mouth daily. 30 capsule 3   No current facility-administered medications for this visit.     PHYSICAL EXAMINATION: ECOG PERFORMANCE STATUS: 1 - Symptomatic but completely ambulatory Vitals:   12/26/17 1508  BP: (!) 167/94  Pulse: 80  Resp: 17  Temp: (!) 97.4 F (36.3 C)  SpO2: 99%   Filed Weights   12/26/17 1508  Weight: 221 lb 14.4 oz (100.7 kg)  GENERAL:alert, no distress and comfortable SKIN: skin color, texture, turgor are normal, no rashes or significant lesions EYES: normal, Conjunctiva are pink and non-injected, sclera clear OROPHARYNX:no exudate, no erythema and lips, buccal mucosa, and tongue normal  NECK: supple, thyroid normal size, non-tender, without nodularity LYMPH:  no palpable lymphadenopathy in the cervical, axillary or inguinal LUNGS: clear to auscultation and percussion with normal breathing effort HEART: regular rate & rhythm and no murmurs and no lower extremity edema ABDOMEN:abdomen soft, non-tender and normal bowel sounds Musculoskeletal:no cyanosis of digits and no clubbing  NEURO: alert & oriented x 3 with fluent speech, no focal motor/sensory deficits  LABORATORY DATA:  I have reviewed the data as listed  Ref Range & Units 2d ago  WBC 4.0 - 10.5 K/uL 12.6High    RBC 3.87 - 5.11 MIL/uL 3.73Low    Hemoglobin 12.0 - 15.0 g/dL 11.0Low  HCT 36.0 - 46.0 % 33.1Low    MCV 80.0 - 100.0 fL 88.7   MCH 26.0 - 34.0 pg 29.5   MCHC 30.0 - 36.0 g/dL 33.2   RDW 11.5 - 15.5 % 13.7   Platelets 150 - 400 K/uL 291   nRBC 0.0 - 0.2 % 0.0   Neutrophils Relative % % 92   Neutro Abs 1.7 - 7.7 K/uL 11.6High    Lymphocytes Relative % 6   Lymphs Abs 0.7 - 4.0 K/uL 0.7   Monocytes Relative % 1   Monocytes Absolute 0.1 - 1.0 K/uL 0.1   Eosinophils Relative % 0   Eosinophils Absolute 0.0 - 0.5 K/uL 0.0   Basophils Relative % 0   Basophils Absolute 0.0 - 0.1 K/uL 0.0     Immature Granulocytes % 1   Abs Immature Granulocytes 0.00 - 0.07 K/uL 0.13High      Ref Range & Units 2d ago  Sodium 135 - 145 mmol/L 142   Potassium 3.5 - 5.1 mmol/L 4.7   Chloride 98 - 111 mmol/L 105   CO2 22 - 32 mmol/L 24   Glucose, Bld 70 - 99 mg/dL 133High    BUN 8 - 23 mg/dL 24High    Creatinine, Ser 0.44 - 1.00 mg/dL 1.05High    Calcium 8.9 - 10.3 mg/dL 10.0   Total Protein 6.5 - 8.1 g/dL 7.5   Albumin 3.5 - 5.0 g/dL 4.2   AST 15 - 41 U/L 17   ALT 0 - 44 U/L 15   Alkaline Phosphatase 38 - 126 U/L 79   Total Bilirubin 0.3 - 1.2 mg/dL 0.3   GFR calc non Af Amer >60 mL/min 55Low    GFR calc Af Amer >60 mL/min >60   Comment: (NOTE)       Component Value Date/Time   NA 142 12/25/2017 0844   NA 134 (L) 02/19/2017 0925   K 4.7 12/25/2017 0844   K 3.0 (LL) 02/19/2017 0925   CL 105 12/25/2017 0844   CO2 24 12/25/2017 0844   CO2 29 02/19/2017 0925   GLUCOSE 133 (H) 12/25/2017 0844   GLUCOSE 135 02/19/2017 0925   BUN 24 (H) 12/25/2017 0844   BUN 7.4 02/19/2017 0925   CREATININE 1.05 (H) 12/25/2017 0844   CREATININE 0.9 02/19/2017 0925   CALCIUM 10.0 12/25/2017 0844   CALCIUM 8.9 02/19/2017 0925   PROT 7.5 12/25/2017 0844   PROT 6.4 02/19/2017 0925   ALBUMIN 4.2 12/25/2017 0844   ALBUMIN 3.4 (L) 02/19/2017 0925   AST 17 12/25/2017 0844   AST 16 02/19/2017 0925   ALT 15 12/25/2017 0844   ALT 13 02/19/2017 0925   ALKPHOS 79 12/25/2017 0844   ALKPHOS 86 02/19/2017 0925   BILITOT 0.3 12/25/2017 0844   BILITOT 0.65 02/19/2017 0925   GFRNONAA 55 (L) 12/25/2017 0844   GFRAA >60 12/25/2017 0844   Lab Results  Component Value Date   WBC 12.6 (H) 12/25/2017   NEUTROABS 11.6 (H) 12/25/2017   HGB 11.0 (L) 12/25/2017   HCT 33.1 (L) 12/25/2017   MCV 88.7 12/25/2017   PLT 291 12/25/2017      Chemistry      Component Value Date/Time   NA 142 12/25/2017 0844   NA 134 (L) 02/19/2017 0925   K 4.7 12/25/2017 0844   K 3.0 (LL) 02/19/2017 0925   CL 105 12/25/2017 0844    CO2 24 12/25/2017 0844   CO2 29 02/19/2017 0925   BUN 24 (H)  12/25/2017 0844   BUN 7.4 02/19/2017 0925   CREATININE 1.05 (H) 12/25/2017 0844   CREATININE 0.9 02/19/2017 0925      Component Value Date/Time   CALCIUM 10.0 12/25/2017 0844   CALCIUM 8.9 02/19/2017 0925   ALKPHOS 79 12/25/2017 0844   ALKPHOS 86 02/19/2017 0925   AST 17 12/25/2017 0844   AST 16 02/19/2017 0925   ALT 15 12/25/2017 0844   ALT 13 02/19/2017 0925   BILITOT 0.3 12/25/2017 0844   BILITOT 0.65 02/19/2017 0925    Diagnostic imaging: December 25, 2017 CT CHEST, ABDOMEN, AND PELVIS WITH CONTRAST  TECHNIQUE: Multidetector CT imaging of the chest, abdomen and pelvis was performed following the standard protocol during bolus administration of intravenous contrast.  CONTRAST:  177m OMNIPAQUE IOHEXOL 300 MG/ML  SOLN  COMPARISON:  CT 06/20/2017, PET-CT 03/13/2017  FINDINGS: CT CHEST FINDINGS  Cardiovascular: Coronary artery calcification and aortic atherosclerotic calcification.  Mediastinum/Nodes: No axillary supraclavicular adenopathy. No mediastinal hilar adenopathy no pericardial fluid. Esophagus normal.  Lungs/Pleura: No suspicious pulmonary nodules.  Musculoskeletal: No aggressive osseous lesion.  CT ABDOMEN AND PELVIS FINDINGS  Hepatobiliary: No focal hepatic lesion. No biliary ductal dilatation. Gallbladder is normal. Common bile duct is normal.  Pancreas: Pancreas is normal. No ductal dilatation. No pancreatic inflammation.  Spleen: Normal spleen  Adrenals/urinary tract: Adrenal glands normal.  Large round enhancing mass extending from the posterior cortex of the RIGHT kidney measures 3.5 x 2.9 by 3.8 cm (volume = 20 cm^3) compared to 3.1 x 3.2 x 3.3 cm (volume = 17 cm^3). Ureters and bladder normal.  Stomach/Bowel: Stomach, small-bowel cecum are normal. Within the small bowel mesentery, there is haziness surrounding the mesenteric vessels not changed from  comparison exam. Haziness extends from the root of the mesentery to the RIGHT lower quadrant the ileocecal mesentery. No measurable mass lesion within this thickening.  Vascular/Lymphatic: Abdominal aorta normal caliber. No retroperitoneal periportal lymphadenopathy. Hazy tissue LEFT of the aorta measuring 8 mm (image 71/2) is unchanged. This may represent treated lymphoma  Reproductive: Post hysterectomy  Other: No free fluid.  Musculoskeletal: No aggressive osseous lesion.  IMPRESSION: Chest Impression:  1. No evidence of lymphoma recurrence in thorax.  Abdomen / Pelvis Impression:  1. No evidence of lymphoma recurrence in the abdomen pelvis. 2. Stable hazy peritoneal fat surrounding the mesenteric vessels. 3. Stable ill-defined nodular density in the LEFT retroperitoneum adjacent to the aorta. 4. Interval increase in size of RIGHT renal neoplasm  SSuzy BouchardM.D. 12/25/2017 16:41  The total time spent discussing the her most recent laboratory studies, results of her CT imaging, physical examination, role and rationale for urologic surgical consultation, mild anemia, and recommendations was 25 minutes.  At least 50% of that time was spent in face-to-face discussion, counseling, and answering questions. There was ample time allotted to answer all questions.  This note was dictated using voice activated technology/software.  Unfortunately, typographical errors are not uncommon, and transcription is subject to mistakes and regrettably misinterpretation.  If necessary, clarification of the above information can be discussed with me at any time.   FOLLOW UP: AS DIRECTED   cc:       NHeath LarkMD             SLeslie Andrea MD   RHenreitta Leber MD  Hematology/Oncology [facility] [place] [address]Office: [phone]: [phone]

## 2017-12-30 ENCOUNTER — Telehealth: Payer: Self-pay | Admitting: *Deleted

## 2017-12-30 NOTE — Telephone Encounter (Signed)
Faxed office notes to Dr. Leslie Andrea as per Dr. Payton Spark instructions. Phone      [phone]         Fax        [phone]

## 2018-01-16 ENCOUNTER — Other Ambulatory Visit: Payer: Self-pay | Admitting: Hematology and Oncology

## 2018-01-16 DIAGNOSIS — C8330 Diffuse large B-cell lymphoma, unspecified site: Secondary | ICD-10-CM

## 2018-01-19 ENCOUNTER — Other Ambulatory Visit (HOSPITAL_COMMUNITY): Payer: Self-pay | Admitting: Nurse Practitioner

## 2018-01-19 DIAGNOSIS — Z780 Asymptomatic menopausal state: Secondary | ICD-10-CM

## 2018-01-26 ENCOUNTER — Ambulatory Visit (INDEPENDENT_AMBULATORY_CARE_PROVIDER_SITE_OTHER): Payer: Self-pay

## 2018-01-26 DIAGNOSIS — Z86010 Personal history of colonic polyps: Secondary | ICD-10-CM

## 2018-01-26 MED ORDER — NA SULFATE-K SULFATE-MG SULF 17.5-3.13-1.6 GM/177ML PO SOLN: 1.0000 | ORAL | 0 refills | Status: DC

## 2018-01-26 NOTE — Patient Instructions (Signed)
Katrina [name]  12-08-1950 MRN: [id number]     Procedure Date: 04/17/18 Time to register: 11:00am Place to register: [facility] Procedure Time: 12:00pm Scheduled provider: Barney Drain, MD    PREPARATION FOR COLONOSCOPY WITH SUPREP BOWEL PREP KIT  Note: Suprep Bowel Prep Kit is a split-dose (2day) regimen. Consumption of BOTH 6-ounce bottles is required for a complete prep.  Please notify us immediately if you are diabetic, take iron supplements, or if you are on Coumadin or any other blood thinners.                                                                                                                              1 DAY BEFORE PROCEDURE:  DATE: 04/16/18   DAY: Thursday  clear liquids the entire day - NO SOLID FOOD.   At 6:00pm: Complete steps 1 through 4 below, using ONE (1) 6-ounce bottle, before going to bed. Step 1:  Pour ONE (1) 6-ounce bottle of SUPREP liquid into the mixing container.  Step 2:  Add cool drinking water to the 16 ounce line on the container and mix.  Note: Dilute the solution concentrate as directed prior to use. Step 3:  DRINK ALL the liquid in the container. Step 4:  You MUST drink an additional two (2) or more 16 ounce containers of water over the next one (1) hour.   Continue clear liquids.  DAY OF PROCEDURE:   DATE: 04/17/18   DAY: Friday If you take medications for your heart, blood pressure, or breathing, you may take these medications.    5 hours before your procedure at :7:00am Step 1:  Pour ONE (1) 6-ounce bottle of SUPREP liquid into the mixing container.  Step 2:  Add cool drinking water to the 16 ounce line on the container and mix.  Note: Dilute the solution concentrate as directed prior to use. Step 3:  DRINK ALL the liquid in the container. Step 4:  You MUST drink an additional two (2) or more 16 ounce containers of water over the next one (1) hour. You MUST complete the final glass of water at least 3 hours before your  colonoscopy.   Nothing by mouth past 9:00am  You may take your morning medications with sip of water unless we have instructed otherwise.    Please see below for Dietary Information.  CLEAR LIQUIDS INCLUDE:  Water Jello (NOT red in color)   Ice Popsicles (NOT red in color)   Tea (sugar ok, no milk/cream) Powdered fruit flavored drinks  Coffee (sugar ok, no milk/cream) Gatorade/ Lemonade/ Kool-Aid  (NOT red in color)   Juice: apple, white grape, white cranberry Soft drinks  Clear bullion, consomme, broth (fat free beef/chicken/vegetable)  Carbonated beverages (any kind)  Strained chicken noodle soup Hard Candy   Remember: Clear liquids are liquids that will allow you to see your fingers on the other side of a clear glass. Be sure liquids are  NOT red in color, and not cloudy, but CLEAR.  DO NOT EAT OR DRINK ANY OF THE FOLLOWING:  Dairy products of any kind   Cranberry juice Tomato juice / V8 juice   Grapefruit juice Orange juice     Red grape juice  Do not eat any solid foods, including such foods as: cereal, oatmeal, yogurt, fruits, vegetables, creamed soups, eggs, bread, crackers, pureed foods in a blender, etc.   HELPFUL HINTS FOR DRINKING PREP SOLUTION:   Make sure prep is extremely cold. Mix and refrigerate the the morning of the prep. You may also put in the freezer.   You may try mixing some Crystal Light or Country Time Lemonade if you prefer. Mix in small amounts; add more if necessary.  Try drinking through a straw  Rinse mouth with water or a mouthwash between glasses, to remove after-taste.  Try sipping on a cold beverage /ice/ popsicles between glasses of prep.  Place a piece of sugar-free hard candy in mouth between glasses.  If you become nauseated, try consuming smaller amounts, or stretch out the time between glasses. Stop for 30-60 minutes, then slowly start back drinking.     OTHER INSTRUCTIONS  You will need a responsible adult at least 66 years of  age to accompany you and drive you home. This person must remain in the waiting room during your procedure. The hospital will cancel your procedure if you do not have a responsible adult with you.   1. Wear loose fitting clothing that is easily removed. 2. Leave jewelry and other valuables at home.  3. Remove all body piercing jewelry and leave at home. 4. Total time from sign-in until discharge is approximately 2-3 hours. 5. You should go home directly after your procedure and rest. You can resume normal activities the day after your procedure. 6. The day of your procedure you should not:  Drive  Make legal decisions  Operate machinery  Drink alcohol  Return to work   You may call the office (Dept: [phone]) before 5:00pm, or page the doctor on call [phone]) after 5:00pm, for further instructions, if necessary.   Insurance Information YOU WILL NEED TO CHECK WITH YOUR INSURANCE COMPANY FOR THE BENEFITS OF COVERAGE YOU HAVE FOR THIS PROCEDURE.  UNFORTUNATELY, NOT ALL INSURANCE COMPANIES HAVE BENEFITS TO COVER ALL OR PART OF THESE TYPES OF PROCEDURES.  IT IS YOUR RESPONSIBILITY TO CHECK YOUR BENEFITS, HOWEVER, WE WILL BE GLAD TO ASSIST YOU WITH ANY CODES YOUR INSURANCE COMPANY MAY NEED.    PLEASE NOTE THAT MOST INSURANCE COMPANIES WILL NOT COVER A SCREENING COLONOSCOPY FOR PEOPLE UNDER THE AGE OF 50  IF YOU HAVE BCBS INSURANCE, YOU MAY HAVE BENEFITS FOR A SCREENING COLONOSCOPY BUT IF POLYPS ARE FOUND THE DIAGNOSIS WILL CHANGE AND THEN YOU MAY HAVE A DEDUCTIBLE THAT WILL NEED TO BE MET. SO PLEASE MAKE SURE YOU CHECK YOUR BENEFITS FOR A SCREENING COLONOSCOPY AS WELL AS A DIAGNOSTIC COLONOSCOPY.

## 2018-01-26 NOTE — Progress Notes (Signed)
Gastroenterology Pre-Procedure Review  Request Date:01/26/18 Requesting Physician: Dr.Knowlton- last tcs 11/02/13 [facility]- #5 tubular adenomas was due in 2018- letter was mailed to the pt to schedule with no response.   PATIENT REVIEW QUESTIONS: The patient responded to the following health history questions as indicated:    1. Diabetes Melitis: no 2. Joint replacements in the past 12 months: no 3. Major health problems in the past 3 months: no 4. Has an artificial valve or MVP: no 5. Has a defibrillator: no 6. Has been advised in past to take antibiotics in advance of a procedure like teeth cleaning: no 7. Family history of colon cancer: yes (brother and sister )  37. Alcohol Use: no 9. History of sleep apnea: no  10. History of coronary artery or other vascular stents placed within the last 12 months: no 11. History of any prior anesthesia complications: no    MEDICATIONS & ALLERGIES:    Patient reports the following regarding taking any blood thinners:   Plavix? no Aspirin? no Coumadin? no Brilinta? no Xarelto? no Eliquis? no Pradaxa? no Savaysa? no Effient? no  Patient confirms/reports the following medications:  Current Outpatient Medications  Medication Sig Dispense Refill  . acyclovir (ZOVIRAX) 400 MG tablet Take 1 tablet (400 mg total) by mouth 2 (two) times daily. 60 tablet 9  . cholecalciferol (VITAMIN D3) 25 MCG (1000 UT) tablet Take 2,000 Units by mouth daily.    . Cyanocobalamin (VITAMIN B 12 PO) Take 2,000 mg by mouth.    . cyclobenzaprine (FLEXERIL) 10 MG tablet Take 1 tablet (10 mg total) by mouth 3 (three) times daily as needed for muscle spasms. 90 tablet 1  . Fexofenadine HCl (ALLERGY 24-HR PO) Take by mouth as needed.    . triamterene-hydrochlorothiazide (DYAZIDE) 37.5-25 MG capsule Take 1 each (1 capsule total) by mouth daily. 30 capsule 3   No current facility-administered medications for this visit.     Patient confirms/reports the following allergies:   Allergies  Allergen Reactions  . Contrast Media [Iodinated Diagnostic Agents] Hives and Itching    No orders of the defined types were placed in this encounter.   AUTHORIZATION INFORMATION Primary Insurance: [employer] medicare,  [place] #: [id number] Pre-Cert / Josem Kaufmann required: no  SCHEDULE INFORMATION: Procedure has been scheduled as follows:  Date: 04/17/18, Time: 12:00 Location: [facility] Dr[facility]Fields  This Gastroenterology Pre-Precedure Review Form is being routed to the following provider(s): Roseanne Kaufman NP

## 2018-01-27 NOTE — Progress Notes (Signed)
Approved.  

## 2018-01-28 ENCOUNTER — Other Ambulatory Visit (HOSPITAL_COMMUNITY): Payer: Self-pay | Admitting: Family Medicine

## 2018-01-28 DIAGNOSIS — Z1231 Encounter for screening mammogram for malignant neoplasm of breast: Secondary | ICD-10-CM

## 2018-02-05 ENCOUNTER — Inpatient Hospital Stay: Payer: Medicare Other | Attending: Hematology and Oncology

## 2018-02-05 ENCOUNTER — Encounter: Payer: Self-pay | Admitting: Hematology and Oncology

## 2018-02-05 ENCOUNTER — Inpatient Hospital Stay (HOSPITAL_BASED_OUTPATIENT_CLINIC_OR_DEPARTMENT_OTHER): Payer: Medicare Other | Admitting: Hematology and Oncology

## 2018-02-05 ENCOUNTER — Telehealth: Payer: Self-pay | Admitting: Hematology and Oncology

## 2018-02-05 DIAGNOSIS — Z9221 Personal history of antineoplastic chemotherapy: Secondary | ICD-10-CM | POA: Diagnosis not present

## 2018-02-05 DIAGNOSIS — Z79899 Other long term (current) drug therapy: Secondary | ICD-10-CM

## 2018-02-05 DIAGNOSIS — C8330 Diffuse large B-cell lymphoma, unspecified site: Secondary | ICD-10-CM

## 2018-02-05 DIAGNOSIS — N289 Disorder of kidney and ureter, unspecified: Secondary | ICD-10-CM | POA: Insufficient documentation

## 2018-02-05 DIAGNOSIS — T451X5A Adverse effect of antineoplastic and immunosuppressive drugs, initial encounter: Secondary | ICD-10-CM

## 2018-02-05 DIAGNOSIS — D6481 Anemia due to antineoplastic chemotherapy: Secondary | ICD-10-CM

## 2018-02-05 LAB — CBC WITH DIFFERENTIAL/PLATELET
ABS IMMATURE GRANULOCYTES: 0.05 10*3/uL (ref 0.00–0.07)
Basophils Absolute: 0 10*3/uL (ref 0.0–0.1)
Basophils Relative: 0 %
Eosinophils Absolute: 0.2 10*3/uL (ref 0.0–0.5)
Eosinophils Relative: 2 %
HCT: 35.2 % — ABNORMAL LOW (ref 36.0–46.0)
Hemoglobin: 11.5 g/dL — ABNORMAL LOW (ref 12.0–15.0)
Immature Granulocytes: 1 %
LYMPHS ABS: 1.1 10*3/uL (ref 0.7–4.0)
Lymphocytes Relative: 14 %
MCH: 28.8 pg (ref 26.0–34.0)
MCHC: 32.7 g/dL (ref 30.0–36.0)
MCV: 88.2 fL (ref 80.0–100.0)
Monocytes Absolute: 0.7 10*3/uL (ref 0.1–1.0)
Monocytes Relative: 8 %
Neutro Abs: 6 10*3/uL (ref 1.7–7.7)
Neutrophils Relative %: 75 %
Platelets: 261 10*3/uL (ref 150–400)
RBC: 3.99 MIL/uL (ref 3.87–5.11)
RDW: 13.3 % (ref 11.5–15.5)
WBC: 7.9 10*3/uL (ref 4.0–10.5)
nRBC: 0 % (ref 0.0–0.2)

## 2018-02-05 LAB — COMPREHENSIVE METABOLIC PANEL
ALBUMIN: 4.2 g/dL (ref 3.5–5.0)
ALT: 13 U/L (ref 0–44)
AST: 16 U/L (ref 15–41)
Alkaline Phosphatase: 91 U/L (ref 38–126)
Anion gap: 13 (ref 5–15)
BUN: 25 mg/dL — ABNORMAL HIGH (ref 8–23)
CHLORIDE: 103 mmol/L (ref 98–111)
CO2: 27 mmol/L (ref 22–32)
Calcium: 9.7 mg/dL (ref 8.9–10.3)
Creatinine, Ser: 1.06 mg/dL — ABNORMAL HIGH (ref 0.44–1.00)
GFR calc Af Amer: >60 mL/min (ref >60)
GFR calc non Af Amer: 55 mL/min — ABNORMAL LOW (ref >60)
Glucose, Bld: 104 mg/dL — ABNORMAL HIGH (ref 70–99)
Potassium: 4.1 mmol/L (ref 3.5–5.1)
SODIUM: 143 mmol/L (ref 135–145)
Total Bilirubin: 0.2 mg/dL — ABNORMAL LOW (ref 0.3–1.2)
Total Protein: 7.4 g/dL (ref 6.5–8.1)

## 2018-02-05 LAB — LACTATE DEHYDROGENASE: LDH: 162 U/L (ref 98–192)

## 2018-02-05 NOTE — Assessment & Plan Note (Signed)
We have previously discussed about urology referral and she is interested to get that set up.  I will put in consult today.

## 2018-02-05 NOTE — Telephone Encounter (Signed)
Gave avs and calendar ° °

## 2018-02-05 NOTE — Assessment & Plan Note (Addendum)
Her most recent CT imaging show no evidence of lymphoma recurrence but persistent kidney lesion, worrisome for primary kidney cancer From the lymphoma standpoint, she has no signs or symptoms to suggest cancer recurrence I plan to see her in 3 months and repeat imaging study in 6 months She is up-to-date with vaccination.  She is scheduled for mammogram next month along with colonoscopy in February.

## 2018-02-05 NOTE — Assessment & Plan Note (Signed)
She has mild persistent anemia but overall improving.  She is not symptomatic.  Observe only

## 2018-02-05 NOTE — Progress Notes (Signed)
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  She is scheduled for mammogram next month along with colonoscopy in February.   Anemia due to antineoplastic chemotherapy She has mild persistent anemia but overall improving.  She is not symptomatic.  Observe only  Kidney lesion, native, right We have previously discussed about urology referral and she is interested to get that set up.  I will put in consult today.   Orders Placed This Encounter  Procedures  . Ambulatory referral to Urology    Referral Priority:   Routine    Referral Type:   Consultation    Referral Reason:   Specialty Services Required    Requested Specialty:   Urology    Number of Visits Requested:   1    INTERVAL HISTORY: Please see below for problem oriented charting. She returns for further follow-up She was seen by her primary care doctor.  Unfortunately, urology appointment has not been set up She denies hematuria or flank pain. No new lymphadenopathy Denies recent infection, fever or chills. She has lost some weight.  Her blood pressure is satisfactory and much improved compared to prior visit  SUMMARY OF ONCOLOGIC HISTORY:   Diffuse large B cell lymphoma ([place])   12/12/2016 Imaging    CT scan showed  1. Extensive mesenteric, retroperitoneal, left hilar, mediastinal, left supraclavicular and left axillary adenopathy. Differential considerations include metastatic adenopathy, lymphoma and  leukemia. The size of the nodes in the mesentery and retroperitoneum are suggestive of non-Hodgkin's lymphoma. 2. 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 Differential considerations include metastatic disease and lymphoma. A left upper lobe primary lung carcinoma is a possibility. 3. 3.2 cm solid right renal mass. Differential considerations include renal cell carcinoma and oncocytoma. 4. Small amount of free peritoneal fluid, most likely due to lymphatic obstruction by the mesenteric adenopathy. 5. Left fourth rib fracture. 6. Colonic diverticulosis. 7. Calcific coronary artery and aortic atherosclerosis. Aortic Atherosclerosis (ICD10-I70.0).     12/12/2016 - 12/14/2016 Hospital Admission    He was briefly admitted and evaluated for weakness.  CT imaging showed diffuse disease suspicious for lymphoma and outpatient evaluation was arranged     12/18/2016 Imaging    MR thoracic spine Very limited examination demonstrating compression of the cord from approximately mid T6 to T7-8 by epidural tumor eccentric to the right and surrounding the cord both anteriorly and posteriorly. Tumor fills the right neural foramina at T6-7 and T7-8 and completely replaces the T7 vertebral body.    12/18/2016 Surgery    She underwent procedure: Decompressive thoracic laminectomy from T5-T8 for resection of epidural tumor      12/18/2016 Pathology Results    1. Soft tissue mass, simple excision, thoracic dorsal epidural mass - DIFFUSE LARGE B-CELL LYMPHOMA. - SEE ONCOLOGY TABLE. 2. Soft tissue mass, simple excision, thoracic dorsal epidural mass - DIFFUSE LARGE B-CELL LYMPHOMA. Microscopic Comment 1. LYMPHOMA Histologic type: Non-Hodgkin B-cell lymphoma: Diffuse large B-cell lymphoma. Grade (  if applicable): High grade. Flow cytometry: N/A. Immunohistochemical stains: CD20. CD3, CD5, CD10, CD23, CD30, CD15, PAX-5, Ki-67, bcl-2, bcl-6,  CD34. Touch preps/imprints: N/A. Comments: There is a diffuse infiltrate of atypical lymphocytes. The lymphocytes are medium to large in size with irregular nuclear contours. There are background smaller lymphocytes. While there are admixed smaller cells, there are diffuse areas of large cells. The large lymphocytes are positive for CD20, CD10, bcl-6, and bcl-2. CD3 and CD5 highlight admixed T-cells. CD34, CD30, CD23, and CD15 are negative. PAX-5 is positive. Ki-67 is variable with areas up to 70%. 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     12/18/2016 - 01/17/2017 Hospital Admission    The patient was readmitted to the hospital due to complete weakness secondary to spinal cord compression.  She underwent surgery and pathology report confirmed diagnosis of diffuse large B-cell lymphoma. She received IT chemo on 01/16/17    01/16/2017 Pathology Results    CEREBROSPINAL FLUID (SPECIMEN 1 OF 1 COLLECTED 01/16/17): NO MALIGNANT CELLS IDENTIFIED.    02/03/2017 - 02/07/2017 Hospital Admission    She is admitted for cycle 2 of chemo    02/24/2017 - 02/28/2017 Hospital Admission    She is admitted for cycle 3 of chemotherapy    03/13/2017 PET scan    1. Low level FDG uptake associated with previous areas of soft tissue tumor within the chest and abdomen. Deauville criteria 2 and 3 compatible with treated tumor. No sites of residual metabolically active tumor (Deauville criteria 4 or 5) identified. 2. Mild diffuse uptake throughout the bone marrow is noted which is nonspecific in may reflect treatment related changes. 3. Stable size of previously noted complex lesion arising from the posterior cortex of the right kidney which now measures fluid attenuation. This is incompletely characterized on today's given lack of IV contrast  material. 4. Aortic Atherosclerosis (ICD10-I70.0). Multi vessel coronary artery calcifications noted.    03/13/2017 Imaging    Study Conclusions  - Procedure narrative: Transthoracic echocardiography. Image quality was suboptimal. The study was technically difficult, as a result of poor acoustic windows, poor sound wave transmission, chest wall deformity, and body habitus. - Left ventricle: GLLS is normal at -19% The cavity size was normal. There was mild focal basal hypertrophy of the septum. Systolic function was vigorous. The estimated ejection fraction was in the range of 65% to 70%. Wall motion was normal; there were no regional wall motion abnormalities. There was an increased relative contribution of atrial contraction to ventricular filling. Doppler parameters are consistent with abnormal left ventricular relaxation (grade 1 diastolic dysfunction). - Pulmonary arteries: Systolic pressure could not be accurately estimated.    03/24/2017 - 03/28/2017 Hospital Admission    The patient was admitted to the hospital for cycle 4 of chemotherapy    04/21/2017 - 04/25/2017 Hospital Admission    She is admitted for cycle 5 of R-EPOCH    05/19/2017 - 05/23/2017 Hospital Admission    She is admitted for cycle 6 of R-EPOCH    06/20/2017 Imaging    1. No findings for residual or recurrent lymphoma involving the chest. The left fifth rib is healed and the surrounding extensive soft tissue density has completely resolved. 2. Interval regression of the vague mesenteric and retroperitoneal soft tissue densities and hazy interstitial soft tissue thickening around the mesenteric vessels. No new or enlarging lymph  nodes to  suggest recurrent lymphoma. 3. 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 4. Decompressive laminectomies in the midthoracic spine. No findings for recurrent spinal tumor.    07/04/2017 Procedure    Removal of implanted Port-A-Cath utilizing sharp and  blunt dissection. The procedure was uncomplicated.    12/25/2017 Imaging    IMPRESSION: Chest Impression:  1. No evidence of lymphoma recurrence in thorax.  Abdomen / Pelvis Impression:  1. No evidence of lymphoma recurrence in the abdomen pelvis. 2. Stable hazy peritoneal fat surrounding the mesenteric vessels. 3. Stable ill-defined nodular density in the LEFT retroperitoneum adjacent to the aorta. 4. Interval increase in size of RIGHT renal neoplasm     REVIEW OF SYSTEMS:   Constitutional: Denies fevers, chills or abnormal weight loss Eyes: Denies blurriness of vision Ears, nose, mouth, throat, and face: Denies mucositis or sore throat Respiratory: Denies cough, dyspnea or wheezes Cardiovascular: Denies palpitation, chest discomfort or lower extremity swelling Gastrointestinal:  Denies nausea, heartburn or change in bowel habits Skin: Denies abnormal skin rashes Lymphatics: Denies new lymphadenopathy or easy bruising Neurological:Denies numbness, tingling or new weaknesses Behavioral/Psych: Mood is stable, no new changes  All other systems were reviewed with the patient and are negative.  I have reviewed the past medical history, past surgical history, social history and family history with the patient and they are unchanged from previous note.  ALLERGIES:  is allergic to contrast media [iodinated diagnostic agents].  MEDICATIONS:  Current Outpatient Medications  Medication Sig Dispense Refill  . acyclovir (ZOVIRAX) 400 MG tablet Take 1 tablet (400 mg total) by mouth 2 (two) times daily. 60 tablet 9  . cholecalciferol (VITAMIN D3) 25 MCG (1000 UT) tablet Take 2,000 Units by mouth daily.    . Cyanocobalamin (VITAMIN B 12 PO) Take 2,000 mg by mouth.    . cyclobenzaprine (FLEXERIL) 10 MG tablet Take 1 tablet (10 mg total) by mouth 3 (three) times daily as needed for muscle spasms. 90 tablet 1  . Fexofenadine HCl (ALLERGY 24-HR PO) Take by mouth as needed.    . Na Sulfate-K  Sulfate-Mg Sulf (SUPREP BOWEL PREP KIT) 17.5-3.13-1.6 GM/177ML SOLN Take 1 kit by mouth as directed. 1 Bottle 0  . triamterene-hydrochlorothiazide (DYAZIDE) 37.5-25 MG capsule Take 1 each (1 capsule total) by mouth daily. 30 capsule 3   No current facility-administered medications for this visit.     PHYSICAL EXAMINATION: ECOG PERFORMANCE STATUS: 1 - Symptomatic but completely ambulatory  Vitals:   02/05/18 0935  BP: (!) 151/87  Pulse: 94  Resp: 18  Temp: 97.8 F (36.6 C)  SpO2: 93%   Filed Weights   02/05/18 0935  Weight: 215 lb (97.5 kg)    GENERAL:alert, no distress and comfortable SKIN: skin color, texture, turgor are normal, no rashes or significant lesions EYES: normal, Conjunctiva are pink and non-injected, sclera clear OROPHARYNX:no exudate, no erythema and lips, buccal mucosa, and tongue normal  NECK: supple, thyroid normal size, non-tender, without nodularity LYMPH:  no palpable lymphadenopathy in the cervical, axillary or inguinal LUNGS: clear to auscultation and percussion with normal breathing effort HEART: regular rate & rhythm and no murmurs and no lower extremity edema ABDOMEN:abdomen soft, non-tender and normal bowel sounds Musculoskeletal:no cyanosis of digits and no clubbing  NEURO: alert & oriented x 3 with fluent speech, no focal motor/sensory deficits  LABORATORY DATA:  I have reviewed the data as listed    Component Value Date/Time   NA 143 02/05/2018  0909   NA 134 (L) 02/19/2017 0925   K 4.1 02/05/2018 0909   K 3.0 (LL) 02/19/2017 0925   CL 103 02/05/2018 0909   CO2 27 02/05/2018 0909   CO2 29 02/19/2017 0925   GLUCOSE 104 (H) 02/05/2018 0909   GLUCOSE 135 02/19/2017 0925   BUN 25 (H) 02/05/2018 0909   BUN 7.4 02/19/2017 0925   CREATININE 1.06 (H) 02/05/2018 0909   CREATININE 0.9 02/19/2017 0925   CALCIUM 9.7 02/05/2018 0909   CALCIUM 8.9 02/19/2017 0925   PROT 7.4 02/05/2018 0909   PROT 6.4 02/19/2017 0925   ALBUMIN 4.2 02/05/2018 0909    ALBUMIN 3.4 (L) 02/19/2017 0925   AST 16 02/05/2018 0909   AST 16 02/19/2017 0925   ALT 13 02/05/2018 0909   ALT 13 02/19/2017 0925   ALKPHOS 91 02/05/2018 0909   ALKPHOS 86 02/19/2017 0925   BILITOT 0.2 (L) 02/05/2018 0909   BILITOT 0.65 02/19/2017 0925   GFRNONAA 55 (L) 02/05/2018 0909   GFRAA >60 02/05/2018 0909    No results found for: SPEP, UPEP  Lab Results  Component Value Date   WBC 7.9 02/05/2018   NEUTROABS 6.0 02/05/2018   HGB 11.5 (L) 02/05/2018   HCT 35.2 (L) 02/05/2018   MCV 88.2 02/05/2018   PLT 261 02/05/2018      Chemistry      Component Value Date/Time   NA 143 02/05/2018 0909   NA 134 (L) 02/19/2017 0925   K 4.1 02/05/2018 0909   K 3.0 (LL) 02/19/2017 0925   CL 103 02/05/2018 0909   CO2 27 02/05/2018 0909   CO2 29 02/19/2017 0925   BUN 25 (H) 02/05/2018 0909   BUN 7.4 02/19/2017 0925   CREATININE 1.06 (H) 02/05/2018 0909   CREATININE 0.9 02/19/2017 0925      Component Value Date/Time   CALCIUM 9.7 02/05/2018 0909   CALCIUM 8.9 02/19/2017 0925   ALKPHOS 91 02/05/2018 0909   ALKPHOS 86 02/19/2017 0925   AST 16 02/05/2018 0909   AST 16 02/19/2017 0925   ALT 13 02/05/2018 0909   ALT 13 02/19/2017 0925   BILITOT 0.2 (L) 02/05/2018 0909   BILITOT 0.65 02/19/2017 0925      All questions were answered. The patient knows to call the clinic with any problems, questions or concerns. No barriers to learning was detected.  I spent 15 minutes counseling the patient face to face. The total time spent in the appointment was 20 minutes and more than 50% was on counseling and review of test results  Heath Lark, MD 02/05/2018 10:24 AM

## 2018-02-20 ENCOUNTER — Ambulatory Visit (HOSPITAL_COMMUNITY)
Admission: RE | Admit: 2018-02-20 | Discharge: 2018-02-20 | Disposition: A | Discharge status: Home / Self Care | Payer: Medicare Other | Source: Ambulatory Visit | Attending: Family Medicine | Admitting: Family Medicine

## 2018-02-20 DIAGNOSIS — Z1231 Encounter for screening mammogram for malignant neoplasm of breast: Secondary | ICD-10-CM

## 2018-04-08 ENCOUNTER — Ambulatory Visit: Payer: Medicare Other | Admitting: Urology

## 2018-04-08 DIAGNOSIS — D3000 Benign neoplasm of unspecified kidney: Secondary | ICD-10-CM

## 2018-04-15 ENCOUNTER — Other Ambulatory Visit (HOSPITAL_COMMUNITY): Payer: Self-pay | Admitting: Urology

## 2018-04-15 ENCOUNTER — Other Ambulatory Visit: Payer: Self-pay | Admitting: Urology

## 2018-04-15 DIAGNOSIS — N2889 Other specified disorders of kidney and ureter: Secondary | ICD-10-CM

## 2018-04-17 ENCOUNTER — Encounter (HOSPITAL_COMMUNITY)
Admission: RE | Disposition: A | Discharge status: Home / Self Care | Payer: Self-pay | Source: Home / Self Care | Attending: Gastroenterology

## 2018-04-17 ENCOUNTER — Encounter (HOSPITAL_COMMUNITY): Payer: Self-pay | Admitting: *Deleted

## 2018-04-17 ENCOUNTER — Other Ambulatory Visit: Payer: Self-pay

## 2018-04-17 ENCOUNTER — Ambulatory Visit (HOSPITAL_COMMUNITY)
Admission: RE | Admit: 2018-04-17 | Discharge: 2018-04-17 | Disposition: A | Discharge status: Home / Self Care | Payer: Medicare Other | Attending: Gastroenterology | Admitting: Gastroenterology

## 2018-04-17 DIAGNOSIS — Z800 Family history of malignant neoplasm of digestive organs: Secondary | ICD-10-CM

## 2018-04-17 DIAGNOSIS — Z860100 Personal history of colon polyps, unspecified: Secondary | ICD-10-CM

## 2018-04-17 DIAGNOSIS — Z09 Encounter for follow-up examination after completed treatment for conditions other than malignant neoplasm: Secondary | ICD-10-CM | POA: Insufficient documentation

## 2018-04-17 DIAGNOSIS — Z79899 Other long term (current) drug therapy: Secondary | ICD-10-CM | POA: Insufficient documentation

## 2018-04-17 DIAGNOSIS — Z8249 Family history of ischemic heart disease and other diseases of the circulatory system: Secondary | ICD-10-CM | POA: Diagnosis not present

## 2018-04-17 DIAGNOSIS — Q438 Other specified congenital malformations of intestine: Secondary | ICD-10-CM | POA: Diagnosis not present

## 2018-04-17 DIAGNOSIS — D123 Benign neoplasm of transverse colon: Secondary | ICD-10-CM

## 2018-04-17 DIAGNOSIS — K648 Other hemorrhoids: Secondary | ICD-10-CM | POA: Insufficient documentation

## 2018-04-17 DIAGNOSIS — Z8572 Personal history of non-Hodgkin lymphomas: Secondary | ICD-10-CM | POA: Insufficient documentation

## 2018-04-17 DIAGNOSIS — K644 Residual hemorrhoidal skin tags: Secondary | ICD-10-CM | POA: Insufficient documentation

## 2018-04-17 DIAGNOSIS — K5730 Diverticulosis of large intestine without perforation or abscess without bleeding: Secondary | ICD-10-CM | POA: Diagnosis not present

## 2018-04-17 DIAGNOSIS — I10 Essential (primary) hypertension: Secondary | ICD-10-CM | POA: Insufficient documentation

## 2018-04-17 HISTORY — DX: Non-Hodgkin lymphoma, unspecified, unspecified site: C85.90

## 2018-04-17 HISTORY — PX: POLYPECTOMY: SHX5525

## 2018-04-17 HISTORY — PX: COLONOSCOPY: SHX5424

## 2018-04-17 SURGERY — COLONOSCOPY
Anesthesia: Moderate Sedation

## 2018-04-17 MED ORDER — STERILE WATER FOR IRRIGATION IR SOLN
Status: DC | PRN
  Administered 2018-04-17: 1.5 mL

## 2018-04-17 MED ORDER — SODIUM CHLORIDE 0.9 % IV SOLN
INTRAVENOUS | Status: DC
  Administered 2018-04-17: 11:00:00 via INTRAVENOUS

## 2018-04-17 MED ORDER — MEPERIDINE HCL 100 MG/ML IJ SOLN
INTRAMUSCULAR | Status: AC
  Filled 2018-04-17: qty 2

## 2018-04-17 MED ORDER — MIDAZOLAM HCL 5 MG/5ML IJ SOLN
INTRAMUSCULAR | Status: DC | PRN
  Administered 2018-04-17 (×2): 2 mg via INTRAVENOUS
  Administered 2018-04-17: 1 mg via INTRAVENOUS

## 2018-04-17 MED ORDER — MEPERIDINE HCL 100 MG/ML IJ SOLN
INTRAMUSCULAR | Status: DC | PRN
  Administered 2018-04-17: 50 mg
  Administered 2018-04-17: 25 mg

## 2018-04-17 MED ORDER — MIDAZOLAM HCL 5 MG/5ML IJ SOLN
INTRAMUSCULAR | Status: AC
  Filled 2018-04-17: qty 10

## 2018-04-17 NOTE — Op Note (Signed)
[facility] Patient Name: Katrina [name] Procedure Date: 04/17/2018 11:00 AM MRN: [id number] Date of Birth: 22-May-1950 Attending MD: Barney Drain MD, MD CSN: [id number] Age: 68 Admit Type: Outpatient Procedure:                Colonoscopy WITH COLD SNARE POLYPECTOMY Indications:              Family history of colon cancer in multiple                            first-degree relatives(SISTER AGE > 49, BROTHER AGE                            < 60), Personal history of colonic polyps: 5 simple                            adenomas removed in 2015. Providers:                Barney Drain MD, MD, Gerome Sam, RN, Nelma Rothman, Technician Referring MD:             Lemmie Evens Medicines:                Meperidine 75 mg IV, Midazolam 5 mg IV Complications:            No immediate complications. Estimated Blood Loss:     Estimated blood loss was minimal. Procedure:                Pre-Anesthesia Assessment:                           - Prior to the procedure, a History and Physical                            was performed, and patient medications and                            allergies were reviewed. The patient's tolerance of                            previous anesthesia was also reviewed. The risks                            and benefits of the procedure and the sedation                            options and risks were discussed with the patient.                            All questions were answered, and informed consent                            was obtained. Prior Anticoagulants: The patient has  taken naproxen, last dose was 2 days prior to                            procedure. ASA Grade Assessment: II - A patient                            with mild systemic disease. After reviewing the                            risks and benefits, the patient was deemed in                            satisfactory condition to undergo the  procedure.                            After obtaining informed consent, the colonoscope                            was passed under direct vision. Throughout the                            procedure, the patient's blood pressure, pulse, and                            oxygen saturations were monitored continuously. The                            [id number]-[id number] ([id number]) scope was introduced through                            the anus and advanced to the the cecum, identified                            by appendiceal orifice and ileocecal valve. The                            colonoscopy was somewhat difficult due to a                            tortuous colon. Successful completion of the                            procedure was aided by increasing the dose of                            sedation medication, straightening and shortening                            the scope to obtain bowel loop reduction and                            COLOWRAP. The patient tolerated the procedure  fairly well. The quality of the bowel preparation                            was good. The ileocecal valve, appendiceal orifice,                            and rectum were photographed. Scope In: 11:26:08 AM Scope Out: 93:57:01 AM Scope Withdrawal Time: 0 hours 17 minutes 7 seconds  Total Procedure Duration: 0 hours 20 minutes 40 seconds  Findings:      A 4 mm polyp was found in the splenic flexure. The polyp was sessile.       The polyp was removed with a cold snare. Resection and retrieval were       complete.      Multiple small and large-mouthed diverticula were found in the       recto-sigmoid colon, sigmoid colon, descending colon and splenic flexure.      External and internal hemorrhoids were found.      The recto-sigmoid colon and sigmoid colon were mildly tortuous. Impression:               - One 4 mm polyp at the splenic flexure, removed                            with a cold  snare. Resected and retrieved.                           - Diverticulosis in the recto-sigmoid colon, in the                            sigmoid colon, in the descending colon and at the                            splenic flexure.                           - External and internal hemorrhoids.                           - Tortuous LEFT colon. Moderate Sedation:      Moderate (conscious) sedation was administered by the endoscopy nurse       and supervised by the endoscopist. The following parameters were       monitored: oxygen saturation, heart rate, blood pressure, and response       to care. Total physician intraservice time was 31 minutes. Recommendation:           - Patient has a contact number available for                            emergencies. The signs and symptoms of potential                            delayed complications were discussed with the                            patient. Return to normal activities tomorrow.  Written discharge instructions were provided to the                            patient.                           - High fiber diet.                           - Continue present medications.                           - Await pathology results.                           - Repeat colonoscopy in 5 years for surveillance. Procedure Code(s):        --- Professional ---                           [phone], Colonoscopy, flexible; with removal of                            tumor(s), polyp(s), or other lesion(s) by snare                            technique                           99153, Moderate sedation; each additional 15                            minutes intraservice time                           G0500, Moderate sedation services provided by the                            same physician or other qualified health care                            professional performing a gastrointestinal                            endoscopic service that  sedation supports,                            requiring the presence of an independent trained                            observer to assist in the monitoring of the                            patient's level of consciousness and physiological                            status; initial 15 minutes of intra-service time;  patient age 75 years or older (additional time may                            be reported with [phone], as appropriate) Diagnosis Code(s):        --- Professional ---                           D12.3, Benign neoplasm of transverse colon (hepatic                            flexure or splenic flexure)                           K64.8, Other hemorrhoids                           Z80.0, Family history of malignant neoplasm of                            digestive organs                           Z86.010, Personal history of colonic polyps                           K57.30, Diverticulosis of large intestine without                            perforation or abscess without bleeding                           Q43.8, Other specified congenital malformations of                            intestine CPT copyright 2018 American [employer] Association. All rights reserved. The codes documented in this report are preliminary and upon coder review may  be revised to meet current compliance requirements. Barney Drain, MD Barney Drain MD, MD 04/17/2018 12:05:58 PM This report has been signed electronically. Number of Addenda: 0

## 2018-04-17 NOTE — H&P (Signed)
Primary Care Physician:  Lemmie Evens, MD Primary Gastroenterologist:  Dr. Oneida Alar  Pre-Procedure History & Physical: HPI:  Katrina [name] is a 68 y.o. female here for  PERSONAL HISTORY OF POLYPS.  Past Medical History:  Diagnosis Date  . Anemia   . Arthritis   . 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 Hypertension   . Lymphadenopathy 11/2016   "extensive"  . Non Hodgkin's lymphoma ([place]) 2019  . Paresthesia of lower extremity 11/2016   bilateral  . Pulmonary nodules 11/2016  . Right renal mass 11/2016    Past Surgical History:  Procedure Laterality Date  . ABDOMINAL HYSTERECTOMY    . BREAST LUMPECTOMY     left  . COLONOSCOPY N/A 11/02/2013   Procedure: COLONOSCOPY;  Surgeon: Danie Binder, MD;  [facility];  Service: Endoscopy;  Laterality: N/A;  1:15 - moved to [place] notified pt  . IR FLUORO GUIDE PORT INSERTION RIGHT  12/30/2016  . IR REMOVAL TUN ACCESS W/ PORT W/O FL MOD SED  07/04/2017  . IR US GUIDE VASC ACCESS RIGHT  12/30/2016  . LAMINECTOMY N/A 12/18/2016   Procedure: DECOMPRESSION T5-8/THORACIC;  Surgeon: Kary Kos, MD;  Location: [facility];  Service: Neurosurgery;  Laterality: N/A;  Decompressive Thoracic Laminectomy, Thoracic five - six, Thoraic six - seven, Thoracic seven - eight  . SHOULDER SURGERY     right    Prior to Admission medications   Medication Sig Start Date End Date Taking? Authorizing Provider  Chlorpheniramine Maleate (CHLOR-TABLETS PO) Take 1 tablet by mouth daily as needed (allergies).   Yes [provider]  cholecalciferol (VITAMIN D3) 25 MCG (1000 UT) tablet Take 2,000 Units by mouth every morning.    Yes [provider]  Cyanocobalamin (VITAMIN B 12 PO) Take 2,000 mg by mouth every morning.    Yes [provider]  cyclobenzaprine (FLEXERIL) 10 MG tablet Take 1 tablet (10 mg total) by mouth 3 (three) times daily as needed for muscle spasms. Patient taking differently: Take 10 mg by mouth at bedtime.  08/28/17  Yes  Gorsuch, Ni, MD  Na Sulfate-K Sulfate-Mg Sulf (SUPREP BOWEL PREP KIT) 17.5-3.13-1.6 GM/177ML SOLN Take 1 kit by mouth as directed. 01/26/18  Yes Annitta Needs, NP  naproxen sodium (ALEVE) 220 MG tablet Take 220 mg by mouth daily as needed (pain).   Yes [provider]  senna (SENOKOT) 8.6 MG tablet Take 3 tablets by mouth every morning.   Yes [provider]  triamterene-hydrochlorothiazide (DYAZIDE) 37.5-25 MG capsule Take 1 each (1 capsule total) by mouth daily. Patient taking differently: Take 1 capsule by mouth every morning.  06/17/17  Yes Gorsuch, Ni, MD  acyclovir (ZOVIRAX) 400 MG tablet Take 1 tablet (400 mg total) by mouth 2 (two) times daily. Patient not taking: Reported on 04/13/2018 02/19/17   Heath Lark, MD    Allergies as of 01/26/2018 - Review Complete 01/26/2018  Allergen Reaction Noted  . Contrast media [iodinated diagnostic agents] Hives and Itching 12/12/2016    Family History  Problem Relation Age of Onset  . Colon cancer Sister        at age 68  . Colon cancer Brother        at age 44  . COPD Mother   . Diabetes Mellitus II Mother   . CAD Mother        Angina  . Colon cancer Father   . Thyroid disease Sister   . Diabetes Mellitus II Sister  Social History   Socioeconomic History  . Marital status: Widowed    Spouse name: Not on file  . Number of children: Not on file  . Years of education: Not on file  . Highest education level: Not on file  Occupational History  . Not on file  Social Needs  . Financial resource strain: Not on file  . Food insecurity:    Worry: Not on file    Inability: Not on file  . Transportation needs:    Medical: Not on file    Non-medical: Not on file  Tobacco Use  . Smoking status: Never Smoker  . Smokeless tobacco: Never Used  Substance and Sexual Activity  . Alcohol use: No  . Drug use: No  . Sexual activity: Never  Lifestyle  . Physical activity:    Days per week: Not on file    Minutes per  session: Not on file  . Stress: Not on file  Relationships  . Social connections:    Talks on phone: Not on file    Gets together: Not on file    Attends religious service: Not on file    Active member of club or organization: Not on file    Attends meetings of clubs or organizations: Not on file    Relationship status: Not on file  . Intimate partner violence:    Fear of current or ex partner: Not on file    Emotionally abused: Not on file    Physically abused: Not on file    Forced sexual activity: Not on file  Other Topics Concern  . Not on file  Social History Narrative  . Not on file    Review of Systems: See HPI, otherwise negative ROS   Physical Exam: BP (!) 156/98   Pulse 99   Temp 97.9 F (36.6 C) (Oral)   Resp 16   Ht 5' 8"  (1.727 m)   Wt 99.8 kg   SpO2 96%   BMI 33.45 kg/m  General:   Alert,  pleasant and cooperative in NAD Head:  Normocephalic and atraumatic. Neck:  Supple; Lungs:  Clear throughout to auscultation.    Heart:  Regular rate and rhythm. Abdomen:  Soft, nontender and nondistended. Normal bowel sounds, without guarding, and without rebound.   Neurologic:  Alert and  oriented x4;  grossly normal neurologically.  Impression/Plan:    SCREENING  Plan:  1. TCS TODAY DISCUSSED PROCEDURE, BENEFITS, & RISKS: < 1% chance of medication reaction, bleeding, perforation, or rupture of spleen/liver.

## 2018-04-17 NOTE — Discharge Instructions (Signed)
You have small internal hemorrhoids and diverticulosis IN YOUR LEFT COLON. YOU HAD ONE SMALL POLYP REMOVED.    DRINK WATER TO KEEP YOUR URINE LIGHT YELLOW.  CONTINUE YOUR WEIGHT LOSS EFFORTS. YOUR BODY MASS INDEX IS OVER 30 WHICH MEANS YOU ARE OBESE. OBESITY IS ASSOCIATED WITH AN INCREASE FOR ALL CANCERS, INCLUDING ESOPHAGEAL AND COLON CANCER. A WEIGHT OF 195 LBS OR LESS  WILL KEEP YOUR BODY MASS INDEX(BMI) UNDER 30.  FOLLOW A HIGH FIBER DIET. AVOID ITEMS THAT CAUSE BLOATING. See info below.   USE PREPARATION H FOUR TIMES  A DAY IF NEEDED TO RELIEVE RECTAL PAIN/PRESSURE/BLEEDING.  Next colonoscopy in 5 years.  Colonoscopy Care After Read the instructions outlined below and refer to this sheet in the next week. These discharge instructions provide you with general information on caring for yourself after you leave the hospital. While your treatment has been planned according to the most current medical practices available, unavoidable complications occasionally occur. If you have any problems or questions after discharge, call DR. Jesselle Laflamme, [phone].  ACTIVITY  You may resume your regular activity, but move at a slower pace for the next 24 hours.   Take frequent rest periods for the next 24 hours.   Walking will help get rid of the air and reduce the bloated feeling in your belly (abdomen).   No driving for 24 hours (because of the medicine (anesthesia) used during the test).   You may shower.   Do not sign any important legal documents or operate any machinery for 24 hours (because of the anesthesia used during the test).    NUTRITION  Drink plenty of fluids.   You may resume your normal diet as instructed by your doctor.   Begin with a light meal and progress to your normal diet. Heavy or fried foods are harder to digest and may make you feel sick to your stomach (nauseated).   Avoid alcoholic beverages for 24 hours or as instructed.    MEDICATIONS  You may resume  your normal medications.   WHAT YOU CAN EXPECT TODAY  Some feelings of bloating in the abdomen.   Passage of more gas than usual.   Spotting of blood in your stool or on the toilet paper  .  IF YOU HAD POLYPS REMOVED DURING THE COLONOSCOPY:  Eat a soft diet IF YOU HAVE NAUSEA, BLOATING, ABDOMINAL PAIN, OR VOMITING.    FINDING OUT THE RESULTS OF YOUR TEST Not all test results are available during your visit. DR. Oneida Alar WILL CALL YOU WITHIN 14 DAYS OF YOUR PROCEDUE WITH YOUR RESULTS. Do not assume everything is normal if you have not heard from DR. Gurtej Noyola, CALL HER OFFICE AT [phone].  SEEK IMMEDIATE MEDICAL ATTENTION AND CALL THE OFFICE: [phone] IF:  You have more than a spotting of blood in your stool.   Your belly is swollen (abdominal distention).   You are nauseated or vomiting.   You have a temperature over 101F.   You have abdominal pain or discomfort that is severe or gets worse throughout the day.  High-Fiber Diet A high-fiber diet changes your normal diet to include more whole grains, legumes, fruits, and vegetables. Changes in the diet involve replacing refined carbohydrates with unrefined foods. The calorie level of the diet is essentially unchanged. The Dietary Reference Intake (recommended amount) for adult males is 38 grams per day. For adult females, it is 25 grams per day. Pregnant and lactating women should consume 28 grams of fiber per day. Fiber  is the intact part of a plant that is not broken down during digestion. Functional fiber is fiber that has been isolated from the plant to provide a beneficial effect in the body.  PURPOSE  Increase stool bulk.   Ease and regulate bowel movements.   Lower cholesterol.   REDUCE RISK OF COLON CANCER  INDICATIONS THAT YOU NEED MORE FIBER  Constipation and hemorrhoids.   Uncomplicated diverticulosis (intestine condition) and irritable bowel syndrome.   Weight management.   As a protective measure  against hardening of the arteries (atherosclerosis), diabetes, and cancer.   GUIDELINES FOR INCREASING FIBER IN THE DIET  Start adding fiber to the diet slowly. A gradual increase of about 5 more grams (2 slices of whole-wheat bread, 2 servings of most fruits or vegetables, or 1 bowl of high-fiber cereal) per day is best. Too rapid an increase in fiber may result in constipation, flatulence, and bloating.   Drink enough water and fluids to keep your urine clear or pale yellow. Water, juice, or caffeine-free drinks are recommended. Not drinking enough fluid may cause constipation.   Eat a variety of high-fiber foods rather than one type of fiber.   Try to increase your intake of fiber through using high-fiber foods rather than fiber pills or supplements that contain small amounts of fiber.   The goal is to change the types of food eaten. Do not supplement your present diet with high-fiber foods, but replace foods in your present diet.   INCLUDE A VARIETY OF FIBER SOURCES  Replace refined and processed grains with whole grains, canned fruits with fresh fruits, and incorporate other fiber sources. White rice, white breads, and most bakery goods contain little or no fiber.   Brown whole-grain rice, buckwheat oats, and many fruits and vegetables are all good sources of fiber. These include: broccoli, Brussels sprouts, cabbage, cauliflower, beets, sweet potatoes, white potatoes (skin on), carrots, tomatoes, eggplant, squash, berries, fresh fruits, and dried fruits.   Cereals appear to be the richest source of fiber. Cereal fiber is found in whole grains and bran. Bran is the fiber-rich outer coat of cereal grain, which is largely removed in refining. In whole-grain cereals, the bran remains. In breakfast cereals, the largest amount of fiber is found in those with "bran" in their names. The fiber content is sometimes indicated on the label.   You may need to include additional fruits and vegetables  each day.   In baking, for 1 cup white flour, you may use the following substitutions:   1 cup whole-wheat flour minus 2 tablespoons.   1/2 cup white flour plus 1/2 cup whole-wheat flour.   Polyps, Colon  A polyp is extra tissue that grows inside your body. Colon polyps grow in the large intestine. The large intestine, also called the colon, is part of your digestive system. It is a long, hollow tube at the end of your digestive tract where your body makes and stores stool. Most polyps are not dangerous. They are benign. This means they are not cancerous. But over time, some types of polyps can turn into cancer. Polyps that are smaller than a pea are usually not harmful. But larger polyps could someday become or may already be cancerous. To be safe, doctors remove all polyps and test them.   PREVENTION There is not one sure way to prevent polyps. You might be able to lower your risk of getting them if you:  Eat more fruits and vegetables and less fatty food.  Do not smoke.   Avoid alcohol.   Exercise every day.   Lose weight if you are overweight.   Eating more calcium and folate can also lower your risk of getting polyps. Some foods that are rich in calcium are milk, cheese, and broccoli. Some foods that are rich in folate are chickpeas, kidney beans, and spinach.    Diverticulosis Diverticulosis is a common condition that develops when small pouches (diverticula) form in the wall of the colon. The risk of diverticulosis increases with age. It happens more often in people who eat a low-fiber diet. Most individuals with diverticulosis have no symptoms. Those individuals with symptoms usually experience belly (abdominal) pain, constipation, or loose stools (diarrhea).  HOME CARE INSTRUCTIONS  Increase the amount of fiber in your diet as directed by your caregiver or dietician. This may reduce symptoms of diverticulosis.   Drink at least 6 to 8 glasses of water each day to prevent  constipation.   Try not to strain when you have a bowel movement.   Avoiding nuts and seeds to prevent complications is NOT NECESSARY.   FOODS HAVING HIGH FIBER CONTENT INCLUDE:  Fruits. Apple, peach, pear, tangerine, raisins, prunes.   Vegetables. Brussels sprouts, asparagus, broccoli, cabbage, carrot, cauliflower, romaine lettuce, spinach, summer squash, tomato, winter squash, zucchini.   Starchy Vegetables. Baked beans, kidney beans, lima beans, split peas, lentils, potatoes (with skin).   Grains. Whole wheat bread, brown rice, bran flake cereal, plain oatmeal, white rice, shredded wheat, bran muffins.   SEEK IMMEDIATE MEDICAL CARE IF:  You develop increasing pain or severe bloating.   You have an oral temperature above 101F.   You develop vomiting or bowel movements that are bloody or black.

## 2018-04-21 ENCOUNTER — Other Ambulatory Visit: Payer: Self-pay | Admitting: Radiology

## 2018-04-21 NOTE — Progress Notes (Signed)
CC'D TO PCP AND ON RECALL  °

## 2018-04-21 NOTE — Progress Notes (Signed)
ON RECALL AND CC'D TO PCP °

## 2018-04-21 NOTE — Progress Notes (Signed)
PT is aware.

## 2018-04-22 ENCOUNTER — Encounter (HOSPITAL_COMMUNITY): Payer: Self-pay | Admitting: Gastroenterology

## 2018-04-22 ENCOUNTER — Ambulatory Visit (HOSPITAL_COMMUNITY)
Admission: RE | Admit: 2018-04-22 | Discharge: 2018-04-22 | Disposition: A | Discharge status: Home / Self Care | Payer: Medicare Other | Source: Ambulatory Visit | Attending: Urology | Admitting: Urology

## 2018-04-22 ENCOUNTER — Other Ambulatory Visit: Payer: Self-pay

## 2018-04-22 DIAGNOSIS — D649 Anemia, unspecified: Secondary | ICD-10-CM | POA: Diagnosis not present

## 2018-04-22 DIAGNOSIS — Z79899 Other long term (current) drug therapy: Secondary | ICD-10-CM | POA: Insufficient documentation

## 2018-04-22 DIAGNOSIS — Z800 Family history of malignant neoplasm of digestive organs: Secondary | ICD-10-CM | POA: Diagnosis not present

## 2018-04-22 DIAGNOSIS — C8330 Diffuse large B-cell lymphoma, unspecified site: Secondary | ICD-10-CM | POA: Insufficient documentation

## 2018-04-22 DIAGNOSIS — C641 Malignant neoplasm of right kidney, except renal pelvis: Secondary | ICD-10-CM | POA: Diagnosis not present

## 2018-04-22 DIAGNOSIS — N2889 Other specified disorders of kidney and ureter: Secondary | ICD-10-CM

## 2018-04-22 DIAGNOSIS — I10 Essential (primary) hypertension: Secondary | ICD-10-CM | POA: Diagnosis not present

## 2018-04-22 LAB — CBC
HCT: 34.8 % — ABNORMAL LOW (ref 36.0–46.0)
HEMOGLOBIN: 11 g/dL — AB (ref 12.0–15.0)
MCH: 27.7 pg (ref 26.0–34.0)
MCHC: 31.6 g/dL (ref 30.0–36.0)
MCV: 87.7 fL (ref 80.0–100.0)
Platelets: 277 10*3/uL (ref 150–400)
RBC: 3.97 MIL/uL (ref 3.87–5.11)
RDW: 13.8 % (ref 11.5–15.5)
WBC: 7.8 10*3/uL (ref 4.0–10.5)
nRBC: 0 % (ref 0.0–0.2)

## 2018-04-22 LAB — PROTIME-INR
INR: 1 (ref 0.8–1.2)
Prothrombin Time: 12.7 seconds (ref 11.4–15.2)

## 2018-04-22 MED ORDER — SODIUM CHLORIDE 0.9 % IV SOLN
INTRAVENOUS | Status: DC
  Administered 2018-04-22: 11:00:00 via INTRAVENOUS

## 2018-04-22 MED ORDER — MIDAZOLAM HCL 2 MG/2ML IJ SOLN
INTRAMUSCULAR | Status: AC | PRN
  Administered 2018-04-22: 1 mg via INTRAVENOUS
  Administered 2018-04-22: 0.5 mg via INTRAVENOUS

## 2018-04-22 MED ORDER — LIDOCAINE HCL 1 % IJ SOLN
INTRAMUSCULAR | Status: AC
  Filled 2018-04-22: qty 20

## 2018-04-22 MED ORDER — FENTANYL CITRATE (PF) 100 MCG/2ML IJ SOLN
INTRAMUSCULAR | Status: AC | PRN
  Administered 2018-04-22 (×2): 50 ug via INTRAVENOUS

## 2018-04-22 MED ORDER — HYDROCODONE-ACETAMINOPHEN 5-325 MG PO TABS
1.0000 | ORAL_TABLET | ORAL | Status: DC | PRN
  Filled 2018-04-22: qty 2

## 2018-04-22 MED ORDER — MIDAZOLAM HCL 2 MG/2ML IJ SOLN
INTRAMUSCULAR | Status: AC
  Filled 2018-04-22: qty 4

## 2018-04-22 MED ORDER — GELATIN ABSORBABLE 12-7 MM EX MISC
CUTANEOUS | Status: AC
  Filled 2018-04-22: qty 1

## 2018-04-22 MED ORDER — FENTANYL CITRATE (PF) 100 MCG/2ML IJ SOLN
INTRAMUSCULAR | Status: AC
  Filled 2018-04-22: qty 4

## 2018-04-22 NOTE — Discharge Instructions (Addendum)
Percutaneous Kidney Biopsy, Care After °This sheet gives you information about how to care for yourself after your procedure. Your health care provider may also give you more specific instructions. If you have problems or questions, contact your health care provider. °What can I expect after the procedure? °After the procedure, it is common to have: °· Pain or soreness near the area where the needle went through your skin (biopsy site). °· Bright pink or cloudy urine for 24 hours after the procedure. °Follow these instructions at home: °Activity °· Return to your normal activities as told by your health care provider. Ask your health care provider what activities are safe for you. °· Do not drive for 24 hours if you were given a medicine to help you relax (sedative). °· Do not lift anything that is heavier than 10 lb (4.5 kg) until your health care provider tells you that it is safe. °· Avoid activities that take a lot of effort (are strenuous) until your health care provider approves. Most people will have to wait 2 weeks before returning to activities such as exercise or sexual intercourse. °General instructions ° °· Take over-the-counter and prescription medicines only as told by your health care provider. °· You may eat and drink after your procedure. Follow instructions from your health care provider about eating or drinking restrictions. °· Check your biopsy site every day for signs of infection. Check for: °? More redness, swelling, or pain. °? More fluid or blood. °? Warmth. °? Pus or a bad smell. °· Keep all follow-up visits as told by your health care provider. This is important. °Contact a health care provider if: °· You have more redness, swelling, or pain around your biopsy site. °· You have more fluid or blood coming from your biopsy site. °· Your biopsy site feels warm to the touch. °· You have pus or a bad smell coming from your biopsy site. °· You have blood in your urine more than 24 hours after  your procedure. °Get help right away if: °· You have dark red or brown urine. °· You have a fever. °· You are unable to urinate. °· You feel burning when you urinate. °· You feel faint. °· You have severe pain in your abdomen or side. °This information is not intended to replace advice given to you by your health care provider. Make sure you discuss any questions you have with your health care provider. °Document Released: 10/07/2012 Document Revised: 11/17/2015 Document Reviewed: 11/17/2015 °[employer] Interactive Patient Education © 2019 [employer] °Moderate Conscious Sedation, Adult, Care After °These instructions provide you with information about caring for yourself after your procedure. Your health care provider may also give you more specific instructions. Your treatment has been planned according to current medical practices, but problems sometimes occur. Call your health care provider if you have any problems or questions after your procedure. °What can I expect after the procedure? °After your procedure, it is common: °· To feel sleepy for several hours. °· To feel clumsy and have poor balance for several hours. °· To have poor judgment for several hours. °· To vomit if you eat too soon. °Follow these instructions at home: °For at least 24 hours after the procedure: ° °· Do not: °? Participate in activities where you could fall or become injured. °? Drive. °? Use heavy machinery. °? Drink alcohol. °? Take sleeping pills or medicines that cause drowsiness. °? Make important decisions or sign legal documents. °? Take care of children on   your own. °· Rest. °Eating and drinking °· Follow the diet recommended by your health care provider. °· If you vomit: °? Drink water, juice, or soup when you can drink without vomiting. °? Make sure you have little or no nausea before eating solid foods. °General instructions °· Have a responsible adult stay with you until you are awake and alert. °· Take over-the-counter  and prescription medicines only as told by your health care provider. °· If you smoke, do not smoke without supervision. °· Keep all follow-up visits as told by your health care provider. This is important. °Contact a health care provider if: °· You keep feeling nauseous or you keep vomiting. °· You feel light-headed. °· You develop a rash. °· You have a fever. °Get help right away if: °· You have trouble breathing. °This information is not intended to replace advice given to you by your health care provider. Make sure you discuss any questions you have with your health care provider. °Document Released: 11/25/2012 Document Revised: 07/10/2015 Document Reviewed: 05/27/2015 °[employer] Interactive Patient Education © 2019 [employer] ° °

## 2018-04-22 NOTE — H&P (Signed)
Chief Complaint: Patient was seen in consultation today for right renal mass biopsy.  Referring Physician(s): McKenzie,Patrick L  Supervising Physician: Markus Daft  Patient Status: [name] - Out[facility]pt  History of Present Illness: Katrina [name] is a 68 y.o. female with a past medical history significant for arthritis, anemia, HTN and diffuse large B cell lymphoma followed by Dr. Alvy Bimler who presents today for an image guided right renal mass biopsy at the request of Dr. Alyson Ingles. Most recent CT abd/pelvis 12/25/17 showing no lymphoma recurrence however it was noted that a previously known right renal mass was enlarging. She was referred to urology for further evaluation and they have recommended a biopsy of this mass to further direct treatment.  Patient reports she feels fine overall, denies any complaints. She understands why she is here and what procedure she is scheduled for.   Past Medical History:  Diagnosis Date  . Anemia   . Arthritis   . 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 Hypertension   . Lymphadenopathy 11/2016   "extensive"  . Non Hodgkin's lymphoma ([place]) 2019  . Paresthesia of lower extremity 11/2016   bilateral  . Pulmonary nodules 11/2016  . Right renal mass 11/2016    Past Surgical History:  Procedure Laterality Date  . ABDOMINAL HYSTERECTOMY    . BREAST LUMPECTOMY     left  . COLONOSCOPY N/A 11/02/2013   Procedure: COLONOSCOPY;  Surgeon: Danie Binder, MD;  [facility];  Service: Endoscopy;  Laterality: N/A;  1:15 - moved to [place] notified pt  . IR FLUORO GUIDE PORT INSERTION RIGHT  12/30/2016  . IR REMOVAL TUN ACCESS W/ PORT W/O FL MOD SED  07/04/2017  . IR US GUIDE VASC ACCESS RIGHT  12/30/2016  . LAMINECTOMY N/A 12/18/2016   Procedure: DECOMPRESSION T5-8/THORACIC;  Surgeon: Kary Kos, MD;  Location: [place];  Service: Neurosurgery;  Laterality: N/A;  Decompressive Thoracic Laminectomy, Thoracic five - six, Thoraic six - seven, Thoracic seven - eight  .  SHOULDER SURGERY     right    Allergies: Contrast media [iodinated diagnostic agents]  Medications: Prior to Admission medications   Medication Sig Start Date End Date Taking? Authorizing Provider  Chlorpheniramine Maleate (CHLOR-TABLETS PO) Take 1 tablet by mouth daily as needed (allergies).   Yes [provider]  cholecalciferol (VITAMIN D3) 25 MCG (1000 UT) tablet Take 2,000 Units by mouth every morning.    Yes [provider]  Cyanocobalamin (VITAMIN B 12 PO) Take 2,000 mg by mouth every morning.    Yes [provider]  cyclobenzaprine (FLEXERIL) 10 MG tablet Take 1 tablet (10 mg total) by mouth 3 (three) times daily as needed for muscle spasms. Patient taking differently: Take 10 mg by mouth at bedtime.  08/28/17  Yes Gorsuch, Ni, MD  naproxen sodium (ALEVE) 220 MG tablet Take 220 mg by mouth daily as needed (pain).   Yes [provider]  senna (SENOKOT) 8.6 MG tablet Take 3 tablets by mouth every morning.   Yes [provider]  triamterene-hydrochlorothiazide (DYAZIDE) 37.5-25 MG capsule Take 1 each (1 capsule total) by mouth daily. Patient taking differently: Take 1 capsule by mouth every morning.  06/17/17  Yes Heath Lark, MD     Family History  Problem Relation Age of Onset  . Colon cancer Sister        at age 74  . Colon cancer Brother        at age 64  . COPD  Mother   . Diabetes Mellitus II Mother   . CAD Mother        Angina  . Colon cancer Father   . Thyroid disease Sister   . Diabetes Mellitus II Sister     Social History   Socioeconomic History  . Marital status: Widowed    Spouse name: Not on file  . Number of children: Not on file  . Years of education: Not on file  . Highest education level: Not on file  Occupational History  . Not on file  Social Needs  . Financial resource strain: Not on file  . Food insecurity:    Worry: Not on file    Inability: Not on file  . Transportation needs:    Medical: Not on  file    Non-medical: Not on file  Tobacco Use  . Smoking status: Never Smoker  . Smokeless tobacco: Never Used  Substance and Sexual Activity  . Alcohol use: No  . Drug use: No  . Sexual activity: Never  Lifestyle  . Physical activity:    Days per week: Not on file    Minutes per session: Not on file  . Stress: Not on file  Relationships  . Social connections:    Talks on phone: Not on file    Gets together: Not on file    Attends religious service: Not on file    Active member of club or organization: Not on file    Attends meetings of clubs or organizations: Not on file    Relationship status: Not on file  Other Topics Concern  . Not on file  Social History Narrative  . Not on file     Review of Systems: A 12 point ROS discussed and pertinent positives are indicated in the HPI above.  All other systems are negative.  Review of Systems  Constitutional: Negative for appetite change, chills, fatigue, fever and unexpected weight change.  Respiratory: Negative for cough and shortness of breath.   Cardiovascular: Negative for chest pain.  Gastrointestinal: Negative for abdominal pain, blood in stool, diarrhea, nausea and vomiting.  Genitourinary: Negative for dysuria and hematuria.  Musculoskeletal: Negative for back pain.  Skin: Negative for color change.  Neurological: Positive for headaches (occasional - minor). Negative for dizziness and syncope.  Psychiatric/Behavioral: Negative for confusion.    Vital Signs: BP (!) 165/97   Pulse 83   Temp 97.7 F (36.5 C) (Oral)   Resp 16   Ht 5\' 8"  (1.727 m)   Wt 220 lb (99.8 kg)   SpO2 98%   BMI 33.45 kg/m   Physical Exam Vitals signs reviewed.  Constitutional:      General: She is not in acute distress.    Comments: Pleasant, talkative, good historian. Son at bedside.  HENT:     Head: Normocephalic.  Cardiovascular:     Rate and Rhythm: Normal rate and regular rhythm.     Heart sounds: Murmur present.  Pulmonary:      Effort: Pulmonary effort is normal.     Breath sounds: Normal breath sounds.  Abdominal:     General: There is no distension.     Palpations: Abdomen is soft.     Tenderness: There is no abdominal tenderness.  Skin:    General: Skin is warm and dry.  Neurological:     Mental Status: She is alert and oriented to person, place, and time.  Psychiatric:        Mood and Affect: Mood  normal.        Behavior: Behavior normal.        Thought Content: Thought content normal.        Judgment: Judgment normal.      MD Evaluation Airway: WNL Heart: WNL Abdomen: WNL Chest/ Lungs: WNL ASA  Classification: 3 Mallampati/Airway Score: Two   Imaging: No results found.  Labs:  CBC: Recent Labs    09/19/17 0902 12/25/17 0844 02/05/18 0909 04/22/18 0914  WBC 7.4 12.6* 7.9 7.8  HGB 11.2* 11.0* 11.5* 11.0*  HCT 33.0* 33.1* 35.2* 34.8*  PLT 267 291 261 277    COAGS: Recent Labs    07/04/17 1201 04/22/18 0914  INR 0.97 1.0    BMP: Recent Labs    06/20/17 0916 09/19/17 0902 12/25/17 0844 02/05/18 0909  NA 138 140 142 143  K 3.7 3.8 4.7 4.1  CL 100 102 105 103  CO2 28 25 24 27   GLUCOSE 128 100* 133* 104*  BUN 11 24* 24* 25*  CALCIUM 10.2 9.8 10.0 9.7  CREATININE 0.99 1.09* 1.05* 1.06*  GFRNONAA 59* 52* 55* 55*  GFRAA >60 >60 >60 >60    LIVER FUNCTION TESTS: Recent Labs    06/20/17 0916 09/19/17 0902 12/25/17 0844 02/05/18 0909  BILITOT 0.3 0.3 0.3 0.2*  AST 21 14* 17 16  ALT 7 11 15 13   ALKPHOS 53 70 79 91  PROT 7.0 7.1 7.5 7.4  ALBUMIN 4.1 4.1 4.2 4.2    TUMOR MARKERS: No results for input(s): AFPTM, CEA, CA199, CHROMGRNA in the last 8760 hours.  Assessment and Plan:  68 y/o F with history of diffuse large B-cell lymphoma and known right renal mass followed by Dr. Alvy Bimler and Dr. Alyson Ingles. Most recent CT abd/pelvis from 12/2017 showed no lymphoma recurrence but enlarging right renal mass. Request has been made to IR for biopsy of this right  renal mass to further direct management.  Patient has been NPO since 7:30 last night except for a small sip of water this morning with her blood pressure medication, she does not take blood thinning medications at home. Afebrile, WBC 7.8, hgb 11.0, plt 277, INR 1.0.  Risks and benefits of right renal mass biopsy was discussed with the patient and/or patient's family including, but not limited to bleeding, infection, damage to adjacent structures or low yield requiring additional tests.  All of the questions were answered and there is agreement to proceed.  Consent signed and in chart.  Thank you for this interesting consult.  I greatly enjoyed meeting VALINE DROZDOWSKI and look forward to participating in their care.  A copy of this report was sent to the requesting provider on this date.  Electronically Signed: Joaquim Nam, PA-C 04/22/2018, 9:49 AM   I spent a total of  30 Minutes  in face to face in clinical consultation, greater than 50% of which was counseling/coordinating care for right renal mass biopsy.

## 2018-04-22 NOTE — Sedation Documentation (Signed)
Patient denies pain and is resting comfortably.  

## 2018-04-22 NOTE — Procedures (Signed)
Interventional Radiology Procedure:   Indications: Enlarging right renal mass  Procedure: Image guided right renal mass biopsy  Findings: 3 cores obtained.  Gelfoam slurry injected thru coaxial needle for hemostasis  Complications: None     EBL: Minimal  Plan: Strict bedrest for 3 hours, anticipate discharge to home later today.     Katrina [name] R. Anselm Pancoast, MD  Pager: [phone]

## 2018-04-29 ENCOUNTER — Ambulatory Visit: Payer: Medicare Other | Admitting: Urology

## 2018-04-29 DIAGNOSIS — C641 Malignant neoplasm of right kidney, except renal pelvis: Secondary | ICD-10-CM | POA: Diagnosis not present

## 2018-04-30 ENCOUNTER — Other Ambulatory Visit: Payer: Self-pay | Admitting: Urology

## 2018-05-05 ENCOUNTER — Telehealth: Payer: Self-pay

## 2018-05-05 NOTE — Telephone Encounter (Signed)
Gave Katrina [name] her appointment for 06-08-18 with Dr. Alvy Bimler. Lab at 0815 and visit at 0830. This r/s from 05-07-18 due to the Coronavirus. Pt verbalized understanding.

## 2018-05-07 ENCOUNTER — Ambulatory Visit: Payer: Medicare Other | Admitting: Hematology and Oncology

## 2018-05-07 ENCOUNTER — Other Ambulatory Visit: Payer: Medicare Other

## 2018-06-03 NOTE — Progress Notes (Signed)
Echo-epic-03/13/17 CT Chest- 12/27/17-epic

## 2018-06-08 ENCOUNTER — Inpatient Hospital Stay (HOSPITAL_COMMUNITY)
Admission: RE | Admit: 2018-06-08 | Discharge: 2018-06-08 | Disposition: A | Discharge status: Home / Self Care | Payer: Medicare Other | Source: Ambulatory Visit

## 2018-06-08 ENCOUNTER — Inpatient Hospital Stay: Payer: Medicare Other

## 2018-06-08 ENCOUNTER — Encounter (HOSPITAL_COMMUNITY): Admission: RE | Admit: 2018-06-08 | Payer: Medicare Other | Source: Ambulatory Visit

## 2018-06-08 ENCOUNTER — Ambulatory Visit: Payer: Medicare Other | Admitting: Hematology and Oncology

## 2018-06-08 NOTE — Patient Instructions (Signed)
Katrina [name]  06/08/2018   Your procedure is scheduled on: 06-11-18    Report to [facility] Main  Entrance    Report to Admitting at 10:30 AM    Call this number if you have problems the morning of surgery [phone]    Remember: Do not eat food or drink liquids :After Midnight.     BRUSH YOUR TEETH MORNING OF SURGERY AND RINSE YOUR MOUTH OUT, NO CHEWING GUM CANDY OR MINTS.     Take these medicines the morning of surgery with A SIP OF WATER: None. You may use and bring your eyedrops as needed.                                You may not have any metal on your body including hair pins and              piercings  Do not wear jewelry, make-up, lotions, powders or perfumes, deodorant             Do not wear nail polish.  Do not shave  48 hours prior to surgery.                Do not bring valuables to the hospital. [place].  Contacts, dentures or bridgework may not be worn into surgery.  Leave suitcase in the car. After surgery it may be brought to your room.     Patients discharged the day of surgery will not be allowed to drive home. IF YOU ARE HAVING SURGERY AND GOING HOME THE SAME DAY, YOU MUST HAVE AN ADULT TO DRIVE YOU HOME AND BE WITH YOU FOR 24 HOURS. YOU MAY GO HOME BY TAXI OR UBER OR ORTHERWISE, BUT AN ADULT MUST ACCOMPANY YOU HOME AND STAY WITH YOU FOR 24 HOURS.   Special Instructions: Please follow your surgeon's prep instructions and consume a Clear Liquid Diet along with your prep    CLEAR LIQUID DIET   Foods Allowed                                                                     Foods Excluded  Coffee and tea, regular and decaf                             liquids that you cannot  Plain Jell-O in any flavor                                             see through such as: Fruit ices (not with fruit pulp)                                     milk, soups, orange juice  Iced Popsicles  All solid food Carbonated beverages, regular and diet                                    Cranberry, grape and apple juices Sports drinks like Gatorade Lightly seasoned clear broth or consume(fat free) Sugar, honey syrup  Sample Menu Breakfast                                Lunch                                     Supper Cranberry juice                    Beef broth                            Chicken broth Jell-O                                     Grape juice                           Apple juice Coffee or tea                        Jell-O                                      Popsicle                                                Coffee or tea                        Coffee or tea               Please read over the following fact sheets you were given: _____________________________________________________________________            [facility] - Preparing for Surgery Before surgery, you can play an important role.  Because skin is not sterile, your skin needs to be as free of germs as possible.  You can reduce the number of germs on your skin by washing with CHG (chlorahexidine gluconate) soap before surgery.  CHG is an antiseptic cleaner which kills germs and bonds with the skin to continue killing germs even after washing. Please DO NOT use if you have an allergy to CHG or antibacterial soaps.  If your skin becomes reddened/irritated stop using the CHG and inform your nurse when you arrive at Short Stay. Do not shave (including legs and underarms) for at least 48 hours prior to the first CHG shower.  You may shave your face/neck. Please follow these instructions carefully:  1.  Shower with CHG Soap the night before surgery and the  morning of Surgery.  2.  If you choose to wash your hair, wash your hair first as usual with  your  normal  shampoo.  3.  After you shampoo, rinse your hair and body thoroughly to remove the  shampoo.                            4.  Use CHG as you would any other liquid soap.  You can apply chg directly  to the skin and wash                       Gently with a scrungie or clean washcloth.  5.  Apply the CHG Soap to your body ONLY FROM THE NECK DOWN.   Do not use on face/ open                           Wound or open sores. Avoid contact with eyes, ears mouth and genitals (private parts).                       Wash face,  Genitals (private parts) with your normal soap.             6.  Wash thoroughly, paying special attention to the area where your surgery  will be performed.  7.  Thoroughly rinse your body with warm water from the neck down.  8.  DO NOT shower/wash with your normal soap after using and rinsing off  the CHG Soap.                9.  Pat yourself dry with a clean towel.            10.  Wear clean pajamas.            11.  Place clean sheets on your bed the night of your first shower and do not  sleep with pets. Day of Surgery : Do not apply any lotions/deodorants the morning of surgery.  Please wear clean clothes to the hospital/surgery center.  FAILURE TO FOLLOW THESE INSTRUCTIONS MAY RESULT IN THE CANCELLATION OF YOUR SURGERY PATIENT SIGNATURE_________________________________  NURSE SIGNATURE__________________________________  ________________________________________________________________________

## 2018-06-09 ENCOUNTER — Other Ambulatory Visit: Payer: Self-pay

## 2018-06-09 ENCOUNTER — Encounter (HOSPITAL_COMMUNITY): Payer: Self-pay

## 2018-06-09 ENCOUNTER — Encounter (HOSPITAL_COMMUNITY)
Admission: RE | Admit: 2018-06-09 | Discharge: 2018-06-09 | Disposition: A | Discharge status: Home / Self Care | Payer: Medicare Other | Source: Ambulatory Visit | Attending: Urology | Admitting: Urology

## 2018-06-09 DIAGNOSIS — I10 Essential (primary) hypertension: Secondary | ICD-10-CM | POA: Insufficient documentation

## 2018-06-09 DIAGNOSIS — Z01818 Encounter for other preprocedural examination: Secondary | ICD-10-CM

## 2018-06-09 HISTORY — DX: Paralytic syndrome, unspecified: G83.9

## 2018-06-09 LAB — CBC
HCT: 35.4 % — ABNORMAL LOW (ref 36.0–46.0)
Hemoglobin: 11.4 g/dL — ABNORMAL LOW (ref 12.0–15.0)
MCH: 28.5 pg (ref 26.0–34.0)
MCHC: 32.2 g/dL (ref 30.0–36.0)
MCV: 88.5 fL (ref 80.0–100.0)
Platelets: 273 10*3/uL (ref 150–400)
RBC: 4 MIL/uL (ref 3.87–5.11)
RDW: 14.6 % (ref 11.5–15.5)
WBC: 8.6 10*3/uL (ref 4.0–10.5)
nRBC: 0 % (ref 0.0–0.2)

## 2018-06-09 LAB — BASIC METABOLIC PANEL
Anion gap: 10 (ref 5–15)
BUN: 23 mg/dL (ref 8–23)
CO2: 25 mmol/L (ref 22–32)
Calcium: 9.2 mg/dL (ref 8.9–10.3)
Chloride: 103 mmol/L (ref 98–111)
Creatinine, Ser: 1.04 mg/dL — ABNORMAL HIGH (ref 0.44–1.00)
GFR calc Af Amer: >60 mL/min (ref >60)
GFR calc non Af Amer: 56 mL/min — ABNORMAL LOW (ref >60)
Glucose, Bld: 110 mg/dL — ABNORMAL HIGH (ref 70–99)
Potassium: 3.4 mmol/L — ABNORMAL LOW (ref 3.5–5.1)
Sodium: 138 mmol/L (ref 135–145)

## 2018-06-10 NOTE — Anesthesia Preprocedure Evaluation (Addendum)
Anesthesia Evaluation  Patient identified by MRN, date of birth, ID band Patient awake    Reviewed: Allergy & Precautions, NPO status , Patient's Chart, lab work & pertinent test results  Airway Mallampati: I  TM Distance: >3 FB Neck ROM: Full    Dental  (+) Partial Upper   Pulmonary former smoker,  Pulmonary nodules   Pulmonary exam normal breath sounds clear to auscultation       Cardiovascular hypertension, Pt. on medications Normal cardiovascular exam Rhythm:Regular Rate:Normal  ECG: rate 81. Normal sinus rhythm Right bundle branch block Left anterior fascicular  ECHO: Systolic function was vigorous. The estimated ejection fraction was in the range of 65% to 70%.   Neuro/Psych Left sided paresthesia of lower extremity  negative psych ROS   GI/Hepatic negative GI ROS, Neg liver ROS,   Endo/Other  Non Hodgkin's lymphoma  Renal/GU negative Renal ROS     Musculoskeletal negative musculoskeletal ROS (+)   Abdominal (+) + obese,   Peds  Hematology  (+) anemia ,   Anesthesia Other Findings RIGHT RENAL CELL CARCINOMA  Reproductive/Obstetrics                           Anesthesia Physical Anesthesia Plan  ASA: III  Anesthesia Plan: General   Post-op Pain Management:    Induction: Intravenous  PONV Risk Score and Plan: 3 and Ondansetron, Dexamethasone, Midazolam and Treatment may vary due to age or medical condition  Airway Management Planned: Oral ETT  Additional Equipment:   Intra-op Plan:   Post-operative Plan: Extubation in OR  Informed Consent: I have reviewed the patients History and Physical, chart, labs and discussed the procedure including the risks, benefits and alternatives for the proposed anesthesia with the patient or authorized representative who has indicated his/her understanding and acceptance.     Dental advisory given  Plan Discussed with:  CRNA  Anesthesia Plan Comments: (Reviewed PAT note 06/09/18, Konrad Felix, PA-C)       Anesthesia Quick Evaluation

## 2018-06-10 NOTE — Progress Notes (Signed)
Anesthesia Chart Review   Case:  [id number] Date/Time:  06/11/18 1215   Procedure:  XI ROBOTIC ASSISTED LAPAROSCOPIC NEPHRECTOMY (Right ) - 2.5 HRS   Anesthesia type:  General   Pre-op diagnosis:  RIGHT RENAL CELL CARCINOMA   Location:  [facility] ROOM 03 / [facility]   Surgeon:  Cleon Gustin, MD      DISCUSSION: [phone] former smoker (15 pack years, quit 02/19/96) with h/o HTN, anemia, Non Hodgkin's lymphoma (remission), lymphadenopathy, bilateral lower extremity paraesthesia, right renal cell carcinoma scheduled for above procedure 06/11/18 with Dr. Nicolette Bang.    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  Echo 03/13/17 with EF 41-32%, grade 1 diastolic dysfunction, NO MR, AR, or AS.   Pt can proceed with planned procedure barring acute status change.  VS: BP (!) 148/87   Pulse 88   Temp 37.6 C (Oral)   Resp 20   Ht 5\' 8"  (1.727 m)   Wt 99.8 kg   SpO2 99%   BMI 33.45 kg/m   PROVIDERS: Lemmie Evens, MD is PCP   Heath Lark, MD with Hematology and Oncology   Danie Binder, MD is Gastroenterologist LABS: Labs reviewed: Acceptable for surgery. (all labs ordered are listed, but only abnormal results are displayed)  Labs Reviewed  BASIC METABOLIC PANEL - Abnormal; Notable for the following components:      Result Value   Potassium 3.4 (*)    Glucose, Bld 110 (*)    Creatinine, Ser 1.04 (*)    GFR calc non Af Amer 56 (*)    All other components within normal limits  CBC - Abnormal; Notable for the following components:   Hemoglobin 11.4 (*)    HCT 35.4 (*)    All other components within normal limits  TYPE AND SCREEN     IMAGES: CT Chest 12/25/2017 IMPRESSION: Chest Impression:  1. No evidence of lymphoma recurrence in thorax.  Abdomen / Pelvis Impression:  1. No evidence of lymphoma recurrence in the abdomen pelvis. 2. Stable hazy peritoneal fat surrounding the mesenteric vessels. 3. Stable ill-defined nodular density in the LEFT  retroperitoneum adjacent to the aorta. 4. Interval increase in size of RIGHT renal neoplasm  EKG: 06/09/2018 Rate 81 bpm Normal sinus rhythm Right bundle branch block Left anterior fascicular block Bifascicular block Moderate voltage criteria for LVH, may be normal variant Cannot rule out Septal infarct , age undetermined Abnormal ECG (Similar to previous tracings)  CV: Echo 03/13/17 Study Conclusions  - Procedure narrative: Transthoracic echocardiography. Image   quality was suboptimal. The study was technically difficult, as a   result of poor acoustic windows, poor sound wave transmission,   chest wall deformity, and body habitus. - Left ventricle: GLLS is normal at -19% The cavity size was   normal. There was mild focal basal hypertrophy of the septum.   Systolic function was vigorous. The estimated ejection fraction   was in the range of 65% to 70%. Wall motion was normal; there   were no regional wall motion abnormalities. There was an   increased relative contribution of atrial contraction to   ventricular filling. Doppler parameters are consistent with   abnormal left ventricular relaxation (grade 1 diastolic   dysfunction). - Pulmonary arteries: Systolic pressure could not be accurately   estimated. Past Medical History:  Diagnosis Date  . Anemia   . Arthritis   . 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 Hypertension   . Lymphadenopathy 11/2016   "  extensive"  . Non Hodgkin's lymphoma ([place]) 2019  . 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 Paresthesia of lower extremity 11/2016   bilateral  . Pulmonary nodules 11/2016  . Right renal mass 11/2016    Past Surgical History:  Procedure Laterality Date  . ABDOMINAL HYSTERECTOMY    . BREAST LUMPECTOMY     left  . COLONOSCOPY N/A 11/02/2013   Procedure: COLONOSCOPY;  Surgeon: Danie Binder, MD;  [facility];  Service: Endoscopy;  Laterality: N/A;  1:15 - moved to [place] notified pt  . COLONOSCOPY N/A 04/17/2018    Procedure: COLONOSCOPY;  Surgeon: Danie Binder, MD;  [facility];  Service: Endoscopy;  Laterality: N/A;  12:00  . IR FLUORO GUIDE PORT INSERTION RIGHT  12/30/2016  . IR REMOVAL TUN ACCESS W/ PORT W/O FL MOD SED  07/04/2017  . IR US GUIDE VASC ACCESS RIGHT  12/30/2016  . LAMINECTOMY N/A 12/18/2016   Procedure: DECOMPRESSION T5-8/THORACIC;  Surgeon: Kary Kos, MD;  Location: [place];  Service: Neurosurgery;  Laterality: N/A;  Decompressive Thoracic Laminectomy, Thoracic five - six, Thoraic six - seven, Thoracic seven - eight  . POLYPECTOMY  04/17/2018   Procedure: POLYPECTOMY;  Surgeon: Danie Binder, MD;  [facility];  Service: Endoscopy;;  splenic flexure  . SHOULDER SURGERY     right    MEDICATIONS: . bisacodyl (DULCOLAX) 5 MG EC tablet  . chlorpheniramine (CHLOR-TRIMETON) 4 MG tablet  . Cholecalciferol (VITAMIN D3) 50 MCG (2000 UT) TABS  . cyclobenzaprine (FLEXERIL) 10 MG tablet  . hydroxypropyl methylcellulose / hypromellose (ISOPTO TEARS / GONIOVISC) 2.5 % ophthalmic solution  . naproxen sodium (ALEVE) 220 MG tablet  . triamterene-hydrochlorothiazide (DYAZIDE) 37.5-25 MG capsule  . vitamin B-12 (CYANOCOBALAMIN) 1000 MCG tablet   No current facility-administered medications for this encounter.    Maia Plan [facility] Pre-Surgical Testing [phone] 06/10/18 1:01 PM

## 2018-06-11 ENCOUNTER — Other Ambulatory Visit: Payer: Self-pay

## 2018-06-11 ENCOUNTER — Encounter (HOSPITAL_COMMUNITY)
Admission: RE | Disposition: A | Discharge status: Home / Self Care | Payer: Self-pay | Source: Home / Self Care | Attending: Urology

## 2018-06-11 ENCOUNTER — Encounter (HOSPITAL_COMMUNITY): Payer: Self-pay | Admitting: Certified Registered Nurse Anesthetist

## 2018-06-11 ENCOUNTER — Inpatient Hospital Stay (HOSPITAL_COMMUNITY)
Admission: RE | Admit: 2018-06-11 | Discharge: 2018-06-14 | DRG: 657 | Disposition: A | Discharge status: Home / Self Care | Payer: Medicare Other | Attending: Urology | Admitting: Urology

## 2018-06-11 ENCOUNTER — Inpatient Hospital Stay (HOSPITAL_COMMUNITY): Payer: Medicare Other | Admitting: Certified Registered Nurse Anesthetist

## 2018-06-11 ENCOUNTER — Inpatient Hospital Stay (HOSPITAL_COMMUNITY): Payer: Medicare Other | Admitting: Physician Assistant

## 2018-06-11 DIAGNOSIS — Z79899 Other long term (current) drug therapy: Secondary | ICD-10-CM | POA: Diagnosis not present

## 2018-06-11 DIAGNOSIS — R918 Other nonspecific abnormal finding of lung field: Secondary | ICD-10-CM | POA: Diagnosis present

## 2018-06-11 DIAGNOSIS — Y838 Other surgical procedures as the cause of abnormal reaction of the patient, or of later complication, without mention of misadventure at the time of the procedure: Secondary | ICD-10-CM | POA: Diagnosis not present

## 2018-06-11 DIAGNOSIS — K9171 Accidental puncture and laceration of a digestive system organ or structure during a digestive system procedure: Secondary | ICD-10-CM | POA: Diagnosis not present

## 2018-06-11 DIAGNOSIS — N2889 Other specified disorders of kidney and ureter: Secondary | ICD-10-CM | POA: Diagnosis present

## 2018-06-11 DIAGNOSIS — Z8572 Personal history of non-Hodgkin lymphomas: Secondary | ICD-10-CM | POA: Diagnosis not present

## 2018-06-11 DIAGNOSIS — Z91041 Radiographic dye allergy status: Secondary | ICD-10-CM | POA: Diagnosis not present

## 2018-06-11 DIAGNOSIS — Z87891 Personal history of nicotine dependence: Secondary | ICD-10-CM

## 2018-06-11 DIAGNOSIS — I10 Essential (primary) hypertension: Secondary | ICD-10-CM | POA: Diagnosis present

## 2018-06-11 DIAGNOSIS — I4510 Unspecified right bundle-branch block: Secondary | ICD-10-CM | POA: Diagnosis present

## 2018-06-11 DIAGNOSIS — C641 Malignant neoplasm of right kidney, except renal pelvis: Principal | ICD-10-CM | POA: Diagnosis present

## 2018-06-11 DIAGNOSIS — Z90710 Acquired absence of both cervix and uterus: Secondary | ICD-10-CM | POA: Diagnosis not present

## 2018-06-11 HISTORY — PX: ROBOT ASSISTED LAPAROSCOPIC NEPHRECTOMY: SHX5140

## 2018-06-11 LAB — HEMOGLOBIN AND HEMATOCRIT, BLOOD
HCT: 29.8 % — ABNORMAL LOW (ref 36.0–46.0)
Hemoglobin: 9.4 g/dL — ABNORMAL LOW (ref 12.0–15.0)

## 2018-06-11 SURGERY — NEPHRECTOMY, RADICAL, ROBOT-ASSISTED, LAPAROSCOPIC, ADULT
Anesthesia: General | Laterality: Right

## 2018-06-11 MED ORDER — CEFAZOLIN SODIUM-DEXTROSE 2-4 GM/100ML-% IV SOLN
2.0000 g | INTRAVENOUS | Status: AC
  Administered 2018-06-11: 2 g via INTRAVENOUS
  Filled 2018-06-11: qty 100

## 2018-06-11 MED ORDER — DIPHENHYDRAMINE HCL 12.5 MG/5ML PO ELIX: 12.5000 mg | ORAL_SOLUTION | Freq: Four times a day (QID) | ORAL | Status: DC | PRN

## 2018-06-11 MED ORDER — OXYCODONE HCL 5 MG PO TABS
5.0000 mg | ORAL_TABLET | ORAL | Status: DC | PRN
  Administered 2018-06-11 – 2018-06-14 (×10): 5 mg via ORAL
  Filled 2018-06-11 (×10): qty 1

## 2018-06-11 MED ORDER — PROPOFOL 10 MG/ML IV BOLUS
INTRAVENOUS | Status: AC
  Filled 2018-06-11: qty 20

## 2018-06-11 MED ORDER — ONDANSETRON HCL 4 MG/2ML IJ SOLN
INTRAMUSCULAR | Status: AC
  Filled 2018-06-11: qty 2

## 2018-06-11 MED ORDER — SUCCINYLCHOLINE CHLORIDE 200 MG/10ML IV SOSY
PREFILLED_SYRINGE | INTRAVENOUS | Status: AC
  Filled 2018-06-11: qty 10

## 2018-06-11 MED ORDER — STERILE WATER FOR IRRIGATION IR SOLN
Status: DC | PRN
  Administered 2018-06-11: 1000 mL

## 2018-06-11 MED ORDER — DEXTROSE-NACL 5-0.45 % IV SOLN
INTRAVENOUS | Status: DC
  Administered 2018-06-11 – 2018-06-13 (×4): via INTRAVENOUS

## 2018-06-11 MED ORDER — BISACODYL 5 MG PO TBEC
15.0000 mg | DELAYED_RELEASE_TABLET | Freq: Every day | ORAL | Status: DC
  Administered 2018-06-12 – 2018-06-14 (×3): 15 mg via ORAL
  Filled 2018-06-11 (×3): qty 3

## 2018-06-11 MED ORDER — SODIUM CHLORIDE (PF) 0.9 % IJ SOLN
INTRAMUSCULAR | Status: AC
  Filled 2018-06-11: qty 20

## 2018-06-11 MED ORDER — SUGAMMADEX SODIUM 200 MG/2ML IV SOLN
INTRAVENOUS | Status: DC | PRN
  Administered 2018-06-11: 400 mg via INTRAVENOUS

## 2018-06-11 MED ORDER — ROCURONIUM BROMIDE 10 MG/ML (PF) SYRINGE
PREFILLED_SYRINGE | INTRAVENOUS | Status: AC
  Filled 2018-06-11: qty 10

## 2018-06-11 MED ORDER — FENTANYL CITRATE (PF) 100 MCG/2ML IJ SOLN
INTRAMUSCULAR | Status: AC
  Filled 2018-06-11: qty 4

## 2018-06-11 MED ORDER — HYDROMORPHONE HCL 1 MG/ML IJ SOLN
0.2500 mg | INTRAMUSCULAR | Status: DC | PRN
  Administered 2018-06-11 (×4): 0.5 mg via INTRAVENOUS

## 2018-06-11 MED ORDER — BUPIVACAINE LIPOSOME 1.3 % IJ SUSP
20.0000 mL | Freq: Once | INTRAMUSCULAR | Status: AC
  Administered 2018-06-11: 20 mL
  Filled 2018-06-11: qty 20

## 2018-06-11 MED ORDER — DEXAMETHASONE SODIUM PHOSPHATE 10 MG/ML IJ SOLN
INTRAMUSCULAR | Status: DC | PRN
  Administered 2018-06-11: 10 mg via INTRAVENOUS

## 2018-06-11 MED ORDER — BELLADONNA ALKALOIDS-OPIUM 16.2-60 MG RE SUPP: 1.0000 | Freq: Four times a day (QID) | RECTAL | Status: DC | PRN

## 2018-06-11 MED ORDER — SODIUM CHLORIDE (PF) 0.9 % IJ SOLN
INTRAMUSCULAR | Status: DC | PRN
  Administered 2018-06-11: 10 mL

## 2018-06-11 MED ORDER — LACTATED RINGERS IR SOLN
Status: DC | PRN
  Administered 2018-06-11: 1

## 2018-06-11 MED ORDER — LIDOCAINE 2% (20 MG/ML) 5 ML SYRINGE
INTRAMUSCULAR | Status: AC
  Filled 2018-06-11: qty 5

## 2018-06-11 MED ORDER — DIPHENHYDRAMINE HCL 50 MG/ML IJ SOLN: 12.5000 mg | Freq: Four times a day (QID) | INTRAMUSCULAR | Status: DC | PRN

## 2018-06-11 MED ORDER — HYDROMORPHONE HCL 1 MG/ML IJ SOLN
0.5000 mg | INTRAMUSCULAR | Status: DC | PRN
  Administered 2018-06-12 (×2): 1 mg via INTRAVENOUS
  Administered 2018-06-13: 04:00:00 0.5 mg via INTRAVENOUS
  Filled 2018-06-11 (×3): qty 1

## 2018-06-11 MED ORDER — SUCCINYLCHOLINE CHLORIDE 200 MG/10ML IV SOSY
PREFILLED_SYRINGE | INTRAVENOUS | Status: DC | PRN
  Administered 2018-06-11: 120 mg via INTRAVENOUS

## 2018-06-11 MED ORDER — ONDANSETRON HCL 4 MG/2ML IJ SOLN
INTRAMUSCULAR | Status: DC | PRN
  Administered 2018-06-11: 4 mg via INTRAVENOUS

## 2018-06-11 MED ORDER — FENTANYL CITRATE (PF) 100 MCG/2ML IJ SOLN
25.0000 ug | INTRAMUSCULAR | Status: DC | PRN
  Administered 2018-06-11 (×3): 50 ug via INTRAVENOUS

## 2018-06-11 MED ORDER — HYPROMELLOSE (GONIOSCOPIC) 2.5 % OP SOLN: 1.0000 [drp] | Freq: Three times a day (TID) | OPHTHALMIC | Status: DC | PRN

## 2018-06-11 MED ORDER — DEXAMETHASONE SODIUM PHOSPHATE 10 MG/ML IJ SOLN
INTRAMUSCULAR | Status: AC
  Filled 2018-06-11: qty 1

## 2018-06-11 MED ORDER — ROCURONIUM BROMIDE 10 MG/ML (PF) SYRINGE
PREFILLED_SYRINGE | INTRAVENOUS | Status: DC | PRN
  Administered 2018-06-11: 20 mg via INTRAVENOUS
  Administered 2018-06-11: 50 mg via INTRAVENOUS
  Administered 2018-06-11: 10 mg via INTRAVENOUS
  Administered 2018-06-11: 20 mg via INTRAVENOUS

## 2018-06-11 MED ORDER — MIDAZOLAM HCL 5 MG/5ML IJ SOLN
INTRAMUSCULAR | Status: DC | PRN
  Administered 2018-06-11: 2 mg via INTRAVENOUS

## 2018-06-11 MED ORDER — SUGAMMADEX SODIUM 500 MG/5ML IV SOLN
INTRAVENOUS | Status: AC
  Filled 2018-06-11: qty 5

## 2018-06-11 MED ORDER — HYDROMORPHONE HCL 1 MG/ML IJ SOLN
INTRAMUSCULAR | Status: AC
  Filled 2018-06-11: qty 1

## 2018-06-11 MED ORDER — LACTATED RINGERS IV SOLN
INTRAVENOUS | Status: DC | PRN
  Administered 2018-06-11: 13:00:00 via INTRAVENOUS

## 2018-06-11 MED ORDER — FENTANYL CITRATE (PF) 250 MCG/5ML IJ SOLN
INTRAMUSCULAR | Status: AC
  Filled 2018-06-11: qty 5

## 2018-06-11 MED ORDER — ACETAMINOPHEN 500 MG PO TABS
1000.0000 mg | ORAL_TABLET | Freq: Once | ORAL | Status: AC
  Administered 2018-06-11: 10:00:00 1000 mg via ORAL
  Filled 2018-06-11: qty 2

## 2018-06-11 MED ORDER — HYDROCODONE-ACETAMINOPHEN 5-325 MG PO TABS: 1.0000 | ORAL_TABLET | Freq: Four times a day (QID) | ORAL | 0 refills | Status: DC | PRN

## 2018-06-11 MED ORDER — PHENYLEPHRINE HCL-NACL 10-0.9 MG/250ML-% IV SOLN
INTRAVENOUS | Status: AC
  Filled 2018-06-11: qty 250

## 2018-06-11 MED ORDER — ONDANSETRON HCL 4 MG/2ML IJ SOLN
4.0000 mg | INTRAMUSCULAR | Status: DC | PRN
  Administered 2018-06-13: 4 mg via INTRAVENOUS
  Filled 2018-06-11: qty 2

## 2018-06-11 MED ORDER — ONDANSETRON HCL 4 MG/2ML IJ SOLN
4.0000 mg | Freq: Once | INTRAMUSCULAR | Status: AC | PRN
  Administered 2018-06-11: 4 mg via INTRAVENOUS

## 2018-06-11 MED ORDER — HEMOSTATIC AGENTS (NO CHARGE) OPTIME
TOPICAL | Status: DC | PRN
  Administered 2018-06-11: 1 via TOPICAL

## 2018-06-11 MED ORDER — LACTATED RINGERS IV SOLN
INTRAVENOUS | Status: DC
  Administered 2018-06-11 (×2): via INTRAVENOUS

## 2018-06-11 MED ORDER — FENTANYL CITRATE (PF) 250 MCG/5ML IJ SOLN
INTRAMUSCULAR | Status: DC | PRN
  Administered 2018-06-11 (×3): 50 ug via INTRAVENOUS
  Administered 2018-06-11: 100 ug via INTRAVENOUS

## 2018-06-11 MED ORDER — PROPOFOL 10 MG/ML IV BOLUS
INTRAVENOUS | Status: DC | PRN
  Administered 2018-06-11: 150 mg via INTRAVENOUS

## 2018-06-11 MED ORDER — ACETAMINOPHEN 325 MG PO TABS
650.0000 mg | ORAL_TABLET | ORAL | Status: DC | PRN
  Administered 2018-06-13 – 2018-06-14 (×2): 650 mg via ORAL
  Filled 2018-06-11 (×2): qty 2

## 2018-06-11 MED ORDER — MAGNESIUM CITRATE PO SOLN: 1.0000 | Freq: Once | ORAL | Status: DC

## 2018-06-11 SURGICAL SUPPLY — 69 items
ADH SKN CLS APL DERMABOND .7 (GAUZE/BANDAGES/DRESSINGS) ×1
AGENT HMST MTR 8 SURGIFLO (HEMOSTASIS) ×1
APL ESCP 34 STRL LF DISP (HEMOSTASIS) ×1
APL PRP STRL LF DISP 70% ISPRP (MISCELLANEOUS) ×1
APPLICATOR SURGIFLO ENDO (HEMOSTASIS) ×2 IMPLANT
BAG LAPAROSCOPIC 12 15 PORT 16 (BASKET) IMPLANT
BAG RETRIEVAL 12/15 (BASKET)
BAG RETRIEVAL 12/15MM (BASKET)
CHLORAPREP W/TINT 26 (MISCELLANEOUS) ×3 IMPLANT
CLIP VESOLOCK LG 6/CT PURPLE (CLIP) ×3 IMPLANT
CLIP VESOLOCK MED LG 6/CT (CLIP) ×3 IMPLANT
CLIP VESOLOCK XL 6/CT (CLIP) ×5 IMPLANT
COVER SURGICAL LIGHT HANDLE (MISCELLANEOUS) ×3 IMPLANT
COVER TIP SHEARS 8 DVNC (MISCELLANEOUS) ×1 IMPLANT
COVER TIP SHEARS 8MM DA VINCI (MISCELLANEOUS) ×2
COVER WAND RF STERILE (DRAPES) ×2 IMPLANT
CUTTER ECHEON FLEX ENDO 45 340 (ENDOMECHANICALS) ×3 IMPLANT
DECANTER SPIKE VIAL GLASS SM (MISCELLANEOUS) ×3 IMPLANT
DERMABOND ADVANCED (GAUZE/BANDAGES/DRESSINGS) ×2
DERMABOND ADVANCED .7 DNX12 (GAUZE/BANDAGES/DRESSINGS) ×2 IMPLANT
DRAIN CHANNEL 10F 3/8 F FF (DRAIN) ×3 IMPLANT
DRAPE ARM DVNC X/XI (DISPOSABLE) ×4 IMPLANT
DRAPE COLUMN DVNC XI (DISPOSABLE) ×1 IMPLANT
DRAPE DA VINCI XI ARM (DISPOSABLE) ×8
DRAPE DA VINCI XI COLUMN (DISPOSABLE) ×2
DRAPE INCISE IOBAN 66X45 STRL (DRAPES) ×3 IMPLANT
DRAPE SHEET LG 3/4 BI-LAMINATE (DRAPES) ×3 IMPLANT
DRSG TEGADERM 4X4.75 (GAUZE/BANDAGES/DRESSINGS) ×2 IMPLANT
ELECT PENCIL ROCKER SW 15FT (MISCELLANEOUS) ×3 IMPLANT
ELECT REM PT RETURN 15FT ADLT (MISCELLANEOUS) ×6 IMPLANT
EVACUATOR SILICONE 100CC (DRAIN) ×3 IMPLANT
GAUZE SPONGE 4X4 12PLY STRL (GAUZE/BANDAGES/DRESSINGS) ×2 IMPLANT
GLOVE BIO SURGEON STRL SZ 6.5 (GLOVE) ×2 IMPLANT
GLOVE BIO SURGEON STRL SZ8 (GLOVE) ×6 IMPLANT
GLOVE BIO SURGEONS STRL SZ 6.5 (GLOVE) ×1
GLOVE BIOGEL PI IND STRL 8 (GLOVE) ×1 IMPLANT
GLOVE BIOGEL PI INDICATOR 8 (GLOVE) ×2
GOWN STRL REUS W/TWL LRG LVL3 (GOWN DISPOSABLE) ×9 IMPLANT
HEMOSTAT SURGICEL 4X8 (HEMOSTASIS) ×9 IMPLANT
IRRIG SUCT STRYKERFLOW 2 WTIP (MISCELLANEOUS) ×3
IRRIGATION SUCT STRKRFLW 2 WTP (MISCELLANEOUS) ×1 IMPLANT
KIT BASIN OR (CUSTOM PROCEDURE TRAY) ×3 IMPLANT
KIT TURNOVER KIT A (KITS) ×2 IMPLANT
LOOP VESSEL MAXI BLUE (MISCELLANEOUS) IMPLANT
NDL SAFETY ECLIPSE 18X1.5 (NEEDLE) IMPLANT
NEEDLE HYPO 18GX1.5 SHARP (NEEDLE) ×3
NEEDLE INSUFFLATION 14GA 120MM (NEEDLE) ×3 IMPLANT
PROTECTOR NERVE ULNAR (MISCELLANEOUS) ×6 IMPLANT
RELOAD STAPLE 45 2.6 WHT THIN (STAPLE) IMPLANT
SEAL CANN UNIV 5-8 DVNC XI (MISCELLANEOUS) ×4 IMPLANT
SEAL XI 5MM-8MM UNIVERSAL (MISCELLANEOUS) ×8
SOLUTION ELECTROLUBE (MISCELLANEOUS) ×3 IMPLANT
SPOGE SURGIFLO 8M (HEMOSTASIS) ×2
SPONGE DRAIN TRACH 4X4 STRL 2S (GAUZE/BANDAGES/DRESSINGS) ×2 IMPLANT
SPONGE LAP 4X18 RFD (DISPOSABLE) IMPLANT
SPONGE SURGIFLO 8M (HEMOSTASIS) IMPLANT
STAPLE RELOAD 45 WHT (STAPLE) ×1 IMPLANT
STAPLE RELOAD 45MM WHITE (STAPLE) ×3
SUT ETHILON 3 0 PS 1 (SUTURE) ×3 IMPLANT
SUT MNCRL AB 4-0 PS2 18 (SUTURE) ×6 IMPLANT
SUT PDS AB 1 TP1 96 (SUTURE) ×3 IMPLANT
SUT VICRYL 0 UR6 27IN ABS (SUTURE) ×6 IMPLANT
TAPE CLOTH 4X10 WHT NS (GAUZE/BANDAGES/DRESSINGS) ×3 IMPLANT
TOWEL OR NON WOVEN STRL DISP B (DISPOSABLE) ×3 IMPLANT
TRAY FOLEY MTR SLVR 16FR STAT (SET/KITS/TRAYS/PACK) ×2 IMPLANT
TRAY LAPAROSCOPIC (CUSTOM PROCEDURE TRAY) ×3 IMPLANT
TROCAR BLADELESS OPT 5 100 (ENDOMECHANICALS) ×3 IMPLANT
TROCAR XCEL 12X100 BLDLESS (ENDOMECHANICALS) ×3 IMPLANT
WATER STERILE IRR 1000ML POUR (IV SOLUTION) ×6 IMPLANT

## 2018-06-11 NOTE — Op Note (Signed)
Preoperative diagnosis: Right renal mass  Postop diagnosis: Same  Procedure: 1.  right robot assisted laparoscopic radical nephrectomy  Attending: Nicolette Bang  Assistant: Debbrah Alar, PA  Anesthesia: General  Estimated blood loss: 300 cc  Drains: 87 French Foley catheter JP drain in right lower quadrant  Specimens: right radical nephrectomy  Antibiotics: ancef  Findings: 1 renal artery and 1 renal vein. 2cm liver laceration requiring surgicel and surgiflo. The assistant was utilized for retraction, suction, passing instruments, and deploying the stapler cross the renal vein.  Indications: Patient is a 69 year old with a history of 4 cm right renal mass.  The mass was not amenable to partial nephrectomy.  After discussing treatment options patient decided to proceed with right robot assisted laparoscopic radical nephrectomy.  Procedure in detail: Prior to procedure consent was obtained. Patient was brought to the operating room and briefing was done sure correct patient, correct procedure, correct site.  General anesthesia was in administered patient was placed in the left lateral decubitus position.  a 48 French catheter was placed. their abdomen and flank was then prepped and draped usual sterile fashion.  A Veress needle was used to obtain pneumoperitoneum.  Once pneumoperitoneum was reestablished to 15 mmHg we then placed a 8 mm camera port lateral to the umbilicus at the lateral edge of rectus.  We then proceeded to place 3 more robotic ports. We then placed an assistant port inferior to the camera in the midline. We then placed a 76mm port for the liver retractor in the midline above the assistant port. We then docked the robot.  We then started this dissection by removing a extensive amount of anterior abdominal wall adhesions and adhesions to the liver.  We then dissected along the white line of Toldt.  We then reflected the colon medially.  We then proceeded to kocherize the  duodenum. We then identified the psoas muscle.  Once this was done we traced it down to the iliac vessels and identified the ureter.  Once we identified the gonadal vein and ureter were then traced this to the renal hilum.  The renal vein and renal artery were skeletonized.  We did we identified one renal vein one renal artery.  Using hemolock clips we ligated the renal artery. Using the [employer] power stapler we ligated the renal vein.  We then used a hem-o-lock clips to ligate the gonadal vein and the ureter.  Once this was done we then freed the kidney from its lateral and posterior attachments.  We then used a Endo Catch bag to remove the specimen.  Once the specimen was in the Endo Catch bag we then inspected the retroperitoneum and noted no residual bleeding.  We then placed a JP drain through the lateral right lower quadrant robot port. We then secured it to the skin with a 2-0 nylon.We then removed our instruments, undocked the robot, and released the pneumoperitoneum.  We then made a midline incision to remove the specimen.  Once the specimen was removed we then closed the camera and assistant ports with 0 Vicryl in interrupted fashion.  We then closed the  Right lower quadrant incision with 0 PDS in a running fashion.  We then closed the overlying skin with 2-0 Vicryl in running fashion.  The skin was then closed with staples.  This then concluded the procedure which was well tolerated by the patient.  Complications: None  Condition: Stable, x-rayed, transferred to PACU.  Plan: Patient is to be admitted for inpatient stay.  She will remain on bedrest for 2 days. They will be started on a clear liquid diet POD#1

## 2018-06-11 NOTE — Anesthesia Postprocedure Evaluation (Signed)
Anesthesia Post Note  Patient: Katrina [name]  Procedure(s) Performed: XI ROBOTIC ASSISTED LAPAROSCOPIC NEPHRECTOMY (Right )     Patient location during evaluation: PACU Anesthesia Type: General Level of consciousness: awake and alert Pain management: pain level controlled Vital Signs Assessment: post-procedure vital signs reviewed and stable Respiratory status: spontaneous breathing, nonlabored ventilation, respiratory function stable and patient connected to nasal cannula oxygen Cardiovascular status: blood pressure returned to baseline and stable Postop Assessment: no apparent nausea or vomiting Anesthetic complications: no    Last Vitals:  Vitals:   06/11/18 1645 06/11/18 1700  BP: (!) 153/85 (!) 150/83  Pulse: 71 72  Resp: 12 15  Temp:  36.7 C  SpO2: 100% 100%    Last Pain:  Vitals:   06/11/18 1700  TempSrc:   PainSc: 3                  Ryan P Ellender

## 2018-06-11 NOTE — Anesthesia Procedure Notes (Signed)
Procedure Name: Intubation Date/Time: 06/11/2018 12:50 PM Performed by: Mitzie Na, CRNA Pre-anesthesia Checklist: Patient identified, Emergency Drugs available, Suction available, Patient being monitored and Timeout performed Patient Re-evaluated:Patient Re-evaluated prior to induction Oxygen Delivery Method: Circle system utilized Preoxygenation: Pre-oxygenation with 100% oxygen Induction Type: IV induction and Rapid sequence Laryngoscope Size: Mac and 3 Grade View: Grade I Tube type: Oral Tube size: 7.5 mm Number of attempts: 1 Airway Equipment and Method: Stylet Placement Confirmation: ETT inserted through vocal cords under direct vision,  positive ETCO2 and breath sounds checked- equal and bilateral Secured at: 24 cm Tube secured with: Tape Dental Injury: Teeth and Oropharynx as per pre-operative assessment

## 2018-06-11 NOTE — Discharge Instructions (Signed)

## 2018-06-11 NOTE — H&P (Signed)
Urology Admission H&P  Chief Complaint: right renal mass  History of Present Illness: Katrina [name] is a 68yo witrh a history of lymphoma who was found to have an enlarging right renal mass on CT. 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 She denies any hematuria. No LUTS. No nausea/vomiting  Past Medical History:  Diagnosis Date  . Anemia   . Arthritis   . 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 Hypertension   . Lymphadenopathy 11/2016   "extensive"  . Non Hodgkin's lymphoma ([place]) 2019  . Paralysis ([place])    12-13-2016  . Paresthesia of lower extremity 11/2016   bilateral  . Pulmonary nodules 11/2016  . Right renal mass 11/2016   Past Surgical History:  Procedure Laterality Date  . ABDOMINAL HYSTERECTOMY    . BREAST LUMPECTOMY     left  . COLONOSCOPY N/A 11/02/2013   Procedure: COLONOSCOPY;  Surgeon: Danie Binder, MD;  [facility];  Service: Endoscopy;  Laterality: N/A;  1:15 - moved to [place] notified pt  . COLONOSCOPY N/A 04/17/2018   Procedure: COLONOSCOPY;  Surgeon: Danie Binder, MD;  [facility];  Service: Endoscopy;  Laterality: N/A;  12:00  . IR FLUORO GUIDE PORT INSERTION RIGHT  12/30/2016  . IR REMOVAL TUN ACCESS W/ PORT W/O FL MOD SED  07/04/2017  . IR US GUIDE VASC ACCESS RIGHT  12/30/2016  . LAMINECTOMY N/A 12/18/2016   Procedure: DECOMPRESSION T5-8/THORACIC;  Surgeon: Kary Kos, MD;  Location: [place];  Service: Neurosurgery;  Laterality: N/A;  Decompressive Thoracic Laminectomy, Thoracic five - six, Thoraic six - seven, Thoracic seven - eight  . POLYPECTOMY  04/17/2018   Procedure: POLYPECTOMY;  Surgeon: Danie Binder, MD;  [facility];  Service: Endoscopy;;  splenic flexure  . SHOULDER SURGERY     right    Home Medications:  Current Facility-Administered Medications  Medication Dose Route Frequency Provider Last Rate Last Dose  . bupivacaine liposome (EXPAREL) 1.3 % injection 266 mg  20 mL Infiltration Once Cleon Gustin, MD      .  ceFAZolin (ANCEF) IVPB 2g/100 mL premix  2 g Intravenous 30 min Pre-Op [place] Emmitt, Candee Furbish, MD      . lactated ringers infusion   Intravenous Continuous Ellender, Karyl Kinnier, MD 20 mL/hr at 06/11/18 1029    . phenylephrine (NEOSYNEPHRINE) 10-0.9 MG/250ML-% infusion            Allergies:  Allergies  Allergen Reactions  . Contrast Media [Iodinated Diagnostic Agents] Hives and Itching    Family History  Problem Relation Age of Onset  . Colon cancer Sister        at age 3  . Colon cancer Brother        at age 15  . COPD Mother   . Diabetes Mellitus II Mother   . CAD Mother        Angina  . Colon cancer Father   . Thyroid disease Sister   . Diabetes Mellitus II Sister    Social History:  reports that she quit smoking about 22 years ago. Her smoking use included cigarettes. She has a 15.00 pack-year smoking history. She has never used smokeless tobacco. She reports that she does not drink alcohol or use drugs.  Review of Systems  All other systems reviewed and are negative.   Physical Exam:  Vital signs in last 24 hours: Temp:  [98.2 F (36.8 C)] 98.2 F (36.8 C) (04/23 1005)  Pulse Rate:  [84] 84 (04/23 1005) Resp:  [18] 18 (04/23 1005) BP: (161)/(92) 161/92 (04/23 1005) SpO2:  [100 %] 100 % (04/23 1005) Weight:  [99.8 kg] 99.8 kg (04/23 1028) Physical Exam  Constitutional: She is oriented to person, place, and time. She appears well-developed and well-nourished.  HENT:  Head: Normocephalic and atraumatic.  Eyes: Pupils are equal, round, and reactive to light. EOM are normal.  Neck: Normal range of motion. No thyromegaly present.  Cardiovascular: Normal rate and regular rhythm.  Respiratory: Effort normal. No respiratory distress.  GI: Soft. She exhibits no distension.  Musculoskeletal: Normal range of motion.        General: No edema.  Neurological: She is alert and oriented to person, place, and time.  Skin: Skin is warm and dry.  Psychiatric: She has a normal mood and  affect. Her behavior is normal. Judgment and thought content normal.    Laboratory Data:  No results found for this or any previous visit (from the past 24 hour(s)). No results found for this or any previous visit (from the past 240 hour(s)). Creatinine: Recent Labs    06/09/18 0949  CREATININE 1.04*   Baseline Creatinine: 1  Impression/Assessment:  67yo with right RCC  Plan:  The risks/benefits/alternatives to right robot assisted laparoscopic radical nephrectomy was explained to the patient and she understands and wishes to proceed with surgery  Katrina [name] 06/11/2018, 12:30 PM

## 2018-06-11 NOTE — Transfer of Care (Signed)
Immediate Anesthesia Transfer of Care Note  Patient: Katrina [name]  Procedure(s) Performed: XI ROBOTIC ASSISTED LAPAROSCOPIC NEPHRECTOMY (Right )  Patient Location: PACU  Anesthesia Type:General  Level of Consciousness: awake, alert , oriented and patient cooperative  Airway & Oxygen Therapy: Patient Spontanous Breathing and Patient connected to face mask oxygen  Post-op Assessment: Report given to RN, Post -op Vital signs reviewed and stable and Patient moving all extremities  Post vital signs: Reviewed and stable  Last Vitals:  Vitals Value Taken Time  BP 133/73 06/11/2018  3:54 PM  Temp    Pulse 80 06/11/2018  3:58 PM  Resp 19 06/11/2018  3:58 PM  SpO2 100 % 06/11/2018  3:58 PM  Vitals shown include unvalidated device data.  Last Pain:  Vitals:   06/11/18 1005  TempSrc: Oral         Complications: No apparent anesthesia complications

## 2018-06-12 ENCOUNTER — Encounter (HOSPITAL_COMMUNITY): Payer: Self-pay | Admitting: Urology

## 2018-06-12 LAB — HEMOGLOBIN AND HEMATOCRIT, BLOOD
HCT: 27.3 % — ABNORMAL LOW (ref 36.0–46.0)
Hemoglobin: 8.4 g/dL — ABNORMAL LOW (ref 12.0–15.0)

## 2018-06-12 LAB — CBC
HCT: 24.7 % — ABNORMAL LOW (ref 36.0–46.0)
Hemoglobin: 7.8 g/dL — ABNORMAL LOW (ref 12.0–15.0)
MCH: 28.5 pg (ref 26.0–34.0)
MCHC: 31.6 g/dL (ref 30.0–36.0)
MCV: 90.1 fL (ref 80.0–100.0)
Platelets: 221 10*3/uL (ref 150–400)
RBC: 2.74 MIL/uL — ABNORMAL LOW (ref 3.87–5.11)
RDW: 14.7 % (ref 11.5–15.5)
WBC: 18.5 10*3/uL — ABNORMAL HIGH (ref 4.0–10.5)
nRBC: 0 % (ref 0.0–0.2)

## 2018-06-12 LAB — BASIC METABOLIC PANEL
Anion gap: 9 (ref 5–15)
BUN: 25 mg/dL — ABNORMAL HIGH (ref 8–23)
CO2: 24 mmol/L (ref 22–32)
Calcium: 8.5 mg/dL — ABNORMAL LOW (ref 8.9–10.3)
Chloride: 103 mmol/L (ref 98–111)
Creatinine, Ser: 1.59 mg/dL — ABNORMAL HIGH (ref 0.44–1.00)
GFR calc Af Amer: 39 mL/min — ABNORMAL LOW (ref >60)
GFR calc non Af Amer: 33 mL/min — ABNORMAL LOW (ref >60)
Glucose, Bld: 153 mg/dL — ABNORMAL HIGH (ref 70–99)
Potassium: 4.2 mmol/L (ref 3.5–5.1)
Sodium: 136 mmol/L (ref 135–145)

## 2018-06-12 NOTE — Progress Notes (Signed)
Dressing at [place] site has been changed today x3.

## 2018-06-12 NOTE — Progress Notes (Signed)
CRITICAL VALUE ALERT  Critical Value:  HGB 7.8 Date & Time Notied:  06/12/2018 AT 1620  Provider Notified: MCKENZIE  Orders Received/Actions taken:

## 2018-06-12 NOTE — Progress Notes (Signed)
1 Day Post-Op Subjective: Patient reports moderate incisional pain. Hemoglobin 8.4. 500cc urine output overnight.   Objective: Vital signs in last 24 hours: Temp:  [97.8 F (36.6 C)-98.7 F (37.1 C)] 98.7 F (37.1 C) (04/24 1245) Pulse Rate:  [71-87] 82 (04/24 1245) Resp:  [11-16] 15 (04/24 1245) BP: (128-158)/(73-92) 139/74 (04/24 1245) SpO2:  [91 %-100 %] 94 % (04/24 1245)  Intake/Output from previous day: 04/23 0701 - 04/24 0700 In: 2721.7 [I.V.:2621.7; IV Piggyback:100] Out: 640 [Urine:75; Drains:215; Blood:350] Intake/Output this shift: Total I/O In: -  Out: 50 [Drains:55]  Physical Exam:  General:alert, cooperative and appears stated age GI: tenderness: RLQ Female genitalia: not done Extremities: extremities normal, atraumatic, no cyanosis or edema  Lab Results: Recent Labs    06/11/18 1609 06/12/18 0514  HGB 9.4* 8.4*  HCT 29.8* 27.3*   BMET Recent Labs    06/12/18 0514  NA 136  K 4.2  CL 103  CO2 24  GLUCOSE 153*  BUN 25*  CREATININE 1.59*  CALCIUM 8.5*   No results for input(s): LABPT, INR in the last 72 hours. No results for input(s): LABURIN in the last 72 hours. Results for orders placed or performed during the hospital encounter of 12/18/16  Urine culture     Status: None   Collection Time: 12/18/16  2:52 PM  Result Value Ref Range Status   Specimen Description URINE, CATHETERIZED  Final   Special Requests NONE  Final   Culture   Final    NO GROWTH Performed at [facility], [address] [place], [place] [address]    Report Status 12/20/2016 FINAL  Final    Studies/Results: No results found.  Assessment/Plan: POD#1 right radical nephrectomy  1. Repeat CBC this PM 2. Continue clears 3. Bedrest until tomorrow AM   LOS: 1 day   Nicolette Bang 06/12/2018, 1:40 PM

## 2018-06-12 NOTE — Progress Notes (Signed)
Assessment this am, 6 lap sites and 1 midline incision. Sites are clean and intact. JP dressing changed. Original dressing saturated. Will continue to monitor.

## 2018-06-13 LAB — CBC
HCT: 25.6 % — ABNORMAL LOW (ref 36.0–46.0)
Hemoglobin: 7.8 g/dL — ABNORMAL LOW (ref 12.0–15.0)
MCH: 28 pg (ref 26.0–34.0)
MCHC: 30.5 g/dL (ref 30.0–36.0)
MCV: 91.8 fL (ref 80.0–100.0)
Platelets: 220 10*3/uL (ref 150–400)
RBC: 2.79 MIL/uL — ABNORMAL LOW (ref 3.87–5.11)
RDW: 14.8 % (ref 11.5–15.5)
WBC: 14.9 10*3/uL — ABNORMAL HIGH (ref 4.0–10.5)
nRBC: 0 % (ref 0.0–0.2)

## 2018-06-13 MED ORDER — BISACODYL 10 MG RE SUPP
10.0000 mg | Freq: Once | RECTAL | Status: AC
  Administered 2018-06-13: 10 mg via RECTAL
  Filled 2018-06-13: qty 1

## 2018-06-13 NOTE — Progress Notes (Signed)
Patient ID: Katrina [name], female   DOB: [birth year], 68 y.o.   MRN: [id number]  2 Days Post-Op Subjective: Pt doing well this morning.  Pain controlled. She has not yet ambulated due to concerns of bleeding and liver laceration.  No nausea or vomiting.  No flatus or BM.  Objective: Vital signs in last 24 hours: Temp:  [98.3 F (36.8 C)-98.8 F (37.1 C)] 98.3 F (36.8 C) (04/25 0515) Pulse Rate:  [79-82] 79 (04/25 0515) Resp:  [11-18] 11 (04/25 0515) BP: (124-158)/(71-80) 124/71 (04/25 0515) SpO2:  [91 %-98 %] 95 % (04/25 0515)  Intake/Output from previous day: 04/24 0701 - 04/25 0700 In: 3176 [P.O.:30; I.V.:3146] Out: 4300 [Urine:2350; Drains:1950] Intake/Output this shift: Total I/O In: -  Out: 50 [Drains:50]  Physical Exam:  General: Alert and oriented Abdomen: Soft, Mild distention Incisions: C/D/I  Lab Results: Recent Labs    06/12/18 0514 06/12/18 1418 06/13/18 0128  HGB 8.4* 7.8* 7.8*  HCT 27.3* 24.7* 25.6*   BMET Recent Labs    06/12/18 0514  NA 136  K 4.2  CL 103  CO2 24  GLUCOSE 153*  BUN 25*  CREATININE 1.59*  CALCIUM 8.5*    Assessment/Plan: POD # 2 s/p right radical nephrectomy - Hgb stable.  Begin ambulation. - Oral pain medication - D/C Foley - Unclear as to reason for drain but output is high.  Will watch for now and continue to monitor Hgb and likely remove in AM tomorrow. - Path pending - Advance diet   LOS: 2 days   Dutch Gray 06/13/2018, 9:00 AM

## 2018-06-14 LAB — BASIC METABOLIC PANEL
Anion gap: 7 (ref 5–15)
BUN: 23 mg/dL (ref 8–23)
CO2: 25 mmol/L (ref 22–32)
Calcium: 8.1 mg/dL — ABNORMAL LOW (ref 8.9–10.3)
Chloride: 104 mmol/L (ref 98–111)
Creatinine, Ser: 1.73 mg/dL — ABNORMAL HIGH (ref 0.44–1.00)
GFR calc Af Amer: 35 mL/min — ABNORMAL LOW (ref >60)
GFR calc non Af Amer: 30 mL/min — ABNORMAL LOW (ref >60)
Glucose, Bld: 108 mg/dL — ABNORMAL HIGH (ref 70–99)
Potassium: 4 mmol/L (ref 3.5–5.1)
Sodium: 136 mmol/L (ref 135–145)

## 2018-06-14 LAB — TYPE AND SCREEN
ABO/RH(D): O NEG
Antibody Screen: NEGATIVE
Unit division: 0
Unit division: 0

## 2018-06-14 LAB — BPAM RBC
Blood Product Expiration Date: 202004302359
Blood Product Expiration Date: 202005012359
Unit Type and Rh: 9500
Unit Type and Rh: 9500

## 2018-06-14 LAB — HEMOGLOBIN AND HEMATOCRIT, BLOOD
HCT: 24.7 % — ABNORMAL LOW (ref 36.0–46.0)
Hemoglobin: 7.5 g/dL — ABNORMAL LOW (ref 12.0–15.0)

## 2018-06-14 MED ORDER — BISACODYL 10 MG RE SUPP
10.0000 mg | Freq: Once | RECTAL | Status: AC
  Administered 2018-06-14: 10 mg via RECTAL
  Filled 2018-06-14: qty 1

## 2018-06-14 NOTE — Discharge Summary (Signed)
  Date of admission: 06/11/2018  Date of discharge: 06/14/2018  Admission diagnosis: Right renal neoplasm  Discharge diagnosis: Right renal neoplasm  Secondary diagnoses: Hypertension  History and Physical: For full details, please see admission history and physical. Briefly, Katrina [name] is a 68 y.o. year old patient with a right renal neoplasm concerning for malignancy.   Hospital Course: She underwent a right RAL radical nephrectomy on 06/11/18.  She did have a small liver laceration noted intraoperatively.  She was maintained on bedrest with serial Hgb levels that stabilized.  She began ambulating on POD#2 and did well.  Her pain was controlled with oral pain medication.  Her Cr increased somewhat as expected s/p nephrectomy.  She was stable for discharge on POD #3.  Laboratory values:  Recent Labs    06/12/18 1418 06/13/18 0128 06/14/18 0507  HGB 7.8* 7.8* 7.5*  HCT 24.7* 25.6* 24.7*   Recent Labs    06/12/18 0514 06/14/18 0507  CREATININE 1.59* 1.73*    Disposition: Home  Discharge instruction: The patient was instructed to be ambulatory but told to refrain from heavy lifting, strenuous activity, or driving.   Discharge medications:  Allergies as of 06/14/2018      Reactions   Contrast Media [iodinated Diagnostic Agents] Hives, Itching      Medication List    STOP taking these medications   naproxen sodium 220 MG tablet Commonly known as:  ALEVE   vitamin B-12 1000 MCG tablet Commonly known as:  CYANOCOBALAMIN   Vitamin D3 50 MCG (2000 UT) Tabs     TAKE these medications   bisacodyl 5 MG EC tablet Commonly known as:  DULCOLAX Take 15 mg by mouth daily.   chlorpheniramine 4 MG tablet Commonly known as:  CHLOR-TRIMETON Take 4 mg by mouth daily.   cyclobenzaprine 10 MG tablet Commonly known as:  FLEXERIL Take 1 tablet (10 mg total) by mouth 3 (three) times daily as needed for muscle spasms. What changed:  when to take this   HYDROcodone-acetaminophen  5-325 MG tablet Commonly known as:  Norco Take 1-2 tablets by mouth every 6 (six) hours as needed for moderate pain or severe pain.   hydroxypropyl methylcellulose / hypromellose 2.5 % ophthalmic solution Commonly known as:  ISOPTO TEARS / GONIOVISC Place 1 drop into both eyes 3 (three) times daily as needed (dry/itchy eyes.).   triamterene-hydrochlorothiazide 37.5-25 MG capsule Commonly known as:  DYAZIDE Take 1 each (1 capsule total) by mouth daily.       Followup:  Follow-up Information    McKenzie, Candee Furbish, MD On 06/17/2018.   Specialty:  Urology Why:  at 10:30 Contact information: [place] [address] [address] [phone]

## 2018-06-14 NOTE — TOC Initial Note (Signed)
Transition of Care [facility]) - Initial/Assessment Note    Patient Details  Name: Katrina [name] MRN: [id number] Date of Birth: 09-24-1950  [facility] CM/SW Contact:    Erenest Rasher, RN Phone Number: 06/14/2018, 9:53 AM  Clinical Narrative:                 Pt states he sister will assist her at home. She states she still drives.   Expected Discharge Plan: Home/Self Care Barriers to Discharge: No Barriers Identified   Patient Goals and CMS Choice        Expected Discharge Plan and Services Expected Discharge Plan: Home/Self Care In-house Referral: NA Discharge Planning Services: CM Consult Post Acute Care Choice: NA Living arrangements for the past 2 months: Single Family Home Expected Discharge Date: 06/14/18               DME Arranged: N/A DME Agency: NA         HH Agency: NA        Prior Living Arrangements/Services Living arrangements for the past 2 months: [place] with:: Self Patient language and need for interpreter reviewed:: Yes Do you feel safe going back to the place where you live?: Yes      Need for Family Participation in Patient Care: No (Comment) Care giver support system in place?: No (comment) Current home services: DME([facility]) Criminal Activity/Legal Involvement Pertinent to Current Situation/Hospitalization: No - Comment as needed  Activities of Daily Living Home Assistive Devices/Equipment: Environmental consultant (specify type), Cane (specify quad or straight), Blood pressure cuff, Eyeglasses, Grab bars in shower, Dentures (specify type) ADL Screening (condition at time of admission) Patient's cognitive ability adequate to safely complete daily activities?: Yes Is the patient deaf or have difficulty hearing?: No Does the patient have difficulty seeing, even when wearing glasses/contacts?: No Does the patient have difficulty concentrating, remembering, or making decisions?: No Patient able to express need for  assistance with ADLs?: Yes Does the patient have difficulty dressing or bathing?: No Independently performs ADLs?: Yes (appropriate for developmental age) Does the patient have difficulty walking or climbing stairs?: No Weakness of Legs: None Weakness of Arms/Hands: None  Permission Sought/Granted Permission sought to share information with : Case Manager, Family Supports, PCP Permission granted to share information with : Yes, Verbal Permission Granted  Share Information with NAME: Danton Clap     Permission granted to share info w Relationship: sister     Emotional Assessment Appearance:: Appears stated age Attitude/Demeanor/Rapport: Engaged Affect (typically observed): Accepting Orientation: : Oriented to Self, Oriented to Place, Oriented to  Time, Oriented to Situation   Psych Involvement: No (comment)  Admission diagnosis:  RIGHT RENAL CELL CARCINOMA Patient Active Problem List   Diagnosis Date Noted  . Renal mass 06/11/2018  . Personal history of colonic polyps   . Kidney lesion, native, right 05/16/2017  . Contrast media allergy 05/16/2017  . Chronic nausea 04/18/2017  . Anemia due to antineoplastic chemotherapy 01/31/2017  . Diarrhea 01/31/2017  . Dehydration 01/31/2017  . Hypokalemia 01/08/2017  . AKI (acute kidney injury) ([place])   . Hyponatremia   . Acute blood loss anemia   . Benign essential HTN   . 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 Paraparesis ([place]) 12/19/2016  . Chest wall mass 12/19/2016  . 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 S/P lumbar laminectomy 12/18/2016  . Numbness and tingling 12/13/2016  . Numbness and tingling of both  lower extremities 12/12/2016  . Hypertension 12/12/2016  . Anemia 12/12/2016  . Abdominal lymphadenopathies 12/12/2016  . Renal mass, right 12/12/2016  . Constipation 12/12/2016  . FH: colon cancer 10/22/2013  . RUQ discomfort 10/22/2013   PCP:  Lemmie Evens, MD Pharmacy:    [place], [place] - [address] [place] [address] [place] [address] [place] [address] Phone: [phone] Fax: [phone]     Social Determinants of Health (SDOH) Interventions    Readmission Risk Interventions No flowsheet data found.

## 2018-06-14 NOTE — Progress Notes (Signed)
Patient ID: Katrina [name], female   DOB: 29-Jan-1951, 68 y.o.   MRN: [id number]  3 Days Post-Op Subjective: Doing well. Some nausea yesterday but now improved.  Has passed flatus.  No BM yet. Pain controlled.  Ambulating well.  Objective: Vital signs in last 24 hours: Temp:  [97.6 F (36.4 C)-99.4 F (37.4 C)] 99 F (37.2 C) (04/26 0455) Pulse Rate:  [70-88] 86 (04/26 0455) Resp:  [14-19] 18 (04/26 0455) BP: (118-157)/(69-77) 136/70 (04/26 0455) SpO2:  [92 %-96 %] 92 % (04/26 0455)  Intake/Output from previous day: 04/25 0701 - 04/26 0700 In: 510 [P.O.:510] Out: 850 [Urine:500; Drains:350] Intake/Output this shift: No intake/output data recorded.  Physical Exam:  General: Alert and oriented Abdomen: Soft, mild distention Incisions: C/D/I Ext: NT, No erythema  Lab Results: Recent Labs    06/12/18 1418 06/13/18 0128 06/14/18 0507  HGB 7.8* 7.8* 7.5*  HCT 24.7* 25.6* 24.7*   BMET Recent Labs    06/12/18 0514 06/14/18 0507  NA 136 136  K 4.2 4.0  CL 103 104  CO2 24 25  GLUCOSE 153* 108*  BUN 25* 23  CREATININE 1.59* 1.73*  CALCIUM 8.5* 8.1*     Studies/Results: No results found.  Assessment/Plan: POD # 3 s/p right radical nephrectomy - D/C drain - D/C home today   LOS: 3 days   Dutch Gray 06/14/2018, 8:37 AM

## 2018-06-14 NOTE — Care Management Important Message (Signed)
Important Message  Patient Details  Name: Katrina [name] MRN: [id number] Date of Birth: [birth year]   Medicare Important Message Given:  Yes    Erenest Rasher, RN 06/14/2018, 9:36 AM

## 2018-06-20 IMAGING — US IR US GUIDE VASC ACCESS RIGHT
1 series · 1 of 1 positions shown · non-contrast
Comparison: none

CLINICAL DATA: Diffuse large B-cell lymphoma and need for porta
cath to begin chemotherapy.

[Series 1: ir us guide vasc access right · 1 of 1 slices shown]
[im 1/1]
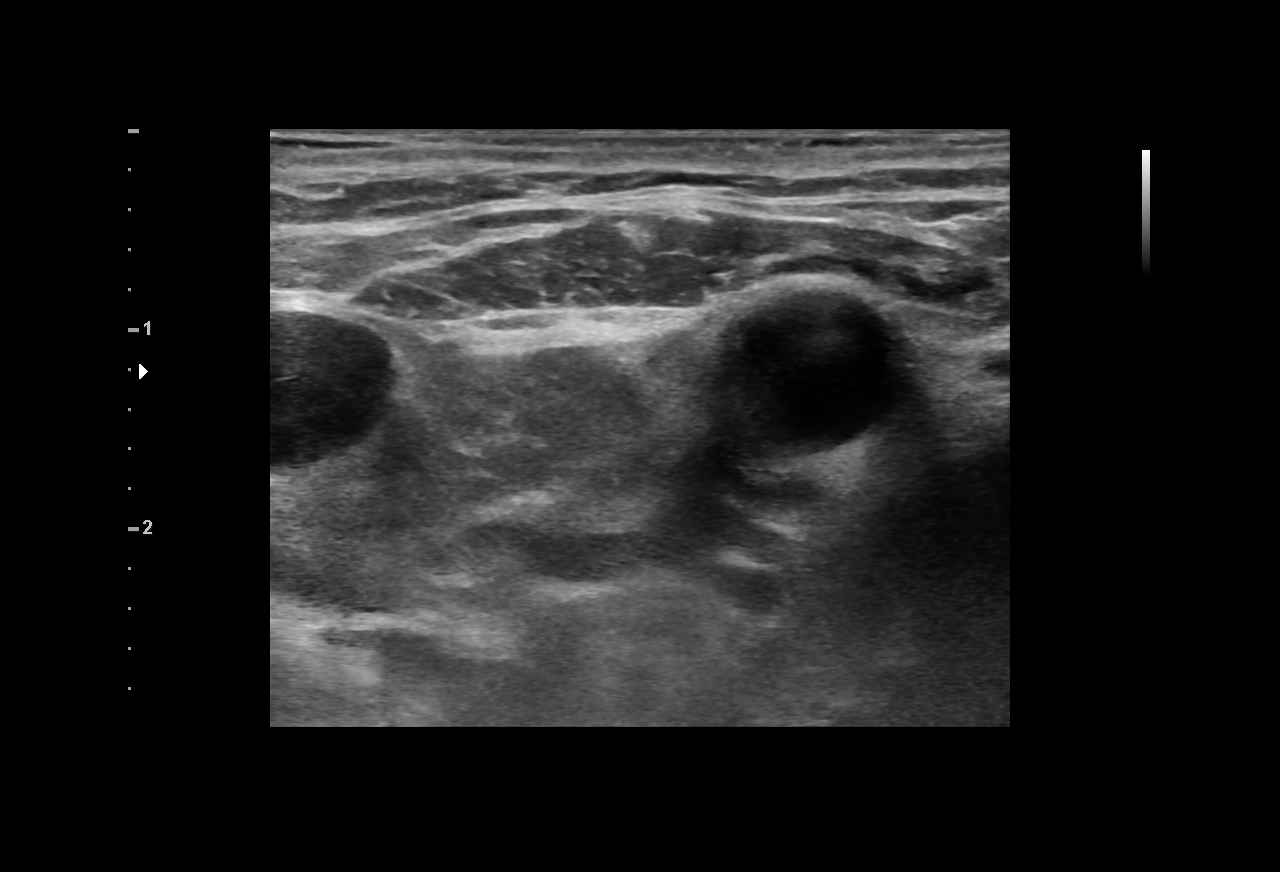

[1 of 1 positions shown; findings below may reference images not displayed]

EXAM:
IMPLANTED PORT A CATH PLACEMENT WITH ULTRASOUND AND FLUOROSCOPIC
GUIDANCE

ANESTHESIA/SEDATION:
2.0 mg IV Versed; 100 mcg IV Fentanyl

Total Moderate Sedation Time:  33 minutes

The patient's level of consciousness and physiologic status were
continuously monitored during the procedure by Radiology nursing.

Additional Medications: 2 g IV Ancef.

FLUOROSCOPY TIME:  24 seconds.  2.0 mGy.

PROCEDURE:
The procedure, risks, benefits, and alternatives were explained to
the patient. Questions regarding the procedure were encouraged and
answered. The patient understands and consents to the procedure. A
time-out was performed prior to initiating the procedure.

Ultrasound was utilized to confirm patency of the right internal
jugular vein. The right neck and chest were prepped with
chlorhexidine in a sterile fashion, and a sterile drape was applied
covering the operative field. Maximum barrier sterile technique with
sterile gowns and gloves were used for the procedure. Local
anesthesia was provided with 1% lidocaine.

After creating a small venotomy incision, a 21 gauge needle was
advanced into the right internal jugular vein under direct,
real-time ultrasound guidance. Ultrasound image documentation was
performed. After securing guidewire access, an 8 Fr dilator was
placed. A J-wire was kinked to measure appropriate catheter length.

A subcutaneous port pocket was then created along the upper chest
wall utilizing sharp and blunt dissection. Portable cautery was
utilized. The pocket was irrigated with sterile saline.

A single lumen power injectable port was chosen for placement. The 8
Fr catheter was tunneled from the port pocket site to the venotomy
incision. The port was placed in the pocket. External catheter was
trimmed to appropriate length based on guidewire measurement.

At the venotomy, an 8 Fr peel-away sheath was placed over a
guidewire. The catheter was then placed through the sheath and the
sheath removed. Final catheter positioning was confirmed and
documented with a fluoroscopic spot image. The port was accessed
with a needle and aspirated and flushed with heparinized saline. The
access needle was removed.

The venotomy and port pocket incisions were closed with subcutaneous
3-0 Monocryl and subcuticular 4-0 Vicryl. Dermabond was applied to
both incisions.

COMPLICATIONS:
COMPLICATIONS
None
FINDINGS: After catheter placement, the tip lies at the [place] junction.
The catheter aspirates normally and is ready for immediate use.
IMPRESSION: Placement of single lumen port a cath via right internal jugular
vein. The catheter tip lies at the [place] junction. A power
injectable port a cath was placed and is ready for immediate use.

## 2018-06-22 ENCOUNTER — Other Ambulatory Visit: Payer: Self-pay | Admitting: Hematology and Oncology

## 2018-06-22 DIAGNOSIS — C8330 Diffuse large B-cell lymphoma, unspecified site: Secondary | ICD-10-CM

## 2018-06-26 ENCOUNTER — Encounter: Payer: Self-pay | Admitting: Hematology and Oncology

## 2018-06-26 ENCOUNTER — Inpatient Hospital Stay: Payer: Medicare Other | Admitting: Hematology and Oncology

## 2018-06-26 ENCOUNTER — Other Ambulatory Visit: Payer: Self-pay

## 2018-06-26 ENCOUNTER — Inpatient Hospital Stay: Payer: Medicare Other | Attending: Hematology and Oncology

## 2018-06-26 DIAGNOSIS — C641 Malignant neoplasm of right kidney, except renal pelvis: Secondary | ICD-10-CM

## 2018-06-26 DIAGNOSIS — I129 Hypertensive chronic kidney disease with stage 1 through stage 4 chronic kidney disease, or unspecified chronic kidney disease: Secondary | ICD-10-CM | POA: Diagnosis not present

## 2018-06-26 DIAGNOSIS — C8330 Diffuse large B-cell lymphoma, unspecified site: Secondary | ICD-10-CM | POA: Diagnosis present

## 2018-06-26 DIAGNOSIS — N183 Chronic kidney disease, stage 3 unspecified: Secondary | ICD-10-CM

## 2018-06-26 DIAGNOSIS — D62 Acute posthemorrhagic anemia: Secondary | ICD-10-CM

## 2018-06-26 DIAGNOSIS — D631 Anemia in chronic kidney disease: Secondary | ICD-10-CM | POA: Diagnosis not present

## 2018-06-26 DIAGNOSIS — Z79899 Other long term (current) drug therapy: Secondary | ICD-10-CM | POA: Insufficient documentation

## 2018-06-26 LAB — COMPREHENSIVE METABOLIC PANEL
ALT: 19 U/L (ref 0–44)
AST: 18 U/L (ref 15–41)
Albumin: 3.8 g/dL (ref 3.5–5.0)
Alkaline Phosphatase: 69 U/L (ref 38–126)
Anion gap: 9 (ref 5–15)
BUN: 26 mg/dL — ABNORMAL HIGH (ref 8–23)
CO2: 25 mmol/L (ref 22–32)
Calcium: 9.5 mg/dL (ref 8.9–10.3)
Chloride: 107 mmol/L (ref 98–111)
Creatinine, Ser: 1.74 mg/dL — ABNORMAL HIGH (ref 0.44–1.00)
GFR calc Af Amer: 35 mL/min — ABNORMAL LOW (ref >60)
GFR calc non Af Amer: 30 mL/min — ABNORMAL LOW (ref >60)
Glucose, Bld: 99 mg/dL (ref 70–99)
Potassium: 4.6 mmol/L (ref 3.5–5.1)
Sodium: 141 mmol/L (ref 135–145)
Total Bilirubin: <0.2 mg/dL — ABNORMAL LOW (ref 0.3–1.2)
Total Protein: 7 g/dL (ref 6.5–8.1)

## 2018-06-26 LAB — CBC WITH DIFFERENTIAL/PLATELET
Abs Immature Granulocytes: 0.03 10*3/uL (ref 0.00–0.07)
Basophils Absolute: 0.1 10*3/uL (ref 0.0–0.1)
Basophils Relative: 1 %
Eosinophils Absolute: 0.3 10*3/uL (ref 0.0–0.5)
Eosinophils Relative: 3 %
HCT: 27.9 % — ABNORMAL LOW (ref 36.0–46.0)
Hemoglobin: 8.7 g/dL — ABNORMAL LOW (ref 12.0–15.0)
Immature Granulocytes: 0 %
Lymphocytes Relative: 16 %
Lymphs Abs: 1.3 10*3/uL (ref 0.7–4.0)
MCH: 27.8 pg (ref 26.0–34.0)
MCHC: 31.2 g/dL (ref 30.0–36.0)
MCV: 89.1 fL (ref 80.0–100.0)
Monocytes Absolute: 0.7 10*3/uL (ref 0.1–1.0)
Monocytes Relative: 9 %
Neutro Abs: 5.6 10*3/uL (ref 1.7–7.7)
Neutrophils Relative %: 71 %
Platelets: 389 10*3/uL (ref 150–400)
RBC: 3.13 MIL/uL — ABNORMAL LOW (ref 3.87–5.11)
RDW: 14.5 % (ref 11.5–15.5)
WBC: 7.9 10*3/uL (ref 4.0–10.5)
nRBC: 0 % (ref 0.0–0.2)

## 2018-06-26 LAB — SAMPLE TO BLOOD BANK

## 2018-06-26 LAB — LACTATE DEHYDROGENASE: LDH: 160 U/L (ref 98–192)

## 2018-06-26 NOTE — Assessment & Plan Note (Signed)
She has chronic kidney disease stage III since 2 recent nephrectomy For this reason, I will not order IV contrast I recommend the patient not to take ibuprofen in the future for pain management

## 2018-06-26 NOTE — Assessment & Plan Note (Signed)
Clinically, she has no detectable lymphadenopathy I will order CT imaging to follow

## 2018-06-26 NOTE — Progress Notes (Signed)
[facility] OFFICE PROGRESS NOTE  Patient Care Team: Lemmie Evens, MD as PCP - General (Family Medicine) Danie Binder, MD as Consulting Physician (Gastroenterology) Alyson Ingles Candee Furbish, MD as Consulting Physician (Urology)  ASSESSMENT & PLAN:  Diffuse large B cell lymphoma ([place]) Clinically, she has no detectable lymphadenopathy I will order CT imaging to follow  Cancer of right kidney [facility]) I have reviewed final pathology of her recent nephrectomy specimen with the patient The patient have early stage I disease I will order CT imaging next week for new baseline We also reviewed current guidelines Due to early stage disease, she does not need adjuvant treatment.  We also discussed the importance of surveillance imaging studies  CKD (chronic kidney disease), stage III ([place]) She has chronic kidney disease stage III since 2 recent nephrectomy For this reason, I will not order IV contrast I recommend the patient not to take ibuprofen in the future for pain management  Acute blood loss anemia She has stable anemia She does not need transfusion support It is multifactorial due to recent blood loss and chronic kidney disease stage III She is not symptomatic Observe only   Orders Placed This Encounter  Procedures  . CT Chest Wo Contrast    Standing Status:   Future    Standing Expiration Date:   06/26/2019    Order Specific Question:   Preferred imaging location?    Answer:   [facility]    Order Specific Question:   Radiology Contrast Protocol - do NOT remove file path    Answer:   [email or web address]  . CT Abdomen Pelvis Wo Contrast    Standing Status:   Future    Standing Expiration Date:   06/26/2019    Order Specific Question:   Preferred imaging location?    Answer:   [facility]    Order Specific Question:   Is Oral Contrast requested for this exam?    Answer:   Yes, Per Radiology protocol    Order Specific  Question:   Radiology Contrast Protocol - do NOT remove file path    Answer:   [email or web address]    INTERVAL HISTORY: Please see below for problem oriented charting. She returns to me for lymphoma follow-up and follow-up on recent kidney cancer surgery She is healing well She has minimum incisional pain She denies changes in her bowel habits No new lymphadenopathy Denies recent infection, fever or chills She has some mild fatigue but overall denies chest pain or shortness of breath  SUMMARY OF ONCOLOGIC HISTORY:   Diffuse large B cell lymphoma ([place])   12/12/2016 Imaging    CT scan showed  1. Extensive mesenteric, retroperitoneal, left hilar, mediastinal, left supraclavicular and left axillary adenopathy. Differential considerations include metastatic adenopathy, lymphoma and leukemia. The size of the nodes in the mesentery and retroperitoneum are suggestive of non-Hodgkin's lymphoma. 2. 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 Differential considerations include metastatic disease and lymphoma. A left upper lobe primary lung carcinoma is a possibility. 3. 3.2 cm solid right renal mass. Differential considerations include renal cell carcinoma and oncocytoma. 4. Small amount of free peritoneal fluid, most likely due to lymphatic obstruction by the mesenteric adenopathy. 5. Left fourth rib fracture. 6. Colonic diverticulosis. 7. Calcific coronary artery and aortic atherosclerosis. Aortic Atherosclerosis (ICD10-I70.0).     12/12/2016 - 12/14/2016 Hospital Admission    He was briefly admitted and evaluated for weakness.  CT  imaging showed diffuse disease suspicious for lymphoma and outpatient evaluation was arranged     12/18/2016 Imaging    MR thoracic spine Very limited examination demonstrating compression of the cord from approximately mid T6 to T7-8 by epidural tumor eccentric to the right  and surrounding the cord both anteriorly and posteriorly. Tumor fills the right neural foramina at T6-7 and T7-8 and completely replaces the T7 vertebral body.    12/18/2016 Surgery    She underwent procedure: Decompressive thoracic laminectomy from T5-T8 for resection of epidural tumor      12/18/2016 Pathology Results    1. Soft tissue mass, simple excision, thoracic dorsal epidural mass - DIFFUSE LARGE B-CELL LYMPHOMA. - SEE ONCOLOGY TABLE. 2. Soft tissue mass, simple excision, thoracic dorsal epidural mass - DIFFUSE LARGE B-CELL LYMPHOMA. Microscopic Comment 1. LYMPHOMA Histologic type: Non-Hodgkin B-cell lymphoma: Diffuse large B-cell lymphoma. Grade (if applicable): High grade. Flow cytometry: N/A. Immunohistochemical stains: CD20. CD3, CD5, CD10, CD23, CD30, CD15, PAX-5, Ki-67, bcl-2, bcl-6, CD34. Touch preps/imprints: N/A. Comments: There is a diffuse infiltrate of atypical lymphocytes. The lymphocytes are medium to large in size with irregular nuclear contours. There are background smaller lymphocytes. While there are admixed smaller cells, there are diffuse areas of large cells. The large lymphocytes are positive for CD20, CD10, bcl-6, and bcl-2. CD3 and CD5 highlight admixed T-cells. CD34, CD30, CD23, and CD15 are negative. PAX-5 is positive. Ki-67 is variable with areas up to 70%. 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     12/18/2016 - 01/17/2017 Hospital Admission    The patient was readmitted to the hospital due to complete weakness secondary to spinal cord compression.  She underwent surgery and pathology report confirmed diagnosis of diffuse large B-cell lymphoma. She received IT chemo on 01/16/17    01/16/2017 Pathology Results    CEREBROSPINAL FLUID (SPECIMEN 1 OF 1 COLLECTED 01/16/17): NO  MALIGNANT CELLS IDENTIFIED.    02/03/2017 - 02/07/2017 Hospital Admission    She is admitted for cycle 2 of chemo    02/24/2017 - 02/28/2017 Hospital Admission    She is admitted for cycle 3 of chemotherapy    03/13/2017 PET scan    1. Low level FDG uptake associated with previous areas of soft tissue tumor within the chest and abdomen. Deauville criteria 2 and 3 compatible with treated tumor. No sites of residual metabolically active tumor (Deauville criteria 4 or 5) identified. 2. Mild diffuse uptake throughout the bone marrow is noted which is nonspecific in may reflect treatment related changes. 3. Stable size of previously noted complex lesion arising from the posterior cortex of the right kidney which now measures fluid attenuation. This is incompletely characterized on today's given lack of IV contrast material. 4. Aortic Atherosclerosis (ICD10-I70.0). Multi vessel coronary artery calcifications noted.    03/13/2017 Imaging    Study Conclusions  - Procedure narrative: Transthoracic echocardiography. Image quality was suboptimal. The study was technically difficult, as a result of poor acoustic windows, poor sound wave transmission, chest wall deformity, and body habitus. - Left ventricle: GLLS is normal at -19% The cavity size was normal. There was mild focal basal hypertrophy of the septum. Systolic function was vigorous. The estimated ejection fraction was in the range of 65% to 70%. Wall motion was normal; there were no regional wall motion abnormalities. There was an increased relative contribution of atrial  contraction to ventricular filling. Doppler parameters are consistent with abnormal left ventricular relaxation (grade 1 diastolic dysfunction). - Pulmonary arteries: Systolic pressure could not be accurately estimated.    03/24/2017 - 03/28/2017 Hospital Admission    The patient was admitted to the hospital for cycle 4 of chemotherapy    04/21/2017 - 04/25/2017 Hospital Admission    She  is admitted for cycle 5 of R-EPOCH    05/19/2017 - 05/23/2017 Hospital Admission    She is admitted for cycle 6 of R-EPOCH    06/20/2017 Imaging    1. No findings for residual or recurrent lymphoma involving the chest. The left fifth rib is healed and the surrounding extensive soft tissue density has completely resolved. 2. Interval regression of the vague mesenteric and retroperitoneal soft tissue densities and hazy interstitial soft tissue thickening around the mesenteric vessels. No new or enlarging lymph nodes to  suggest recurrent lymphoma. 3. 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 4. Decompressive laminectomies in the midthoracic spine. No findings for recurrent spinal tumor.    07/04/2017 Procedure    Removal of implanted Port-A-Cath utilizing sharp and blunt dissection. The procedure was uncomplicated.    12/25/2017 Imaging    IMPRESSION: Chest Impression:  1. No evidence of lymphoma recurrence in thorax.  Abdomen / Pelvis Impression:  1. No evidence of lymphoma recurrence in the abdomen pelvis. 2. Stable hazy peritoneal fat surrounding the mesenteric vessels. 3. Stable ill-defined nodular density in the LEFT retroperitoneum adjacent to the aorta. 4. Interval increase in size of RIGHT renal neoplasm     Cancer of right kidney ([place])   06/11/2018 Surgery    Procedure: 1.  right robot assisted laparoscopic radical nephrectomy  Attending: Nicolette Bang  Specimens: right radical nephrectomy  Findings: 1 renal artery and 1 renal vein. 2cm liver laceration requiring surgicel and surgiflo. The assistant was utilized for retraction, suction, passing instruments, and deploying the stapler cross the renal vein.     06/11/2018 Surgery    Kidney, radical nephrectomy for tumor, right RENAL CELL CARCINOMA, CONVENTIONAL CLEAR CELL TYPE, NUCLEAR GRADE 2 (SIZE 3.6 CM) TUMOR IS LIMITED TO THE KIDNEY (PT1A) LYMPHOVASCULAR INVASION IS  IDENTIFIED URETERAL, VASCULAR AND ALL RESECTION MARGINS ARE NEGATIVE FOR TUMOR Microscopic Comment KIDNEY: Procedure: Radical Nephrectomy Specimen Laterality: Right Tumor Size: 3.6 cm Tumor Focality: Focal Histologic Type: Clear cell RCC Sarcomatoid Features: Negative Rhabdoid Features: Negative Histologic Grade: 2 Tumor Necrosis: Negative Tumor Extension: Negative Margins: Negative Regional Lymph Nodes: X No lymph nodes submitted or found Number of Lymph Nodes Involved: NA Number of Lymph Nodes Examined: NA Pathologic Stage Classification (pTNM, AJCC 8th Edition): pT 1a, pNx Pathologic Findings in Nonneoplastic Kidney: Chronic interstitial inflammation    06/26/2018 Cancer Staging    Staging form: Kidney, AJCC 8th Edition - Pathologic: Stage I (pT1a, pN0, cM0) - Signed by Heath Lark, MD on 06/26/2018     REVIEW OF SYSTEMS:   Constitutional: Denies fevers, chills or abnormal weight loss Eyes: Denies blurriness of vision Ears, nose, mouth, throat, and face: Denies mucositis or sore throat Respiratory: Denies cough, dyspnea or wheezes Cardiovascular: Denies palpitation, chest discomfort or lower extremity swelling Gastrointestinal:  Denies nausea, heartburn or change in bowel habits Skin: Denies abnormal skin rashes Lymphatics: Denies new lymphadenopathy or easy bruising Neurological:Denies numbness, tingling or new weaknesses Behavioral/Psych: Mood is stable, no new changes  All other systems were reviewed with the patient and are negative.  I have reviewed the past medical  history, past surgical history, social history and family history with the patient and they are unchanged from previous note.  ALLERGIES:  is allergic to contrast media [iodinated diagnostic agents].  MEDICATIONS:  Current Outpatient Medications  Medication Sig Dispense Refill  . bisacodyl (DULCOLAX) 5 MG EC tablet Take 15 mg by mouth daily.    . chlorpheniramine (CHLOR-TRIMETON) 4 MG tablet Take 4 mg  by mouth daily.    . cyclobenzaprine (FLEXERIL) 10 MG tablet Take 1 tablet (10 mg total) by mouth 3 (three) times daily as needed for muscle spasms. (Patient taking differently: Take 10 mg by mouth at bedtime. ) 90 tablet 1  . HYDROcodone-acetaminophen (NORCO) 5-325 MG tablet Take 1-2 tablets by mouth every 6 (six) hours as needed for moderate pain or severe pain. 30 tablet 0  . hydroxypropyl methylcellulose / hypromellose (ISOPTO TEARS / GONIOVISC) 2.5 % ophthalmic solution Place 1 drop into both eyes 3 (three) times daily as needed (dry/itchy eyes.).    Marland Kitchen triamterene-hydrochlorothiazide (DYAZIDE) 37.5-25 MG capsule Take 1 each (1 capsule total) by mouth daily. 30 capsule 3   No current facility-administered medications for this visit.     PHYSICAL EXAMINATION: ECOG PERFORMANCE STATUS: 1 - Symptomatic but completely ambulatory  Vitals:   06/26/18 1241  BP: 136/87  Pulse: 81  Resp: 18  Temp: 99.1 F (37.3 C)  SpO2: 100%   Filed Weights   06/26/18 1241  Weight: 216 lb 12.8 oz (98.3 kg)    GENERAL:alert, no distress and comfortable SKIN: skin color, texture, turgor are normal, no rashes or significant lesions EYES: normal, Conjunctiva are pink and non-injected, sclera clear OROPHARYNX:no exudate, no erythema and lips, buccal mucosa, and tongue normal  NECK: supple, thyroid normal size, non-tender, without nodularity LYMPH:  no palpable lymphadenopathy in the cervical, axillary or inguinal LUNGS: clear to auscultation and percussion with normal breathing effort HEART: regular rate & rhythm and no murmurs and no lower extremity edema ABDOMEN:abdomen soft, non-tender and normal bowel sounds.  Noted well-healed surgical scar Musculoskeletal:no cyanosis of digits and no clubbing  NEURO: alert & oriented x 3 with fluent speech, no focal motor/sensory deficits  LABORATORY DATA:  I have reviewed the data as listed    Component Value Date/Time   NA 141 06/26/2018 1217   NA 134 (L)  02/19/2017 0925   K 4.6 06/26/2018 1217   K 3.0 (LL) 02/19/2017 0925   CL 107 06/26/2018 1217   CO2 25 06/26/2018 1217   CO2 29 02/19/2017 0925   GLUCOSE 99 06/26/2018 1217   GLUCOSE 135 02/19/2017 0925   BUN 26 (H) 06/26/2018 1217   BUN 7.4 02/19/2017 0925   CREATININE 1.74 (H) 06/26/2018 1217   CREATININE 0.9 02/19/2017 0925   CALCIUM 9.5 06/26/2018 1217   CALCIUM 8.9 02/19/2017 0925   PROT 7.0 06/26/2018 1217   PROT 6.4 02/19/2017 0925   ALBUMIN 3.8 06/26/2018 1217   ALBUMIN 3.4 (L) 02/19/2017 0925   AST 18 06/26/2018 1217   AST 16 02/19/2017 0925   ALT 19 06/26/2018 1217   ALT 13 02/19/2017 0925   ALKPHOS 69 06/26/2018 1217   ALKPHOS 86 02/19/2017 0925   BILITOT <0.2 (L) 06/26/2018 1217   BILITOT 0.65 02/19/2017 0925   GFRNONAA 30 (L) 06/26/2018 1217   GFRAA 35 (L) 06/26/2018 1217    No results found for: SPEP, UPEP  Lab Results  Component Value Date   WBC 7.9 06/26/2018   NEUTROABS 5.6 06/26/2018   HGB 8.7 (L) 06/26/2018  HCT 27.9 (L) 06/26/2018   MCV 89.1 06/26/2018   PLT 389 06/26/2018      Chemistry      Component Value Date/Time   NA 141 06/26/2018 1217   NA 134 (L) 02/19/2017 0925   K 4.6 06/26/2018 1217   K 3.0 (LL) 02/19/2017 0925   CL 107 06/26/2018 1217   CO2 25 06/26/2018 1217   CO2 29 02/19/2017 0925   BUN 26 (H) 06/26/2018 1217   BUN 7.4 02/19/2017 0925   CREATININE 1.74 (H) 06/26/2018 1217   CREATININE 0.9 02/19/2017 0925      Component Value Date/Time   CALCIUM 9.5 06/26/2018 1217   CALCIUM 8.9 02/19/2017 0925   ALKPHOS 69 06/26/2018 1217   ALKPHOS 86 02/19/2017 0925   AST 18 06/26/2018 1217   AST 16 02/19/2017 0925   ALT 19 06/26/2018 1217   ALT 13 02/19/2017 0925   BILITOT <0.2 (L) 06/26/2018 1217   BILITOT 0.65 02/19/2017 0925       All questions were answered. The patient knows to call the clinic with any problems, questions or concerns. No barriers to learning was detected.  I spent 30 minutes counseling the patient  face to face. The total time spent in the appointment was 40 minutes and more than 50% was on counseling and review of test results  Heath Lark, MD 06/26/2018 2:17 PM

## 2018-06-26 NOTE — Assessment & Plan Note (Signed)
She has stable anemia She does not need transfusion support It is multifactorial due to recent blood loss and chronic kidney disease stage III She is not symptomatic Observe only

## 2018-06-26 NOTE — Assessment & Plan Note (Addendum)
I have reviewed final pathology of her recent nephrectomy specimen with the patient The patient have early stage I disease I will order CT imaging next week for new baseline We also reviewed current guidelines Due to early stage disease, she does not need adjuvant treatment.  We also discussed the importance of surveillance imaging studies

## 2018-07-01 ENCOUNTER — Telehealth: Payer: Self-pay | Admitting: Hematology and Oncology

## 2018-07-01 NOTE — Telephone Encounter (Signed)
Scheduled CT f/u per sch msg. Called and confirmed with patient.

## 2018-07-03 ENCOUNTER — Ambulatory Visit (HOSPITAL_COMMUNITY)
Admission: RE | Admit: 2018-07-03 | Discharge: 2018-07-03 | Disposition: A | Discharge status: Home / Self Care | Payer: Medicare Other | Source: Ambulatory Visit | Attending: Hematology and Oncology | Admitting: Hematology and Oncology

## 2018-07-03 ENCOUNTER — Other Ambulatory Visit: Payer: Self-pay

## 2018-07-03 DIAGNOSIS — C8330 Diffuse large B-cell lymphoma, unspecified site: Secondary | ICD-10-CM | POA: Diagnosis not present

## 2018-07-03 DIAGNOSIS — C641 Malignant neoplasm of right kidney, except renal pelvis: Secondary | ICD-10-CM | POA: Diagnosis present

## 2018-07-06 ENCOUNTER — Other Ambulatory Visit: Payer: Self-pay

## 2018-07-06 ENCOUNTER — Inpatient Hospital Stay: Payer: Medicare Other | Admitting: Hematology and Oncology

## 2018-07-06 VITALS — BP 132/85 | HR 98 | Temp 98.5°F | Resp 18 | Ht 68.0 in | Wt 217.4 lb

## 2018-07-06 DIAGNOSIS — N183 Chronic kidney disease, stage 3 unspecified: Secondary | ICD-10-CM

## 2018-07-06 DIAGNOSIS — C641 Malignant neoplasm of right kidney, except renal pelvis: Secondary | ICD-10-CM | POA: Diagnosis not present

## 2018-07-06 DIAGNOSIS — I10 Essential (primary) hypertension: Secondary | ICD-10-CM | POA: Diagnosis not present

## 2018-07-06 DIAGNOSIS — C8330 Diffuse large B-cell lymphoma, unspecified site: Secondary | ICD-10-CM

## 2018-07-06 DIAGNOSIS — D631 Anemia in chronic kidney disease: Secondary | ICD-10-CM

## 2018-07-07 ENCOUNTER — Encounter: Payer: Self-pay | Admitting: Hematology and Oncology

## 2018-07-07 DIAGNOSIS — N189 Chronic kidney disease, unspecified: Secondary | ICD-10-CM | POA: Insufficient documentation

## 2018-07-07 DIAGNOSIS — D631 Anemia in chronic kidney disease: Secondary | ICD-10-CM | POA: Insufficient documentation

## 2018-07-07 IMAGING — RF DG FLUORO GUIDE NDL PLC/BX
1 series · 1 of 1 positions shown · non-contrast
Comparison: none

CLINICAL DATA: B-cell lymphoma

EXAM:
FLUOROSCOPICALLY GUIDED LUMBAR PUNCTURE FOR INTRATHECAL
CHEMOTHERAPY
TECHNIQUE: Informed consent was obtained from the patient prior to the
procedure, including potential complications of headache, allergy,
and pain. A 'time out' was performed. With the patient prone, the
lower back was prepped with Betadine. 1% Lidocaine was used for
local anesthesia. Lumbar puncture was performed at the L3-4 using a
20 gauge
needle with return of clear CSF. 8 cc clear CSF S collected for
testing. 12 mg Of methotrexate was injected into the subarachnoid
space. The patient tolerated the procedure well without apparent
complication.
FLUOROSCOPY TIME:  13 seconds

[Series 1: run · 1 of 1 slices shown]
[im 1/1]
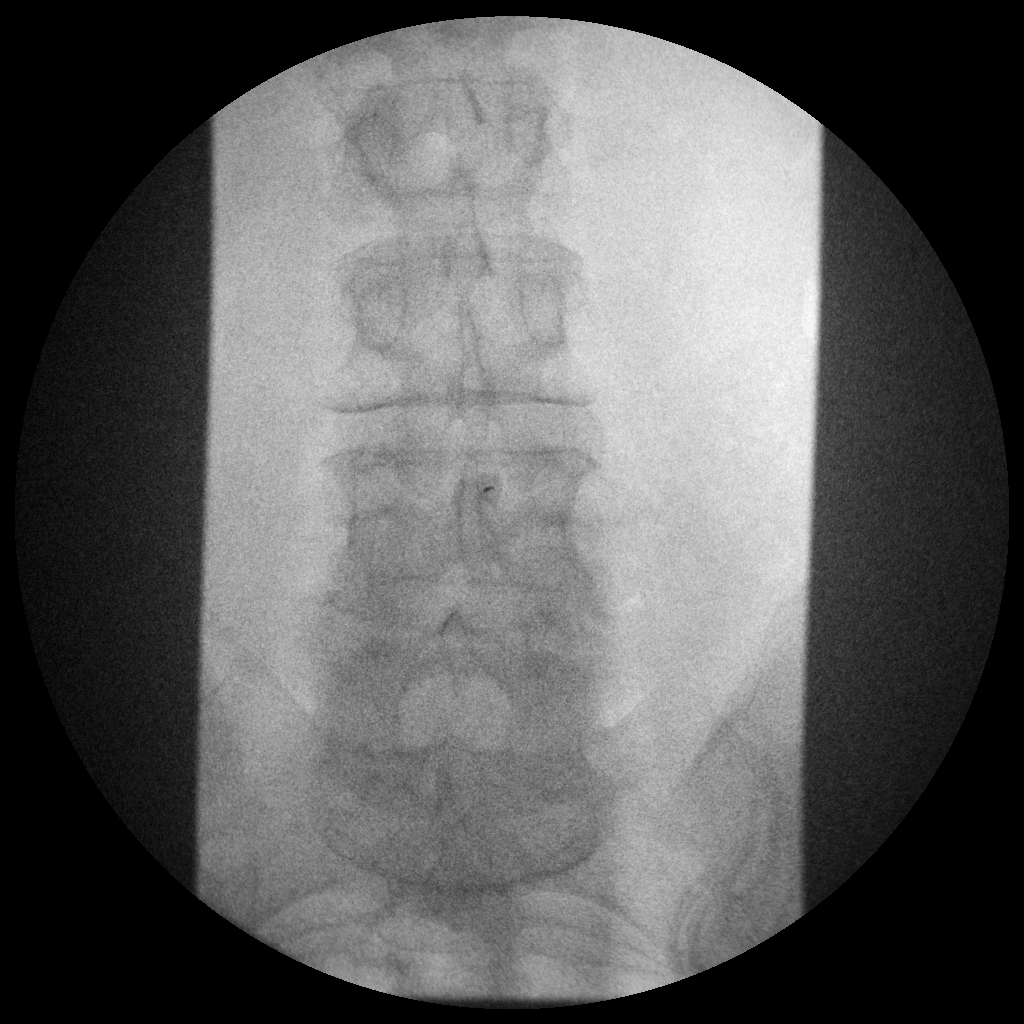

[1 of 1 positions shown; findings below may reference images not displayed]

IMPRESSION: Intrathecal injection of chemotherapy without complication

## 2018-07-07 NOTE — Assessment & Plan Note (Signed)
She is not symptomatic For now, I do not recommend erythropoietin stimulating agent due to her history of cancer

## 2018-07-07 NOTE — Assessment & Plan Note (Signed)
She has chronic kidney disease stage III due to nephrectomy I recommend avoidance of ibuprofen I recommend follow-up with primary care doctor for follow-up

## 2018-07-07 NOTE — Assessment & Plan Note (Signed)
CT imaging show no evidence of cancer recurrence I will repeat imaging study in 6 months

## 2018-07-07 NOTE — Assessment & Plan Note (Signed)
She has not been taking her diuretic therapy for blood pressure I recommend close follow-up with primary care doctor

## 2018-07-07 NOTE — Progress Notes (Signed)
[place] OFFICE PROGRESS NOTE  Patient Care Team: Lemmie Evens, MD as PCP - General (Family Medicine) Danie Binder, MD as Consulting Physician (Gastroenterology) Alyson Ingles Candee Furbish, MD as Consulting Physician (Urology)  ASSESSMENT & PLAN:  Diffuse large B cell lymphoma ([place]) I have reviewed multiple imaging studies with the patient She has no signs of cancer recurrence Per guidelines, I will see her back in 6 months with history, physical examination and imaging study  Cancer of right kidney [facility]) CT imaging show no evidence of cancer recurrence I will repeat imaging study in 6 months  CKD (chronic kidney disease), stage III ([place]) She has chronic kidney disease stage III due to nephrectomy I recommend avoidance of ibuprofen I recommend follow-up with primary care doctor for follow-up  Benign essential HTN She has not been taking her diuretic therapy for blood pressure I recommend close follow-up with primary care doctor  Anemia in chronic kidney disease She is not symptomatic For now, I do not recommend erythropoietin stimulating agent due to her history of cancer   Orders Placed This Encounter  Procedures  . CT Chest Wo Contrast    Standing Status:   Future    Standing Expiration Date:   08/10/2019    Order Specific Question:   Preferred imaging location?    Answer:   [facility]    Order Specific Question:   Radiology Contrast Protocol - do NOT remove file path    Answer:   \\[email or web address]  . CT Abdomen Pelvis Wo Contrast    Standing Status:   Future    Standing Expiration Date:   08/10/2019    Order Specific Question:   Preferred imaging location?    Answer:   [facility]    Order Specific Question:   Is Oral Contrast requested for this exam?    Answer:   Yes, Per Radiology protocol    Order Specific Question:   Radiology Contrast Protocol - do NOT remove file path    Answer:    \\[email or web address]  . Comprehensive metabolic panel    Standing Status:   Future    Standing Expiration Date:   08/10/2019  . CBC with Differential/Platelet    Standing Status:   Future    Standing Expiration Date:   08/10/2019  . Lactate dehydrogenase    Standing Status:   Future    Standing Expiration Date:   08/10/2019    INTERVAL HISTORY: Please see below for problem oriented charting. She returns for further follow-up She complained of minimal fatigue No pain No new lymphadenopathy  SUMMARY OF ONCOLOGIC HISTORY:   Diffuse large B cell lymphoma ([place])   12/12/2016 Imaging    CT scan showed  1. Extensive mesenteric, retroperitoneal, left hilar, mediastinal, left supraclavicular and left axillary adenopathy. Differential considerations include metastatic adenopathy, lymphoma and leukemia. The size of the nodes in the mesentery and retroperitoneum are suggestive of non-Hodgkin's lymphoma. 2. 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 Differential considerations include metastatic disease and lymphoma. A left upper lobe primary lung carcinoma is a possibility. 3. 3.2 cm solid right renal mass. Differential considerations include renal cell carcinoma and oncocytoma. 4. Small amount of free peritoneal fluid, most likely due to lymphatic obstruction by the mesenteric adenopathy. 5. Left fourth rib fracture. 6. Colonic diverticulosis. 7. Calcific coronary artery and aortic atherosclerosis. Aortic Atherosclerosis (ICD10-I70.0).     12/12/2016 - 12/14/2016 Hospital Admission  He was briefly admitted and evaluated for weakness.  CT imaging showed diffuse disease suspicious for lymphoma and outpatient evaluation was arranged     12/18/2016 Imaging    MR thoracic spine Very limited examination demonstrating compression of the cord from approximately mid T6 to T7-8 by epidural tumor eccentric to the  right and surrounding the cord both anteriorly and posteriorly. Tumor fills the right neural foramina at T6-7 and T7-8 and completely replaces the T7 vertebral body.    12/18/2016 Surgery    She underwent procedure: Decompressive thoracic laminectomy from T5-T8 for resection of epidural tumor      12/18/2016 Pathology Results    1. Soft tissue mass, simple excision, thoracic dorsal epidural mass - DIFFUSE LARGE B-CELL LYMPHOMA. - SEE ONCOLOGY TABLE. 2. Soft tissue mass, simple excision, thoracic dorsal epidural mass - DIFFUSE LARGE B-CELL LYMPHOMA. Microscopic Comment 1. LYMPHOMA Histologic type: Non-Hodgkin B-cell lymphoma: Diffuse large B-cell lymphoma. Grade (if applicable): High grade. Flow cytometry: N/A. Immunohistochemical stains: CD20. CD3, CD5, CD10, CD23, CD30, CD15, PAX-5, Ki-67, bcl-2, bcl-6, CD34. Touch preps/imprints: N/A. Comments: There is a diffuse infiltrate of atypical lymphocytes. The lymphocytes are medium to large in size with irregular nuclear contours. There are background smaller lymphocytes. While there are admixed smaller cells, there are diffuse areas of large cells. The large lymphocytes are positive for CD20, CD10, bcl-6, and bcl-2. CD3 and CD5 highlight admixed T-cells. CD34, CD30, CD23, and CD15 are negative. PAX-5 is positive. Ki-67 is variable with areas up to 70%. 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     12/18/2016 - 01/17/2017 Hospital Admission    The patient was readmitted to the hospital due to complete weakness secondary to spinal cord compression.  She underwent surgery and pathology report confirmed diagnosis of diffuse large B-cell lymphoma. She received IT chemo on 01/16/17    01/16/2017 Pathology Results    CEREBROSPINAL FLUID (SPECIMEN 1 OF 1 COLLECTED 01/16/17): NO  MALIGNANT CELLS IDENTIFIED.    02/03/2017 - 02/07/2017 Hospital Admission    She is admitted for cycle 2 of chemo    02/24/2017 - 02/28/2017 Hospital Admission    She is admitted for cycle 3 of chemotherapy    03/13/2017 PET scan    1. Low level FDG uptake associated with previous areas of soft tissue tumor within the chest and abdomen. Deauville criteria 2 and 3 compatible with treated tumor. No sites of residual metabolically active tumor (Deauville criteria 4 or 5) identified. 2. Mild diffuse uptake throughout the bone marrow is noted which is nonspecific in may reflect treatment related changes. 3. Stable size of previously noted complex lesion arising from the posterior cortex of the right kidney which now measures fluid attenuation. This is incompletely characterized on today's given lack of IV contrast material. 4. Aortic Atherosclerosis (ICD10-I70.0). Multi vessel coronary artery calcifications noted.    03/13/2017 Imaging    Study Conclusions  - Procedure narrative: Transthoracic echocardiography. Image quality was suboptimal. The study was technically difficult, as a result of poor acoustic windows, poor sound wave transmission, chest wall deformity, and body habitus. - Left ventricle: GLLS is normal at -19% The cavity size was normal. There was mild focal basal hypertrophy of the septum. Systolic function was vigorous. The estimated ejection fraction was in the range of 65% to 70%. Wall motion was normal; there were no regional wall  motion abnormalities. There was an increased relative contribution of atrial contraction to ventricular filling. Doppler parameters are consistent with abnormal left ventricular relaxation (grade 1 diastolic dysfunction). - Pulmonary arteries: Systolic pressure could not be accurately estimated.    03/24/2017 - 03/28/2017 Hospital Admission    The patient was admitted to the hospital for cycle 4 of chemotherapy    04/21/2017 - 04/25/2017 Hospital Admission    She  is admitted for cycle 5 of R-EPOCH    05/19/2017 - 05/23/2017 Hospital Admission    She is admitted for cycle 6 of R-EPOCH    06/20/2017 Imaging    1. No findings for residual or recurrent lymphoma involving the chest. The left fifth rib is healed and the surrounding extensive soft tissue density has completely resolved. 2. Interval regression of the vague mesenteric and retroperitoneal soft tissue densities and hazy interstitial soft tissue thickening around the mesenteric vessels. No new or enlarging lymph nodes to  suggest recurrent lymphoma. 3. 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 4. Decompressive laminectomies in the midthoracic spine. No findings for recurrent spinal tumor.    07/04/2017 Procedure    Removal of implanted Port-A-Cath utilizing sharp and blunt dissection. The procedure was uncomplicated.    12/25/2017 Imaging    IMPRESSION: Chest Impression:  1. No evidence of lymphoma recurrence in thorax.  Abdomen / Pelvis Impression:  1. No evidence of lymphoma recurrence in the abdomen pelvis. 2. Stable hazy peritoneal fat surrounding the mesenteric vessels. 3. Stable ill-defined nodular density in the LEFT retroperitoneum adjacent to the aorta. 4. Interval increase in size of RIGHT renal neoplasm    07/03/2018 Imaging    No findings suspicious for active lymphoma in the chest, abdomen, or pelvis. Spleen is normal in size.   Stable vague hazy soft tissue in the retroperitoneum/mesentery, possibly reflecting treated lymphoma.  Status post right nephrectomy. No evidence of recurrent or metastatic disease.  Mild ground-glass opacity in the medial right lower lobe, nonspecific, possibly reflecting mild infection.      Cancer of right kidney [facility])   06/11/2018 Surgery    Procedure: 1.  right robot assisted laparoscopic radical nephrectomy  Attending: Nicolette Bang  Specimens: right radical nephrectomy  Findings:  1 renal artery and 1 renal vein. 2cm liver laceration requiring surgicel and surgiflo. The assistant was utilized for retraction, suction, passing instruments, and deploying the stapler cross the renal vein.     06/11/2018 Surgery    Kidney, radical nephrectomy for tumor, right RENAL CELL CARCINOMA, CONVENTIONAL CLEAR CELL TYPE, NUCLEAR GRADE 2 (SIZE 3.6 CM) TUMOR IS LIMITED TO THE KIDNEY (PT1A) LYMPHOVASCULAR INVASION IS IDENTIFIED URETERAL, VASCULAR AND ALL RESECTION MARGINS ARE NEGATIVE FOR TUMOR Microscopic Comment KIDNEY: Procedure: Radical Nephrectomy Specimen Laterality: Right Tumor Size: 3.6 cm Tumor Focality: Focal Histologic Type: Clear cell RCC Sarcomatoid Features: Negative Rhabdoid Features: Negative Histologic Grade: 2 Tumor Necrosis: Negative Tumor Extension: Negative Margins: Negative Regional Lymph Nodes: X No lymph nodes submitted or found Number of Lymph Nodes Involved: NA Number of Lymph Nodes Examined: NA Pathologic Stage Classification (pTNM, AJCC 8th Edition): pT 1a, pNx Pathologic Findings in Nonneoplastic Kidney: Chronic interstitial inflammation    06/26/2018 Cancer Staging    Staging form: Kidney, AJCC 8th Edition - Pathologic: Stage I (pT1a, pN0, cM0) - Signed by Heath Lark, MD on 06/26/2018     REVIEW OF SYSTEMS:   Constitutional: Denies fevers, chills or abnormal weight loss Eyes: Denies blurriness of vision Ears, nose, mouth,  throat, and face: Denies mucositis or sore throat Respiratory: Denies cough, dyspnea or wheezes Cardiovascular: Denies palpitation, chest discomfort or lower extremity swelling Gastrointestinal:  Denies nausea, heartburn or change in bowel habits Skin: Denies abnormal skin rashes Lymphatics: Denies new lymphadenopathy or easy bruising Neurological:Denies numbness, tingling or new weaknesses Behavioral/Psych: Mood is stable, no new changes  All other systems were reviewed with the patient and are negative.  I have  reviewed the past medical history, past surgical history, social history and family history with the patient and they are unchanged from previous note.  ALLERGIES:  is allergic to contrast media [iodinated diagnostic agents].  MEDICATIONS:  Current Outpatient Medications  Medication Sig Dispense Refill  . bisacodyl (DULCOLAX) 5 MG EC tablet Take 15 mg by mouth daily.    . chlorpheniramine (CHLOR-TRIMETON) 4 MG tablet Take 4 mg by mouth daily.    . cyclobenzaprine (FLEXERIL) 10 MG tablet Take 1 tablet (10 mg total) by mouth 3 (three) times daily as needed for muscle spasms. (Patient taking differently: Take 10 mg by mouth at bedtime. ) 90 tablet 1  . HYDROcodone-acetaminophen (NORCO) 5-325 MG tablet Take 1-2 tablets by mouth every 6 (six) hours as needed for moderate pain or severe pain. 30 tablet 0  . hydroxypropyl methylcellulose / hypromellose (ISOPTO TEARS / GONIOVISC) 2.5 % ophthalmic solution Place 1 drop into both eyes 3 (three) times daily as needed (dry/itchy eyes.).    Marland Kitchen triamterene-hydrochlorothiazide (DYAZIDE) 37.5-25 MG capsule Take 1 each (1 capsule total) by mouth daily. 30 capsule 3   No current facility-administered medications for this visit.     PHYSICAL EXAMINATION: ECOG PERFORMANCE STATUS: 0 - Asymptomatic  Vitals:   07/06/18 1126  BP: 132/85  Pulse: 98  Resp: 18  Temp: 98.5 F (36.9 C)  SpO2: 99%   Filed Weights   07/06/18 1126  Weight: 217 lb 6.4 oz (98.6 kg)    GENERAL:alert, no distress and comfortable Musculoskeletal:no cyanosis of digits and no clubbing  NEURO: alert & oriented x 3 with fluent speech, no focal motor/sensory deficits  LABORATORY DATA:  I have reviewed the data as listed    Component Value Date/Time   NA 141 06/26/2018 1217   NA 134 (L) 02/19/2017 0925   K 4.6 06/26/2018 1217   K 3.0 (LL) 02/19/2017 0925   CL 107 06/26/2018 1217   CO2 25 06/26/2018 1217   CO2 29 02/19/2017 0925   GLUCOSE 99 06/26/2018 1217   GLUCOSE 135  02/19/2017 0925   BUN 26 (H) 06/26/2018 1217   BUN 7.4 02/19/2017 0925   CREATININE 1.74 (H) 06/26/2018 1217   CREATININE 0.9 02/19/2017 0925   CALCIUM 9.5 06/26/2018 1217   CALCIUM 8.9 02/19/2017 0925   PROT 7.0 06/26/2018 1217   PROT 6.4 02/19/2017 0925   ALBUMIN 3.8 06/26/2018 1217   ALBUMIN 3.4 (L) 02/19/2017 0925   AST 18 06/26/2018 1217   AST 16 02/19/2017 0925   ALT 19 06/26/2018 1217   ALT 13 02/19/2017 0925   ALKPHOS 69 06/26/2018 1217   ALKPHOS 86 02/19/2017 0925   BILITOT <0.2 (L) 06/26/2018 1217   BILITOT 0.65 02/19/2017 0925   GFRNONAA 30 (L) 06/26/2018 1217   GFRAA 35 (L) 06/26/2018 1217    No results found for: SPEP, UPEP  Lab Results  Component Value Date   WBC 7.9 06/26/2018   NEUTROABS 5.6 06/26/2018   HGB 8.7 (L) 06/26/2018   HCT 27.9 (L) 06/26/2018   MCV 89.1 06/26/2018   PLT  389 06/26/2018      Chemistry      Component Value Date/Time   NA 141 06/26/2018 1217   NA 134 (L) 02/19/2017 0925   K 4.6 06/26/2018 1217   K 3.0 (LL) 02/19/2017 0925   CL 107 06/26/2018 1217   CO2 25 06/26/2018 1217   CO2 29 02/19/2017 0925   BUN 26 (H) 06/26/2018 1217   BUN 7.4 02/19/2017 0925   CREATININE 1.74 (H) 06/26/2018 1217   CREATININE 0.9 02/19/2017 0925      Component Value Date/Time   CALCIUM 9.5 06/26/2018 1217   CALCIUM 8.9 02/19/2017 0925   ALKPHOS 69 06/26/2018 1217   ALKPHOS 86 02/19/2017 0925   AST 18 06/26/2018 1217   AST 16 02/19/2017 0925   ALT 19 06/26/2018 1217   ALT 13 02/19/2017 0925   BILITOT <0.2 (L) 06/26/2018 1217   BILITOT 0.65 02/19/2017 0925       RADIOGRAPHIC STUDIES: I have reviewed multiple imaging studies with the patient I have personally reviewed the radiological images as listed and agreed with the findings in the report. Ct Abdomen Pelvis Wo Contrast  Result Date: 07/03/2018 CLINICAL DATA:  B-cell lymphoma, status post chemotherapy. New right renal cell cancer, status post right nephrectomy. Prior hysterectomy and  appendectomy. Right abdominal pain. EXAM: CT CHEST, ABDOMEN AND PELVIS WITHOUT CONTRAST TECHNIQUE: Multidetector CT imaging of the chest, abdomen and pelvis was performed following the standard protocol without IV contrast. COMPARISON:  12/25/2017 FINDINGS: CT CHEST FINDINGS Cardiovascular: Heart is normal in size.  No pericardial effusion. No evidence of thoracic aortic aneurysm. Mild atherosclerotic calcifications of the aortic arch. Mild three-vessel coronary atherosclerosis. Mediastinum/Nodes: No suspicious mediastinal or axillary lymphadenopathy. Visualized thyroid is unremarkable. Lungs/Pleura: No suspicious pulmonary nodules. Mild ground-glass opacity in the medial right lower lobe (series 4/image 69), nonspecific, possibly reflecting mild infection. Additional compressive atelectasis in the medial right lower lobe (series 4/image 100). No focal consolidation. No pleural effusion or pneumothorax. Musculoskeletal: Degenerative changes of the thoracic spine. CT ABDOMEN PELVIS FINDINGS Hepatobiliary: Unenhanced liver is grossly unremarkable. Gallbladder is unremarkable. No intrahepatic or extrahepatic ductal dilatation. Pancreas: Within normal limits. Spleen: Spleen is normal in size. Adrenals/Urinary Tract: Adrenal glands are within normal limits. Status post right nephrectomy. No abnormal soft tissue in the surgical bed. Left kidney is within normal limits. No renal, ureteral, or bladder calculi. No hydronephrosis. Bladder is within normal limits. Stomach/Bowel: Stomach is within normal limits. No evidence of bowel obstruction. Prior appendectomy. Sigmoid diverticulosis, without evidence of diverticulitis. Vascular/Lymphatic: No evidence of abdominal aortic aneurysm. Atherosclerotic calcifications of the abdominal aorta and branch vessels. No suspicious abdominopelvic lymphadenopathy. Stable vague hazy soft tissue in the left para-aortic region (series 2/image 32), in the left mid abdominal mesentery (series  2/image 83), and extending into the right lower quadrant/ileocecal mesentery (series 2/image 95). Reproductive: Status post hysterectomy. No adnexal masses. Other: No abdominopelvic ascites. Musculoskeletal: Visualized osseous structures are within normal limits. IMPRESSION: No findings suspicious for active lymphoma in the chest, abdomen, or pelvis. Spleen is normal in size. Stable vague hazy soft tissue in the retroperitoneum/mesentery, possibly reflecting treated lymphoma. Status post right nephrectomy. No evidence of recurrent or metastatic disease. Mild ground-glass opacity in the medial right lower lobe, nonspecific, possibly reflecting mild infection. Electronically Signed   By: Julian Hy M.D.   On: 07/03/2018 16:59   Ct Chest Wo Contrast  Result Date: 07/03/2018 CLINICAL DATA:  B-cell lymphoma, status post chemotherapy. New right renal cell cancer, status post right nephrectomy.  Prior hysterectomy and appendectomy. Right abdominal pain. EXAM: CT CHEST, ABDOMEN AND PELVIS WITHOUT CONTRAST TECHNIQUE: Multidetector CT imaging of the chest, abdomen and pelvis was performed following the standard protocol without IV contrast. COMPARISON:  12/25/2017 FINDINGS: CT CHEST FINDINGS Cardiovascular: Heart is normal in size.  No pericardial effusion. No evidence of thoracic aortic aneurysm. Mild atherosclerotic calcifications of the aortic arch. Mild three-vessel coronary atherosclerosis. Mediastinum/Nodes: No suspicious mediastinal or axillary lymphadenopathy. Visualized thyroid is unremarkable. Lungs/Pleura: No suspicious pulmonary nodules. Mild ground-glass opacity in the medial right lower lobe (series 4/image 69), nonspecific, possibly reflecting mild infection. Additional compressive atelectasis in the medial right lower lobe (series 4/image 100). No focal consolidation. No pleural effusion or pneumothorax. Musculoskeletal: Degenerative changes of the thoracic spine. CT ABDOMEN PELVIS FINDINGS  Hepatobiliary: Unenhanced liver is grossly unremarkable. Gallbladder is unremarkable. No intrahepatic or extrahepatic ductal dilatation. Pancreas: Within normal limits. Spleen: Spleen is normal in size. Adrenals/Urinary Tract: Adrenal glands are within normal limits. Status post right nephrectomy. No abnormal soft tissue in the surgical bed. Left kidney is within normal limits. No renal, ureteral, or bladder calculi. No hydronephrosis. Bladder is within normal limits. Stomach/Bowel: Stomach is within normal limits. No evidence of bowel obstruction. Prior appendectomy. Sigmoid diverticulosis, without evidence of diverticulitis. Vascular/Lymphatic: No evidence of abdominal aortic aneurysm. Atherosclerotic calcifications of the abdominal aorta and branch vessels. No suspicious abdominopelvic lymphadenopathy. Stable vague hazy soft tissue in the left para-aortic region (series 2/image 32), in the left mid abdominal mesentery (series 2/image 83), and extending into the right lower quadrant/ileocecal mesentery (series 2/image 95). Reproductive: Status post hysterectomy. No adnexal masses. Other: No abdominopelvic ascites. Musculoskeletal: Visualized osseous structures are within normal limits. IMPRESSION: No findings suspicious for active lymphoma in the chest, abdomen, or pelvis. Spleen is normal in size. Stable vague hazy soft tissue in the retroperitoneum/mesentery, possibly reflecting treated lymphoma. Status post right nephrectomy. No evidence of recurrent or metastatic disease. Mild ground-glass opacity in the medial right lower lobe, nonspecific, possibly reflecting mild infection. Electronically Signed   By: Julian Hy M.D.   On: 07/03/2018 16:59    All questions were answered. The patient knows to call the clinic with any problems, questions or concerns. No barriers to learning was detected.  I spent 15 minutes counseling the patient face to face. The total time spent in the appointment was 20 minutes  and more than 50% was on counseling and review of test results  Heath Lark, MD 07/07/2018 10:50 AM

## 2018-07-07 NOTE — Assessment & Plan Note (Signed)
I have reviewed multiple imaging studies with the patient She has no signs of cancer recurrence Per guidelines, I will see her back in 6 months with history, physical examination and imaging study

## 2018-07-08 ENCOUNTER — Telehealth: Payer: Self-pay | Admitting: Hematology and Oncology

## 2018-07-08 NOTE — Telephone Encounter (Signed)
Tried to reach regarding schedule °

## 2018-09-02 ENCOUNTER — Ambulatory Visit (INDEPENDENT_AMBULATORY_CARE_PROVIDER_SITE_OTHER): Payer: Medicare Other | Admitting: Urology

## 2018-09-02 DIAGNOSIS — C641 Malignant neoplasm of right kidney, except renal pelvis: Secondary | ICD-10-CM

## 2018-12-09 ENCOUNTER — Ambulatory Visit: Payer: Medicare Other | Admitting: Urology

## 2019-01-04 ENCOUNTER — Encounter (HOSPITAL_COMMUNITY): Payer: Self-pay

## 2019-01-04 ENCOUNTER — Other Ambulatory Visit: Payer: Self-pay

## 2019-01-04 ENCOUNTER — Ambulatory Visit (HOSPITAL_COMMUNITY)
Admission: RE | Admit: 2019-01-04 | Discharge: 2019-01-04 | Disposition: A | Discharge status: Home / Self Care | Payer: Medicare Other | Source: Ambulatory Visit | Attending: Hematology and Oncology | Admitting: Hematology and Oncology

## 2019-01-04 ENCOUNTER — Inpatient Hospital Stay: Payer: Medicare Other | Attending: Hematology and Oncology

## 2019-01-04 DIAGNOSIS — I129 Hypertensive chronic kidney disease with stage 1 through stage 4 chronic kidney disease, or unspecified chronic kidney disease: Secondary | ICD-10-CM | POA: Diagnosis not present

## 2019-01-04 DIAGNOSIS — C641 Malignant neoplasm of right kidney, except renal pelvis: Secondary | ICD-10-CM | POA: Diagnosis present

## 2019-01-04 DIAGNOSIS — C8330 Diffuse large B-cell lymphoma, unspecified site: Secondary | ICD-10-CM | POA: Diagnosis present

## 2019-01-04 DIAGNOSIS — Z85528 Personal history of other malignant neoplasm of kidney: Secondary | ICD-10-CM | POA: Insufficient documentation

## 2019-01-04 DIAGNOSIS — N189 Chronic kidney disease, unspecified: Secondary | ICD-10-CM | POA: Insufficient documentation

## 2019-01-04 DIAGNOSIS — Z8572 Personal history of non-Hodgkin lymphomas: Secondary | ICD-10-CM | POA: Diagnosis present

## 2019-01-04 HISTORY — DX: Malignant neoplasm of right kidney, except renal pelvis: C64.1

## 2019-01-04 LAB — COMPREHENSIVE METABOLIC PANEL
ALT: 14 U/L (ref 0–44)
AST: 15 U/L (ref 15–41)
Albumin: 4.2 g/dL (ref 3.5–5.0)
Alkaline Phosphatase: 88 U/L (ref 38–126)
Anion gap: 10 (ref 5–15)
BUN: 31 mg/dL — ABNORMAL HIGH (ref 8–23)
CO2: 23 mmol/L (ref 22–32)
Calcium: 9.6 mg/dL (ref 8.9–10.3)
Chloride: 108 mmol/L (ref 98–111)
Creatinine, Ser: 1.56 mg/dL — ABNORMAL HIGH (ref 0.44–1.00)
GFR calc Af Amer: 40 mL/min — ABNORMAL LOW (ref >60)
GFR calc non Af Amer: 34 mL/min — ABNORMAL LOW (ref >60)
Glucose, Bld: 91 mg/dL (ref 70–99)
Potassium: 4.1 mmol/L (ref 3.5–5.1)
Sodium: 141 mmol/L (ref 135–145)
Total Bilirubin: 0.2 mg/dL — ABNORMAL LOW (ref 0.3–1.2)
Total Protein: 7.6 g/dL (ref 6.5–8.1)

## 2019-01-04 LAB — CBC WITH DIFFERENTIAL/PLATELET
Abs Immature Granulocytes: 0.03 10*3/uL (ref 0.00–0.07)
Basophils Absolute: 0 10*3/uL (ref 0.0–0.1)
Basophils Relative: 0 %
Eosinophils Absolute: 0.2 10*3/uL (ref 0.0–0.5)
Eosinophils Relative: 2 %
HCT: 34.5 % — ABNORMAL LOW (ref 36.0–46.0)
Hemoglobin: 11.2 g/dL — ABNORMAL LOW (ref 12.0–15.0)
Immature Granulocytes: 0 %
Lymphocytes Relative: 17 %
Lymphs Abs: 1.3 10*3/uL (ref 0.7–4.0)
MCH: 28 pg (ref 26.0–34.0)
MCHC: 32.5 g/dL (ref 30.0–36.0)
MCV: 86.3 fL (ref 80.0–100.0)
Monocytes Absolute: 0.7 10*3/uL (ref 0.1–1.0)
Monocytes Relative: 9 %
Neutro Abs: 5.7 10*3/uL (ref 1.7–7.7)
Neutrophils Relative %: 72 %
Platelets: 272 10*3/uL (ref 150–400)
RBC: 4 MIL/uL (ref 3.87–5.11)
RDW: 14.5 % (ref 11.5–15.5)
WBC: 7.9 10*3/uL (ref 4.0–10.5)
nRBC: 0 % (ref 0.0–0.2)

## 2019-01-04 LAB — LACTATE DEHYDROGENASE: LDH: 125 U/L (ref 98–192)

## 2019-01-05 ENCOUNTER — Inpatient Hospital Stay (HOSPITAL_BASED_OUTPATIENT_CLINIC_OR_DEPARTMENT_OTHER): Payer: Medicare Other | Admitting: Hematology and Oncology

## 2019-01-05 DIAGNOSIS — Z8572 Personal history of non-Hodgkin lymphomas: Secondary | ICD-10-CM | POA: Diagnosis not present

## 2019-01-05 DIAGNOSIS — N183 Chronic kidney disease, stage 3 unspecified: Secondary | ICD-10-CM | POA: Diagnosis not present

## 2019-01-05 DIAGNOSIS — C8330 Diffuse large B-cell lymphoma, unspecified site: Secondary | ICD-10-CM

## 2019-01-05 DIAGNOSIS — I10 Essential (primary) hypertension: Secondary | ICD-10-CM

## 2019-01-05 DIAGNOSIS — C641 Malignant neoplasm of right kidney, except renal pelvis: Secondary | ICD-10-CM

## 2019-01-06 ENCOUNTER — Ambulatory Visit (INDEPENDENT_AMBULATORY_CARE_PROVIDER_SITE_OTHER): Payer: Medicare Other | Admitting: Urology

## 2019-01-06 ENCOUNTER — Telehealth: Payer: Self-pay | Admitting: Hematology and Oncology

## 2019-01-06 ENCOUNTER — Encounter: Payer: Self-pay | Admitting: Hematology and Oncology

## 2019-01-06 DIAGNOSIS — C641 Malignant neoplasm of right kidney, except renal pelvis: Secondary | ICD-10-CM | POA: Diagnosis not present

## 2019-01-06 NOTE — Assessment & Plan Note (Signed)
Her blood pressure is poorly controlled I am concerned about her solitary kidney state and risk factors of progressive renal failure with uncontrolled hypertension I recommend close follow-up with primary care doctor, lifestyle modification and dietary changes

## 2019-01-06 NOTE — Telephone Encounter (Signed)
Scheduled appt per 11/18 sch message - mailed reminder letter with appt date and time

## 2019-01-06 NOTE — Assessment & Plan Note (Signed)
She have no signs of cancer recurrence

## 2019-01-06 NOTE — Assessment & Plan Note (Signed)
She has chronic kidney disease stage III due to nephrectomy I recommend aggressive blood pressure management I recommend follow-up with primary care doctor for follow-up 

## 2019-01-06 NOTE — Assessment & Plan Note (Signed)
I have reviewed multiple imaging studies with the patient She has no signs of cancer recurrence Per guidelines, I will see her back in 6 months with history, physical examination  I am concerned about her chronic renal failure and exposure to radio iodine contrast in view of chronic kidney disease and solitary kidney I recommend defer imaging study until late next year and she agreed

## 2019-01-06 NOTE — Progress Notes (Signed)
[facility] OFFICE PROGRESS NOTE  Patient Care Team: Lemmie Evens, MD as PCP - General (Family Medicine) Danie Binder, MD as Consulting Physician (Gastroenterology) Alyson Ingles Candee Furbish, MD as Consulting Physician (Urology)  ASSESSMENT & PLAN:  Diffuse large B cell lymphoma ([name]) I have reviewed multiple imaging studies with the patient She has no signs of cancer recurrence Per guidelines, I will see her back in 6 months with history, physical examination  I am concerned about her chronic renal failure and exposure to radio iodine contrast in view of chronic kidney disease and solitary kidney I recommend defer imaging study until late next year and she agreed  Cancer of right kidney [facility]) She have no signs of cancer recurrence  Benign essential HTN Her blood pressure is poorly controlled I am concerned about her solitary kidney state and risk factors of progressive renal failure with uncontrolled hypertension I recommend close follow-up with primary care doctor, lifestyle modification and dietary changes  CKD (chronic kidney disease), stage III ([place]) She has chronic kidney disease stage III due to nephrectomy I recommend aggressive blood pressure management I recommend follow-up with primary care doctor for follow-up   No orders of the defined types were placed in this encounter.   INTERVAL HISTORY: Please see below for problem oriented charting. She returns for further follow-up She is doing well No new lymphadenopathy Appetite is stable Denies abnormal weight loss.  In fact, she has gained some weight She noticed that her blood pressure monitoring at home is quite high  SUMMARY OF ONCOLOGIC HISTORY: Oncology History  Diffuse large B cell lymphoma ([place])  12/12/2016 Imaging   CT scan showed  1. Extensive mesenteric, retroperitoneal, left hilar, mediastinal, left supraclavicular and left axillary adenopathy. Differential considerations include  metastatic adenopathy, lymphoma and leukemia. The size of the nodes in the mesentery and retroperitoneum are suggestive of non-Hodgkin's lymphoma. 2. 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 Differential considerations include metastatic disease and lymphoma. A left upper lobe primary lung carcinoma is a possibility. 3. 3.2 cm solid right renal mass. Differential considerations include renal cell carcinoma and oncocytoma. 4. Small amount of free peritoneal fluid, most likely due to lymphatic obstruction by the mesenteric adenopathy. 5. Left fourth rib fracture. 6. Colonic diverticulosis. 7. Calcific coronary artery and aortic atherosclerosis. Aortic Atherosclerosis (ICD10-I70.0).    12/12/2016 - 12/14/2016 Hospital Admission   He was briefly admitted and evaluated for weakness.  CT imaging showed diffuse disease suspicious for lymphoma and outpatient evaluation was arranged    12/18/2016 Imaging   MR thoracic spine Very limited examination demonstrating compression of the cord from approximately mid T6 to T7-8 by epidural tumor eccentric to the right and surrounding the cord both anteriorly and posteriorly. Tumor fills the right neural foramina at T6-7 and T7-8 and completely replaces the T7 vertebral body.   12/18/2016 Surgery   She underwent procedure: Decompressive thoracic laminectomy from T5-T8 for resection of epidural tumor     12/18/2016 Pathology Results   1. Soft tissue mass, simple excision, thoracic dorsal epidural mass - DIFFUSE LARGE B-CELL LYMPHOMA. - SEE ONCOLOGY TABLE. 2. Soft tissue mass, simple excision, thoracic dorsal epidural mass - DIFFUSE LARGE B-CELL LYMPHOMA. Microscopic Comment 1. LYMPHOMA Histologic type: Non-Hodgkin B-cell lymphoma: Diffuse large B-cell lymphoma. Grade (if applicable): High grade. Flow cytometry: N/A. Immunohistochemical stains: CD20. CD3, CD5, CD10, CD23, CD30, CD15,  PAX-5, Ki-67, bcl-2, bcl-6, CD34. Touch preps/imprints: N/A. Comments: There is  a diffuse infiltrate of atypical lymphocytes. The lymphocytes are medium to large in size with irregular nuclear contours. There are background smaller lymphocytes. While there are admixed smaller cells, there are diffuse areas of large cells. The large lymphocytes are positive for CD20, CD10, bcl-6, and bcl-2. CD3 and CD5 highlight admixed T-cells. CD34, CD30, CD23, and CD15 are negative. PAX-5 is positive. Ki-67 is variable with areas up to 70%. 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    12/18/2016 - 01/17/2017 Hospital Admission   The patient was readmitted to the hospital due to complete weakness secondary to spinal cord compression.  She underwent surgery and pathology report confirmed diagnosis of diffuse large B-cell lymphoma. She received IT chemo on 01/16/17   01/16/2017 Pathology Results   CEREBROSPINAL FLUID (SPECIMEN 1 OF 1 COLLECTED 01/16/17): NO MALIGNANT CELLS IDENTIFIED.   02/03/2017 - 02/07/2017 Hospital Admission   She is admitted for cycle 2 of chemo   02/24/2017 - 02/28/2017 Hospital Admission   She is admitted for cycle 3 of chemotherapy   03/13/2017 PET scan   1. Low level FDG uptake associated with previous areas of soft tissue tumor within the chest and abdomen. Deauville criteria 2 and 3 compatible with treated tumor. No sites of residual metabolically active tumor (Deauville criteria 4 or 5) identified. 2. Mild diffuse uptake throughout the bone marrow is noted which is nonspecific in may reflect treatment related changes. 3. Stable size of previously noted complex lesion arising from the posterior cortex of the right kidney which now measures fluid attenuation. This is incompletely characterized on today's given lack of  IV contrast material. 4. Aortic Atherosclerosis (ICD10-I70.0). Multi vessel coronary artery calcifications noted.   03/13/2017 Imaging   Study Conclusions  - Procedure narrative: Transthoracic echocardiography. Image quality was suboptimal. The study was technically difficult, as a result of poor acoustic windows, poor sound wave transmission, chest wall deformity, and body habitus. - Left ventricle: GLLS is normal at -19% The cavity size was normal. There was mild focal basal hypertrophy of the septum. Systolic function was vigorous. The estimated ejection fraction was in the range of 65% to 70%. Wall motion was normal; there were no regional wall motion abnormalities. There was an increased relative contribution of atrial contraction to ventricular filling. Doppler parameters are consistent with abnormal left ventricular relaxation (grade 1 diastolic dysfunction). - Pulmonary arteries: Systolic pressure could not be accurately estimated.   03/24/2017 - 03/28/2017 Hospital Admission   The patient was admitted to the hospital for cycle 4 of chemotherapy   04/21/2017 - 04/25/2017 Hospital Admission   She is admitted for cycle 5 of R-EPOCH   05/19/2017 - 05/23/2017 Hospital Admission   She is admitted for cycle 6 of R-EPOCH   06/20/2017 Imaging   1. No findings for residual or recurrent lymphoma involving the chest. The left fifth rib is healed and the surrounding extensive soft tissue density has completely resolved. 2. Interval regression of the vague mesenteric and retroperitoneal soft tissue densities and hazy interstitial soft tissue thickening around the mesenteric vessels. No new or enlarging lymph nodes to  suggest recurrent lymphoma. 3. 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 4. Decompressive laminectomies in the midthoracic spine. No findings for recurrent spinal tumor.   07/04/2017 Procedure   Removal of implanted Port-A-Cath utilizing sharp and  blunt dissection. The procedure was uncomplicated.   12/25/2017 Imaging   IMPRESSION: Chest Impression:  1. No evidence of lymphoma recurrence in thorax.  Abdomen / Pelvis Impression:  1. No evidence of lymphoma recurrence in the abdomen pelvis. 2. Stable hazy peritoneal fat surrounding the mesenteric vessels. 3. Stable ill-defined nodular density in the LEFT retroperitoneum adjacent to the aorta. 4. Interval increase in size of RIGHT renal neoplasm   07/03/2018 Imaging   No findings suspicious for active lymphoma in the chest, abdomen, or pelvis. Spleen is normal in size.   Stable vague hazy soft tissue in the retroperitoneum/mesentery, possibly reflecting treated lymphoma.  Status post right nephrectomy. No evidence of recurrent or metastatic disease.  Mild ground-glass opacity in the medial right lower lobe, nonspecific, possibly reflecting mild infection.    01/04/2019 Imaging   1. No evidence of recurrent lymphoma. 2. Aortic atherosclerosis (ICD10-170.0). Coronary artery calcification. 3. Enlarged pulmonic trunk, indicative of pulmonary arterial hypertension   Cancer of right kidney ([place])  06/11/2018 Surgery   Procedure: 1.  right robot assisted laparoscopic radical nephrectomy  Attending: Nicolette Bang  Specimens: right radical nephrectomy  Findings: 1 renal artery and 1 renal vein. 2cm liver laceration requiring surgicel and surgiflo. The assistant was utilized for retraction, suction, passing instruments, and deploying the stapler cross the renal vein.    06/11/2018 Surgery   Kidney, radical nephrectomy for tumor, right RENAL CELL CARCINOMA, CONVENTIONAL CLEAR CELL TYPE, NUCLEAR GRADE 2 (SIZE 3.6 CM) TUMOR IS LIMITED TO THE KIDNEY (PT1A) LYMPHOVASCULAR INVASION IS IDENTIFIED URETERAL, VASCULAR AND ALL RESECTION MARGINS ARE NEGATIVE FOR TUMOR Microscopic Comment KIDNEY: Procedure: Radical Nephrectomy Specimen Laterality: Right Tumor Size: 3.6  cm Tumor Focality: Focal Histologic Type: Clear cell RCC Sarcomatoid Features: Negative Rhabdoid Features: Negative Histologic Grade: 2 Tumor Necrosis: Negative Tumor Extension: Negative Margins: Negative Regional Lymph Nodes: X No lymph nodes submitted or found Number of Lymph Nodes Involved: NA Number of Lymph Nodes Examined: NA Pathologic Stage Classification (pTNM, AJCC 8th Edition): pT 1a, pNx Pathologic Findings in Nonneoplastic Kidney: Chronic interstitial inflammation   06/26/2018 Cancer Staging   Staging form: Kidney, AJCC 8th Edition - Pathologic: Stage I (pT1a, pN0, cM0) - Signed by Heath Lark, MD on 06/26/2018     REVIEW OF SYSTEMS:   Constitutional: Denies fevers, chills or abnormal weight loss Eyes: Denies blurriness of vision Ears, nose, mouth, throat, and face: Denies mucositis or sore throat Respiratory: Denies cough, dyspnea or wheezes Cardiovascular: Denies palpitation, chest discomfort or lower extremity swelling Gastrointestinal:  Denies nausea, heartburn or change in bowel habits Skin: Denies abnormal skin rashes Lymphatics: Denies new lymphadenopathy or easy bruising Neurological:Denies numbness, tingling or new weaknesses Behavioral/Psych: Mood is stable, no new changes  All other systems were reviewed with the patient and are negative.  I have reviewed the past medical history, past surgical history, social history and family history with the patient and they are unchanged from previous note.  ALLERGIES:  is allergic to contrast media [iodinated diagnostic agents].  MEDICATIONS:  Current Outpatient Medications  Medication Sig Dispense Refill  . bisacodyl (DULCOLAX) 5 MG EC tablet Take 15 mg by mouth daily.    . chlorpheniramine (CHLOR-TRIMETON) 4 MG tablet Take 4 mg by mouth daily.    . cyclobenzaprine (FLEXERIL) 10 MG tablet Take 1 tablet (10 mg total) by mouth 3 (three) times daily as needed for muscle spasms. (Patient taking differently: Take 10 mg  by mouth at bedtime. ) 90 tablet 1  . HYDROcodone-acetaminophen (NORCO) 5-325 MG  tablet Take 1-2 tablets by mouth every 6 (six) hours as needed for moderate pain or severe pain. 30 tablet 0  . hydroxypropyl methylcellulose / hypromellose (ISOPTO TEARS / GONIOVISC) 2.5 % ophthalmic solution Place 1 drop into both eyes 3 (three) times daily as needed (dry/itchy eyes.).    Marland Kitchen triamterene-hydrochlorothiazide (DYAZIDE) 37.5-25 MG capsule Take 1 each (1 capsule total) by mouth daily. 30 capsule 3   No current facility-administered medications for this visit.     PHYSICAL EXAMINATION: ECOG PERFORMANCE STATUS: 0 - Asymptomatic  Vitals:   01/05/19 1224 01/05/19 1225  BP: (!) 161/97 129/80  Pulse: 80   Resp: 17   Temp: 98.2 F (36.8 C)   SpO2: 99%    Filed Weights   01/05/19 1224  Weight: 223 lb 1.6 oz (101.2 kg)    GENERAL:alert, no distress and comfortable SKIN: skin color, texture, turgor are normal, no rashes or significant lesions EYES: normal, Conjunctiva are pink and non-injected, sclera clear OROPHARYNX:no exudate, no erythema and lips, buccal mucosa, and tongue normal  NECK: supple, thyroid normal size, non-tender, without nodularity LYMPH:  no palpable lymphadenopathy in the cervical, axillary or inguinal LUNGS: clear to auscultation and percussion with normal breathing effort HEART: regular rate & rhythm and no murmurs and no lower extremity edema ABDOMEN:abdomen soft, non-tender and normal bowel sounds Musculoskeletal:no cyanosis of digits and no clubbing  NEURO: alert & oriented x 3 with fluent speech, no focal motor/sensory deficits  LABORATORY DATA:  I have reviewed the data as listed    Component Value Date/Time   NA 141 01/04/2019 1058   NA 134 (L) 02/19/2017 0925   K 4.1 01/04/2019 1058   K 3.0 (LL) 02/19/2017 0925   CL 108 01/04/2019 1058   CO2 23 01/04/2019 1058   CO2 29 02/19/2017 0925   GLUCOSE 91 01/04/2019 1058   GLUCOSE 135 02/19/2017 0925   BUN 31 (H)  01/04/2019 1058   BUN 7.4 02/19/2017 0925   CREATININE 1.56 (H) 01/04/2019 1058   CREATININE 0.9 02/19/2017 0925   CALCIUM 9.6 01/04/2019 1058   CALCIUM 8.9 02/19/2017 0925   PROT 7.6 01/04/2019 1058   PROT 6.4 02/19/2017 0925   ALBUMIN 4.2 01/04/2019 1058   ALBUMIN 3.4 (L) 02/19/2017 0925   AST 15 01/04/2019 1058   AST 16 02/19/2017 0925   ALT 14 01/04/2019 1058   ALT 13 02/19/2017 0925   ALKPHOS 88 01/04/2019 1058   ALKPHOS 86 02/19/2017 0925   BILITOT 0.2 (L) 01/04/2019 1058   BILITOT 0.65 02/19/2017 0925   GFRNONAA 34 (L) 01/04/2019 1058   GFRAA 40 (L) 01/04/2019 1058    No results found for: SPEP, UPEP  Lab Results  Component Value Date   WBC 7.9 01/04/2019   NEUTROABS 5.7 01/04/2019   HGB 11.2 (L) 01/04/2019   HCT 34.5 (L) 01/04/2019   MCV 86.3 01/04/2019   PLT 272 01/04/2019      Chemistry      Component Value Date/Time   NA 141 01/04/2019 1058   NA 134 (L) 02/19/2017 0925   K 4.1 01/04/2019 1058   K 3.0 (LL) 02/19/2017 0925   CL 108 01/04/2019 1058   CO2 23 01/04/2019 1058   CO2 29 02/19/2017 0925   BUN 31 (H) 01/04/2019 1058   BUN 7.4 02/19/2017 0925   CREATININE 1.56 (H) 01/04/2019 1058   CREATININE 0.9 02/19/2017 0925      Component Value Date/Time   CALCIUM 9.6 01/04/2019 1058   CALCIUM 8.9  02/19/2017 0925   ALKPHOS 88 01/04/2019 1058   ALKPHOS 86 02/19/2017 0925   AST 15 01/04/2019 1058   AST 16 02/19/2017 0925   ALT 14 01/04/2019 1058   ALT 13 02/19/2017 0925   BILITOT 0.2 (L) 01/04/2019 1058   BILITOT 0.65 02/19/2017 0925       RADIOGRAPHIC STUDIES: I have personally reviewed the radiological images as listed and agreed with the findings in the report. Ct Abdomen Pelvis Wo Contrast  Result Date: 01/04/2019 CLINICAL DATA:  B-cell lymphoma and right renal cell carcinoma. Mid abdominal pain. EXAM: CT CHEST, ABDOMEN AND PELVIS WITHOUT CONTRAST TECHNIQUE: Multidetector CT imaging of the chest, abdomen and pelvis was performed following  the standard protocol without IV contrast. COMPARISON:  07/03/2018. FINDINGS: CT CHEST FINDINGS Cardiovascular: Atherosclerotic calcification of the aorta and coronary arteries. Pulmonic trunk is enlarged. Heart size normal. No pericardial effusion. Mediastinum/Nodes: No pathologically enlarged mediastinal or axillary lymph nodes. Hilar regions are difficult to definitively evaluate without IV contrast but appear grossly unremarkable. Esophagus is grossly unremarkable. Lungs/Pleura: Minimal peripheral peribronchovascular nodularity in the right upper lobe (6/42), likely benign. Paraspinal scarring in the medial right lower lobe. No pleural fluid. Airway is unremarkable. Musculoskeletal: Degenerative changes in the spine. No worrisome lytic or sclerotic lesions. CT ABDOMEN PELVIS FINDINGS Hepatobiliary: Liver and gallbladder are unremarkable. No biliary ductal dilatation. Pancreas: Negative. Spleen: Negative. Adrenals/Urinary Tract: Adrenal glands are unremarkable. Right nephrectomy. Left kidney is unremarkable. Stomach/Bowel: Stomach, small bowel and colon are unremarkable. Appendix is not readily visualized. Vascular/Lymphatic: Vascular structures are unremarkable. Hazy lymph nodes in the left periaortic station measure up to 8 mm, stable. Reproductive: Hysterectomy.  No adnexal mass. Other: No free fluid. Haziness and thickening in the small bowel mesentery appear unchanged. There are small subcentimeter short axis mesenteric nodules in association, as before. Musculoskeletal: No worrisome lytic or sclerotic lesions. IMPRESSION: 1. No evidence of recurrent lymphoma. 2. Aortic atherosclerosis (ICD10-170.0). Coronary artery calcification. 3. Enlarged pulmonic trunk, indicative of pulmonary arterial hypertension. Electronically Signed   By: Lorin Picket M.D.   On: 01/04/2019 14:51   Ct Chest Wo Contrast  Result Date: 01/04/2019 CLINICAL DATA:  B-cell lymphoma and right renal cell carcinoma. Mid abdominal  pain. EXAM: CT CHEST, ABDOMEN AND PELVIS WITHOUT CONTRAST TECHNIQUE: Multidetector CT imaging of the chest, abdomen and pelvis was performed following the standard protocol without IV contrast. COMPARISON:  07/03/2018. FINDINGS: CT CHEST FINDINGS Cardiovascular: Atherosclerotic calcification of the aorta and coronary arteries. Pulmonic trunk is enlarged. Heart size normal. No pericardial effusion. Mediastinum/Nodes: No pathologically enlarged mediastinal or axillary lymph nodes. Hilar regions are difficult to definitively evaluate without IV contrast but appear grossly unremarkable. Esophagus is grossly unremarkable. Lungs/Pleura: Minimal peripheral peribronchovascular nodularity in the right upper lobe (6/42), likely benign. Paraspinal scarring in the medial right lower lobe. No pleural fluid. Airway is unremarkable. Musculoskeletal: Degenerative changes in the spine. No worrisome lytic or sclerotic lesions. CT ABDOMEN PELVIS FINDINGS Hepatobiliary: Liver and gallbladder are unremarkable. No biliary ductal dilatation. Pancreas: Negative. Spleen: Negative. Adrenals/Urinary Tract: Adrenal glands are unremarkable. Right nephrectomy. Left kidney is unremarkable. Stomach/Bowel: Stomach, small bowel and colon are unremarkable. Appendix is not readily visualized. Vascular/Lymphatic: Vascular structures are unremarkable. Hazy lymph nodes in the left periaortic station measure up to 8 mm, stable. Reproductive: Hysterectomy.  No adnexal mass. Other: No free fluid. Haziness and thickening in the small bowel mesentery appear unchanged. There are small subcentimeter short axis mesenteric nodules in association, as before. Musculoskeletal: No worrisome lytic or sclerotic lesions.  IMPRESSION: 1. No evidence of recurrent lymphoma. 2. Aortic atherosclerosis (ICD10-170.0). Coronary artery calcification. 3. Enlarged pulmonic trunk, indicative of pulmonary arterial hypertension. Electronically Signed   By: Lorin Picket M.D.   On:  01/04/2019 14:51    All questions were answered. The patient knows to call the clinic with any problems, questions or concerns. No barriers to learning was detected.  I spent 15 minutes counseling the patient face to face. The total time spent in the appointment was 20 minutes and more than 50% was on counseling and review of test results  Heath Lark, MD 01/06/2019 10:20 AM

## 2019-04-02 ENCOUNTER — Other Ambulatory Visit (HOSPITAL_COMMUNITY): Payer: Self-pay | Admitting: Family Medicine

## 2019-04-02 DIAGNOSIS — Z1231 Encounter for screening mammogram for malignant neoplasm of breast: Secondary | ICD-10-CM

## 2019-04-08 ENCOUNTER — Ambulatory Visit (HOSPITAL_COMMUNITY): Payer: Medicare Other

## 2019-04-14 ENCOUNTER — Ambulatory Visit (HOSPITAL_COMMUNITY)
Admission: RE | Admit: 2019-04-14 | Discharge: 2019-04-14 | Disposition: A | Discharge status: Home / Self Care | Payer: Medicare Other | Source: Ambulatory Visit | Attending: Family Medicine | Admitting: Family Medicine

## 2019-04-14 ENCOUNTER — Ambulatory Visit (HOSPITAL_COMMUNITY): Payer: Medicare Other

## 2019-04-14 ENCOUNTER — Other Ambulatory Visit: Payer: Self-pay

## 2019-04-14 DIAGNOSIS — Z1231 Encounter for screening mammogram for malignant neoplasm of breast: Secondary | ICD-10-CM | POA: Insufficient documentation

## 2019-07-05 ENCOUNTER — Other Ambulatory Visit: Payer: Self-pay | Admitting: Hematology and Oncology

## 2019-07-05 DIAGNOSIS — C8330 Diffuse large B-cell lymphoma, unspecified site: Secondary | ICD-10-CM

## 2019-07-05 DIAGNOSIS — C641 Malignant neoplasm of right kidney, except renal pelvis: Secondary | ICD-10-CM

## 2019-07-06 ENCOUNTER — Encounter: Payer: Self-pay | Admitting: Hematology and Oncology

## 2019-07-06 ENCOUNTER — Inpatient Hospital Stay: Payer: Medicare Other | Attending: Hematology and Oncology | Admitting: Hematology and Oncology

## 2019-07-06 ENCOUNTER — Other Ambulatory Visit: Payer: Self-pay

## 2019-07-06 ENCOUNTER — Inpatient Hospital Stay: Payer: Medicare Other

## 2019-07-06 VITALS — BP 149/88 | HR 70 | Temp 98.5°F | Resp 18 | Ht 68.0 in | Wt 216.2 lb

## 2019-07-06 DIAGNOSIS — C641 Malignant neoplasm of right kidney, except renal pelvis: Secondary | ICD-10-CM

## 2019-07-06 DIAGNOSIS — Z8572 Personal history of non-Hodgkin lymphomas: Secondary | ICD-10-CM | POA: Diagnosis not present

## 2019-07-06 DIAGNOSIS — Z8551 Personal history of malignant neoplasm of bladder: Secondary | ICD-10-CM | POA: Insufficient documentation

## 2019-07-06 DIAGNOSIS — I10 Essential (primary) hypertension: Secondary | ICD-10-CM | POA: Diagnosis not present

## 2019-07-06 DIAGNOSIS — C8330 Diffuse large B-cell lymphoma, unspecified site: Secondary | ICD-10-CM | POA: Diagnosis not present

## 2019-07-06 LAB — CBC WITH DIFFERENTIAL/PLATELET
Abs Immature Granulocytes: 0.01 10*3/uL (ref 0.00–0.07)
Basophils Absolute: 0 10*3/uL (ref 0.0–0.1)
Basophils Relative: 1 %
Eosinophils Absolute: 0.1 10*3/uL (ref 0.0–0.5)
Eosinophils Relative: 1 %
HCT: 38.4 % (ref 36.0–46.0)
Hemoglobin: 12.3 g/dL (ref 12.0–15.0)
Immature Granulocytes: 0 %
Lymphocytes Relative: 23 %
Lymphs Abs: 1.7 10*3/uL (ref 0.7–4.0)
MCH: 27.9 pg (ref 26.0–34.0)
MCHC: 32 g/dL (ref 30.0–36.0)
MCV: 87.1 fL (ref 80.0–100.0)
Monocytes Absolute: 0.6 10*3/uL (ref 0.1–1.0)
Monocytes Relative: 8 %
Neutro Abs: 5 10*3/uL (ref 1.7–7.7)
Neutrophils Relative %: 67 %
Platelets: 256 10*3/uL (ref 150–400)
RBC: 4.41 MIL/uL (ref 3.87–5.11)
RDW: 14 % (ref 11.5–15.5)
WBC: 7.5 10*3/uL (ref 4.0–10.5)
nRBC: 0 % (ref 0.0–0.2)

## 2019-07-06 LAB — COMPREHENSIVE METABOLIC PANEL
ALT: 13 U/L (ref 0–44)
AST: 17 U/L (ref 15–41)
Albumin: 4.2 g/dL (ref 3.5–5.0)
Alkaline Phosphatase: 85 U/L (ref 38–126)
Anion gap: 10 (ref 5–15)
BUN: 29 mg/dL — ABNORMAL HIGH (ref 8–23)
CO2: 25 mmol/L (ref 22–32)
Calcium: 9.5 mg/dL (ref 8.9–10.3)
Chloride: 105 mmol/L (ref 98–111)
Creatinine, Ser: 1.73 mg/dL — ABNORMAL HIGH (ref 0.44–1.00)
GFR calc Af Amer: 35 mL/min — ABNORMAL LOW (ref >60)
GFR calc non Af Amer: 30 mL/min — ABNORMAL LOW (ref >60)
Glucose, Bld: 100 mg/dL — ABNORMAL HIGH (ref 70–99)
Potassium: 4.2 mmol/L (ref 3.5–5.1)
Sodium: 140 mmol/L (ref 135–145)
Total Bilirubin: 0.3 mg/dL (ref 0.3–1.2)
Total Protein: 7.7 g/dL (ref 6.5–8.1)

## 2019-07-06 LAB — LACTATE DEHYDROGENASE: LDH: 137 U/L (ref 98–192)

## 2019-07-06 NOTE — Progress Notes (Signed)
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 CT CHEST WO CONTRAST    Standing Status:   Future    Standing Expiration Date:   07/05/2020    Order Specific Question:   Preferred imaging location?    Answer:   [facility]    Order Specific Question:   Radiology Contrast Protocol - do NOT remove file path    Answer:   \\[email or web address]  . CT Abdomen Pelvis Wo Contrast    Standing Status:   Future    Standing Expiration Date:   07/05/2020    Order Specific Question:   Preferred imaging location?    Answer:   [facility]    Order Specific Question:   Is Oral Contrast requested for this exam?    Answer:   Yes, Per Radiology protocol    Order Specific Question:   Radiology Contrast Protocol - do NOT remove file path    Answer:   \\[email or web address]  . CBC with Differential/Platelet    Standing Status:   Future    Standing Expiration Date:   08/09/2020  . Comprehensive metabolic panel    Standing Status:   Future    Standing Expiration Date:   08/09/2020  . Lactate dehydrogenase    Standing Status:   Future    Standing Expiration Date:   07/05/2020    All  questions were answered. The patient knows to call the clinic with any problems, questions or concerns. The total time spent in the appointment was 20 minutes encounter with patients including review of chart and various tests results, discussions about plan of care and coordination of care plan   Heath Lark, MD 07/06/2019 4:08 PM  INTERVAL HISTORY: Please see below for problem oriented charting. She feels well No recent lymphadenopathy She has intentional weight loss from healthy eating No recent infection She denies recent hematuria  SUMMARY OF ONCOLOGIC HISTORY: Oncology History  Diffuse large B cell lymphoma ([place])  12/12/2016 Imaging   CT scan showed  1. Extensive mesenteric, retroperitoneal, left hilar, mediastinal, left supraclavicular and left axillary adenopathy. Differential considerations include metastatic adenopathy, lymphoma and leukemia. The size of the nodes in the mesentery and retroperitoneum are suggestive of non-Hodgkin's lymphoma. 2. 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 Differential considerations include metastatic disease and lymphoma. A left upper lobe primary lung carcinoma is a possibility. 3. 3.2 cm solid right renal mass. Differential considerations include renal cell carcinoma and oncocytoma. 4. Small amount of free peritoneal fluid, most likely due to lymphatic obstruction by the mesenteric adenopathy. 5. Left fourth rib fracture. 6. Colonic diverticulosis. 7. Calcific coronary artery and aortic atherosclerosis. Aortic Atherosclerosis (ICD10-I70.0).  12/12/2016 - 12/14/2016 Hospital Admission   He was briefly admitted and evaluated for weakness.  CT imaging showed diffuse disease suspicious for lymphoma and outpatient evaluation was arranged    12/18/2016 Imaging   MR thoracic spine Very limited examination demonstrating compression of the cord from approximately mid T6 to T7-8 by  epidural tumor eccentric to the right and surrounding the cord both anteriorly and posteriorly. Tumor fills the right neural foramina at T6-7 and T7-8 and completely replaces the T7 vertebral body.   12/18/2016 Surgery   She underwent procedure: Decompressive thoracic laminectomy from T5-T8 for resection of epidural tumor     12/18/2016 Pathology Results   1. Soft tissue mass, simple excision, thoracic dorsal epidural mass - DIFFUSE LARGE B-CELL LYMPHOMA. - SEE ONCOLOGY TABLE. 2. Soft tissue mass, simple excision, thoracic dorsal epidural mass - DIFFUSE LARGE B-CELL LYMPHOMA. Microscopic Comment 1. LYMPHOMA Histologic type: Non-Hodgkin B-cell lymphoma: Diffuse large B-cell lymphoma. Grade (if applicable): High grade. Flow cytometry: N/A. Immunohistochemical stains: CD20. CD3, CD5, CD10, CD23, CD30, CD15, PAX-5, Ki-67, bcl-2, bcl-6, CD34. Touch preps/imprints: N/A. Comments: There is a diffuse infiltrate of atypical lymphocytes. The lymphocytes are medium to large in size with irregular nuclear contours. There are background smaller lymphocytes. While there are admixed smaller cells, there are diffuse areas of large cells. The large lymphocytes are positive for CD20, CD10, bcl-6, and bcl-2. CD3 and CD5 highlight admixed T-cells. CD34, CD30, CD23, and CD15 are negative. PAX-5 is positive. Ki-67 is variable with areas up to 70%. 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    12/18/2016 - 01/17/2017 Hospital Admission   The patient was readmitted to the hospital due to complete weakness secondary to spinal cord compression.  She underwent surgery and pathology report confirmed diagnosis of diffuse large B-cell lymphoma. She received IT chemo on 01/16/17   01/16/2017 Pathology Results   CEREBROSPINAL FLUID (SPECIMEN 1 OF 1  COLLECTED 01/16/17): NO MALIGNANT CELLS IDENTIFIED.   02/03/2017 - 02/07/2017 Hospital Admission   She is admitted for cycle 2 of chemo   02/24/2017 - 02/28/2017 Hospital Admission   She is admitted for cycle 3 of chemotherapy   03/13/2017 PET scan   1. Low level FDG uptake associated with previous areas of soft tissue tumor within the chest and abdomen. Deauville criteria 2 and 3 compatible with treated tumor. No sites of residual metabolically active tumor (Deauville criteria 4 or 5) identified. 2. Mild diffuse uptake throughout the bone marrow is noted which is nonspecific in may reflect treatment related changes. 3. Stable size of previously noted complex lesion arising from the posterior cortex of the right kidney which now measures fluid attenuation. This is incompletely characterized on today's given lack of IV contrast material. 4. Aortic Atherosclerosis (ICD10-I70.0). Multi vessel coronary artery calcifications noted.   03/13/2017 Imaging   Study Conclusions  - Procedure narrative: Transthoracic echocardiography. Image quality was suboptimal. The study was technically difficult, as a result of poor acoustic windows, poor sound wave transmission, chest wall deformity, and body habitus. - Left ventricle: GLLS is normal at -19% The cavity size was normal. There was mild focal basal hypertrophy of the septum. Systolic function was vigorous. The estimated ejection fraction was in the range of 65% to 70%. Wall motion was normal; there were no regional wall motion abnormalities. There was an increased relative contribution of atrial contraction  to ventricular filling. Doppler parameters are consistent with abnormal left ventricular relaxation (grade 1 diastolic dysfunction). - Pulmonary arteries: Systolic pressure could not be accurately estimated.   03/24/2017 - 03/28/2017 Hospital Admission   The patient was admitted to the hospital for cycle 4 of chemotherapy   04/21/2017 - 04/25/2017 Hospital  Admission   She is admitted for cycle 5 of R-EPOCH   05/19/2017 - 05/23/2017 Hospital Admission   She is admitted for cycle 6 of R-EPOCH   06/20/2017 Imaging   1. No findings for residual or recurrent lymphoma involving the chest. The left fifth rib is healed and the surrounding extensive soft tissue density has completely resolved. 2. Interval regression of the vague mesenteric and retroperitoneal soft tissue densities and hazy interstitial soft tissue thickening around the mesenteric vessels. No new or enlarging lymph nodes to  suggest recurrent lymphoma. 3. 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 4. Decompressive laminectomies in the midthoracic spine. No findings for recurrent spinal tumor.   07/04/2017 Procedure   Removal of implanted Port-A-Cath utilizing sharp and blunt dissection. The procedure was uncomplicated.   12/25/2017 Imaging   IMPRESSION: Chest Impression:  1. No evidence of lymphoma recurrence in thorax.  Abdomen / Pelvis Impression:  1. No evidence of lymphoma recurrence in the abdomen pelvis. 2. Stable hazy peritoneal fat surrounding the mesenteric vessels. 3. Stable ill-defined nodular density in the LEFT retroperitoneum adjacent to the aorta. 4. Interval increase in size of RIGHT renal neoplasm   07/03/2018 Imaging   No findings suspicious for active lymphoma in the chest, abdomen, or pelvis. Spleen is normal in size.   Stable vague hazy soft tissue in the retroperitoneum/mesentery, possibly reflecting treated lymphoma.  Status post right nephrectomy. No evidence of recurrent or metastatic disease.  Mild ground-glass opacity in the medial right lower lobe, nonspecific, possibly reflecting mild infection.    01/04/2019 Imaging   1. No evidence of recurrent lymphoma. 2. Aortic atherosclerosis (ICD10-170.0). Coronary artery calcification. 3. Enlarged pulmonic trunk, indicative of pulmonary arterial  hypertension   Cancer of right kidney ([place])  06/11/2018 Surgery   Procedure: 1.  right robot assisted laparoscopic radical nephrectomy  Attending: Nicolette Bang  Specimens: right radical nephrectomy  Findings: 1 renal artery and 1 renal vein. 2cm liver laceration requiring surgicel and surgiflo. The assistant was utilized for retraction, suction, passing instruments, and deploying the stapler cross the renal vein.    06/11/2018 Surgery   Kidney, radical nephrectomy for tumor, right RENAL CELL CARCINOMA, CONVENTIONAL CLEAR CELL TYPE, NUCLEAR GRADE 2 (SIZE 3.6 CM) TUMOR IS LIMITED TO THE KIDNEY (PT1A) LYMPHOVASCULAR INVASION IS IDENTIFIED URETERAL, VASCULAR AND ALL RESECTION MARGINS ARE NEGATIVE FOR TUMOR Microscopic Comment KIDNEY: Procedure: Radical Nephrectomy Specimen Laterality: Right Tumor Size: 3.6 cm Tumor Focality: Focal Histologic Type: Clear cell RCC Sarcomatoid Features: Negative Rhabdoid Features: Negative Histologic Grade: 2 Tumor Necrosis: Negative Tumor Extension: Negative Margins: Negative Regional Lymph Nodes: X No lymph nodes submitted or found Number of Lymph Nodes Involved: NA Number of Lymph Nodes Examined: NA Pathologic Stage Classification (pTNM, AJCC 8th Edition): pT 1a, pNx Pathologic Findings in Nonneoplastic Kidney: Chronic interstitial inflammation   06/26/2018 Cancer Staging   Staging form: Kidney, AJCC 8th Edition - Pathologic: Stage I (pT1a, pN0, cM0) - Signed by Heath Lark, MD on 06/26/2018     REVIEW OF SYSTEMS:   Constitutional: Denies fevers, chills or abnormal weight loss Eyes: Denies blurriness of vision Ears, nose, mouth, throat, and face: Denies mucositis  or sore throat Respiratory: Denies cough, dyspnea or wheezes Cardiovascular: Denies palpitation, chest discomfort or lower extremity swelling Gastrointestinal:  Denies nausea, heartburn or change in bowel habits Skin: Denies abnormal skin rashes Lymphatics: Denies new  lymphadenopathy or easy bruising Neurological:Denies numbness, tingling or new weaknesses Behavioral/Psych: Mood is stable, no new changes  All other systems were reviewed with the patient and are negative.  I have reviewed the past medical history, past surgical history, social history and family history with the patient and they are unchanged from previous note.  ALLERGIES:  is allergic to contrast media [iodinated diagnostic agents].  MEDICATIONS:  No current outpatient medications on file.   No current facility-administered medications for this visit.    PHYSICAL EXAMINATION: ECOG PERFORMANCE STATUS: 0 - Asymptomatic  Vitals:   07/06/19 1211  BP: (!) 149/88  Pulse: 70  Resp: 18  Temp: 98.5 F (36.9 C)  SpO2: 99%   Filed Weights   07/06/19 1211  Weight: 216 lb 3.2 oz (98.1 kg)    GENERAL:alert, no distress and comfortable SKIN: skin color, texture, turgor are normal, no rashes or significant lesions EYES: normal, Conjunctiva are pink and non-injected, sclera clear OROPHARYNX:no exudate, no erythema and lips, buccal mucosa, and tongue normal  NECK: supple, thyroid normal size, non-tender, without nodularity LYMPH:  no palpable lymphadenopathy in the cervical, axillary or inguinal LUNGS: clear to auscultation and percussion with normal breathing effort HEART: regular rate & rhythm and no murmurs and no lower extremity edema ABDOMEN:abdomen soft, non-tender and normal bowel sounds Musculoskeletal:no cyanosis of digits and no clubbing  NEURO: alert & oriented x 3 with fluent speech, no focal motor/sensory deficits  LABORATORY DATA:  I have reviewed the data as listed    Component Value Date/Time   NA 140 07/06/2019 1052   NA 134 (L) 02/19/2017 0925   K 4.2 07/06/2019 1052   K 3.0 (LL) 02/19/2017 0925   CL 105 07/06/2019 1052   CO2 25 07/06/2019 1052   CO2 29 02/19/2017 0925   GLUCOSE 100 (H) 07/06/2019 1052   GLUCOSE 135 02/19/2017 0925   BUN 29 (H) 07/06/2019  1052   BUN 7.4 02/19/2017 0925   CREATININE 1.73 (H) 07/06/2019 1052   CREATININE 0.9 02/19/2017 0925   CALCIUM 9.5 07/06/2019 1052   CALCIUM 8.9 02/19/2017 0925   PROT 7.7 07/06/2019 1052   PROT 6.4 02/19/2017 0925   ALBUMIN 4.2 07/06/2019 1052   ALBUMIN 3.4 (L) 02/19/2017 0925   AST 17 07/06/2019 1052   AST 16 02/19/2017 0925   ALT 13 07/06/2019 1052   ALT 13 02/19/2017 0925   ALKPHOS 85 07/06/2019 1052   ALKPHOS 86 02/19/2017 0925   BILITOT 0.3 07/06/2019 1052   BILITOT 0.65 02/19/2017 0925   GFRNONAA 30 (L) 07/06/2019 1052   GFRAA 35 (L) 07/06/2019 1052    No results found for: SPEP, UPEP  Lab Results  Component Value Date   WBC 7.5 07/06/2019   NEUTROABS 5.0 07/06/2019   HGB 12.3 07/06/2019   HCT 38.4 07/06/2019   MCV 87.1 07/06/2019   PLT 256 07/06/2019      Chemistry      Component Value Date/Time   NA 140 07/06/2019 1052   NA 134 (L) 02/19/2017 0925   K 4.2 07/06/2019 1052   K 3.0 (LL) 02/19/2017 0925   CL 105 07/06/2019 1052   CO2 25 07/06/2019 1052   CO2 29 02/19/2017 0925   BUN 29 (H) 07/06/2019 1052   BUN 7.4 02/19/2017  2671   CREATININE 1.73 (H) 07/06/2019 1052   CREATININE 0.9 02/19/2017 0925      Component Value Date/Time   CALCIUM 9.5 07/06/2019 1052   CALCIUM 8.9 02/19/2017 0925   ALKPHOS 85 07/06/2019 1052   ALKPHOS 86 02/19/2017 0925   AST 17 07/06/2019 1052   AST 16 02/19/2017 0925   ALT 13 07/06/2019 1052   ALT 13 02/19/2017 0925   BILITOT 0.3 07/06/2019 1052   BILITOT 0.65 02/19/2017 0925

## 2019-07-06 NOTE — Assessment & Plan Note (Signed)
She has no signs of cancer recurrence Per guidelines, I will see her back in 6 months with history, physical examination

## 2019-07-06 NOTE — Assessment & Plan Note (Signed)
Her blood pressure is poorly controlled Despite recent weight loss, her BP is high I recommend resumption of BP medications She has appointment to see her PCP soon to discuss further

## 2019-07-06 NOTE — Assessment & Plan Note (Signed)
She have no signs of cancer recurrence I will order non contrast CT for surveillance in 6 months

## 2019-07-07 ENCOUNTER — Telehealth: Payer: Self-pay | Admitting: Hematology and Oncology

## 2019-07-07 ENCOUNTER — Encounter: Payer: Self-pay | Admitting: Urology

## 2019-07-07 ENCOUNTER — Ambulatory Visit: Payer: Medicare Other | Admitting: Urology

## 2019-07-07 VITALS — BP 138/88 | HR 70 | Temp 98.2°F | Ht 68.0 in | Wt 216.0 lb

## 2019-07-07 DIAGNOSIS — C641 Malignant neoplasm of right kidney, except renal pelvis: Secondary | ICD-10-CM | POA: Diagnosis not present

## 2019-07-07 MED ORDER — CYCLOBENZAPRINE HCL 5 MG PO TABS: 5.0000 mg | ORAL_TABLET | Freq: Three times a day (TID) | ORAL | 0 refills | Status: DC | PRN

## 2019-07-07 NOTE — Progress Notes (Signed)
07/07/2019 10:08 AM   Katrina [name] August 23, 1950 [id number]  Referring provider: Lemmie Evens, MD [place],  [place] [address]  Right RCC  HPI: Katrina [name] is a 69yo here for followup for Right RCC. She has intermittent right flank pain that is worse with standing from a sitting position Creatinine 1.73. GFR 30. CMP normal except for creatinine. CBC normal from 07/06/2019. No other health concerns.   Here records from [place] are as follows: 04/08/2018: She was diagnosed with a renal tumor in 02/2017 which has subsequently grown 64mm in 7 months. It is a right mid posterior 3.8cm renal mass. She has a hx of B cell lymphoma and finished chemotherapy in 05/2017   04/29/2018: Pathology report from right renal mass biopsy revealed RCC grade 2   09/02/2018: s/p right radical nephrectomy. mild pain at incision site. pathology p1a   01/06/2019: CT chest/abd/pelvis shows no evidence of metastatic disease     PMH: Past Medical History:  Diagnosis Date  . Anemia   . Arthritis   . 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 Hypertension   . Lymphadenopathy 11/2016   "extensive"  . Non Hodgkin's lymphoma ([place]) 2019  . Paralysis ([place])    12-13-2016  . Paresthesia of lower extremity 11/2016   bilateral  . Pulmonary nodules 11/2016  . Renal cancer, right ([place]) dx'd 2019  . Right renal mass 11/2016    Surgical History: Past Surgical History:  Procedure Laterality Date  . ABDOMINAL HYSTERECTOMY    . BREAST LUMPECTOMY     left  . COLONOSCOPY N/A 11/02/2013   Procedure: COLONOSCOPY;  Surgeon: Danie Binder, MD;  [facility];  Service: Endoscopy;  Laterality: N/A;  1:15 - moved to [place] notified pt  . COLONOSCOPY N/A 04/17/2018   Procedure: COLONOSCOPY;  Surgeon: Danie Binder, MD;  [facility];  Service: Endoscopy;  Laterality: N/A;  12:00  . IR FLUORO GUIDE PORT INSERTION RIGHT  12/30/2016  . IR REMOVAL TUN ACCESS W/ PORT W/O FL MOD SED  07/04/2017  . IR US  GUIDE VASC ACCESS RIGHT  12/30/2016  . LAMINECTOMY N/A 12/18/2016   Procedure: DECOMPRESSION T5-8/THORACIC;  Surgeon: Kary Kos, MD;  Location: [facility];  Service: Neurosurgery;  Laterality: N/A;  Decompressive Thoracic Laminectomy, Thoracic five - six, Thoraic six - seven, Thoracic seven - eight  . POLYPECTOMY  04/17/2018   Procedure: POLYPECTOMY;  Surgeon: Danie Binder, MD;  [facility];  Service: Endoscopy;;  splenic flexure  . ROBOT ASSISTED LAPAROSCOPIC NEPHRECTOMY Right 06/11/2018   Procedure: XI ROBOTIC ASSISTED LAPAROSCOPIC NEPHRECTOMY;  Surgeon: Cleon Gustin, MD;  Location: [facility];  Service: Urology;  Laterality: Right;  2.5 HRS  . SHOULDER SURGERY     right    Home Medications:  Allergies as of 07/07/2019      Reactions   Contrast Media [iodinated Diagnostic Agents] Hives, Itching      Medication List    as of Jul 07, 2019 10:08 AM   You have not been prescribed any medications.     Allergies:  Allergies  Allergen Reactions  . Contrast Media [Iodinated Diagnostic Agents] Hives and Itching    Family History: Family History  Problem Relation Age of Onset  . Colon cancer Sister        at age 70  . Colon cancer Brother        at age 39  . COPD Mother   . Diabetes  Mellitus II Mother   . CAD Mother        Angina  . Colon cancer Father   . Thyroid disease Sister   . Diabetes Mellitus II Sister     Social History:  reports that she quit smoking about 23 years ago. Her smoking use included cigarettes. She has a 15.00 pack-year smoking history. She has never used smokeless tobacco. She reports that she does not drink alcohol or use drugs.  ROS: All other review of systems were reviewed and are negative except what is noted above in HPI  Physical Exam: BP 138/88   Pulse 70   Temp 98.2 F (36.8 C)   Ht 5\' 8"  (1.727 m)   Wt 216 lb (98 kg)   BMI 32.84 kg/m   Constitutional:  Alert and oriented, No acute distress. HEENT: [place] AT, moist mucus  membranes.  Trachea midline, no masses. Cardiovascular: No clubbing, cyanosis, or edema. Respiratory: Normal respiratory effort, no increased work of breathing. GI: Abdomen is soft, nontender, nondistended, no abdominal masses GU: No CVA tenderness.  Lymph: No cervical or inguinal lymphadenopathy. Skin: No rashes, bruises or suspicious lesions. Neurologic: Grossly intact, no focal deficits, moving all 4 extremities. Psychiatric: Normal mood and affect.  Laboratory Data: Lab Results  Component Value Date   WBC 7.5 07/06/2019   HGB 12.3 07/06/2019   HCT 38.4 07/06/2019   MCV 87.1 07/06/2019   PLT 256 07/06/2019    Lab Results  Component Value Date   CREATININE 1.73 (H) 07/06/2019    No results found for: PSA  No results found for: TESTOSTERONE  No results found for: HGBA1C  Urinalysis    Component Value Date/Time   COLORURINE YELLOW 12/19/2016 0916   APPEARANCEUR CLOUDY (A) 12/19/2016 0916   LABSPEC 1.021 12/19/2016 0916   PHURINE 5.0 12/19/2016 0916   GLUCOSEU NEGATIVE 12/19/2016 0916   HGBUR NEGATIVE 12/19/2016 0916   BILIRUBINUR NEGATIVE 12/19/2016 0916   KETONESUR NEGATIVE 12/19/2016 0916   PROTEINUR NEGATIVE 12/19/2016 0916   NITRITE NEGATIVE 12/19/2016 0916   LEUKOCYTESUR SMALL (A) 12/19/2016 0916    Lab Results  Component Value Date   BACTERIA RARE (A) 12/19/2016    Pertinent Imaging:  No results found for this or any previous visit. No results found for this or any previous visit. No results found for this or any previous visit. No results found for this or any previous visit. No results found for this or any previous visit. No results found for this or any previous visit. No results found for this or any previous visit. No results found for this or any previous visit.  Assessment & Plan:    1. Cancer of right kidney ([place]) -RTC 6 months with CMP and CXR   Return in about 6 months (around 01/07/2020) for CXR and CMP.  Nicolette Bang,  MD  [facility] [place]

## 2019-07-07 NOTE — Telephone Encounter (Signed)
Scheduled per 5/18 sch msg. Unable to reach pt- left voicemail. appts on 11/18 and 11/19.

## 2019-07-07 NOTE — Patient Instructions (Signed)
Kidney Cancer  Kidney cancer is an abnormal growth of cells in one or both kidneys. The kidneys filter waste from your blood and produce urine. Kidney cancer may spread to other parts of your body. This type of cancer may also be called renal cell carcinoma. What are the causes? The cause of this condition is not always known. In some cases, abnormal changes to genes (genetic mutations) can cause cells to form cancer. What increases the risk? You may be more likely to develop kidney cancer if you:  Are over age 60. The risk increases with age.  Have a family history of kidney cancer.  Are of African-American, Native American, or Native [place] descent.  Smoke.  Are female.  Are obese.  Have high blood pressure (hypertension).  Have advanced kidney disease, especially if you need long-term dialysis.  Have certain conditions that are passed from parent to child (inherited), such as von Hippel-Lindau disease, tuberous sclerosis, or hereditary papillary renal carcinoma.  Have been exposed to certain chemicals. What are the signs or symptoms? In the early stages, kidney cancer does not cause symptoms. As the cancer grows, symptoms may include:  Blood in the urine.  Pain in the upper back or abdomen, just below the rib cage. You may feel pain on one or both sides of the body.  Fatigue.  Unexplained weight loss.  Fever. How is this diagnosed? This condition may be diagnosed based on:  Your symptoms and medical history.  A physical exam.  Blood and urine tests.  X-rays.  Imaging tests, such as CT scans, MRIs, and PET scans.  Having dye injected into your blood through an IV, and then having X-rays taken of: ? Your kidneys and the rest of the organs involved in making and storing urine (intravenous pyelogram). ? Your blood vessels (angiogram).  Removal and testing of a kidney tissue sample (biopsy). Your cancer will be assessed (staged), based on how severe it is and how  much it has spread. How is this treated? Treatment depends on the type and stage of the cancer. Treatment may include one or more of the following:  Surgery. This may include surgery to remove: ? Just the tumor (nephron-sparing surgery). ? The entire kidney (nephrectomy). ? The kidney, some of the surrounding healthy tissue, nearby lymph nodes, and the adrenal gland in certain cases (radical nephrectomy).  Medicines that kill cancer cells (chemotherapy).  High-energy rays that kill cancer cells (radiation therapy).  Targeted therapy. This targets specific parts of cancer cells and the area around them to block the growth and the spread of the cancer. Targeted therapy can help to limit the damage to healthy cells.  Medicines that help your body's disease-fighting system (immune system) fight cancer cells (immunotherapy).  Freezing cancer cells using gas or liquid that is delivered through a needle (cryoablation).  Destroying cancer cells using high-energy radio waves that are delivered through a needle-like probe (radiofrequency ablation).  A procedure to block the artery that supplies blood to the tumor, which kills the cancer cells (embolization). Follow these instructions at home: Eating and drinking  Some of your treatments might affect your appetite and your ability to chew and swallow. If you are having problems eating, or if you do not have an appetite, meet with a diet and nutrition specialist (dietitian).  If you have side effects that affect eating, it may help to: ? Eat smaller meals and snacks often. ? Drink high-nutrition and high-calorie shakes or supplements. ? Eat bland and soft   foods that are easy to eat. ? Not eat foods that are hot, spicy, or hard to swallow. Lifestyle  Do not drink alcohol.  Do not use any products that contain nicotine or tobacco, such as cigarettes and e-cigarettes. If you need help quitting, ask your health care provider. General  instructions   Take over-the-counter and prescription medicines only as told by your health care provider. This includes vitamins, supplements, and herbal products.  Consider joining a support group to help you cope with the stress of having kidney cancer.  Work with your health care provider to manage any side effects of treatment.  Keep all follow-up visits as told by your health care provider. This is important. Where to find more information  American Cancer Society: https://www.cancer.org  National Cancer Institute (NCI): https://www.cancer.gov Contact a health care provider if you:  Notice that you bruise or bleed easily.  Are losing weight without trying.  Have new or increased fatigue or weakness. Get help right away if you have:  Blood in your urine.  A sudden increase in pain.  A fever.  Shortness of breath.  Chest pain.  Yellow skin or whites of your eyes (jaundice). Summary  Kidney cancer is an abnormal growth of cells (tumor) in one or both kidneys. Tumors may spread to other parts of your body.  In the early stages, kidney cancer does not cause symptoms. As the cancer grows, symptoms may include blood in the urine, pain in the upper back or abdomen, unexplained weight loss, fatigue, and fever.  Treatment depends on the type and stage of the cancer. It may include surgery to remove the tumor, procedures and medicines to kill the cancer cells, or medicines to help your body fight cancer cells. This information is not intended to replace advice given to you by your health care provider. Make sure you discuss any questions you have with your health care provider. Document Revised: 02/26/2017 Document Reviewed: 02/23/2017 Elsevier Patient Education  2020 [employer] Inc.  

## 2019-07-07 NOTE — Progress Notes (Signed)
Urological Symptom Review  Patient is experiencing the following symptoms: Get up at night to urinate   Review of Systems  Gastrointestinal (upper)  : Negative for upper GI symptoms  Gastrointestinal (lower) : Negative for lower GI symptoms  Constitutional : Negative for symptoms  Skin: Negative for skin symptoms  Eyes: Negative for eye symptoms  Ear/Nose/Throat : Negative for Ear/Nose/Throat symptoms  Hematologic/Lymphatic: Negative for Hematologic/Lymphatic symptoms  Cardiovascular : Negative for cardiovascular symptoms  Respiratory : Negative for respiratory symptoms  Endocrine: Negative for endocrine symptoms  Musculoskeletal: Back pain Joint pain  Neurological: Negative for neurological symptoms  Psychologic: Negative for psychiatric symptoms

## 2019-12-22 IMAGING — CT CT CHEST WITHOUT CONTRAST
2 of 4 series · 13 of 36 positions shown, 16 images · non-contrast
Comparison: 12/25/2017

CLINICAL DATA: B-cell lymphoma, status post chemotherapy. New right
renal cell cancer, status post right nephrectomy. Prior hysterectomy
and appendectomy. Right abdominal pain.

EXAM:
CT CHEST, ABDOMEN AND PELVIS WITHOUT CONTRAST
TECHNIQUE: Multidetector CT imaging of the chest, abdomen and pelvis was
performed following the standard protocol without IV contrast.

[Series 2: cap w/o · axial · non-contrast · 0.83mm/px · z∈[-782,-242]mm · 10 of 134 slices shown, 13 images]
[im 13/134  mediastinal]
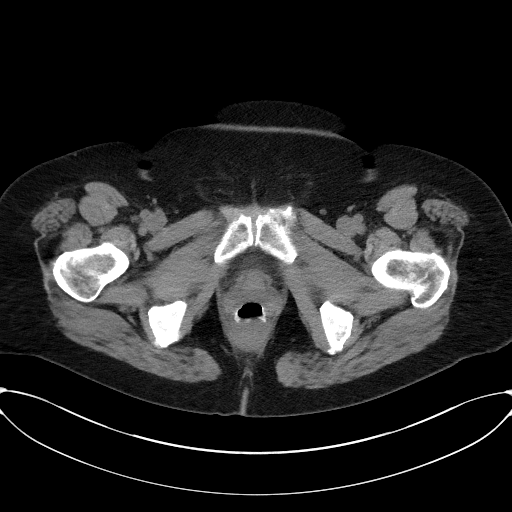
[im 13/134  lung]
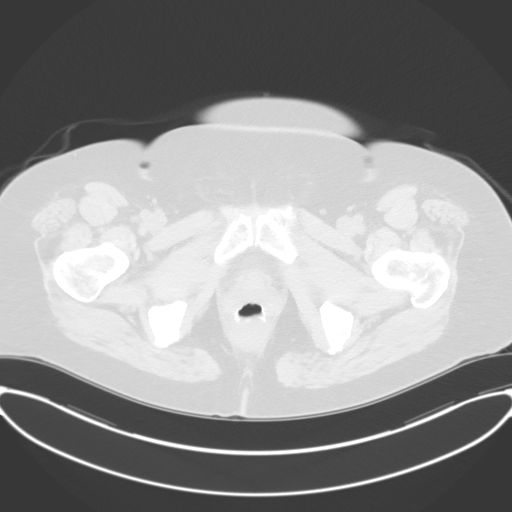
[im 25/134  lung]
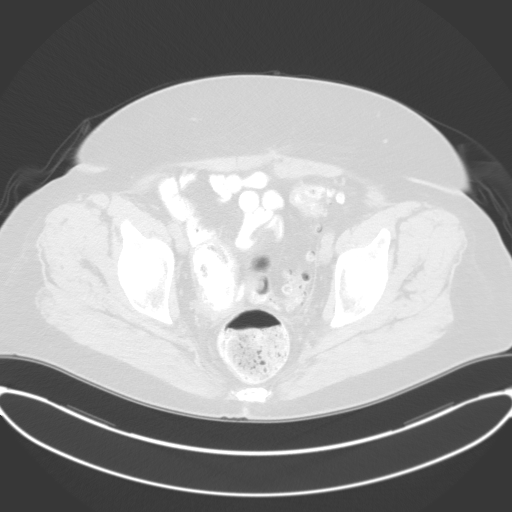
[im 37/134  lung]
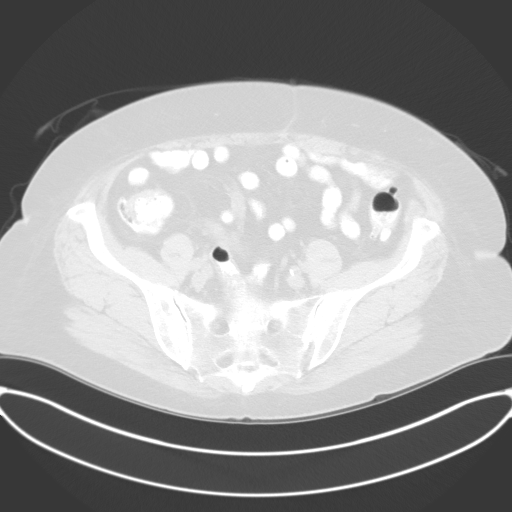
[im 49/134  lung]
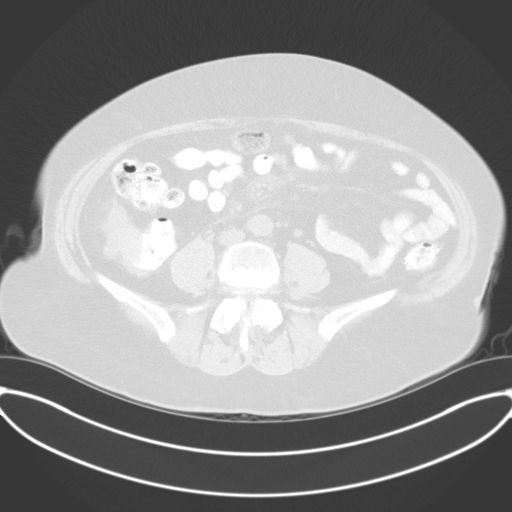
[im 61/134  mediastinal]
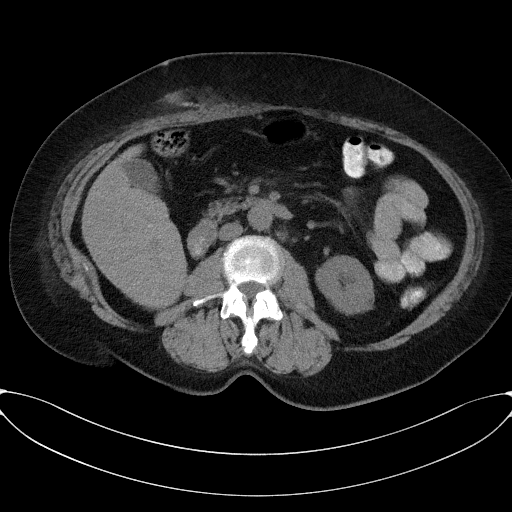
[im 61/134  lung]
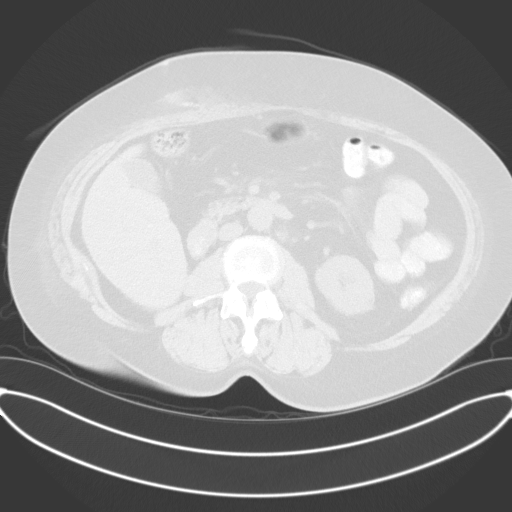
[im 73/134  lung]
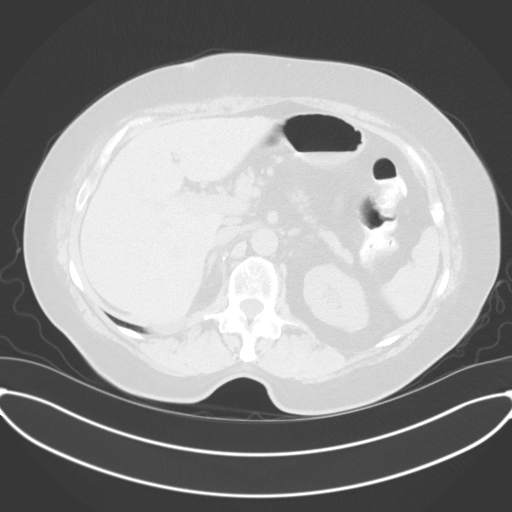
[im 85/134  lung]
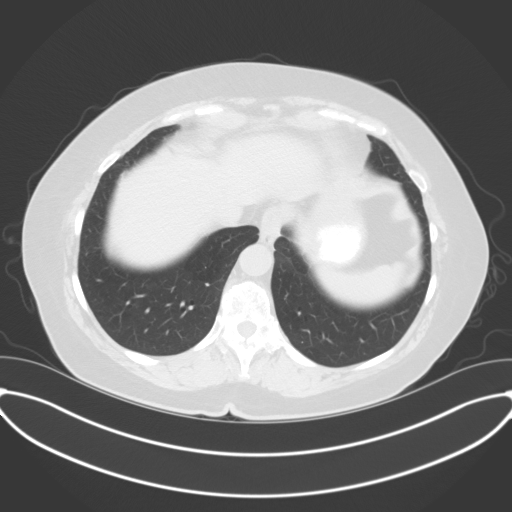
[im 97/134  lung]
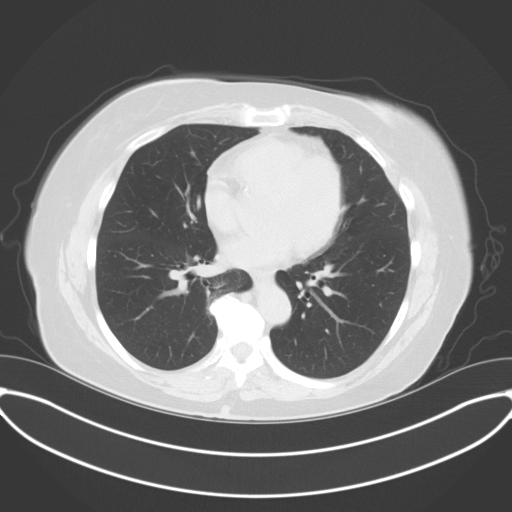
[im 109/134  mediastinal]
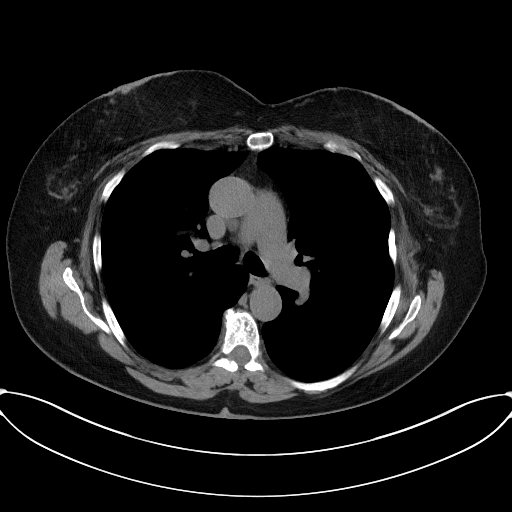
[im 109/134  lung]
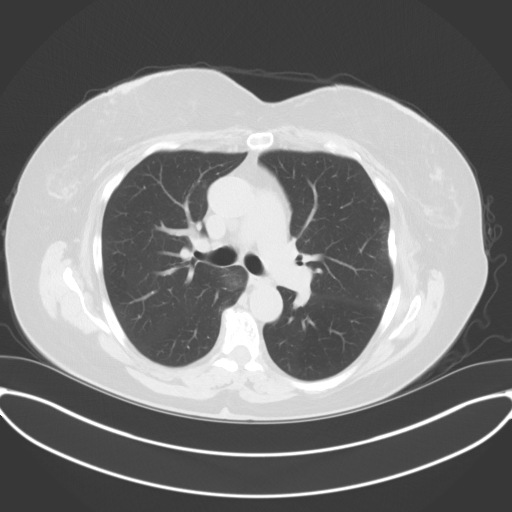
[im 121/134  lung]
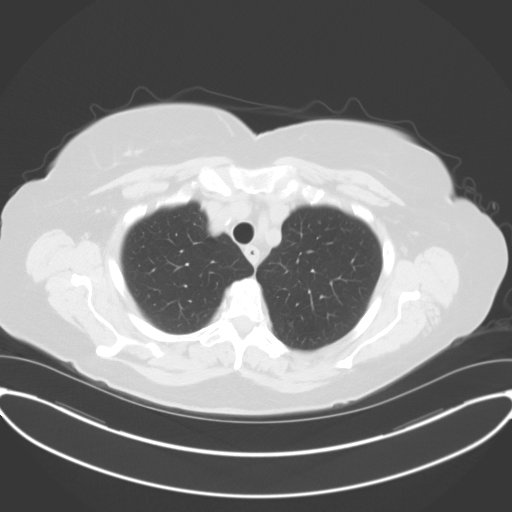

[Series 5: coronals · coronal · 0.82mm/px · 3 of 165 slices shown]
[im 33/165  lung]
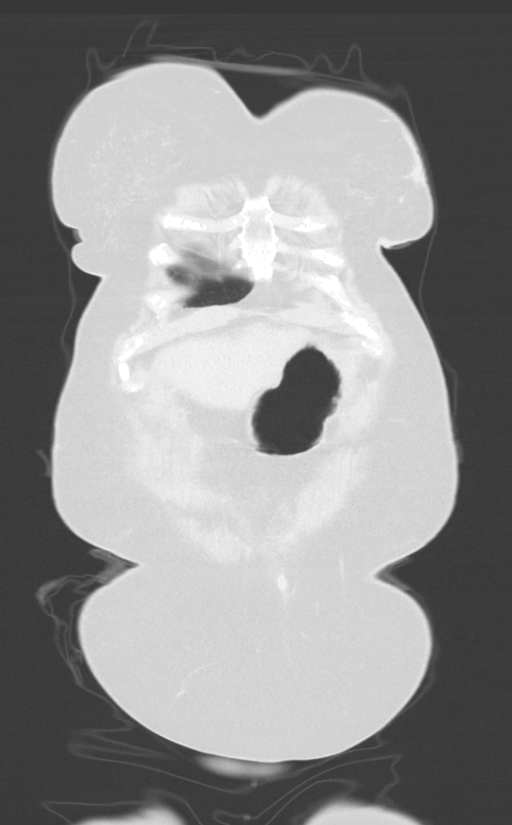
[im 66/165  lung]
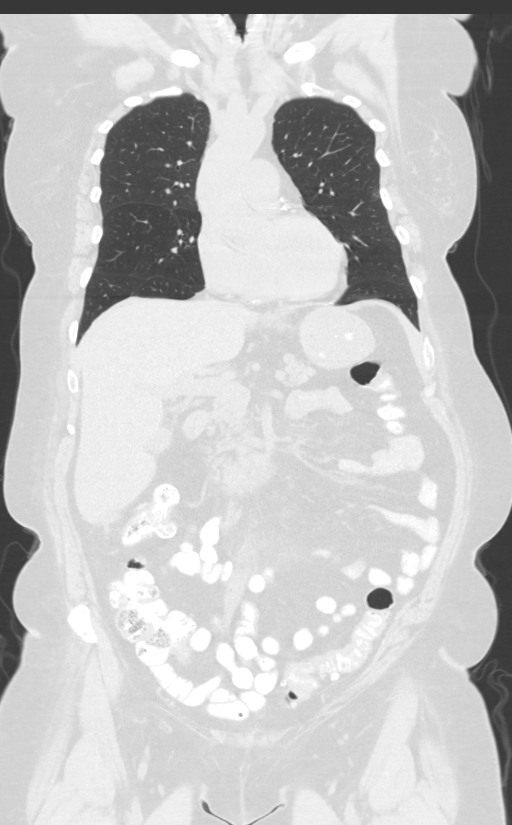
[im 99/165  lung]
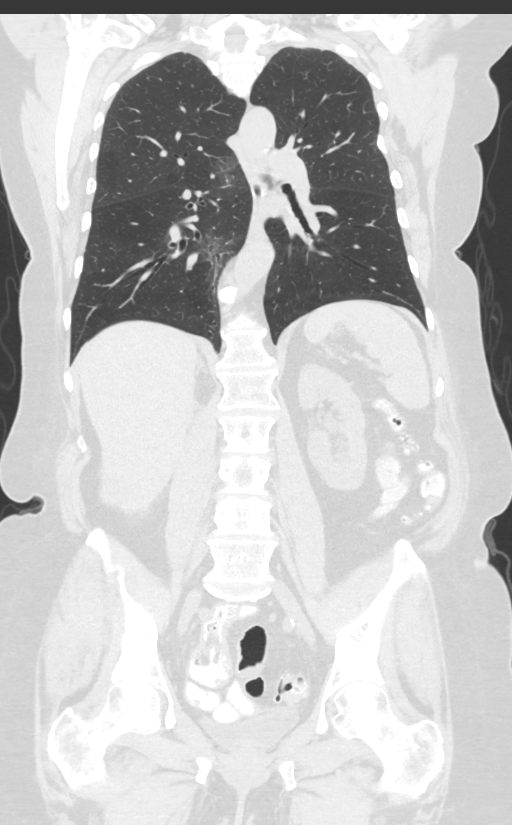

[13 of 36 positions shown; findings below may reference images not displayed]

FINDINGS: CT CHEST FINDINGS

Cardiovascular: Heart is normal in size.  No pericardial effusion.

No evidence of thoracic aortic aneurysm. Mild atherosclerotic
calcifications of the aortic arch.

Mild three-vessel coronary atherosclerosis.

Mediastinum/Nodes: No suspicious mediastinal or axillary
lymphadenopathy.

Visualized thyroid is unremarkable.

Lungs/Pleura: No suspicious pulmonary nodules.

Mild ground-glass opacity in the medial right lower lobe (series
4/image 69), nonspecific, possibly reflecting mild infection.
Additional compressive atelectasis in the medial right lower lobe
(series 4/image 100).

No focal consolidation.

No pleural effusion or pneumothorax.

Musculoskeletal: Degenerative changes of the thoracic spine.

CT ABDOMEN PELVIS FINDINGS

Hepatobiliary: Unenhanced liver is grossly unremarkable.

Gallbladder is unremarkable. No intrahepatic or extrahepatic ductal
dilatation.

Pancreas: Within normal limits.

Spleen: Spleen is normal in size.

Adrenals/Urinary Tract: Adrenal glands are within normal limits.

Status post right nephrectomy. No abnormal soft tissue in the
surgical bed.

Left kidney is within normal limits. No renal, ureteral, or bladder
calculi. No hydronephrosis.

Bladder is within normal limits.

Stomach/Bowel: Stomach is within normal limits.

No evidence of bowel obstruction.

Prior appendectomy.

Sigmoid diverticulosis, without evidence of diverticulitis.

Vascular/Lymphatic: No evidence of abdominal aortic aneurysm.

Atherosclerotic calcifications of the abdominal aorta and branch
vessels.

No suspicious abdominopelvic lymphadenopathy.

Stable vague hazy soft tissue in the left para-aortic region (series
2/image 32), in the left mid abdominal mesentery (series 2/image
83), and extending into the right lower quadrant/ileocecal mesentery
(series 2/image 95).

Reproductive: Status post hysterectomy.

No adnexal masses.

Other: No abdominopelvic ascites.

Musculoskeletal: Visualized osseous structures are within normal
limits.
IMPRESSION: No findings suspicious for active lymphoma in the chest, abdomen, or
pelvis. Spleen is normal in size.

Stable vague hazy soft tissue in the retroperitoneum/mesentery,
possibly reflecting treated lymphoma.

Status post right nephrectomy. No evidence of recurrent or
metastatic disease.

Mild ground-glass opacity in the medial right lower lobe,
nonspecific, possibly reflecting mild infection.

## 2020-01-05 ENCOUNTER — Ambulatory Visit (INDEPENDENT_AMBULATORY_CARE_PROVIDER_SITE_OTHER): Payer: Medicare Other | Admitting: Urology

## 2020-01-05 ENCOUNTER — Other Ambulatory Visit: Payer: Self-pay

## 2020-01-05 ENCOUNTER — Encounter: Payer: Self-pay | Admitting: Urology

## 2020-01-05 VITALS — BP 142/82 | HR 84 | Temp 98.6°F | Ht 68.0 in | Wt 221.0 lb

## 2020-01-05 DIAGNOSIS — C641 Malignant neoplasm of right kidney, except renal pelvis: Secondary | ICD-10-CM

## 2020-01-05 NOTE — Patient Instructions (Signed)
Kidney Cancer  Kidney cancer is an abnormal growth of cells in one or both kidneys. The kidneys filter waste from your blood and produce urine. Kidney cancer may spread to other parts of your body. This type of cancer may also be called renal cell carcinoma. What are the causes? The cause of this condition is not always known. In some cases, abnormal changes to genes (genetic mutations) can cause cells to form cancer. What increases the risk? You may be more likely to develop kidney cancer if you:  Are over age 60. The risk increases with age.  Have a family history of kidney cancer.  Are of African-American, Native American, or Native [place] descent.  Smoke.  Are female.  Are obese.  Have high blood pressure (hypertension).  Have advanced kidney disease, especially if you need long-term dialysis.  Have certain conditions that are passed from parent to child (inherited), such as von Hippel-Lindau disease, tuberous sclerosis, or hereditary papillary renal carcinoma.  Have been exposed to certain chemicals. What are the signs or symptoms? In the early stages, kidney cancer does not cause symptoms. As the cancer grows, symptoms may include:  Blood in the urine.  Pain in the upper back or abdomen, just below the rib cage. You may feel pain on one or both sides of the body.  Fatigue.  Unexplained weight loss.  Fever. How is this diagnosed? This condition may be diagnosed based on:  Your symptoms and medical history.  A physical exam.  Blood and urine tests.  X-rays.  Imaging tests, such as CT scans, MRIs, and PET scans.  Having dye injected into your blood through an IV, and then having X-rays taken of: ? Your kidneys and the rest of the organs involved in making and storing urine (intravenous pyelogram). ? Your blood vessels (angiogram).  Removal and testing of a kidney tissue sample (biopsy). Your cancer will be assessed (staged), based on how severe it is and how  much it has spread. How is this treated? Treatment depends on the type and stage of the cancer. Treatment may include one or more of the following:  Surgery. This may include surgery to remove: ? Just the tumor (nephron-sparing surgery). ? The entire kidney (nephrectomy). ? The kidney, some of the surrounding healthy tissue, nearby lymph nodes, and the adrenal gland in certain cases (radical nephrectomy).  Medicines that kill cancer cells (chemotherapy).  High-energy rays that kill cancer cells (radiation therapy).  Targeted therapy. This targets specific parts of cancer cells and the area around them to block the growth and the spread of the cancer. Targeted therapy can help to limit the damage to healthy cells.  Medicines that help your body's disease-fighting system (immune system) fight cancer cells (immunotherapy).  Freezing cancer cells using gas or liquid that is delivered through a needle (cryoablation).  Destroying cancer cells using high-energy radio waves that are delivered through a needle-like probe (radiofrequency ablation).  A procedure to block the artery that supplies blood to the tumor, which kills the cancer cells (embolization). Follow these instructions at home: Eating and drinking  Some of your treatments might affect your appetite and your ability to chew and swallow. If you are having problems eating, or if you do not have an appetite, meet with a diet and nutrition specialist (dietitian).  If you have side effects that affect eating, it may help to: ? Eat smaller meals and snacks often. ? Drink high-nutrition and high-calorie shakes or supplements. ? Eat bland and soft   foods that are easy to eat. ? Not eat foods that are hot, spicy, or hard to swallow. Lifestyle  Do not drink alcohol.  Do not use any products that contain nicotine or tobacco, such as cigarettes and e-cigarettes. If you need help quitting, ask your health care provider. General  instructions   Take over-the-counter and prescription medicines only as told by your health care provider. This includes vitamins, supplements, and herbal products.  Consider joining a support group to help you cope with the stress of having kidney cancer.  Work with your health care provider to manage any side effects of treatment.  Keep all follow-up visits as told by your health care provider. This is important. Where to find more information  American Cancer Society: https://www.cancer.org  National Cancer Institute (NCI): https://www.cancer.gov Contact a health care provider if you:  Notice that you bruise or bleed easily.  Are losing weight without trying.  Have new or increased fatigue or weakness. Get help right away if you have:  Blood in your urine.  A sudden increase in pain.  A fever.  Shortness of breath.  Chest pain.  Yellow skin or whites of your eyes (jaundice). Summary  Kidney cancer is an abnormal growth of cells (tumor) in one or both kidneys. Tumors may spread to other parts of your body.  In the early stages, kidney cancer does not cause symptoms. As the cancer grows, symptoms may include blood in the urine, pain in the upper back or abdomen, unexplained weight loss, fatigue, and fever.  Treatment depends on the type and stage of the cancer. It may include surgery to remove the tumor, procedures and medicines to kill the cancer cells, or medicines to help your body fight cancer cells. This information is not intended to replace advice given to you by your health care provider. Make sure you discuss any questions you have with your health care provider. Document Revised: 02/26/2017 Document Reviewed: 02/23/2017 Elsevier Patient Education  2020 [employer] Inc.  

## 2020-01-05 NOTE — Progress Notes (Signed)
Urological Symptom Review  Patient is experiencing the following symptoms: Get up at night to urinate   Review of Systems  Gastrointestinal (upper)  : Negative for upper GI symptoms  Gastrointestinal (lower) : Constipation  Constitutional : Negative for symptoms  Skin: Negative for skin symptoms  Eyes: Negative for eye symptoms  Ear/Nose/Throat : Negative for Ear/Nose/Throat symptoms  Hematologic/Lymphatic: Negative for Hematologic/Lymphatic symptoms  Cardiovascular : Negative for cardiovascular symptoms  Respiratory : Negative for respiratory symptoms  Endocrine: Negative for endocrine symptoms  Musculoskeletal: Joint pain  Neurological: Negative for neurological symptoms  Psychologic: Negative for psychiatric symptoms

## 2020-01-05 NOTE — Progress Notes (Signed)
01/05/2020 10:26 AM   Katrina [name] 1950-11-19 [id number]  Referring provider: Lemmie Evens, MD [place],  [place] [id number]  followup right RCC  HPI: Katrina [name] is a 69yo her efor followup for right RCC s/p Right radical nephrectomy 06/11/2018. She is doing well. She is scheduled for her scans and labs tomorrow   PMH: Past Medical History:  Diagnosis Date  . Anemia   . Arthritis   . 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 Hypertension   . Lymphadenopathy 11/2016   "extensive"  . Non Hodgkin's lymphoma ([place]) 2019  . Paralysis ([place])    12-13-2016  . Paresthesia of lower extremity 11/2016   bilateral  . Pulmonary nodules 11/2016  . Renal cancer, right ([place]) dx'd 2019  . Right renal mass 11/2016    Surgical History: Past Surgical History:  Procedure Laterality Date  . ABDOMINAL HYSTERECTOMY    . BREAST LUMPECTOMY     left  . COLONOSCOPY N/A 11/02/2013   Procedure: COLONOSCOPY;  Surgeon: Danie Binder, MD;  [facility];  Service: Endoscopy;  Laterality: N/A;  1:15 - moved to [place] notified pt  . COLONOSCOPY N/A 04/17/2018   Procedure: COLONOSCOPY;  Surgeon: Danie Binder, MD;  Location: [facility];  Service: Endoscopy;  Laterality: N/A;  12:00  . IR FLUORO GUIDE PORT INSERTION RIGHT  12/30/2016  . IR REMOVAL TUN ACCESS W/ PORT W/O FL MOD SED  07/04/2017  . IR US GUIDE VASC ACCESS RIGHT  12/30/2016  . LAMINECTOMY N/A 12/18/2016   Procedure: DECOMPRESSION T5-8/THORACIC;  Surgeon: Kary Kos, MD;  Location: [facility];  Service: Neurosurgery;  Laterality: N/A;  Decompressive Thoracic Laminectomy, Thoracic five - six, Thoraic six - seven, Thoracic seven - eight  . POLYPECTOMY  04/17/2018   Procedure: POLYPECTOMY;  Surgeon: Danie Binder, MD;  [facility];  Service: Endoscopy;;  splenic flexure  . ROBOT ASSISTED LAPAROSCOPIC NEPHRECTOMY Right 06/11/2018   Procedure: XI ROBOTIC ASSISTED LAPAROSCOPIC NEPHRECTOMY;  Surgeon: Cleon Gustin,  MD;  Location: [facility];  Service: Urology;  Laterality: Right;  2.5 HRS  . SHOULDER SURGERY     right    Home Medications:  Allergies as of 01/05/2020      Reactions   Contrast Media [iodinated Diagnostic Agents] Hives, Itching      Medication List       Accurate as of January 05, 2020 10:26 AM. If you have any questions, ask your nurse or doctor.        STOP taking these medications   cyclobenzaprine 5 MG tablet Commonly known as: FLEXERIL Stopped by: Nicolette Bang, MD     TAKE these medications   cholecalciferol 25 MCG (1000 UNIT) tablet Commonly known as: VITAMIN D3 Take 1,000 Units by mouth daily.   multivitamin tablet Take 1 tablet by mouth daily.   VITAMIN B 12 PO Take by mouth.       Allergies:  Allergies  Allergen Reactions  . Contrast Media [Iodinated Diagnostic Agents] Hives and Itching    Family History: Family History  Problem Relation Age of Onset  . Colon cancer Sister        at age 39  . Colon cancer Brother        at age 80  . COPD Mother   . Diabetes Mellitus II Mother   . CAD Mother        Angina  . Colon cancer Father   . Thyroid  disease Sister   . Diabetes Mellitus II Sister     Social History:  reports that she quit smoking about 23 years ago. Her smoking use included cigarettes. She has a 15.00 pack-year smoking history. She has never used smokeless tobacco. She reports that she does not drink alcohol and does not use drugs.  ROS: All other review of systems were reviewed and are negative except what is noted above in HPI  Physical Exam: BP (!) 142/82   Pulse 84   Temp 98.6 F (37 C)   Ht 5\' 8"  (1.727 m)   Wt 221 lb (100.2 kg)   BMI 33.60 kg/m   Constitutional:  Alert and oriented, No acute distress. HEENT:  AT, moist mucus membranes.  Trachea midline, no masses. Cardiovascular: No clubbing, cyanosis, or edema. Respiratory: Normal respiratory effort, no increased work of breathing. GI: Abdomen is soft, nontender,  nondistended, no abdominal masses GU: No CVA tenderness.  Lymph: No cervical or inguinal lymphadenopathy. Skin: No rashes, bruises or suspicious lesions. Neurologic: Grossly intact, no focal deficits, moving all 4 extremities. Psychiatric: Normal mood and affect.  Laboratory Data: Lab Results  Component Value Date   WBC 7.5 07/06/2019   HGB 12.3 07/06/2019   HCT 38.4 07/06/2019   MCV 87.1 07/06/2019   PLT 256 07/06/2019    Lab Results  Component Value Date   CREATININE 1.73 (H) 07/06/2019    No results found for: PSA  No results found for: TESTOSTERONE  No results found for: HGBA1C  Urinalysis    Component Value Date/Time   COLORURINE YELLOW 12/19/2016 0916   APPEARANCEUR CLOUDY (A) 12/19/2016 0916   LABSPEC 1.021 12/19/2016 0916   PHURINE 5.0 12/19/2016 0916   GLUCOSEU NEGATIVE 12/19/2016 0916   HGBUR NEGATIVE 12/19/2016 0916   BILIRUBINUR NEGATIVE 12/19/2016 0916   KETONESUR NEGATIVE 12/19/2016 0916   PROTEINUR NEGATIVE 12/19/2016 0916   NITRITE NEGATIVE 12/19/2016 0916   LEUKOCYTESUR SMALL (A) 12/19/2016 0916    Lab Results  Component Value Date   BACTERIA RARE (A) 12/19/2016    Pertinent Imaging:  No results found for this or any previous visit.  No results found for this or any previous visit.  No results found for this or any previous visit.  No results found for this or any previous visit.  No results found for this or any previous visit.  No results found for this or any previous visit.  No results found for this or any previous visit.  No results found for this or any previous visit.   Assessment & Plan:    1. Cancer of right kidney [facility]) -Will call with results of bloodwork and CT. RTC 6 months with CMP   No follow-ups on file.  Nicolette Bang, MD  [facility] [place]

## 2020-01-06 ENCOUNTER — Ambulatory Visit (HOSPITAL_COMMUNITY)
Admission: RE | Admit: 2020-01-06 | Discharge: 2020-01-06 | Disposition: A | Discharge status: Home / Self Care | Payer: Medicare Other | Source: Ambulatory Visit | Attending: Hematology and Oncology | Admitting: Hematology and Oncology

## 2020-01-06 ENCOUNTER — Inpatient Hospital Stay: Payer: Medicare Other | Attending: Hematology and Oncology

## 2020-01-06 ENCOUNTER — Other Ambulatory Visit: Payer: Self-pay

## 2020-01-06 DIAGNOSIS — Z8572 Personal history of non-Hodgkin lymphomas: Secondary | ICD-10-CM | POA: Diagnosis not present

## 2020-01-06 DIAGNOSIS — C8330 Diffuse large B-cell lymphoma, unspecified site: Secondary | ICD-10-CM | POA: Diagnosis not present

## 2020-01-06 DIAGNOSIS — N183 Chronic kidney disease, stage 3 unspecified: Secondary | ICD-10-CM | POA: Diagnosis not present

## 2020-01-06 DIAGNOSIS — Z905 Acquired absence of kidney: Secondary | ICD-10-CM | POA: Diagnosis not present

## 2020-01-06 DIAGNOSIS — Z85528 Personal history of other malignant neoplasm of kidney: Secondary | ICD-10-CM | POA: Diagnosis not present

## 2020-01-06 DIAGNOSIS — C641 Malignant neoplasm of right kidney, except renal pelvis: Secondary | ICD-10-CM | POA: Insufficient documentation

## 2020-01-06 LAB — CBC WITH DIFFERENTIAL/PLATELET
Abs Immature Granulocytes: 0.02 K/uL (ref 0.00–0.07)
Basophils Absolute: 0 K/uL (ref 0.0–0.1)
Basophils Relative: 0 %
Eosinophils Absolute: 0.1 K/uL (ref 0.0–0.5)
Eosinophils Relative: 1 %
HCT: 39.6 % (ref 36.0–46.0)
Hemoglobin: 13 g/dL (ref 12.0–15.0)
Immature Granulocytes: 0 %
Lymphocytes Relative: 22 %
Lymphs Abs: 1.8 K/uL (ref 0.7–4.0)
MCH: 28.2 pg (ref 26.0–34.0)
MCHC: 32.8 g/dL (ref 30.0–36.0)
MCV: 85.9 fL (ref 80.0–100.0)
Monocytes Absolute: 0.6 K/uL (ref 0.1–1.0)
Monocytes Relative: 8 %
Neutro Abs: 5.3 K/uL (ref 1.7–7.7)
Neutrophils Relative %: 69 %
Platelets: 291 K/uL (ref 150–400)
RBC: 4.61 MIL/uL (ref 3.87–5.11)
RDW: 13.7 % (ref 11.5–15.5)
WBC: 7.8 K/uL (ref 4.0–10.5)
nRBC: 0 % (ref 0.0–0.2)

## 2020-01-06 LAB — COMPREHENSIVE METABOLIC PANEL
ALT: 14 U/L (ref 0–44)
AST: 19 U/L (ref 15–41)
Albumin: 4.4 g/dL (ref 3.5–5.0)
Alkaline Phosphatase: 72 U/L (ref 38–126)
Anion gap: 10 (ref 5–15)
BUN: 30 mg/dL — ABNORMAL HIGH (ref 8–23)
CO2: 25 mmol/L (ref 22–32)
Calcium: 9.8 mg/dL (ref 8.9–10.3)
Chloride: 103 mmol/L (ref 98–111)
Creatinine, Ser: 1.71 mg/dL — ABNORMAL HIGH (ref 0.44–1.00)
GFR, Estimated: 32 mL/min — ABNORMAL LOW (ref >60)
Glucose, Bld: 104 mg/dL — ABNORMAL HIGH (ref 70–99)
Potassium: 4.3 mmol/L (ref 3.5–5.1)
Sodium: 138 mmol/L (ref 135–145)
Total Bilirubin: 0.4 mg/dL (ref 0.3–1.2)
Total Protein: 8.2 g/dL — ABNORMAL HIGH (ref 6.5–8.1)

## 2020-01-06 LAB — LACTATE DEHYDROGENASE: LDH: 170 U/L (ref 98–192)

## 2020-01-07 ENCOUNTER — Other Ambulatory Visit: Payer: Self-pay

## 2020-01-07 ENCOUNTER — Inpatient Hospital Stay: Payer: Medicare Other | Admitting: Hematology and Oncology

## 2020-01-07 ENCOUNTER — Telehealth: Payer: Self-pay | Admitting: Hematology and Oncology

## 2020-01-07 ENCOUNTER — Encounter: Payer: Self-pay | Admitting: Hematology and Oncology

## 2020-01-07 DIAGNOSIS — N183 Chronic kidney disease, stage 3 unspecified: Secondary | ICD-10-CM

## 2020-01-07 DIAGNOSIS — Z8572 Personal history of non-Hodgkin lymphomas: Secondary | ICD-10-CM | POA: Diagnosis not present

## 2020-01-07 DIAGNOSIS — C641 Malignant neoplasm of right kidney, except renal pelvis: Secondary | ICD-10-CM | POA: Diagnosis not present

## 2020-01-07 DIAGNOSIS — C8330 Diffuse large B-cell lymphoma, unspecified site: Secondary | ICD-10-CM

## 2020-01-07 NOTE — Progress Notes (Signed)
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[facility]) She has no signs or symptoms of cancer recurrence I will discontinue surveillance imaging I will see her back in 6 months for further follow-up  Cancer of right kidney [facility]) She has no signs of cancer recurrence she does not need surveillance imaging observe closely for now  CKD (chronic kidney disease), stage III ([place]) She has chronic kidney disease stage III due to nephrectomy I recommend aggressive blood pressure management I recommend follow-up with primary care doctor for follow-up   No orders of the defined types were placed in this encounter.   All questions were answered. The patient knows to call the clinic with any problems, questions or concerns. The total time spent in the appointment was 20 minutes encounter with patients including review of chart and various tests results, discussions about plan of care and coordination of care plan   Heath Lark, MD 01/07/2020 12:19 PM  INTERVAL HISTORY: Please see below for problem oriented charting. She returns for further follow-up she is doing well no recent lymphadenopathy no recent infection, fever or chills she is noted to have gained some weight she is attempting to go on healthy diet and lose weight  SUMMARY OF ONCOLOGIC HISTORY: Oncology History  Diffuse large B cell lymphoma ([place])  12/12/2016 Imaging   CT scan showed  1. Extensive mesenteric, retroperitoneal, left hilar, mediastinal, left supraclavicular and left axillary adenopathy. Differential considerations include metastatic adenopathy, lymphoma and leukemia. The size of the nodes in the mesentery and retroperitoneum are suggestive of non-Hodgkin's  lymphoma. 2. 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 Differential considerations include metastatic disease and lymphoma. A left upper lobe primary lung carcinoma is a possibility. 3. 3.2 cm solid right renal mass. Differential considerations include renal cell carcinoma and oncocytoma. 4. Small amount of free peritoneal fluid, most likely due to lymphatic obstruction by the mesenteric adenopathy. 5. Left fourth rib fracture. 6. Colonic diverticulosis. 7. Calcific coronary artery and aortic atherosclerosis. Aortic Atherosclerosis (ICD10-I70.0).    12/12/2016 - 12/14/2016 Hospital Admission   He was briefly admitted and evaluated for weakness.  CT imaging showed diffuse disease suspicious for lymphoma and outpatient evaluation was arranged    12/18/2016 Imaging   MR thoracic spine Very limited examination demonstrating compression of the cord from approximately mid T6 to T7-8 by epidural tumor eccentric to the right and surrounding the cord both anteriorly and posteriorly. Tumor fills the right neural foramina at T6-7 and T7-8 and completely replaces the T7 vertebral body.   12/18/2016 Surgery   She underwent procedure: Decompressive thoracic laminectomy from T5-T8 for resection of epidural tumor     12/18/2016 Pathology Results   1. Soft tissue mass, simple excision, thoracic dorsal epidural mass - DIFFUSE LARGE B-CELL LYMPHOMA. - SEE ONCOLOGY TABLE. 2. Soft tissue mass, simple excision, thoracic dorsal epidural mass - DIFFUSE LARGE B-CELL LYMPHOMA. Microscopic Comment 1. LYMPHOMA Histologic type: Non-Hodgkin B-cell lymphoma: Diffuse large B-cell lymphoma. Grade (if applicable): High grade. Flow cytometry: N/A. Immunohistochemical stains: CD20. CD3, CD5, CD10, CD23, CD30, CD15, PAX-5, Ki-67, bcl-2, bcl-6, CD34. Touch preps/imprints: N/A. Comments: There is a diffuse infiltrate of atypical lymphocytes. The  lymphocytes are medium to large in size with irregular  nuclear contours. There are background smaller lymphocytes. While there are admixed smaller cells, there are diffuse areas of large cells. The large lymphocytes are positive for CD20, CD10, bcl-6, and bcl-2. CD3 and CD5 highlight admixed T-cells. CD34, CD30, CD23, and CD15 are negative. PAX-5 is positive. Ki-67 is variable with areas up to 70%. 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    12/18/2016 - 01/17/2017 Hospital Admission   The patient was readmitted to the hospital due to complete weakness secondary to spinal cord compression.  She underwent surgery and pathology report confirmed diagnosis of diffuse large B-cell lymphoma. She received IT chemo on 01/16/17   01/16/2017 Pathology Results   CEREBROSPINAL FLUID (SPECIMEN 1 OF 1 COLLECTED 01/16/17): NO MALIGNANT CELLS IDENTIFIED.   02/03/2017 - 02/07/2017 Hospital Admission   She is admitted for cycle 2 of chemo   02/24/2017 - 02/28/2017 Hospital Admission   She is admitted for cycle 3 of chemotherapy   03/13/2017 PET scan   1. Low level FDG uptake associated with previous areas of soft tissue tumor within the chest and abdomen. Deauville criteria 2 and 3 compatible with treated tumor. No sites of residual metabolically active tumor (Deauville criteria 4 or 5) identified. 2. Mild diffuse uptake throughout the bone marrow is noted which is nonspecific in may reflect treatment related changes. 3. Stable size of previously noted complex lesion arising from the posterior cortex of the right kidney which now measures fluid attenuation. This is incompletely characterized on today's given lack of IV contrast material. 4. Aortic Atherosclerosis (ICD10-I70.0). Multi vessel coronary artery calcifications noted.   03/13/2017  Imaging   Study Conclusions  - Procedure narrative: Transthoracic echocardiography. Image quality was suboptimal. The study was technically difficult, as a result of poor acoustic windows, poor sound wave transmission, chest wall deformity, and body habitus. - Left ventricle: GLLS is normal at -19% The cavity size was normal. There was mild focal basal hypertrophy of the septum. Systolic function was vigorous. The estimated ejection fraction was in the range of 65% to 70%. Wall motion was normal; there were no regional wall motion abnormalities. There was an increased relative contribution of atrial contraction to ventricular filling. Doppler parameters are consistent with abnormal left ventricular relaxation (grade 1 diastolic dysfunction). - Pulmonary arteries: Systolic pressure could not be accurately estimated.   03/24/2017 - 03/28/2017 Hospital Admission   The patient was admitted to the hospital for cycle 4 of chemotherapy   04/21/2017 - 04/25/2017 Hospital Admission   She is admitted for cycle 5 of R-EPOCH   05/19/2017 - 05/23/2017 Hospital Admission   She is admitted for cycle 6 of R-EPOCH   06/20/2017 Imaging   1. No findings for residual or recurrent lymphoma involving the chest. The left fifth rib is healed and the surrounding extensive soft tissue density has completely resolved. 2. Interval regression of the vague mesenteric and retroperitoneal soft tissue densities and hazy interstitial soft tissue thickening around the mesenteric vessels. No new or enlarging lymph nodes to  suggest recurrent lymphoma. 3. 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 4. Decompressive laminectomies in the midthoracic spine. No findings for recurrent spinal tumor.   07/04/2017 Procedure   Removal of implanted Port-A-Cath utilizing sharp and blunt dissection. The procedure was uncomplicated.   12/25/2017 Imaging   IMPRESSION: Chest Impression:  1.  No evidence of  lymphoma recurrence in thorax.  Abdomen / Pelvis Impression:  1. No evidence of lymphoma recurrence in the abdomen pelvis. 2. Stable hazy peritoneal fat surrounding the mesenteric vessels. 3. Stable ill-defined nodular density in the LEFT retroperitoneum adjacent to the aorta. 4. Interval increase in size of RIGHT renal neoplasm   07/03/2018 Imaging   No findings suspicious for active lymphoma in the chest, abdomen, or pelvis. Spleen is normal in size.   Stable vague hazy soft tissue in the retroperitoneum/mesentery, possibly reflecting treated lymphoma.  Status post right nephrectomy. No evidence of recurrent or metastatic disease.  Mild ground-glass opacity in the medial right lower lobe, nonspecific, possibly reflecting mild infection.    01/04/2019 Imaging   1. No evidence of recurrent lymphoma. 2. Aortic atherosclerosis (ICD10-170.0). Coronary artery calcification. 3. Enlarged pulmonic trunk, indicative of pulmonary arterial hypertension   01/07/2020 Imaging   1. Stable CT of the chest, abdomen and pelvis. No specific findings identified to suggest residual or recurrent tumor or metastatic disease. 2. Coronary artery calcifications noted. 3. Status post right nephrectomy. 4. Similar appearance of hazy soft tissue stranding within the small bowel mesentery likely reflecting treated lymphoma. 5. Aortic atherosclerosis.   Cancer of right kidney [facility])  06/11/2018 Surgery   Procedure: 1.  right robot assisted laparoscopic radical nephrectomy  Attending: Nicolette Bang  Specimens: right radical nephrectomy  Findings: 1 renal artery and 1 renal vein. 2cm liver laceration requiring surgicel and surgiflo. The assistant was utilized for retraction, suction, passing instruments, and deploying the stapler cross the renal vein.    06/11/2018 Surgery   Kidney, radical nephrectomy for tumor, right RENAL CELL CARCINOMA, CONVENTIONAL CLEAR CELL TYPE, NUCLEAR GRADE 2 (SIZE 3.6  CM) TUMOR IS LIMITED TO THE KIDNEY (PT1A) LYMPHOVASCULAR INVASION IS IDENTIFIED URETERAL, VASCULAR AND ALL RESECTION MARGINS ARE NEGATIVE FOR TUMOR Microscopic Comment KIDNEY: Procedure: Radical Nephrectomy Specimen Laterality: Right Tumor Size: 3.6 cm Tumor Focality: Focal Histologic Type: Clear cell RCC Sarcomatoid Features: Negative Rhabdoid Features: Negative Histologic Grade: 2 Tumor Necrosis: Negative Tumor Extension: Negative Margins: Negative Regional Lymph Nodes: X No lymph nodes submitted or found Number of Lymph Nodes Involved: NA Number of Lymph Nodes Examined: NA Pathologic Stage Classification (pTNM, AJCC 8th Edition): pT 1a, pNx Pathologic Findings in Nonneoplastic Kidney: Chronic interstitial inflammation   06/26/2018 Cancer Staging   Staging form: Kidney, AJCC 8th Edition - Pathologic: Stage I (pT1a, pN0, cM0) - Signed by Heath Lark, MD on 06/26/2018     REVIEW OF SYSTEMS:   Constitutional: Denies fevers, chills or abnormal weight loss Eyes: Denies blurriness of vision Ears, nose, mouth, throat, and face: Denies mucositis or sore throat Respiratory: Denies cough, dyspnea or wheezes Cardiovascular: Denies palpitation, chest discomfort or lower extremity swelling Gastrointestinal:  Denies nausea, heartburn or change in bowel habits Skin: Denies abnormal skin rashes Lymphatics: Denies new lymphadenopathy or easy bruising Neurological:Denies numbness, tingling or new weaknesses Behavioral/Psych: Mood is stable, no new changes  All other systems were reviewed with the patient and are negative.  I have reviewed the past medical history, past surgical history, social history and family history with the patient and they are unchanged from previous note.  ALLERGIES:  is allergic to contrast media [iodinated diagnostic agents].  MEDICATIONS:  Current Outpatient Medications  Medication Sig Dispense Refill  . cholecalciferol (VITAMIN D3) 25 MCG (1000 UNIT) tablet  Take 1,000 Units by mouth daily.    . Cyanocobalamin (VITAMIN B 12 PO) Take by mouth.    . Multiple  Vitamin (MULTIVITAMIN) tablet Take 1 tablet by mouth daily.     No current facility-administered medications for this visit.    PHYSICAL EXAMINATION: ECOG PERFORMANCE STATUS: 0 - Asymptomatic  Vitals:   01/07/20 1134  BP: (!) 146/84  Pulse: 71  Resp: 18  Temp: 98.1 F (36.7 C)  SpO2: 98%   Filed Weights   01/07/20 1134  Weight: 220 lb (99.8 kg)    GENERAL:alert, no distress and comfortable  NEURO: alert & oriented x 3 with fluent speech, no focal motor/sensory deficits  LABORATORY DATA:  I have reviewed the data as listed    Component Value Date/Time   NA 138 01/06/2020 1130   NA 134 (L) 02/19/2017 0925   K 4.3 01/06/2020 1130   K 3.0 (LL) 02/19/2017 0925   CL 103 01/06/2020 1130   CO2 25 01/06/2020 1130   CO2 29 02/19/2017 0925   GLUCOSE 104 (H) 01/06/2020 1130   GLUCOSE 135 02/19/2017 0925   BUN 30 (H) 01/06/2020 1130   BUN 7.4 02/19/2017 0925   CREATININE 1.71 (H) 01/06/2020 1130   CREATININE 0.9 02/19/2017 0925   CALCIUM 9.8 01/06/2020 1130   CALCIUM 8.9 02/19/2017 0925   PROT 8.2 (H) 01/06/2020 1130   PROT 6.4 02/19/2017 0925   ALBUMIN 4.4 01/06/2020 1130   ALBUMIN 3.4 (L) 02/19/2017 0925   AST 19 01/06/2020 1130   AST 16 02/19/2017 0925   ALT 14 01/06/2020 1130   ALT 13 02/19/2017 0925   ALKPHOS 72 01/06/2020 1130   ALKPHOS 86 02/19/2017 0925   BILITOT 0.4 01/06/2020 1130   BILITOT 0.65 02/19/2017 0925   GFRNONAA 32 (L) 01/06/2020 1130   GFRAA 35 (L) 07/06/2019 1052    No results found for: SPEP, UPEP  Lab Results  Component Value Date   WBC 7.8 01/06/2020   NEUTROABS 5.3 01/06/2020   HGB 13.0 01/06/2020   HCT 39.6 01/06/2020   MCV 85.9 01/06/2020   PLT 291 01/06/2020      Chemistry      Component Value Date/Time   NA 138 01/06/2020 1130   NA 134 (L) 02/19/2017 0925   K 4.3 01/06/2020 1130   K 3.0 (LL) 02/19/2017 0925   CL 103  01/06/2020 1130   CO2 25 01/06/2020 1130   CO2 29 02/19/2017 0925   BUN 30 (H) 01/06/2020 1130   BUN 7.4 02/19/2017 0925   CREATININE 1.71 (H) 01/06/2020 1130   CREATININE 0.9 02/19/2017 0925      Component Value Date/Time   CALCIUM 9.8 01/06/2020 1130   CALCIUM 8.9 02/19/2017 0925   ALKPHOS 72 01/06/2020 1130   ALKPHOS 86 02/19/2017 0925   AST 19 01/06/2020 1130   AST 16 02/19/2017 0925   ALT 14 01/06/2020 1130   ALT 13 02/19/2017 0925   BILITOT 0.4 01/06/2020 1130   BILITOT 0.65 02/19/2017 0925       RADIOGRAPHIC STUDIES: I have personally reviewed the radiological images as listed and agreed with the findings in the report. CT Abdomen Pelvis Wo Contrast  Result Date: 01/07/2020 CLINICAL DATA:  Restaging right renal cell carcinoma and B-cell lymphoma. EXAM: CT CHEST, ABDOMEN AND PELVIS WITHOUT CONTRAST TECHNIQUE: Multidetector CT imaging of the chest, abdomen and pelvis was performed following the standard protocol without IV contrast. COMPARISON:  01/04/2019 FINDINGS: CT CHEST FINDINGS Cardiovascular: Normal heart size. No pericardial effusion identified. Aortic atherosclerosis. Coronary artery calcifications. Mediastinum/Nodes: No enlarged mediastinal, hilar, or axillary lymph nodes. Thyroid gland, trachea, and esophagus demonstrate no significant  findings. Lungs/Pleura: No pleural effusion, airspace consolidation, or atelectasis. No suspicious pulmonary nodule or mass identified. Musculoskeletal: No acute or suspicious bone lesions. CT ABDOMEN PELVIS FINDINGS Hepatobiliary: No focal liver abnormality is seen. No gallstones, gallbladder wall thickening, or biliary dilatation. Pancreas: Unremarkable. No pancreatic ductal dilatation or surrounding inflammatory changes. Spleen: Normal in size without focal abnormality. Adrenals/Urinary Tract: Normal appearance of the adrenal glands. Right nephrectomy. Left kidney unremarkable. No mass or hydronephrosis identified. Urinary bladder  unremarkable. Stomach/Bowel: Stomach is within normal limits. Appendix not confidently identified. No evidence of bowel wall thickening, distention, or inflammatory changes. Vascular/Lymphatic: No aneurysm. Hazy soft tissue stranding the within the small bowel mesentery is again noted and appears unchanged from previous exam likely reflecting treated lymphoma. No mesenteric or retroperitoneal adenopathy. No enlarged pelvic or inguinal lymph nodes. Reproductive: Status post hysterectomy. No adnexal masses. Other: No free fluid or fluid collections within the abdomen or pelvis. Musculoskeletal: Degenerative disc disease identified within the lumbar spine. Anterolisthesis of L4 on L5 measures 5 mm. No acute or suspicious osseous findings. IMPRESSION: 1. Stable CT of the chest, abdomen and pelvis. No specific findings identified to suggest residual or recurrent tumor or metastatic disease. 2. Coronary artery calcifications noted. 3. Status post right nephrectomy. 4. Similar appearance of hazy soft tissue stranding within the small bowel mesentery likely reflecting treated lymphoma. 5. Aortic atherosclerosis. Aortic Atherosclerosis (ICD10-I70.0). Electronically Signed   By: Kerby Moors M.D.   On: 01/07/2020 10:48   CT CHEST WO CONTRAST  Result Date: 01/07/2020 CLINICAL DATA:  Restaging right renal cell carcinoma and B-cell lymphoma. EXAM: CT CHEST, ABDOMEN AND PELVIS WITHOUT CONTRAST TECHNIQUE: Multidetector CT imaging of the chest, abdomen and pelvis was performed following the standard protocol without IV contrast. COMPARISON:  01/04/2019 FINDINGS: CT CHEST FINDINGS Cardiovascular: Normal heart size. No pericardial effusion identified. Aortic atherosclerosis. Coronary artery calcifications. Mediastinum/Nodes: No enlarged mediastinal, hilar, or axillary lymph nodes. Thyroid gland, trachea, and esophagus demonstrate no significant findings. Lungs/Pleura: No pleural effusion, airspace consolidation, or  atelectasis. No suspicious pulmonary nodule or mass identified. Musculoskeletal: No acute or suspicious bone lesions. CT ABDOMEN PELVIS FINDINGS Hepatobiliary: No focal liver abnormality is seen. No gallstones, gallbladder wall thickening, or biliary dilatation. Pancreas: Unremarkable. No pancreatic ductal dilatation or surrounding inflammatory changes. Spleen: Normal in size without focal abnormality. Adrenals/Urinary Tract: Normal appearance of the adrenal glands. Right nephrectomy. Left kidney unremarkable. No mass or hydronephrosis identified. Urinary bladder unremarkable. Stomach/Bowel: Stomach is within normal limits. Appendix not confidently identified. No evidence of bowel wall thickening, distention, or inflammatory changes. Vascular/Lymphatic: No aneurysm. Hazy soft tissue stranding the within the small bowel mesentery is again noted and appears unchanged from previous exam likely reflecting treated lymphoma. No mesenteric or retroperitoneal adenopathy. No enlarged pelvic or inguinal lymph nodes. Reproductive: Status post hysterectomy. No adnexal masses. Other: No free fluid or fluid collections within the abdomen or pelvis. Musculoskeletal: Degenerative disc disease identified within the lumbar spine. Anterolisthesis of L4 on L5 measures 5 mm. No acute or suspicious osseous findings. IMPRESSION: 1. Stable CT of the chest, abdomen and pelvis. No specific findings identified to suggest residual or recurrent tumor or metastatic disease. 2. Coronary artery calcifications noted. 3. Status post right nephrectomy. 4. Similar appearance of hazy soft tissue stranding within the small bowel mesentery likely reflecting treated lymphoma. 5. Aortic atherosclerosis. Aortic Atherosclerosis (ICD10-I70.0). Electronically Signed   By: Kerby Moors M.D.   On: 01/07/2020 10:48

## 2020-01-07 NOTE — Telephone Encounter (Signed)
Scheduled appts per 11/19 sch msg. Pt stated she would refer to mychart for AVS and appts.

## 2020-01-07 NOTE — Assessment & Plan Note (Signed)
She has chronic kidney disease stage III due to nephrectomy I recommend aggressive blood pressure management I recommend follow-up with primary care doctor for follow-up

## 2020-01-07 NOTE — Assessment & Plan Note (Signed)
She has no signs or symptoms of cancer recurrence I will discontinue surveillance imaging I will see her back in 6 months for further follow-up

## 2020-01-07 NOTE — Assessment & Plan Note (Signed)
She has no signs of cancer recurrence she does not need surveillance imaging observe closely for now

## 2020-05-15 ENCOUNTER — Other Ambulatory Visit (HOSPITAL_COMMUNITY): Payer: Self-pay | Admitting: Family Medicine

## 2020-05-15 DIAGNOSIS — Z1231 Encounter for screening mammogram for malignant neoplasm of breast: Secondary | ICD-10-CM

## 2020-06-01 ENCOUNTER — Ambulatory Visit (HOSPITAL_COMMUNITY): Payer: Medicare Other

## 2020-06-07 ENCOUNTER — Other Ambulatory Visit: Payer: Self-pay

## 2020-06-07 ENCOUNTER — Ambulatory Visit (HOSPITAL_COMMUNITY)
Admission: RE | Admit: 2020-06-07 | Discharge: 2020-06-07 | Disposition: A | Discharge status: Home / Self Care | Payer: Medicare Other | Source: Ambulatory Visit | Attending: Family Medicine | Admitting: Family Medicine

## 2020-06-07 DIAGNOSIS — Z1231 Encounter for screening mammogram for malignant neoplasm of breast: Secondary | ICD-10-CM | POA: Insufficient documentation

## 2020-06-12 ENCOUNTER — Telehealth: Payer: Self-pay

## 2020-06-12 NOTE — Telephone Encounter (Signed)
Wanting to know if she can have her appt made later due to having 2 sets of labs done for another doctor.  Please advise.  Call back:  [phone]   Thanks, Helene Kelp

## 2020-06-13 NOTE — Telephone Encounter (Signed)
Returned patients call and appointment changed.

## 2020-06-23 ENCOUNTER — Other Ambulatory Visit: Payer: Medicare Other

## 2020-06-30 ENCOUNTER — Ambulatory Visit: Payer: Medicare Other | Admitting: Urology

## 2020-07-05 ENCOUNTER — Telehealth: Payer: Self-pay

## 2020-07-05 NOTE — Telephone Encounter (Signed)
Pt call to confirm appt for 07/07/20. Date and time confirmed w/pt. Verbalized thanks and understanding.

## 2020-07-06 ENCOUNTER — Other Ambulatory Visit: Payer: Self-pay | Admitting: Hematology and Oncology

## 2020-07-06 ENCOUNTER — Ambulatory Visit: Payer: Medicare Other | Admitting: Urology

## 2020-07-06 DIAGNOSIS — C8330 Diffuse large B-cell lymphoma, unspecified site: Secondary | ICD-10-CM

## 2020-07-07 ENCOUNTER — Other Ambulatory Visit: Payer: Self-pay

## 2020-07-07 ENCOUNTER — Encounter: Payer: Self-pay | Admitting: Hematology and Oncology

## 2020-07-07 ENCOUNTER — Inpatient Hospital Stay: Payer: Medicare Other | Attending: Hematology and Oncology | Admitting: Hematology and Oncology

## 2020-07-07 ENCOUNTER — Inpatient Hospital Stay: Payer: Medicare Other

## 2020-07-07 DIAGNOSIS — Z8572 Personal history of non-Hodgkin lymphomas: Secondary | ICD-10-CM | POA: Diagnosis present

## 2020-07-07 DIAGNOSIS — C8330 Diffuse large B-cell lymphoma, unspecified site: Secondary | ICD-10-CM

## 2020-07-07 DIAGNOSIS — I129 Hypertensive chronic kidney disease with stage 1 through stage 4 chronic kidney disease, or unspecified chronic kidney disease: Secondary | ICD-10-CM | POA: Diagnosis not present

## 2020-07-07 DIAGNOSIS — N183 Chronic kidney disease, stage 3 unspecified: Secondary | ICD-10-CM | POA: Diagnosis not present

## 2020-07-07 DIAGNOSIS — C641 Malignant neoplasm of right kidney, except renal pelvis: Secondary | ICD-10-CM

## 2020-07-07 DIAGNOSIS — Z85528 Personal history of other malignant neoplasm of kidney: Secondary | ICD-10-CM | POA: Insufficient documentation

## 2020-07-07 DIAGNOSIS — I10 Essential (primary) hypertension: Secondary | ICD-10-CM

## 2020-07-07 LAB — CBC WITH DIFFERENTIAL/PLATELET
Abs Immature Granulocytes: 0.01 10*3/uL (ref 0.00–0.07)
Basophils Absolute: 0 10*3/uL (ref 0.0–0.1)
Basophils Relative: 0 %
Eosinophils Absolute: 0.1 10*3/uL (ref 0.0–0.5)
Eosinophils Relative: 1 %
HCT: 39.4 % (ref 36.0–46.0)
Hemoglobin: 12.8 g/dL (ref 12.0–15.0)
Immature Granulocytes: 0 %
Lymphocytes Relative: 25 %
Lymphs Abs: 1.7 10*3/uL (ref 0.7–4.0)
MCH: 27.8 pg (ref 26.0–34.0)
MCHC: 32.5 g/dL (ref 30.0–36.0)
MCV: 85.5 fL (ref 80.0–100.0)
Monocytes Absolute: 0.5 10*3/uL (ref 0.1–1.0)
Monocytes Relative: 8 %
Neutro Abs: 4.5 10*3/uL (ref 1.7–7.7)
Neutrophils Relative %: 66 %
Platelets: 263 10*3/uL (ref 150–400)
RBC: 4.61 MIL/uL (ref 3.87–5.11)
RDW: 13.6 % (ref 11.5–15.5)
WBC: 6.9 10*3/uL (ref 4.0–10.5)
nRBC: 0 % (ref 0.0–0.2)

## 2020-07-07 LAB — COMPREHENSIVE METABOLIC PANEL
ALT: 9 U/L (ref 0–44)
AST: 17 U/L (ref 15–41)
Albumin: 4.3 g/dL (ref 3.5–5.0)
Alkaline Phosphatase: 60 U/L (ref 38–126)
Anion gap: 11 (ref 5–15)
BUN: 32 mg/dL — ABNORMAL HIGH (ref 8–23)
CO2: 24 mmol/L (ref 22–32)
Calcium: 9.6 mg/dL (ref 8.9–10.3)
Chloride: 105 mmol/L (ref 98–111)
Creatinine, Ser: 1.59 mg/dL — ABNORMAL HIGH (ref 0.44–1.00)
GFR, Estimated: 35 mL/min — ABNORMAL LOW (ref >60)
Glucose, Bld: 104 mg/dL — ABNORMAL HIGH (ref 70–99)
Potassium: 4.4 mmol/L (ref 3.5–5.1)
Sodium: 140 mmol/L (ref 135–145)
Total Bilirubin: 0.4 mg/dL (ref 0.3–1.2)
Total Protein: 7.7 g/dL (ref 6.5–8.1)

## 2020-07-07 NOTE — Assessment & Plan Note (Signed)
She has no signs or symptoms of cancer recurrence I will discontinue surveillance imaging I plan to see her once a year for further follow-up

## 2020-07-07 NOTE — Assessment & Plan Note (Signed)
Blood pressure is mildly elevated but at home were within normal limits I would defer to her primary care doctor for management

## 2020-07-07 NOTE — Assessment & Plan Note (Signed)
She has follow-up with urologist I do not plan to order surveillance imaging from my perspective and would defer to her nephrologist for surveillance studies

## 2020-07-07 NOTE — Progress Notes (Signed)
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[facility]) She has no signs or symptoms of cancer recurrence I will discontinue surveillance imaging I plan to see her once a year for further follow-up  Cancer of right kidney [facility]) She has follow-up with urologist I do not plan to order surveillance imaging from my perspective and would defer to her nephrologist for surveillance studies  CKD (chronic kidney disease), stage III ([place]) She has chronic kidney disease stage III due to nephrectomy I recommend aggressive blood pressure management I recommend follow-up with primary care doctor for follow-up  Benign essential HTN Blood pressure is mildly elevated but at home were within normal limits I would defer to her primary care doctor for management   No orders of the defined types were placed in this encounter.   All questions were answered. The patient knows to call the clinic with any problems, questions or concerns. The total time spent in the appointment was 20 minutes encounter with patients including review of chart and various tests results, discussions about plan of care and coordination of care plan   Heath Lark, MD 07/07/2020 1:51 PM  INTERVAL HISTORY: Please see below for problem oriented charting. She returns for follow-up for lymphoma and kidney cancer She has lost some weight since last time I saw her through dietary modification No new lymphadenopathy Denies recent infection, fever or chills No hematuria  SUMMARY OF ONCOLOGIC HISTORY: Oncology History  Diffuse large B cell lymphoma ([place])  12/12/2016 Imaging   CT scan showed  1. Extensive mesenteric, retroperitoneal, left hilar, mediastinal, left  supraclavicular and left axillary adenopathy. Differential considerations include metastatic adenopathy, lymphoma and leukemia. The size of the nodes in the mesentery and retroperitoneum are suggestive of non-Hodgkin's lymphoma. 2. 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 Differential considerations include metastatic disease and lymphoma. A left upper lobe primary lung carcinoma is a possibility. 3. 3.2 cm solid right renal mass. Differential considerations include renal cell carcinoma and oncocytoma. 4. Small amount of free peritoneal fluid, most likely due to lymphatic obstruction by the mesenteric adenopathy. 5. Left fourth rib fracture. 6. Colonic diverticulosis. 7. Calcific coronary artery and aortic atherosclerosis. Aortic Atherosclerosis (ICD10-I70.0).    12/12/2016 - 12/14/2016 Hospital Admission   He was briefly admitted and evaluated for weakness.  CT imaging showed diffuse disease suspicious for lymphoma and outpatient evaluation was arranged    12/18/2016 Imaging   MR thoracic spine Very limited examination demonstrating compression of the cord from approximately mid T6 to T7-8 by epidural tumor eccentric to the right and surrounding the cord both anteriorly and posteriorly. Tumor fills the right neural foramina at T6-7 and T7-8 and completely replaces the T7 vertebral body.   12/18/2016 Surgery   She underwent procedure: Decompressive thoracic laminectomy from T5-T8 for resection of epidural tumor     12/18/2016 Pathology Results   1. Soft tissue mass, simple excision, thoracic dorsal epidural mass - DIFFUSE LARGE B-CELL LYMPHOMA. - SEE ONCOLOGY TABLE. 2. Soft tissue mass, simple excision, thoracic dorsal epidural mass - DIFFUSE LARGE B-CELL LYMPHOMA. Microscopic Comment 1. LYMPHOMA Histologic type: Non-Hodgkin B-cell lymphoma: Diffuse large B-cell lymphoma. Grade (if applicable): High grade. Flow  cytometry: N/A. Immunohistochemical stains: CD20. CD3, CD5,  CD10, CD23, CD30, CD15, PAX-5, Ki-67, bcl-2, bcl-6, CD34. Touch preps/imprints: N/A. Comments: There is a diffuse infiltrate of atypical lymphocytes. The lymphocytes are medium to large in size with irregular nuclear contours. There are background smaller lymphocytes. While there are admixed smaller cells, there are diffuse areas of large cells. The large lymphocytes are positive for CD20, CD10, bcl-6, and bcl-2. CD3 and CD5 highlight admixed T-cells. CD34, CD30, CD23, and CD15 are negative. PAX-5 is positive. Ki-67 is variable with areas up to 70%. 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    12/18/2016 - 01/17/2017 Hospital Admission   The patient was readmitted to the hospital due to complete weakness secondary to spinal cord compression.  She underwent surgery and pathology report confirmed diagnosis of diffuse large B-cell lymphoma. She received IT chemo on 01/16/17   01/16/2017 Pathology Results   CEREBROSPINAL FLUID (SPECIMEN 1 OF 1 COLLECTED 01/16/17): NO MALIGNANT CELLS IDENTIFIED.   02/03/2017 - 02/07/2017 Hospital Admission   She is admitted for cycle 2 of chemo   02/24/2017 - 02/28/2017 Hospital Admission   She is admitted for cycle 3 of chemotherapy   03/13/2017 PET scan   1. Low level FDG uptake associated with previous areas of soft tissue tumor within the chest and abdomen. Deauville criteria 2 and 3 compatible with treated tumor. No sites of residual metabolically active tumor (Deauville criteria 4 or 5) identified. 2. Mild diffuse uptake throughout the bone marrow is noted which is nonspecific in may reflect treatment related changes. 3. Stable size of previously noted complex lesion arising from the posterior cortex of the right kidney which now  measures fluid attenuation. This is incompletely characterized on today's given lack of IV contrast material. 4. Aortic Atherosclerosis (ICD10-I70.0). Multi vessel coronary artery calcifications noted.   03/13/2017 Imaging   Study Conclusions  - Procedure narrative: Transthoracic echocardiography. Image quality was suboptimal. The study was technically difficult, as a result of poor acoustic windows, poor sound wave transmission, chest wall deformity, and body habitus. - Left ventricle: GLLS is normal at -19% The cavity size was normal. There was mild focal basal hypertrophy of the septum. Systolic function was vigorous. The estimated ejection fraction was in the range of 65% to 70%. Wall motion was normal; there were no regional wall motion abnormalities. There was an increased relative contribution of atrial contraction to ventricular filling. Doppler parameters are consistent with abnormal left ventricular relaxation (grade 1 diastolic dysfunction). - Pulmonary arteries: Systolic pressure could not be accurately estimated.   03/24/2017 - 03/28/2017 Hospital Admission   The patient was admitted to the hospital for cycle 4 of chemotherapy   04/21/2017 - 04/25/2017 Hospital Admission   She is admitted for cycle 5 of R-EPOCH   05/19/2017 - 05/23/2017 Hospital Admission   She is admitted for cycle 6 of R-EPOCH   06/20/2017 Imaging   1. No findings for residual or recurrent lymphoma involving the chest. The left fifth rib is healed and the surrounding extensive soft tissue density has completely resolved. 2. Interval regression of the vague mesenteric and retroperitoneal soft tissue densities and hazy interstitial soft tissue thickening around the mesenteric vessels. No new or enlarging lymph nodes to  suggest recurrent lymphoma. 3. 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 4. Decompressive laminectomies in the midthoracic spine. No findings for recurrent spinal  tumor.   07/04/2017 Procedure   Removal of implanted Port-A-Cath utilizing sharp and blunt dissection. The procedure was uncomplicated.   12/25/2017 Imaging   IMPRESSION: Chest Impression:  1. No evidence of lymphoma recurrence in thorax.  Abdomen / Pelvis Impression:  1. No evidence of lymphoma recurrence in the abdomen pelvis. 2. Stable hazy peritoneal fat surrounding the mesenteric vessels. 3. Stable ill-defined nodular density in the LEFT retroperitoneum adjacent to the aorta. 4. Interval increase in size of RIGHT renal neoplasm   07/03/2018 Imaging   No findings suspicious for active lymphoma in the chest, abdomen, or pelvis. Spleen is normal in size.   Stable vague hazy soft tissue in the retroperitoneum/mesentery, possibly reflecting treated lymphoma.  Status post right nephrectomy. No evidence of recurrent or metastatic disease.  Mild ground-glass opacity in the medial right lower lobe, nonspecific, possibly reflecting mild infection.    01/04/2019 Imaging   1. No evidence of recurrent lymphoma. 2. Aortic atherosclerosis (ICD10-170.0). Coronary artery calcification. 3. Enlarged pulmonic trunk, indicative of pulmonary arterial hypertension   01/07/2020 Imaging   1. Stable CT of the chest, abdomen and pelvis. No specific findings identified to suggest residual or recurrent tumor or metastatic disease. 2. Coronary artery calcifications noted. 3. Status post right nephrectomy. 4. Similar appearance of hazy soft tissue stranding within the small bowel mesentery likely reflecting treated lymphoma. 5. Aortic atherosclerosis.   Cancer of right kidney [facility])  06/11/2018 Surgery   Procedure: 1.  right robot assisted laparoscopic radical nephrectomy  Attending: Nicolette Bang  Specimens: right radical nephrectomy  Findings: 1 renal artery and 1 renal vein. 2cm liver laceration requiring surgicel and surgiflo. The assistant was utilized for retraction, suction,  passing instruments, and deploying the stapler cross the renal vein.    06/11/2018 Surgery   Kidney, radical nephrectomy for tumor, right RENAL CELL CARCINOMA, CONVENTIONAL CLEAR CELL TYPE, NUCLEAR GRADE 2 (SIZE 3.6 CM) TUMOR IS LIMITED TO THE KIDNEY (PT1A) LYMPHOVASCULAR INVASION IS IDENTIFIED URETERAL, VASCULAR AND ALL RESECTION MARGINS ARE NEGATIVE FOR TUMOR Microscopic Comment KIDNEY: Procedure: Radical Nephrectomy Specimen Laterality: Right Tumor Size: 3.6 cm Tumor Focality: Focal Histologic Type: Clear cell RCC Sarcomatoid Features: Negative Rhabdoid Features: Negative Histologic Grade: 2 Tumor Necrosis: Negative Tumor Extension: Negative Margins: Negative Regional Lymph Nodes: X No lymph nodes submitted or found Number of Lymph Nodes Involved: NA Number of Lymph Nodes Examined: NA Pathologic Stage Classification (pTNM, AJCC 8th Edition): pT 1a, pNx Pathologic Findings in Nonneoplastic Kidney: Chronic interstitial inflammation   06/26/2018 Cancer Staging   Staging form: Kidney, AJCC 8th Edition - Pathologic: Stage I (pT1a, pN0, cM0) - Signed by Heath Lark, MD on 06/26/2018     REVIEW OF SYSTEMS:   Constitutional: Denies fevers, chills or abnormal weight loss Eyes: Denies blurriness of vision Ears, nose, mouth, throat, and face: Denies mucositis or sore throat Respiratory: Denies cough, dyspnea or wheezes Cardiovascular: Denies palpitation, chest discomfort or lower extremity swelling Gastrointestinal:  Denies nausea, heartburn or change in bowel habits Skin: Denies abnormal skin rashes Lymphatics: Denies new lymphadenopathy or easy bruising Neurological:Denies numbness, tingling or new weaknesses Behavioral/Psych: Mood is stable, no new changes  All other systems were reviewed with the patient and are negative.  I have reviewed the past medical history, past surgical history, social history and family history with the patient and they are unchanged from previous  note.  ALLERGIES:  is allergic to contrast media [iodinated diagnostic agents].  MEDICATIONS:  Current Outpatient Medications  Medication Sig Dispense Refill  . cholecalciferol (  VITAMIN D3) 25 MCG (1000 UNIT) tablet Take 1,000 Units by mouth daily.    . Cyanocobalamin (VITAMIN B 12 PO) Take by mouth.    . Multiple Vitamin (MULTIVITAMIN) tablet Take 1 tablet by mouth daily.     No current facility-administered medications for this visit.    PHYSICAL EXAMINATION: ECOG PERFORMANCE STATUS: 0 - Asymptomatic  Vitals:   07/07/20 1137  BP: (!) 148/83  Pulse: 74  Resp: 18  Temp: 97.8 F (36.6 C)  SpO2: 95%   Filed Weights   07/07/20 1137  Weight: 200 lb (90.7 kg)    GENERAL:alert, no distress and comfortable SKIN: skin color, texture, turgor are normal, no rashes or significant lesions EYES: normal, Conjunctiva are pink and non-injected, sclera clear OROPHARYNX:no exudate, no erythema and lips, buccal mucosa, and tongue normal  NECK: supple, thyroid normal size, non-tender, without nodularity LYMPH:  no palpable lymphadenopathy in the cervical, axillary or inguinal LUNGS: clear to auscultation and percussion with normal breathing effort HEART: regular rate & rhythm and no murmurs and no lower extremity edema ABDOMEN:abdomen soft, non-tender and normal bowel sounds Musculoskeletal:no cyanosis of digits and no clubbing  NEURO: alert & oriented x 3 with fluent speech, no focal motor/sensory deficits  LABORATORY DATA:  I have reviewed the data as listed    Component Value Date/Time   NA 140 07/07/2020 1110   NA 134 (L) 02/19/2017 0925   K 4.4 07/07/2020 1110   K 3.0 (LL) 02/19/2017 0925   CL 105 07/07/2020 1110   CO2 24 07/07/2020 1110   CO2 29 02/19/2017 0925   GLUCOSE 104 (H) 07/07/2020 1110   GLUCOSE 135 02/19/2017 0925   BUN 32 (H) 07/07/2020 1110   BUN 7.4 02/19/2017 0925   CREATININE 1.59 (H) 07/07/2020 1110   CREATININE 0.9 02/19/2017 0925   CALCIUM 9.6  07/07/2020 1110   CALCIUM 8.9 02/19/2017 0925   PROT 7.7 07/07/2020 1110   PROT 6.4 02/19/2017 0925   ALBUMIN 4.3 07/07/2020 1110   ALBUMIN 3.4 (L) 02/19/2017 0925   AST 17 07/07/2020 1110   AST 16 02/19/2017 0925   ALT 9 07/07/2020 1110   ALT 13 02/19/2017 0925   ALKPHOS 60 07/07/2020 1110   ALKPHOS 86 02/19/2017 0925   BILITOT 0.4 07/07/2020 1110   BILITOT 0.65 02/19/2017 0925   GFRNONAA 35 (L) 07/07/2020 1110   GFRAA 35 (L) 07/06/2019 1052    No results found for: SPEP, UPEP  Lab Results  Component Value Date   WBC 6.9 07/07/2020   NEUTROABS 4.5 07/07/2020   HGB 12.8 07/07/2020   HCT 39.4 07/07/2020   MCV 85.5 07/07/2020   PLT 263 07/07/2020      Chemistry      Component Value Date/Time   NA 140 07/07/2020 1110   NA 134 (L) 02/19/2017 0925   K 4.4 07/07/2020 1110   K 3.0 (LL) 02/19/2017 0925   CL 105 07/07/2020 1110   CO2 24 07/07/2020 1110   CO2 29 02/19/2017 0925   BUN 32 (H) 07/07/2020 1110   BUN 7.4 02/19/2017 0925   CREATININE 1.59 (H) 07/07/2020 1110   CREATININE 0.9 02/19/2017 0925      Component Value Date/Time   CALCIUM 9.6 07/07/2020 1110   CALCIUM 8.9 02/19/2017 0925   ALKPHOS 60 07/07/2020 1110   ALKPHOS 86 02/19/2017 0925   AST 17 07/07/2020 1110   AST 16 02/19/2017 0925   ALT 9 07/07/2020 1110   ALT 13 02/19/2017 0925   BILITOT  0.4 07/07/2020 1110   BILITOT 0.65 02/19/2017 0925

## 2020-07-07 NOTE — Assessment & Plan Note (Signed)
She has chronic kidney disease stage III due to nephrectomy I recommend aggressive blood pressure management I recommend follow-up with primary care doctor for follow-up 

## 2020-07-10 ENCOUNTER — Telehealth: Payer: Self-pay | Admitting: Hematology and Oncology

## 2020-07-10 NOTE — Telephone Encounter (Signed)
Scheduled per 5/20 sch msg. Called and spoke with pt, confirmed 5/22 appt

## 2020-08-04 ENCOUNTER — Other Ambulatory Visit: Payer: Self-pay

## 2020-08-04 ENCOUNTER — Ambulatory Visit (INDEPENDENT_AMBULATORY_CARE_PROVIDER_SITE_OTHER): Payer: Medicare Other | Admitting: Urology

## 2020-08-04 ENCOUNTER — Encounter: Payer: Self-pay | Admitting: Urology

## 2020-08-04 VITALS — BP 136/73 | HR 69

## 2020-08-04 DIAGNOSIS — C641 Malignant neoplasm of right kidney, except renal pelvis: Secondary | ICD-10-CM

## 2020-08-04 LAB — URINALYSIS, ROUTINE W REFLEX MICROSCOPIC
Bilirubin, UA: NEGATIVE
Glucose, UA: NEGATIVE
Ketones, UA: NEGATIVE
Nitrite, UA: NEGATIVE
Protein,UA: NEGATIVE
Specific Gravity, UA: 1.015 (ref 1.005–1.030)
Urobilinogen, Ur: 0.2 mg/dL (ref 0.2–1.0)
pH, UA: 6 (ref 5.0–7.5)

## 2020-08-04 LAB — MICROSCOPIC EXAMINATION: Bacteria, UA: NONE SEEN

## 2020-08-04 NOTE — Progress Notes (Signed)
Urological Symptom Review  Patient is experiencing the following symptoms: Frequent urination Get up at night to urinate   Review of Systems  Gastrointestinal (upper)  : Negative for upper GI symptoms  Gastrointestinal (lower) : Constipation  Constitutional : Fatigue  Skin: Itching  Eyes: Negative for eye symptoms  Ear/Nose/Throat : Sinus problems  Hematologic/Lymphatic: Negative for Hematologic/Lymphatic symptoms  Cardiovascular : Negative for cardiovascular symptoms  Respiratory : Negative for respiratory symptoms  Endocrine: Negative for endocrine symptoms  Musculoskeletal: Back pain Joint pain  Neurological: Negative for neurological symptoms  Psychologic: Negative for psychiatric symptoms

## 2020-08-04 NOTE — Progress Notes (Signed)
08/04/2020 2:34 PM   Janee Morn [id number] [id number]  Referring provider: Lemmie Evens, MD [place],  [place] [address]  Followup right [facility]   HPI: Ms Vasudevan is a 70JJ here for followup for right RCC s/p Right radical in 05/2018. Creatinine 1.59. CT from 12/2019 showed no evidence of metastatic disease. UA normal. No significant LUTS.    PMH: Past Medical History:  Diagnosis Date   Anemia    Arthritis    Chest wall mass 11/2016   Hypertension    Lymphadenopathy 11/2016   "extensive"   Non Hodgkin's lymphoma ([place]) 2019   Paralysis ([place])    12-13-2016   Paresthesia of lower extremity 11/2016   bilateral   Pulmonary nodules 11/2016   Renal cancer, right ([place]) dx'd 2019   Right renal mass 11/2016    Surgical History: Past Surgical History:  Procedure Laterality Date   ABDOMINAL HYSTERECTOMY     BREAST LUMPECTOMY     left   COLONOSCOPY N/A 11/02/2013   Procedure: COLONOSCOPY;  Surgeon: Danie Binder, MD;  [facility];  Service: Endoscopy;  Laterality: N/A;  1:15 - moved to 845 - Kim notified pt   COLONOSCOPY N/A 04/17/2018   Procedure: COLONOSCOPY;  Surgeon: Danie Binder, MD;  [facility];  Service: Endoscopy;  Laterality: N/A;  12:00   IR FLUORO GUIDE PORT INSERTION RIGHT  12/30/2016   IR REMOVAL TUN ACCESS W/ PORT W/O FL MOD SED  07/04/2017   IR US GUIDE VASC ACCESS RIGHT  12/30/2016   LAMINECTOMY N/A 12/18/2016   Procedure: DECOMPRESSION T5-8/THORACIC;  Surgeon: Kary Kos, MD;  Location: [facility];  Service: Neurosurgery;  Laterality: N/A;  Decompressive Thoracic Laminectomy, Thoracic five - six, Thoraic six - seven, Thoracic seven - eight   POLYPECTOMY  04/17/2018   Procedure: POLYPECTOMY;  Surgeon: Danie Binder, MD;  [facility]: [facility];  Service: Endoscopy;;  splenic flexure   ROBOT ASSISTED LAPAROSCOPIC NEPHRECTOMY Right 06/11/2018   Procedure: XI ROBOTIC ASSISTED LAPAROSCOPIC NEPHRECTOMY;  Surgeon: Cleon Gustin, MD;  Location: [facility];  Service: Urology;  Laterality: Right;  2.5 HRS   SHOULDER SURGERY     right    Home Medications:  Allergies as of 08/04/2020       Reactions   Contrast Media [iodinated Diagnostic Agents] Hives, Itching        Medication List        Accurate as of August 04, 2020  2:34 PM. If you have any questions, ask your nurse or doctor.          cholecalciferol 25 MCG (1000 UNIT) tablet Commonly known as: VITAMIN D3 Take 1,000 Units by mouth daily.   multivitamin tablet Take 1 tablet by mouth daily.   VITAMIN B 12 PO Take by mouth.        Allergies:  Allergies  Allergen Reactions   Contrast Media [Iodinated Diagnostic Agents] Hives and Itching    Family History: Family History  Problem Relation Age of Onset   Colon cancer Sister        at age 2   Colon cancer Brother        at age 26   COPD Mother    Diabetes Mellitus II Mother    CAD Mother        Angina   Colon cancer Father    Thyroid disease Sister    Diabetes Mellitus II Sister     Social History:  reports that she quit smoking about 24 years ago. Her smoking use included cigarettes. She has a 15.00 pack-year smoking history. She has never used smokeless tobacco. She reports that she does not drink alcohol and does not use drugs.  ROS: All other review of systems were reviewed and are negative except what is noted above in HPI  Physical Exam: BP 136/73   Pulse 69   Constitutional:  Alert and oriented, No acute distress. HEENT: [place] AT, moist mucus membranes.  Trachea midline, no masses. Cardiovascular: No clubbing, cyanosis, or edema. Respiratory: Normal respiratory effort, no increased work of breathing. GI: Abdomen is soft, nontender, nondistended, no abdominal masses GU: No CVA tenderness.  Lymph: No cervical or inguinal lymphadenopathy. Skin: No rashes, bruises or suspicious lesions. Neurologic: Grossly intact, no focal deficits, moving all 4  extremities. Psychiatric: Normal mood and affect.  Laboratory Data: Lab Results  Component Value Date   WBC 6.9 07/07/2020   HGB 12.8 07/07/2020   HCT 39.4 07/07/2020   MCV 85.5 07/07/2020   PLT 263 07/07/2020    Lab Results  Component Value Date   CREATININE 1.59 (H) 07/07/2020    No results found for: PSA  No results found for: TESTOSTERONE  No results found for: HGBA1C  Urinalysis    Component Value Date/Time   COLORURINE YELLOW 12/19/2016 0916   APPEARANCEUR CLOUDY (A) 12/19/2016 0916   LABSPEC 1.021 12/19/2016 0916   PHURINE 5.0 12/19/2016 0916   GLUCOSEU NEGATIVE 12/19/2016 0916   HGBUR NEGATIVE 12/19/2016 0916   BILIRUBINUR NEGATIVE 12/19/2016 0916   KETONESUR NEGATIVE 12/19/2016 0916   PROTEINUR NEGATIVE 12/19/2016 0916   NITRITE NEGATIVE 12/19/2016 0916   LEUKOCYTESUR SMALL (A) 12/19/2016 0916    Lab Results  Component Value Date   BACTERIA RARE (A) 12/19/2016    Pertinent Imaging: CT 12/2019: Images reviewed and discussed with the patient No results found for this or any previous visit.  No results found for this or any previous visit.  No results found for this or any previous visit.  No results found for this or any previous visit.  No results found for this or any previous visit.  No results found for this or any previous visit.  No results found for this or any previous visit.  No results found for this or any previous visit.   Assessment & Plan:    1. Cancer of right kidney ([place]) -RTC 6 months with CMP and CXR - Urinalysis, Routine w reflex microscopic   No follow-ups on file.  Nicolette Bang, MD  [facility] [place]

## 2020-12-06 ENCOUNTER — Ambulatory Visit (HOSPITAL_COMMUNITY)
Admission: RE | Admit: 2020-12-06 | Discharge: 2020-12-06 | Disposition: A | Discharge status: Home / Self Care | Payer: Medicare Other | Source: Ambulatory Visit | Attending: Urology | Admitting: Urology

## 2020-12-06 ENCOUNTER — Other Ambulatory Visit: Payer: Self-pay

## 2020-12-06 DIAGNOSIS — C641 Malignant neoplasm of right kidney, except renal pelvis: Secondary | ICD-10-CM | POA: Diagnosis not present

## 2020-12-08 ENCOUNTER — Other Ambulatory Visit (HOSPITAL_COMMUNITY): Payer: Self-pay | Admitting: Nurse Practitioner

## 2020-12-08 ENCOUNTER — Other Ambulatory Visit: Payer: Self-pay | Admitting: Nurse Practitioner

## 2020-12-08 DIAGNOSIS — R42 Dizziness and giddiness: Secondary | ICD-10-CM

## 2020-12-08 DIAGNOSIS — Z85528 Personal history of other malignant neoplasm of kidney: Secondary | ICD-10-CM

## 2020-12-22 ENCOUNTER — Ambulatory Visit (HOSPITAL_COMMUNITY)
Admission: RE | Admit: 2020-12-22 | Discharge: 2020-12-22 | Disposition: A | Discharge status: Home / Self Care | Payer: Medicare Other | Source: Ambulatory Visit | Attending: Nurse Practitioner | Admitting: Nurse Practitioner

## 2020-12-22 ENCOUNTER — Other Ambulatory Visit: Payer: Self-pay

## 2020-12-22 DIAGNOSIS — R42 Dizziness and giddiness: Secondary | ICD-10-CM | POA: Diagnosis present

## 2020-12-22 DIAGNOSIS — Z85528 Personal history of other malignant neoplasm of kidney: Secondary | ICD-10-CM | POA: Insufficient documentation

## 2021-01-31 ENCOUNTER — Other Ambulatory Visit: Payer: Medicare Other

## 2021-02-07 ENCOUNTER — Ambulatory Visit: Payer: Medicare Other | Admitting: Urology

## 2021-02-21 ENCOUNTER — Ambulatory Visit (HOSPITAL_COMMUNITY): Payer: Medicare Other | Attending: Nurse Practitioner

## 2021-02-21 ENCOUNTER — Other Ambulatory Visit: Payer: Self-pay

## 2021-02-21 DIAGNOSIS — R42 Dizziness and giddiness: Secondary | ICD-10-CM | POA: Diagnosis present

## 2021-02-21 NOTE — Therapy (Signed)
[place], [place], [address] Phone: [phone]   Fax:  [phone]  Physical Therapy Evaluation  Patient Details  Name: Katrina [name] MRN: [id number] Date of Birth: 1950/12/06 No data recorded  Encounter Date: 02/21/2021   PT End of Session - 02/21/21 1122     Visit Number 1    Number of Visits 4    Date for PT Re-Evaluation 03/21/21    Authorization Type UHC Medicare, no auth, VL by med. necessity    PT Start Time 1115    PT Stop Time 1200    PT Time Calculation (min) 45 min    Activity Tolerance Patient tolerated treatment well   minimal symptom provokation   Behavior During Therapy [facility] for tasks assessed/performed             Past Medical History:  Diagnosis Date   Anemia    Arthritis    Chest wall mass 11/2016   Hypertension    Lymphadenopathy 11/2016   "extensive"   Non Hodgkin's lymphoma ([place]) 2019   Paralysis ([place])    12-13-2016   Paresthesia of lower extremity 11/2016   bilateral   Pulmonary nodules 11/2016   Renal cancer, right ([place]) dx'd 2019   Right renal mass 11/2016    Past Surgical History:  Procedure Laterality Date   ABDOMINAL HYSTERECTOMY     BREAST LUMPECTOMY     left   COLONOSCOPY N/A 11/02/2013   Procedure: COLONOSCOPY;  Surgeon: Danie Binder, MD;  [facility];  Service: Endoscopy;  Laterality: N/A;  1:15 - moved to 845 - Kim notified pt   COLONOSCOPY N/A 04/17/2018   Procedure: COLONOSCOPY;  Surgeon: Danie Binder, MD;  [facility];  Service: Endoscopy;  Laterality: N/A;  12:00   IR FLUORO GUIDE PORT INSERTION RIGHT  12/30/2016   IR REMOVAL TUN ACCESS W/ PORT W/O FL MOD SED  07/04/2017   IR US GUIDE VASC ACCESS RIGHT  12/30/2016   LAMINECTOMY N/A 12/18/2016   Procedure: DECOMPRESSION T5-8/THORACIC;  Surgeon: Kary Kos, MD;  Location: [place];  Service: Neurosurgery;  Laterality: N/A;  Decompressive Thoracic Laminectomy, Thoracic five - six,  Thoraic six - seven, Thoracic seven - eight   POLYPECTOMY  04/17/2018   Procedure: POLYPECTOMY;  Surgeon: Danie Binder, MD;  [facility];  Service: Endoscopy;;  splenic flexure   ROBOT ASSISTED LAPAROSCOPIC NEPHRECTOMY Right 06/11/2018   Procedure: XI ROBOTIC ASSISTED LAPAROSCOPIC NEPHRECTOMY;  Surgeon: Cleon Gustin, MD;  [facility];  Service: Urology;  Laterality: Right;  2.5 HRS   SHOULDER SURGERY     right    There were no vitals filed for this visit.    Subjective Assessment - 02/21/21 1124     Subjective Pt notes "spells" x 6 months with nausea, swimmy-headed, clamminess, and sick to stomach and most notably when arising from bed in the night to use the bathroom.  Pt notes they sometimes occur in the day but she is usually sitting when these come about                        Vestibular Assessment - 02/21/21 0001       Symptom Behavior   Subjective history of current problem Most frequently brought on when arising from bed in the night and produced when supine to sit or sit to supine    Type of Dizziness  Lightheadedness;"Funny  feeling in head";Spinning    Frequency of Dizziness intermittent    Duration of Dizziness 1-2 min    Symptom Nature Motion provoked;Positional;Intermittent    Aggravating Factors Supine to sit    Relieving Factors Closing eyes    Progression of Symptoms Better      Oculomotor Exam   Oculomotor Alignment Normal    Spontaneous Absent    Gaze-induced  Right beating nystagmus with L gaze   x 1 occurence   Head shaking Horizontal Absent    Head Shaking Vertical Absent    Smooth Pursuits Intact    Saccades Intact      Positional Testing   Dix-Hallpike Dix-Hallpike Right;Dix-Hallpike Left    Horizontal Canal Testing Horizontal Canal Right;Horizontal Canal Left      Dix-Hallpike Right   Dix-Hallpike Right Symptoms No nystagmus      Dix-Hallpike Left   Dix-Hallpike Left Symptoms No nystagmus      Horizontal  Canal Right   Horizontal Canal Right Symptoms Normal      Horizontal Canal Left   Horizontal Canal Left Symptoms Normal      Cognition   Cognition Orientation Level Oriented x 4      Positional Sensitivities   Sit to Supine No dizziness    Supine to Sitting Mild dizziness                Objective measurements completed on examination: See above findings.                PT Education - 02/21/21 1227     Education Details education/discussion regarding cataloging/journaling time, position, and symptoms for further assessment    Person(s) Educated Patient    Methods Explanation    Comprehension Verbalized understanding              PT Short Term Goals - 02/21/21 1232       PT SHORT TERM GOAL #1   Title Patient will remain free of vertigo symptoms    Time 2    Period Weeks    Status New    Target Date 03/07/21               PT Long Term Goals - 02/21/21 1232       PT LONG TERM GOAL #1   Title Demo independence in self-positioning/maneuvering, and if necessary demo independence in habituation exercises to reduce vertigo symptoms    Time 4    Period Weeks    Status New    Target Date 03/21/21                    Plan - 02/21/21 1228     Clinical Impression Statement Pt is 71 yo lady with subjective report consistent with symptoms of BPPV. Unable to elicit specific response during testing today with most notable provocation being with Brandt-Daroff maneuver sidelying to the left.  Continued sessions indicated to identify symptomology and positional bias and provide requisite liberatory maneuver to reduce symptoms of vertigo.    Personal Factors and Comorbidities Time since onset of injury/illness/exacerbation    Examination-Activity Limitations Bed Mobility;Transfers    Stability/Clinical Decision Making Stable/Uncomplicated    Clinical Decision Making Low    Rehab Potential Good    PT Frequency 1x / week    PT Duration 4 weeks     PT Treatment/Interventions ADLs/Self Care Home Management;Gait training;Balance training;Therapeutic activities;Therapeutic exercise;Manual techniques;Vestibular    PT Next Visit Plan re-assess Dix-Hallpike, Horizontal canal    PT  Home Exercise Plan journal for time, activity, and position when symptoms provoked    Consulted and Agree with Plan of Care Patient             Patient will benefit from skilled therapeutic intervention in order to improve the following deficits and impairments:  Dizziness  Visit Diagnosis: Dizziness and giddiness     Problem List Patient Active Problem List   Diagnosis Date Noted   Anemia in chronic kidney disease 07/07/2018   Cancer of right kidney ([place]) 06/26/2018   CKD (chronic kidney disease), stage III ([place]) 06/26/2018   Renal mass 06/11/2018   Personal history of colonic polyps    Kidney lesion, native, right 05/16/2017   Contrast media allergy 05/16/2017   Chronic nausea 04/18/2017   Anemia due to antineoplastic chemotherapy 01/31/2017   Diarrhea 01/31/2017   Dehydration 01/31/2017   Hypokalemia 01/08/2017   AKI (acute kidney injury) ([place])    Hyponatremia    Acute blood loss anemia    Benign essential HTN    Thoracic myelopathy 12/26/2016   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   Paraparesis ([place]) 12/19/2016   Chest wall mass 12/19/2016   Mass in epidural space    S/P lumbar laminectomy 12/18/2016   Numbness and tingling 12/13/2016   Numbness and tingling of both lower extremities 12/12/2016   Hypertension 12/12/2016   Anemia 12/12/2016   Abdominal lymphadenopathies 12/12/2016   Renal mass, right 12/12/2016   Constipation 12/12/2016   FH: colon cancer 10/22/2013   RUQ discomfort 10/22/2013    Toniann Fail, PT 02/21/2021, 12:34 PM  [facility] [address] [place], [place], [address] Phone: [phone]   Fax:  [phone]  Name: Katrina [name] MRN: [id number] Date  of Birth: May 21, 1950

## 2021-02-28 ENCOUNTER — Ambulatory Visit (HOSPITAL_COMMUNITY): Payer: Medicare Other | Admitting: Physical Therapy

## 2021-02-28 ENCOUNTER — Other Ambulatory Visit: Payer: Self-pay

## 2021-02-28 DIAGNOSIS — R42 Dizziness and giddiness: Secondary | ICD-10-CM

## 2021-02-28 NOTE — Therapy (Signed)
[place], [place], [address] Phone: [phone]   Fax:  [phone]  Physical Therapy Treatment  Patient Details  Name: Katrina [name] MRN: [id number] Date of Birth: 03/05/1950 No data recorded  Encounter Date: 02/28/2021   PT End of Session - 02/28/21 0951     Visit Number 2    Number of Visits 4    Date for PT Re-Evaluation 03/21/21    Authorization Type UHC Medicare, no auth, VL by med. necessity    PT Start Time 0915    PT Stop Time 0955    PT Time Calculation (min) 40 min    Activity Tolerance Patient tolerated treatment well   minimal symptom provokation   Behavior During Therapy [facility] for tasks assessed/performed             Past Medical History:  Diagnosis Date   Anemia    Arthritis    Chest wall mass 11/2016   Hypertension    Lymphadenopathy 11/2016   "extensive"   Non Hodgkin's lymphoma ([place]) 2019   Paralysis ([place])    12-13-2016   Paresthesia of lower extremity 11/2016   bilateral   Pulmonary nodules 11/2016   Renal cancer, right ([place]) dx'd 2019   Right renal mass 11/2016    Past Surgical History:  Procedure Laterality Date   ABDOMINAL HYSTERECTOMY     BREAST LUMPECTOMY     left   COLONOSCOPY N/A 11/02/2013   Procedure: COLONOSCOPY;  Surgeon: Danie Binder, MD;  [facility];  Service: Endoscopy;  Laterality: N/A;  1:15 - moved to 845 - Kim notified pt   COLONOSCOPY N/A 04/17/2018   Procedure: COLONOSCOPY;  Surgeon: Danie Binder, MD;  [facility];  Service: Endoscopy;  Laterality: N/A;  12:00   IR FLUORO GUIDE PORT INSERTION RIGHT  12/30/2016   IR REMOVAL TUN ACCESS W/ PORT W/O FL MOD SED  07/04/2017   IR US GUIDE VASC ACCESS RIGHT  12/30/2016   LAMINECTOMY N/A 12/18/2016   Procedure: DECOMPRESSION T5-8/THORACIC;  Surgeon: Kary Kos, MD;  Location: [place];  Service: Neurosurgery;  Laterality: N/A;  Decompressive Thoracic Laminectomy, Thoracic five - six,  Thoraic six - seven, Thoracic seven - eight   POLYPECTOMY  04/17/2018   Procedure: POLYPECTOMY;  Surgeon: Danie Binder, MD;  [facility];  Service: Endoscopy;;  splenic flexure   ROBOT ASSISTED LAPAROSCOPIC NEPHRECTOMY Right 06/11/2018   Procedure: XI ROBOTIC ASSISTED LAPAROSCOPIC NEPHRECTOMY;  Surgeon: Cleon Gustin, MD;  [facility];  Service: Urology;  Laterality: Right;  2.5 HRS   SHOULDER SURGERY     right    There were no vitals filed for this visit.   Subjective Assessment - 02/28/21 0917     Subjective Pt states that she has more dizziness when she turns to her Rt side.    Currently in Pain? No/denies                     Vestibular Assessment - 02/28/21 0001       Positional Testing   Dix-Hallpike Dix-Hallpike Right;Dix-Hallpike Left    Horizontal Canal Testing Horizontal Canal Right;Horizontal Canal Left      Dix-Hallpike Right   Dix-Hallpike Right Symptoms No nystagmus      Dix-Hallpike Left   Dix-Hallpike Left Symptoms No nystagmus      Positional Sensitivities   Up from Left Hallpike Lightheadedness  Vestibular Treatment/Exercise - 02/28/21 0001       Vestibular Treatment/Exercise   Vestibular Treatment Provided Habituation    Habituation Exercises Laruth Bouchard Daroff;Horizontal Roll;Seated Horizontal Head Turns      Nestor Lewandowsky   Number of Reps  5    Symptom Description  to Lt only  x 2      Horizontal Roll   Number of Reps  5   x 2 to Rt and LT   Symptom Description  slight dizziness      Seated Horizontal Head Turns   Number of Reps  5    Symptom Description  minimal                      PT Short Term Goals - 02/28/21 1003       PT SHORT TERM GOAL #1   Title Patient will remain free of vertigo symptoms    Time 2    Period Weeks    Status On-going    Target Date 03/07/21               PT Long Term Goals - 02/28/21 1003       PT LONG TERM GOAL #1    Title Demo independence in self-positioning/maneuvering, and if necessary demo independence in habituation exercises to reduce vertigo symptoms    Time 4    Period Weeks    Status On-going    Target Date 03/21/21                   Plan - 02/28/21 0952     Clinical Impression Statement Therapist retested for BPPV which was Negative for all canals.  Began pt on habituation exercises for those motions that provoked symptoms.    Personal Factors and Comorbidities Time since onset of injury/illness/exacerbation    Examination-Activity Limitations Bed Mobility;Transfers    Stability/Clinical Decision Making Stable/Uncomplicated    Rehab Potential Good    PT Frequency 1x / week    PT Duration 4 weeks    PT Treatment/Interventions ADLs/Self Care Home Management;Gait training;Balance training;Therapeutic activities;Therapeutic exercise;Manual techniques;Vestibular    PT Next Visit Plan Assess how habituation is doing for sx.  See if sit to stand, standing head rotation, standing 90/180 degree pivot bring on sx and continue with habituation.  Check eye gaze to see if this brings on sx if so add to [place] and Agree with Plan of Care Patient             Patient will benefit from skilled therapeutic intervention in order to improve the following deficits and impairments:  Dizziness  Visit Diagnosis: Dizziness and giddiness     Problem List Patient Active Problem List   Diagnosis Date Noted   Anemia in chronic kidney disease 07/07/2018   Cancer of right kidney ([place]) 06/26/2018   CKD (chronic kidney disease), stage III ([place]) 06/26/2018   Renal mass 06/11/2018   Personal history of colonic polyps    Kidney lesion, native, right 05/16/2017   Contrast media allergy 05/16/2017   Chronic nausea 04/18/2017   Anemia due to antineoplastic chemotherapy 01/31/2017   Diarrhea 01/31/2017   Dehydration 01/31/2017   Hypokalemia 01/08/2017    AKI (acute kidney injury) ([place])    Hyponatremia    Acute blood loss anemia    Benign essential HTN    Thoracic myelopathy 12/26/2016   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) 12/19/2016   Chest wall mass 12/19/2016   Mass in epidural space    S/P lumbar laminectomy 12/18/2016   Numbness and tingling 12/13/2016   Numbness and tingling of both lower extremities 12/12/2016   Hypertension 12/12/2016   Anemia 12/12/2016   Abdominal lymphadenopathies 12/12/2016   Renal mass, right 12/12/2016   Constipation 12/12/2016   FH: colon cancer 10/22/2013   RUQ discomfort 10/22/2013   Rayetta Humphrey, PT CLT [phone]  02/28/2021, 10:04 AM  [place], [place], [address] Phone: [phone]   Fax:  [phone]  Name: Katrina [name] MRN: [id number] Date of Birth: Dec 27, 1950

## 2021-03-07 ENCOUNTER — Other Ambulatory Visit: Payer: Self-pay

## 2021-03-07 ENCOUNTER — Ambulatory Visit (HOSPITAL_COMMUNITY): Payer: Medicare Other

## 2021-03-07 ENCOUNTER — Other Ambulatory Visit: Payer: Medicare Other

## 2021-03-07 DIAGNOSIS — R42 Dizziness and giddiness: Secondary | ICD-10-CM

## 2021-03-07 DIAGNOSIS — C641 Malignant neoplasm of right kidney, except renal pelvis: Secondary | ICD-10-CM

## 2021-03-07 DIAGNOSIS — C8330 Diffuse large B-cell lymphoma, unspecified site: Secondary | ICD-10-CM

## 2021-03-07 NOTE — Therapy (Signed)
[place], [place], [address] Phone: [phone]   Fax:  [phone]  Physical Therapy Treatment  Patient Details  Name: Katrina [name] MRN: [id number] Date of Birth: 1950-04-06 No data recorded  Encounter Date: 03/07/2021   PT End of Session - 03/07/21 1256     Visit Number 3    Number of Visits 4    Date for PT Re-Evaluation 03/21/21    Authorization Type UHC Medicare, no auth, VL by med. necessity    PT Start Time 1300    PT Stop Time 1345    PT Time Calculation (min) 45 min    Activity Tolerance Patient tolerated treatment well   minimal symptom provokation   Behavior During Therapy [facility] for tasks assessed/performed             Past Medical History:  Diagnosis Date   Anemia    Arthritis    Chest wall mass 11/2016   Hypertension    Lymphadenopathy 11/2016   "extensive"   Non Hodgkin's lymphoma ([place]) 2019   Paralysis ([place])    12-13-2016   Paresthesia of lower extremity 11/2016   bilateral   Pulmonary nodules 11/2016   Renal cancer, right ([place]) dx'd 2019   Right renal mass 11/2016    Past Surgical History:  Procedure Laterality Date   ABDOMINAL HYSTERECTOMY     BREAST LUMPECTOMY     left   COLONOSCOPY N/A 11/02/2013   Procedure: COLONOSCOPY;  Surgeon: Danie Binder, MD;  [facility];  Service: Endoscopy;  Laterality: N/A;  1:15 - moved to 845 - Kim notified pt   COLONOSCOPY N/A 04/17/2018   Procedure: COLONOSCOPY;  Surgeon: Danie Binder, MD;  [facility];  Service: Endoscopy;  Laterality: N/A;  12:00   IR FLUORO GUIDE PORT INSERTION RIGHT  12/30/2016   IR REMOVAL TUN ACCESS W/ PORT W/O FL MOD SED  07/04/2017   IR US GUIDE VASC ACCESS RIGHT  12/30/2016   LAMINECTOMY N/A 12/18/2016   Procedure: DECOMPRESSION T5-8/THORACIC;  Surgeon: Kary Kos, MD;  Location: [place];  Service: Neurosurgery;  Laterality: N/A;  Decompressive Thoracic Laminectomy, Thoracic five - six,  Thoraic six - seven, Thoracic seven - eight   POLYPECTOMY  04/17/2018   Procedure: POLYPECTOMY;  Surgeon: Danie Binder, MD;  [facility];  Service: Endoscopy;;  splenic flexure   ROBOT ASSISTED LAPAROSCOPIC NEPHRECTOMY Right 06/11/2018   Procedure: XI ROBOTIC ASSISTED LAPAROSCOPIC NEPHRECTOMY;  Surgeon: Cleon Gustin, MD;  [facility];  Service: Urology;  Laterality: Right;  2.5 HRS   SHOULDER SURGERY     right    There were no vitals filed for this visit.   Subjective Assessment - 03/07/21 1303     Subjective Has only experienced one episode in the past week of "swimmy-headed". Been completing the habituation activites 3x/day.                     Vestibular Assessment - 03/07/21 0001       Positional Testing   Dix-Hallpike Dix-Hallpike Right;Dix-Hallpike Left      Dix-Hallpike Right   Dix-Hallpike Right Symptoms No nystagmus      Dix-Hallpike Left   Dix-Hallpike Left Symptoms No nystagmus      Positional Sensitivities   Sit to Supine No dizziness    Supine to Sitting No dizziness    Right Hallpike No dizziness    Up from Right  Hallpike No dizziness    Up from Left Hallpike No dizziness                       Vestibular Treatment/Exercise - 03/07/21 0001       Vestibular Treatment/Exercise   Vestibular Treatment Provided Habituation    Habituation Exercises Brandt Daroff;Horizontal Roll;Seated Horizontal Head Turns    Gaze Exercises Eye/Head Exercise Horizontal;X1 Viewing Horizontal;X1 Viewing Vertical      X1 Viewing Horizontal   Foot Position seated    Reps 30      X1 Viewing Vertical   Foot Position seated    Reps 30      Eye/Head Exercise Horizontal   Foot Position seated    Reps 30                      PT Short Term Goals - 02/28/21 1003       PT SHORT TERM GOAL #1   Title Patient will remain free of vertigo symptoms    Time 2    Period Weeks    Status On-going    Target Date 03/07/21                PT Long Term Goals - 02/28/21 1003       PT LONG TERM GOAL #1   Title Demo independence in self-positioning/maneuvering, and if necessary demo independence in habituation exercises to reduce vertigo symptoms    Time 4    Period Weeks    Status On-going    Target Date 03/21/21                   Plan - 03/07/21 1347     Clinical Impression Statement Re-tested with Dix-Hallpike and negative symptoms with all positioning appreciated and no feeling of dizziness or "swimmy-headed" with any position changes.  Pt tolerating habituation exercises without incident and addition of  gaze stabilization activities introduced today without any ill effect. Will continue for additional tx session to assess for any new symptoms and will likely be able to D/C at this time if no symptoms manifest    Personal Factors and Comorbidities Time since onset of injury/illness/exacerbation    Examination-Activity Limitations Bed Mobility;Transfers    Stability/Clinical Decision Making Stable/Uncomplicated    Rehab Potential Good    PT Frequency 1x / week    PT Duration 4 weeks    PT Treatment/Interventions ADLs/Self Care Home Management;Gait training;Balance training;Therapeutic activities;Therapeutic exercise;Manual techniques;Vestibular    PT Next Visit Plan Assess how habituation is doing for sx.  See if sit to stand, standing head rotation, standing 90/180 degree pivot bring on sx and continue with habituation.  Check eye gaze to see if this brings on sx if so add to [place] and Agree with Plan of Care Patient             Patient will benefit from skilled therapeutic intervention in order to improve the following deficits and impairments:  Dizziness  Visit Diagnosis: Dizziness and giddiness     Problem List Patient Active Problem List   Diagnosis Date Noted   Anemia in chronic kidney disease 07/07/2018   Cancer of right  kidney ([place]) 06/26/2018   CKD (chronic kidney disease), stage III ([place]) 06/26/2018   Renal mass 06/11/2018   Personal history of colonic polyps    Kidney lesion, native, right 05/16/2017   Contrast  media allergy 05/16/2017   Chronic nausea 04/18/2017   Anemia due to antineoplastic chemotherapy 01/31/2017   Diarrhea 01/31/2017   Dehydration 01/31/2017   Hypokalemia 01/08/2017   AKI (acute kidney injury) ([place])    Hyponatremia    Acute blood loss anemia    Benign essential HTN    Thoracic myelopathy 12/26/2016   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 [place], [place], [address] Phone: [phone]   Fax:  [phone]  Name: Katrina [name] MRN: [id number] Date of Birth: March 19, 1950

## 2021-03-08 LAB — COMPREHENSIVE METABOLIC PANEL
ALT: 10 IU/L (ref 0–32)
AST: 16 IU/L (ref 0–40)
Albumin/Globulin Ratio: 1.6 (ref 1.2–2.2)
Albumin: 4.5 g/dL (ref 3.8–4.8)
Alkaline Phosphatase: 68 IU/L (ref 44–121)
BUN/Creatinine Ratio: 18 (ref 12–28)
BUN: 24 mg/dL (ref 8–27)
Bilirubin Total: 0.4 mg/dL (ref 0.0–1.2)
CO2: 22 mmol/L (ref 20–29)
Calcium: 9.7 mg/dL (ref 8.7–10.3)
Chloride: 99 mmol/L (ref 96–106)
Creatinine, Ser: 1.3 mg/dL — ABNORMAL HIGH (ref 0.57–1.00)
Globulin, Total: 2.8 g/dL (ref 1.5–4.5)
Glucose: 106 mg/dL — ABNORMAL HIGH (ref 70–99)
Potassium: 4.8 mmol/L (ref 3.5–5.2)
Sodium: 141 mmol/L (ref 134–144)
Total Protein: 7.3 g/dL (ref 6.0–8.5)
eGFR: 45 mL/min/{1.73_m2} — ABNORMAL LOW (ref >59)

## 2021-03-12 ENCOUNTER — Ambulatory Visit: Payer: Medicare Other | Admitting: Urology

## 2021-03-14 ENCOUNTER — Ambulatory Visit (INDEPENDENT_AMBULATORY_CARE_PROVIDER_SITE_OTHER): Payer: Medicare Other | Admitting: Urology

## 2021-03-14 ENCOUNTER — Encounter: Payer: Self-pay | Admitting: Urology

## 2021-03-14 ENCOUNTER — Other Ambulatory Visit: Payer: Self-pay

## 2021-03-14 ENCOUNTER — Ambulatory Visit (HOSPITAL_COMMUNITY): Payer: Medicare Other

## 2021-03-14 VITALS — BP 133/73 | HR 80 | Ht 68.0 in | Wt 198.0 lb

## 2021-03-14 DIAGNOSIS — C641 Malignant neoplasm of right kidney, except renal pelvis: Secondary | ICD-10-CM | POA: Diagnosis not present

## 2021-03-14 DIAGNOSIS — R42 Dizziness and giddiness: Secondary | ICD-10-CM | POA: Diagnosis not present

## 2021-03-14 LAB — URINALYSIS, ROUTINE W REFLEX MICROSCOPIC
Bilirubin, UA: NEGATIVE
Glucose, UA: NEGATIVE
Ketones, UA: NEGATIVE
Nitrite, UA: NEGATIVE
Protein,UA: NEGATIVE
RBC, UA: NEGATIVE
Specific Gravity, UA: 1.015 (ref 1.005–1.030)
Urobilinogen, Ur: 0.2 mg/dL (ref 0.2–1.0)
pH, UA: 6.5 (ref 5.0–7.5)

## 2021-03-14 LAB — MICROSCOPIC EXAMINATION
Bacteria, UA: NONE SEEN
RBC, Urine: NONE SEEN /hpf (ref 0–2)
Renal Epithel, UA: NONE SEEN /hpf

## 2021-03-14 NOTE — Therapy (Signed)
[facility] [address] [place], [place], [address] Phone: [phone]   Fax:  [phone]  Physical Therapy Treatment and D/C Summary  Patient Details  Name: Katrina [name] MRN: [id number] Date of Birth: [birth year] No data recorded PHYSICAL THERAPY DISCHARGE SUMMARY  Visits from Start of Care: 4  Current functional level related to goals / functional outcomes: STG/LTG met   Remaining deficits: none   Education / Equipment: HEP   Patient agrees to discharge. Patient goals were not met. Patient is being discharged due to meeting the stated rehab goals.  Encounter Date: 03/14/2021   PT End of Session - 03/14/21 1302     Visit Number 4    Number of Visits 4    Date for PT Re-Evaluation 03/21/21    Authorization Type UHC Medicare, no auth, VL by med. necessity    PT Start Time 1300    PT Stop Time 1330   D/C assessment and tx   PT Time Calculation (min) 30 min    Activity Tolerance Patient tolerated treatment well   minimal symptom provokation   Behavior During Therapy [facility] for tasks assessed/performed             Past Medical History:  Diagnosis Date   Anemia    Arthritis    Chest wall mass 11/2016   Hypertension    Lymphadenopathy 11/2016   "extensive"   Non Hodgkin's lymphoma ([place]) 2019   Paralysis ([place])    12-13-2016   Paresthesia of lower extremity 11/2016   bilateral   Pulmonary nodules 11/2016   Renal cancer, right ([place]) dx'd 2019   Right renal mass 11/2016    Past Surgical History:  Procedure Laterality Date   ABDOMINAL HYSTERECTOMY     BREAST LUMPECTOMY     left   COLONOSCOPY N/A 11/02/2013   Procedure: COLONOSCOPY;  Surgeon: Danie Binder, MD;  [facility];  Service: Endoscopy;  Laterality: N/A;  1:15 - moved to 845 - Kim notified pt   COLONOSCOPY N/A 04/17/2018   Procedure: COLONOSCOPY;  Surgeon: Danie Binder, MD;  [facility];  Service: Endoscopy;  Laterality: N/A;   12:00   IR FLUORO GUIDE PORT INSERTION RIGHT  12/30/2016   IR REMOVAL TUN ACCESS W/ PORT W/O FL MOD SED  07/04/2017   IR US GUIDE VASC ACCESS RIGHT  12/30/2016   LAMINECTOMY N/A 12/18/2016   Procedure: DECOMPRESSION T5-8/THORACIC;  Surgeon: Kary Kos, MD;  Location: [place];  Service: Neurosurgery;  Laterality: N/A;  Decompressive Thoracic Laminectomy, Thoracic five - six, Thoraic six - seven, Thoracic seven - eight   POLYPECTOMY  04/17/2018   Procedure: POLYPECTOMY;  Surgeon: Danie Binder, MD;  [facility];  Service: Endoscopy;;  splenic flexure   ROBOT ASSISTED LAPAROSCOPIC NEPHRECTOMY Right 06/11/2018   Procedure: XI ROBOTIC ASSISTED LAPAROSCOPIC NEPHRECTOMY;  Surgeon: Cleon Gustin, MD;  [facility];  Service: Urology;  Laterality: Right;  2.5 HRS   SHOULDER SURGERY     right    There were no vitals filed for this visit.   Subjective Assessment - 03/14/21 1306     Subjective Pt notes overall marked improvement in symptoms with very vague hints of "swimmy-headed" and notes no issues when arising at night and no issue when rolling in bed  Vestibular Treatment/Exercise - 03/14/21 0001       Vestibular Treatment/Exercise   Vestibular Treatment Provided Habituation    Habituation Exercises Brandt Daroff;Horizontal Roll;Seated Horizontal Head Turns    Gaze Exercises Eye/Head Exercise Horizontal;X1 Viewing Horizontal;X1 Viewing Vertical      X1 Viewing Horizontal   Foot Position standing    Reps 30      X1 Viewing Vertical   Foot Position standing    Reps 30      Eye/Head Exercise Horizontal   Foot Position standing    Reps 30                      PT Short Term Goals - 03/14/21 1330       PT SHORT TERM GOAL #1   Title Patient will remain free of vertigo symptoms    Time 2    Period Weeks    Status Achieved    Target Date 03/07/21               PT Long Term Goals - 03/14/21  1331       PT LONG TERM GOAL #1   Title Demo independence in self-positioning/maneuvering, and if necessary demo independence in habituation exercises to reduce vertigo symptoms    Time 4    Period Weeks    Status Achieved    Target Date 03/21/21                   Plan - 03/14/21 1330     Clinical Impression Statement Pt notes resolution of symptoms and no vertigo attacks since beginning tx sessions. Demo independnece in self-mgmt. Demo progression of VOR activities in standing and progressing to performed while moving    Personal Factors and Comorbidities Time since onset of injury/illness/exacerbation    Examination-Activity Limitations Bed Mobility;Transfers    Stability/Clinical Decision Making Stable/Uncomplicated    Rehab Potential Good    PT Frequency 1x / week    PT Duration 4 weeks    PT Treatment/Interventions ADLs/Self Care Home Management;Gait training;Balance training;Therapeutic activities;Therapeutic exercise;Manual techniques;Vestibular    PT Next Visit Plan Assess how habituation is doing for sx.  See if sit to stand, standing head rotation, standing 90/180 degree pivot bring on sx and continue with habituation.  Check eye gaze to see if this brings on sx if so add to [place] and Agree with Plan of Care Patient             Patient will benefit from skilled therapeutic intervention in order to improve the following deficits and impairments:  Dizziness  Visit Diagnosis: Dizziness and giddiness     Problem List Patient Active Problem List   Diagnosis Date Noted   Anemia in chronic kidney disease 07/07/2018   Cancer of right kidney ([place]) 06/26/2018   CKD (chronic kidney disease), stage III ([employer]) 06/26/2018   Renal mass 06/11/2018   Personal history of colonic polyps    Kidney lesion, native, right 05/16/2017   Contrast media allergy 05/16/2017   Chronic nausea 04/18/2017   Anemia due to antineoplastic  chemotherapy 01/31/2017   Diarrhea 01/31/2017   Dehydration 01/31/2017   Hypokalemia 01/08/2017   AKI (acute kidney injury) ([place])    Hyponatremia    Acute blood loss anemia    Benign essential HTN    Thoracic myelopathy 12/26/2016   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   Paraparesis ([place])  12/19/2016   Chest wall mass 12/19/2016   Mass in epidural space    S/P lumbar laminectomy 12/18/2016   Numbness and tingling 12/13/2016   Numbness and tingling of both lower extremities 12/12/2016   Hypertension 12/12/2016   Anemia 12/12/2016   Abdominal lymphadenopathies 12/12/2016   Renal mass, right 12/12/2016   Constipation 12/12/2016   FH: colon cancer 10/22/2013   RUQ discomfort 10/22/2013    Toniann Fail, PT 03/14/2021, 1:33 PM  [facility] [address] [place], [place], [address] Phone: [phone]   Fax:  [phone]  Name: ELLISHA BANKSON MRN: [id number] Date of Birth: 09-20-1950

## 2021-03-14 NOTE — Patient Instructions (Signed)
Kidney Cancer ?Kidney cancer is an abnormal growth of cells in one or both kidneys. The kidneys filter waste from your blood and produce urine. Kidney cancer may spread to other parts of your body. This type of cancer may also be called renal cell carcinoma. ?What are the causes? ?The cause of this condition is not always known. In some cases, abnormal changes to genes (genetic mutations) can cause cells to form cancer. ?What increases the risk? ?You may be more likely to develop kidney cancer if you: ?Are over age 60. The risk increases with age. ?Have a family history of kidney cancer. ?Are of African-American, Native American, or Native [place] descent. ?Smoke. ?Are female. ?Are obese. ?Have high blood pressure (hypertension). ?Have advanced kidney disease, especially if you need long-term dialysis. ?Have certain conditions that are passed from parent to child (inherited), such as von Hippel-Lindau disease, tuberous sclerosis, or hereditary papillary renal carcinoma. ?Have been exposed to certain chemicals. ?What are the signs or symptoms? ?In the early stages, kidney cancer does not cause symptoms. As the cancer grows, symptoms may include: ?Blood in the urine. ?Pain in the upper back or abdomen, just below the rib cage. You may feel pain on one or both sides of the body. ?Fatigue. ?Unexplained weight loss. ?Fever. ?How is this diagnosed? ?This condition may be diagnosed based on: ?Your symptoms and medical history. ?A physical exam. ?Blood and urine tests. ?X-rays. ?Imaging tests, such as CT scans, MRIs, and PET scans. ?Having dye injected into your blood through an IV, and then having X-rays taken of: ?Your kidneys and the rest of the organs involved in making and storing urine (intravenous pyelogram). ?Your blood vessels (angiogram). ?Removal and testing of a kidney tissue sample (biopsy). ?Your cancer will be assessed (staged), based on how severe it is and how much it has spread. ?How is this  treated? ?Treatment depends on the type and stage of the cancer. Treatment may include one or more of the following: ?Surgery. This may include surgery to remove: ?Just the tumor (nephron-sparing surgery). ?The entire kidney (nephrectomy). ?The kidney, some of the surrounding healthy tissue, nearby lymph nodes, and the adrenal gland in certain cases (radical nephrectomy). ?Medicines that kill cancer cells (chemotherapy). ?High-energy rays that kill cancer cells (radiation therapy). ?Targeted therapy. This targets specific parts of cancer cells and the area around them to block the growth and the spread of the cancer. Targeted therapy can help to limit the damage to healthy cells. ?Medicines that help your body's disease-fighting system (immune system) fight cancer cells (immunotherapy). ?Freezing cancer cells using gas or liquid that is delivered through a needle (cryoablation). ?Destroying cancer cells using high-energy radio waves that are delivered through a needle-like probe (radiofrequency ablation). ?A procedure to block the artery that supplies blood to the tumor, which kills the cancer cells (embolization). ?Follow these instructions at home: ?Eating and drinking ?Some of your treatments might affect your appetite and your ability to chew and swallow. If you are having problems eating, or if you do not have an appetite, meet with a diet and nutrition specialist (dietitian). ?If you have side effects that affect eating, it may help to: ?Eat smaller meals and snacks often. ?Drink high-nutrition and high-calorie shakes or supplements. ?Eat bland and soft foods that are easy to eat. ?Not eat foods that are hot, spicy, or hard to swallow. ?Lifestyle ?Do not drink alcohol. ?Do not use any products that contain nicotine or tobacco, such as cigarettes and e-cigarettes. If you need help   quitting, ask your health care provider. ?General instructions ? ?Take over-the-counter and prescription medicines only as told by  your health care provider. This includes vitamins, supplements, and herbal products. ?Consider joining a support group to help you cope with the stress of having kidney cancer. ?Work with your health care provider to manage any side effects of treatment. ?Keep all follow-up visits as told by your health care provider. This is important. ?Where to find more information ?American Cancer Society: https://www.cancer.org ?National Cancer Institute (NCI): https://www.cancer.gov ?Contact a health care provider if you: ?Notice that you bruise or bleed easily. ?Are losing weight without trying. ?Have new or increased fatigue or weakness. ?Get help right away if you have: ?Blood in your urine. ?A sudden increase in pain. ?A fever. ?Shortness of breath. ?Chest pain. ?Yellow skin or whites of your eyes (jaundice). ?Summary ?Kidney cancer is an abnormal growth of cells (tumor) in one or both kidneys. Tumors may spread to other parts of your body. ?In the early stages, kidney cancer does not cause symptoms. As the cancer grows, symptoms may include blood in the urine, pain in the upper back or abdomen, unexplained weight loss, fatigue, and fever. ?Treatment depends on the type and stage of the cancer. It may include surgery to remove the tumor, procedures and medicines to kill the cancer cells, or medicines to help your body fight cancer cells. ?This information is not intended to replace advice given to you by your health care provider. Make sure you discuss any questions you have with your health care provider. ?Document Revised: 09/26/2020 Document Reviewed: 09/26/2020 ?Elsevier Patient Education ? 2022 Elsevier Inc. ? ?

## 2021-03-14 NOTE — Progress Notes (Signed)
Urological Symptom Review  Patient is experiencing the following symptoms: Get up at night to urinate   Review of Systems  Gastrointestinal (upper)  : Indigestion/heartburn  Gastrointestinal (lower) : Constipation  Constitutional : Negative for symptoms  Skin: Negative for skin symptoms  Eyes: Negative for eye symptoms  Ear/Nose/Throat : Negative for Ear/Nose/Throat symptoms  Hematologic/Lymphatic: Negative for Hematologic/Lymphatic symptoms  Cardiovascular : Negative for cardiovascular symptoms  Respiratory : Negative for respiratory symptoms  Endocrine: Negative for endocrine symptoms  Musculoskeletal: Joint pain  Neurological: Dizziness  Psychologic: Negative for psychiatric symptoms

## 2021-03-14 NOTE — Progress Notes (Signed)
03/14/2021 9:21 AM   Katrina [name] [birth year] [id number]  Referring provider: Lemmie Evens, MD [place],  [place] [address]  Followup right RCC   HPI: Ms Sponsel is a [phone] here for followup for right RCC. Creatinine 1.3. She drinks 64oz of water daily. CXR from 10/19 was normal. She denies any LUTS. No flank pain. No other complaitns today   PMH: Past Medical History:  Diagnosis Date   Anemia    Arthritis    Chest wall mass 11/2016   Hypertension    Lymphadenopathy 11/2016   "extensive"   Non Hodgkin's lymphoma ([place]) 2019   Paralysis ([place])    12-13-2016   Paresthesia of lower extremity 11/2016   bilateral   Pulmonary nodules 11/2016   Renal cancer, right ([place]) dx'd 2019   Right renal mass 11/2016    Surgical History: Past Surgical History:  Procedure Laterality Date   ABDOMINAL HYSTERECTOMY     BREAST LUMPECTOMY     left   COLONOSCOPY N/A 11/02/2013   Procedure: COLONOSCOPY;  Surgeon: Danie Binder, MD;  [facility];  Service: Endoscopy;  Laterality: N/A;  1:15 - moved to 845 - Kim notified pt   COLONOSCOPY N/A 04/17/2018   Procedure: COLONOSCOPY;  Surgeon: Danie Binder, MD;  [facility];  Service: Endoscopy;  Laterality: N/A;  12:00   IR FLUORO GUIDE PORT INSERTION RIGHT  12/30/2016   IR REMOVAL TUN ACCESS W/ PORT W/O FL MOD SED  07/04/2017   IR US GUIDE VASC ACCESS RIGHT  12/30/2016   LAMINECTOMY N/A 12/18/2016   Procedure: DECOMPRESSION T5-8/THORACIC;  Surgeon: Kary Kos, MD;  Location: [facility];  Service: Neurosurgery;  Laterality: N/A;  Decompressive Thoracic Laminectomy, Thoracic five - six, Thoraic six - seven, Thoracic seven - eight   POLYPECTOMY  04/17/2018   Procedure: POLYPECTOMY;  Surgeon: Danie Binder, MD;  [facility];  Service: Endoscopy;;  splenic flexure   ROBOT ASSISTED LAPAROSCOPIC NEPHRECTOMY Right 06/11/2018   Procedure: XI ROBOTIC ASSISTED LAPAROSCOPIC NEPHRECTOMY;  Surgeon: Cleon Gustin, MD;  [facility];  Service: Urology;  Laterality: Right;  2.5 HRS   SHOULDER SURGERY     right    Home Medications:  Allergies as of 03/14/2021       Reactions   Contrast Media [iodinated Contrast Media] Hives, Itching        Medication List        Accurate as of March 14, 2021  9:21 AM. If you have any questions, ask your nurse or doctor.          cholecalciferol 25 MCG (1000 UNIT) tablet Commonly known as: VITAMIN D3 Take 1,000 Units by mouth daily.   multivitamin tablet Take 1 tablet by mouth daily.   VITAMIN B 12 PO Take by mouth.        Allergies:  Allergies  Allergen Reactions   Contrast Media [Iodinated Contrast Media] Hives and Itching    Family History: Family History  Problem Relation Age of Onset   Colon cancer Sister        at age 16   Colon cancer Brother        at age 48   COPD Mother    Diabetes Mellitus II Mother    CAD Mother        Angina   Colon cancer Father    Thyroid disease Sister    Diabetes Mellitus II Sister  Social History:  reports that she quit smoking about 25 years ago. Her smoking use included cigarettes. She has a 15.00 pack-year smoking history. She has never used smokeless tobacco. She reports that she does not drink alcohol and does not use drugs.  ROS: All other review of systems were reviewed and are negative except what is noted above in HPI  Physical Exam: BP 133/73    Pulse 80    Ht 5\' 8"  (1.727 m)    Wt 198 lb (89.8 kg)    BMI 30.11 kg/m   Constitutional:  Alert and oriented, No acute distress. HEENT: [place] AT, moist mucus membranes.  Trachea midline, no masses. Cardiovascular: No clubbing, cyanosis, or edema. Respiratory: Normal respiratory effort, no increased work of breathing. GI: Abdomen is soft, nontender, nondistended, no abdominal masses GU: No CVA tenderness.  Lymph: No cervical or inguinal lymphadenopathy. Skin: No rashes, bruises or suspicious lesions. Neurologic: Grossly  intact, no focal deficits, moving all 4 extremities. Psychiatric: Normal mood and affect.  Laboratory Data: Lab Results  Component Value Date   WBC 6.9 07/07/2020   HGB 12.8 07/07/2020   HCT 39.4 07/07/2020   MCV 85.5 07/07/2020   PLT 263 07/07/2020    Lab Results  Component Value Date   CREATININE 1.30 (H) 03/07/2021    No results found for: PSA  No results found for: TESTOSTERONE  No results found for: HGBA1C  Urinalysis    Component Value Date/Time   COLORURINE YELLOW 12/19/2016 0916   APPEARANCEUR Hazy (A) 08/04/2020 1406   LABSPEC 1.021 12/19/2016 0916   PHURINE 5.0 12/19/2016 0916   GLUCOSEU Negative 08/04/2020 1406   HGBUR NEGATIVE 12/19/2016 0916   BILIRUBINUR Negative 08/04/2020 1406   KETONESUR NEGATIVE 12/19/2016 0916   PROTEINUR Negative 08/04/2020 1406   PROTEINUR NEGATIVE 12/19/2016 0916   NITRITE Negative 08/04/2020 1406   NITRITE NEGATIVE 12/19/2016 0916   LEUKOCYTESUR Trace (A) 08/04/2020 1406    Lab Results  Component Value Date   LABMICR See below: 08/04/2020   WBCUA 0-5 08/04/2020   LABEPIT 0-10 08/04/2020   BACTERIA None seen 08/04/2020    Pertinent Imaging: CXR 12/06/2020: Images reviewed and discussed with the patient  No results found for this or any previous visit.  No results found for this or any previous visit.  No results found for this or any previous visit.  No results found for this or any previous visit.  No results found for this or any previous visit.  No results found for this or any previous visit.  No results found for this or any previous visit.  No results found for this or any previous visit.   Assessment & Plan:    1. Cancer of right kidney ([place]) -RTC 6 months with CXR and CMP - Urinalysis, Routine w reflex microscopic   No follow-ups on file.  Nicolette Bang, MD  [facility] [place]

## 2021-03-21 ENCOUNTER — Encounter (HOSPITAL_COMMUNITY): Payer: Medicare Other | Admitting: Physical Therapy

## 2021-06-12 ENCOUNTER — Other Ambulatory Visit (HOSPITAL_COMMUNITY): Payer: Self-pay | Admitting: Family Medicine

## 2021-06-12 DIAGNOSIS — Z1231 Encounter for screening mammogram for malignant neoplasm of breast: Secondary | ICD-10-CM

## 2021-06-18 ENCOUNTER — Ambulatory Visit (HOSPITAL_COMMUNITY)
Admission: RE | Admit: 2021-06-18 | Discharge: 2021-06-18 | Disposition: A | Discharge status: Home / Self Care | Payer: Medicare Other | Source: Ambulatory Visit | Attending: Family Medicine | Admitting: Family Medicine

## 2021-06-18 DIAGNOSIS — Z1231 Encounter for screening mammogram for malignant neoplasm of breast: Secondary | ICD-10-CM | POA: Insufficient documentation

## 2021-07-09 ENCOUNTER — Encounter: Payer: Self-pay | Admitting: Hematology and Oncology

## 2021-07-09 ENCOUNTER — Other Ambulatory Visit: Payer: Self-pay

## 2021-07-09 ENCOUNTER — Inpatient Hospital Stay: Payer: Medicare Other | Admitting: Hematology and Oncology

## 2021-07-09 ENCOUNTER — Inpatient Hospital Stay: Payer: Medicare Other | Attending: Hematology and Oncology

## 2021-07-09 DIAGNOSIS — C8330 Diffuse large B-cell lymphoma, unspecified site: Secondary | ICD-10-CM

## 2021-07-09 DIAGNOSIS — N183 Chronic kidney disease, stage 3 unspecified: Secondary | ICD-10-CM

## 2021-07-09 DIAGNOSIS — C641 Malignant neoplasm of right kidney, except renal pelvis: Secondary | ICD-10-CM | POA: Insufficient documentation

## 2021-07-09 DIAGNOSIS — Z905 Acquired absence of kidney: Secondary | ICD-10-CM | POA: Insufficient documentation

## 2021-07-09 LAB — COMPREHENSIVE METABOLIC PANEL
ALT: 9 U/L (ref 0–44)
AST: 14 U/L — ABNORMAL LOW (ref 15–41)
Albumin: 4.3 g/dL (ref 3.5–5.0)
Alkaline Phosphatase: 58 U/L (ref 38–126)
Anion gap: 5 (ref 5–15)
BUN: 24 mg/dL — ABNORMAL HIGH (ref 8–23)
CO2: 27 mmol/L (ref 22–32)
Calcium: 9.5 mg/dL (ref 8.9–10.3)
Chloride: 105 mmol/L (ref 98–111)
Creatinine, Ser: 1.56 mg/dL — ABNORMAL HIGH (ref 0.44–1.00)
GFR, Estimated: 36 mL/min — ABNORMAL LOW (ref >60)
Glucose, Bld: 121 mg/dL — ABNORMAL HIGH (ref 70–99)
Potassium: 4.3 mmol/L (ref 3.5–5.1)
Sodium: 137 mmol/L (ref 135–145)
Total Bilirubin: 0.5 mg/dL (ref 0.3–1.2)
Total Protein: 7.7 g/dL (ref 6.5–8.1)

## 2021-07-09 LAB — CBC WITH DIFFERENTIAL/PLATELET
Abs Immature Granulocytes: 0.02 10*3/uL (ref 0.00–0.07)
Basophils Absolute: 0 10*3/uL (ref 0.0–0.1)
Basophils Relative: 1 %
Eosinophils Absolute: 0.1 10*3/uL (ref 0.0–0.5)
Eosinophils Relative: 2 %
HCT: 40.3 % (ref 36.0–46.0)
Hemoglobin: 13.4 g/dL (ref 12.0–15.0)
Immature Granulocytes: 0 %
Lymphocytes Relative: 25 %
Lymphs Abs: 1.6 10*3/uL (ref 0.7–4.0)
MCH: 28.6 pg (ref 26.0–34.0)
MCHC: 33.3 g/dL (ref 30.0–36.0)
MCV: 85.9 fL (ref 80.0–100.0)
Monocytes Absolute: 0.5 10*3/uL (ref 0.1–1.0)
Monocytes Relative: 8 %
Neutro Abs: 4.1 10*3/uL (ref 1.7–7.7)
Neutrophils Relative %: 64 %
Platelets: 243 10*3/uL (ref 150–400)
RBC: 4.69 MIL/uL (ref 3.87–5.11)
RDW: 13.6 % (ref 11.5–15.5)
WBC: 6.4 10*3/uL (ref 4.0–10.5)
nRBC: 0 % (ref 0.0–0.2)

## 2021-07-09 NOTE — Assessment & Plan Note (Signed)
She has follow-up with urologist Clinically, she have no signs or symptoms to suggest cancer recurrence

## 2021-07-09 NOTE — Progress Notes (Signed)
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[facility]) She has no signs or symptoms of cancer recurrence I told the patient there is no benefit for routine surveillance imaging in the absence of signs or symptoms of disease I plan to see her once a year for further follow-up  Cancer of right kidney [facility]) She has follow-up with urologist Clinically, she have no signs or symptoms to suggest cancer recurrence  CKD (chronic kidney disease), stage III ([place]) She has chronic kidney disease stage III due to nephrectomy We discussed the importance of adequate hydration Her blood pressure is now normal since she lost weight  No orders of the defined types were placed in this encounter.   All questions were answered. The patient knows to call the clinic with any problems, questions or concerns. The total time spent in the appointment was 20 minutes encounter with patients including review of chart and various tests results, discussions about plan of care and coordination of care plan   Heath Lark, MD 07/09/2021 11:42 AM  INTERVAL HISTORY: Please see below for problem oriented charting. she returns for surveillance follow-up for history of lymphoma and kidney cancer She is doing well No recent infection No new lymphadenopathy The patient has lost approximately 10 pounds since last time I saw her, through lifestyle changes  REVIEW OF SYSTEMS:   Constitutional: Denies fevers, chills or abnormal weight loss Eyes: Denies blurriness of vision Ears, nose, mouth, throat, and face: Denies mucositis or sore throat Respiratory: Denies cough, dyspnea or wheezes Cardiovascular: Denies palpitation, chest discomfort or lower extremity  swelling Gastrointestinal:  Denies nausea, heartburn or change in bowel habits Skin: Denies abnormal skin rashes Lymphatics: Denies new lymphadenopathy or easy bruising Neurological:Denies numbness, tingling or new weaknesses Behavioral/Psych: Mood is stable, no new changes  All other systems were reviewed with the patient and are negative.  I have reviewed the past medical history, past surgical history, social history and family history with the patient and they are unchanged from previous note.  ALLERGIES:  is allergic to contrast media [iodinated contrast media].  MEDICATIONS:  Current Outpatient Medications  Medication Sig Dispense Refill   cholecalciferol (VITAMIN D3) 25 MCG (1000 UNIT) tablet Take 1,000 Units by mouth daily.     Cyanocobalamin (VITAMIN B 12 PO) Take by mouth.     Multiple Vitamin (MULTIVITAMIN) tablet Take 1 tablet by mouth daily.     No current facility-administered medications for this visit.    SUMMARY OF ONCOLOGIC HISTORY: Oncology History  Diffuse large B cell lymphoma ([place])  12/12/2016 Imaging   CT scan showed  1. Extensive mesenteric, retroperitoneal, left hilar, mediastinal, left supraclavicular and left axillary adenopathy. Differential considerations include metastatic adenopathy, lymphoma and leukemia. The size of the nodes in the mesentery and retroperitoneum are suggestive of non-Hodgkin's lymphoma. 2. 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 Differential considerations include metastatic disease and lymphoma. A left upper lobe primary lung carcinoma is a possibility. 3. 3.2 cm solid right renal mass. Differential considerations include renal cell carcinoma and oncocytoma. 4. Small amount of free peritoneal fluid, most likely due to lymphatic obstruction by the mesenteric adenopathy. 5. Left fourth rib fracture. 6. Colonic diverticulosis. 7. Calcific coronary artery and aortic  atherosclerosis. Aortic Atherosclerosis (ICD10-I70.0).      12/12/2016 - 12/14/2016 Hospital Admission   He was briefly admitted and evaluated for weakness.  CT imaging showed diffuse disease suspicious for lymphoma and outpatient evaluation was arranged     12/18/2016 Imaging   MR thoracic spine Very limited examination demonstrating compression of the cord from approximately mid T6 to T7-8 by epidural tumor eccentric to the right and surrounding the cord both anteriorly and posteriorly. Tumor fills the right neural foramina at T6-7 and T7-8 and completely replaces the T7 vertebral body.    12/18/2016 Surgery   She underwent procedure: Decompressive thoracic laminectomy from T5-T8 for resection of epidural tumor     12/18/2016 Pathology Results   1. Soft tissue mass, simple excision, thoracic dorsal epidural mass - DIFFUSE LARGE B-CELL LYMPHOMA. - SEE ONCOLOGY TABLE. 2. Soft tissue mass, simple excision, thoracic dorsal epidural mass - DIFFUSE LARGE B-CELL LYMPHOMA. Microscopic Comment 1. LYMPHOMA Histologic type: Non-Hodgkin B-cell lymphoma: Diffuse large B-cell lymphoma. Grade (if applicable): High grade. Flow cytometry: N/A. Immunohistochemical stains: CD20. CD3, CD5, CD10, CD23, CD30, CD15, PAX-5, Ki-67, bcl-2, bcl-6, CD34. Touch preps/imprints: N/A. Comments: There is a diffuse infiltrate of atypical lymphocytes. The lymphocytes are medium to large in size with irregular nuclear contours. There are background smaller lymphocytes. While there are admixed smaller cells, there are diffuse areas of large cells. The large lymphocytes are positive for CD20, CD10, bcl-6, and bcl-2. CD3 and CD5 highlight admixed T-cells. CD34, CD30, CD23, and CD15 are negative. PAX-5 is positive. Ki-67 is variable with areas up to 70%. 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 The background of smaller cells with germinal center phenotype may  suggest this arose from a follicular lymphoma.     12/18/2016 - 01/17/2017 Hospital Admission   The patient was readmitted to the hospital due to complete weakness secondary to spinal cord compression.  She underwent surgery and pathology report confirmed diagnosis of diffuse large B-cell lymphoma. She received IT chemo on 01/16/17    01/16/2017 Pathology Results   CEREBROSPINAL FLUID (SPECIMEN 1 OF 1 COLLECTED 01/16/17): NO MALIGNANT CELLS IDENTIFIED.    02/03/2017 - 02/07/2017 Hospital Admission   She is admitted for cycle 2 of chemo    02/24/2017 - 02/28/2017 Hospital Admission   She is admitted for cycle 3 of chemotherapy    03/13/2017 PET scan   1. Low level FDG uptake associated with previous areas of soft tissue tumor within the chest and abdomen. Deauville criteria 2 and 3 compatible with treated tumor. No sites of residual metabolically active tumor (Deauville criteria 4 or 5) identified. 2. Mild diffuse uptake throughout the bone marrow is noted which is nonspecific in may reflect treatment related changes. 3. Stable size of previously noted complex lesion arising from the posterior cortex of the right kidney which now measures fluid attenuation. This is incompletely characterized on today's given lack of IV contrast material. 4. Aortic Atherosclerosis (ICD10-I70.0). Multi vessel coronary artery calcifications noted.    03/13/2017 Imaging   Study Conclusions  - Procedure narrative: Transthoracic echocardiography. Image quality was suboptimal. The study was technically difficult, as a result of poor acoustic windows, poor sound wave transmission, chest wall deformity, and body habitus. - Left ventricle: GLLS is normal at -19% The cavity size was normal. There was mild focal basal hypertrophy of the septum. Systolic function was vigorous. The estimated ejection fraction was in the range of 65% to 70%. Wall motion was normal;  there were no regional wall motion abnormalities. There  was an increased relative contribution of atrial contraction to ventricular filling. Doppler parameters are consistent with abnormal left ventricular relaxation (grade 1 diastolic dysfunction). - Pulmonary arteries: Systolic pressure could not be accurately estimated.    03/24/2017 - 03/28/2017 Hospital Admission   The patient was admitted to the hospital for cycle 4 of chemotherapy    04/21/2017 - 04/25/2017 Hospital Admission   She is admitted for cycle 5 of R-EPOCH    05/19/2017 - 05/23/2017 Hospital Admission   She is admitted for cycle 6 of R-EPOCH    06/20/2017 Imaging   1. No findings for residual or recurrent lymphoma involving the chest. The left fifth rib is healed and the surrounding extensive soft tissue density has completely resolved. 2. Interval regression of the vague mesenteric and retroperitoneal soft tissue densities and hazy interstitial soft tissue thickening around the mesenteric vessels. No new or enlarging lymph nodes to  suggest recurrent lymphoma. 3. 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 4. Decompressive laminectomies in the midthoracic spine. No findings for recurrent spinal tumor.    07/04/2017 Procedure   Removal of implanted Port-A-Cath utilizing sharp and blunt dissection. The procedure was uncomplicated.    12/25/2017 Imaging   IMPRESSION: Chest Impression:  1. No evidence of lymphoma recurrence in thorax.  Abdomen / Pelvis Impression:  1. No evidence of lymphoma recurrence in the abdomen pelvis. 2. Stable hazy peritoneal fat surrounding the mesenteric vessels. 3. Stable ill-defined nodular density in the LEFT retroperitoneum adjacent to the aorta. 4. Interval increase in size of RIGHT renal neoplasm    07/03/2018 Imaging   No findings suspicious for active lymphoma in the chest, abdomen, or pelvis. Spleen is normal in size.    Stable vague hazy soft tissue in the retroperitoneum/mesentery, possibly  reflecting treated lymphoma.   Status post right nephrectomy. No evidence of recurrent or metastatic disease.   Mild ground-glass opacity in the medial right lower lobe, nonspecific, possibly reflecting mild infection.      01/04/2019 Imaging   1. No evidence of recurrent lymphoma. 2. Aortic atherosclerosis (ICD10-170.0). Coronary artery calcification. 3. Enlarged pulmonic trunk, indicative of pulmonary arterial hypertension   01/07/2020 Imaging   1. Stable CT of the chest, abdomen and pelvis. No specific findings identified to suggest residual or recurrent tumor or metastatic disease. 2. Coronary artery calcifications noted. 3. Status post right nephrectomy. 4. Similar appearance of hazy soft tissue stranding within the small bowel mesentery likely reflecting treated lymphoma. 5. Aortic atherosclerosis.   Cancer of right kidney [facility]  06/11/2018 Surgery   Procedure: 1.  right robot assisted laparoscopic radical nephrectomy   Attending: Nicolette Bang    Specimens: right radical nephrectomy    Findings: 1 renal artery and 1 renal vein. 2cm liver laceration requiring surgicel and surgiflo. The assistant was utilized for retraction, suction, passing instruments, and deploying the stapler cross the renal vein.      06/11/2018 Surgery   Kidney, radical nephrectomy for tumor, right RENAL CELL CARCINOMA, CONVENTIONAL CLEAR CELL TYPE, NUCLEAR GRADE 2 (SIZE 3.6 CM) TUMOR IS LIMITED TO THE KIDNEY (PT1A) LYMPHOVASCULAR INVASION IS IDENTIFIED URETERAL, VASCULAR AND ALL RESECTION MARGINS ARE NEGATIVE FOR TUMOR Microscopic Comment KIDNEY: Procedure: Radical Nephrectomy Specimen Laterality: Right Tumor Size: 3.6 cm Tumor Focality: Focal Histologic Type: Clear cell RCC Sarcomatoid Features: Negative Rhabdoid Features: Negative Histologic Grade: 2 Tumor Necrosis: Negative Tumor Extension: Negative Margins: Negative Regional Lymph Nodes: X No  lymph nodes submitted or found Number  of Lymph Nodes Involved: NA Number of Lymph Nodes Examined: NA Pathologic Stage Classification (pTNM, AJCC 8th Edition): pT 1a, pNx Pathologic Findings in Nonneoplastic Kidney: Chronic interstitial inflammation    06/26/2018 Cancer Staging   Staging form: Kidney, AJCC 8th Edition - Pathologic: Stage I (pT1a, pN0, cM0) - Signed by Heath Lark, MD on 06/26/2018      PHYSICAL EXAMINATION: ECOG PERFORMANCE STATUS: 0 - Asymptomatic  Vitals:   07/09/21 1040  BP: 122/82  Pulse: 79  Resp: 18  Temp: 98.3 F (36.8 C)  SpO2: 97%   Filed Weights   07/09/21 1040  Weight: 188 lb 12.8 oz (85.6 kg)    GENERAL:alert, no distress and comfortable SKIN: skin color, texture, turgor are normal, no rashes or significant lesions EYES: normal, Conjunctiva are pink and non-injected, sclera clear OROPHARYNX:no exudate, no erythema and lips, buccal mucosa, and tongue normal  NECK: supple, thyroid normal size, non-tender, without nodularity LYMPH:  no palpable lymphadenopathy in the cervical, axillary or inguinal LUNGS: clear to auscultation and percussion with normal breathing effort HEART: regular rate & rhythm and no murmurs and no lower extremity edema ABDOMEN:abdomen soft, non-tender and normal bowel sounds Musculoskeletal:no cyanosis of digits and no clubbing  NEURO: alert & oriented x 3 with fluent speech, no focal motor/sensory deficits  LABORATORY DATA:  I have reviewed the data as listed    Component Value Date/Time   NA 137 07/09/2021 1026   NA 141 03/07/2021 1159   NA 134 (L) 02/19/2017 0925   K 4.3 07/09/2021 1026   K 3.0 (LL) 02/19/2017 0925   CL 105 07/09/2021 1026   CO2 27 07/09/2021 1026   CO2 29 02/19/2017 0925   GLUCOSE 121 (H) 07/09/2021 1026   GLUCOSE 135 02/19/2017 0925   BUN 24 (H) 07/09/2021 1026   BUN 24 03/07/2021 1159   BUN 7.4 02/19/2017 0925   CREATININE 1.56 (H) 07/09/2021 1026   CREATININE 0.9 02/19/2017 0925   CALCIUM 9.5 07/09/2021 1026   CALCIUM 8.9  02/19/2017 0925   PROT 7.7 07/09/2021 1026   PROT 7.3 03/07/2021 1159   PROT 6.4 02/19/2017 0925   ALBUMIN 4.3 07/09/2021 1026   ALBUMIN 4.5 03/07/2021 1159   ALBUMIN 3.4 (L) 02/19/2017 0925   AST 14 (L) 07/09/2021 1026   AST 16 02/19/2017 0925   ALT 9 07/09/2021 1026   ALT 13 02/19/2017 0925   ALKPHOS 58 07/09/2021 1026   ALKPHOS 86 02/19/2017 0925   BILITOT 0.5 07/09/2021 1026   BILITOT 0.4 03/07/2021 1159   BILITOT 0.65 02/19/2017 0925   GFRNONAA 36 (L) 07/09/2021 1026   GFRAA 35 (L) 07/06/2019 1052    No results found for: SPEP, UPEP  Lab Results  Component Value Date   WBC 6.4 07/09/2021   NEUTROABS 4.1 07/09/2021   HGB 13.4 07/09/2021   HCT 40.3 07/09/2021   MCV 85.9 07/09/2021   PLT 243 07/09/2021      Chemistry      Component Value Date/Time   NA 137 07/09/2021 1026   NA 141 03/07/2021 1159   NA 134 (L) 02/19/2017 0925   K 4.3 07/09/2021 1026   K 3.0 (LL) 02/19/2017 0925   CL 105 07/09/2021 1026   CO2 27 07/09/2021 1026   CO2 29 02/19/2017 0925   BUN 24 (H) 07/09/2021 1026   BUN 24 03/07/2021 1159   BUN 7.4 02/19/2017 0925   CREATININE 1.56 (H) 07/09/2021 1026   CREATININE 0.9  02/19/2017 0925      Component Value Date/Time   CALCIUM 9.5 07/09/2021 1026   CALCIUM 8.9 02/19/2017 0925   ALKPHOS 58 07/09/2021 1026   ALKPHOS 86 02/19/2017 0925   AST 14 (L) 07/09/2021 1026   AST 16 02/19/2017 0925   ALT 9 07/09/2021 1026   ALT 13 02/19/2017 0925   BILITOT 0.5 07/09/2021 1026   BILITOT 0.4 03/07/2021 1159   BILITOT 0.65 02/19/2017 0925       RADIOGRAPHIC STUDIES: I have personally reviewed the radiological images as listed and agreed with the findings in the report. MM 3D SCREEN BREAST BILATERAL  Result Date: 06/18/2021 CLINICAL DATA:  Screening. EXAM: DIGITAL SCREENING BILATERAL MAMMOGRAM WITH TOMOSYNTHESIS AND CAD TECHNIQUE: Bilateral screening digital craniocaudal and mediolateral oblique mammograms were obtained. Bilateral screening digital  breast tomosynthesis was performed. The images were evaluated with computer-aided detection. COMPARISON:  Previous exam(s). ACR Breast Density Category b: There are scattered areas of fibroglandular density. FINDINGS: There are no findings suspicious for malignancy. IMPRESSION: No mammographic evidence of malignancy. A result letter of this screening mammogram will be mailed directly to the patient. RECOMMENDATION: Screening mammogram in one year. (Code:[id number]) BI-RADS CATEGORY  1: Negative. Electronically Signed   By: Marin Olp M.D.   On: 06/18/2021 12:25

## 2021-07-09 NOTE — Assessment & Plan Note (Signed)
She has chronic kidney disease stage III due to nephrectomy We discussed the importance of adequate hydration Her blood pressure is now normal since she lost weight

## 2021-07-09 NOTE — Assessment & Plan Note (Signed)
She has no signs or symptoms of cancer recurrence I told the patient there is no benefit for routine surveillance imaging in the absence of signs or symptoms of disease I plan to see her once a year for further follow-up

## 2021-09-05 ENCOUNTER — Other Ambulatory Visit: Payer: Medicare Other

## 2021-09-05 DIAGNOSIS — C641 Malignant neoplasm of right kidney, except renal pelvis: Secondary | ICD-10-CM

## 2021-09-06 LAB — COMPREHENSIVE METABOLIC PANEL
ALT: 8 IU/L (ref 0–32)
AST: 14 IU/L (ref 0–40)
Albumin/Globulin Ratio: 1.5 (ref 1.2–2.2)
Albumin: 4.4 g/dL (ref 3.9–4.9)
Alkaline Phosphatase: 72 IU/L (ref 44–121)
BUN/Creatinine Ratio: 17 (ref 12–28)
BUN: 25 mg/dL (ref 8–27)
Bilirubin Total: 0.3 mg/dL (ref 0.0–1.2)
CO2: 23 mmol/L (ref 20–29)
Calcium: 9.8 mg/dL (ref 8.7–10.3)
Chloride: 106 mmol/L (ref 96–106)
Creatinine, Ser: 1.44 mg/dL — ABNORMAL HIGH (ref 0.57–1.00)
Globulin, Total: 2.9 g/dL (ref 1.5–4.5)
Glucose: 101 mg/dL — ABNORMAL HIGH (ref 70–99)
Potassium: 4.9 mmol/L (ref 3.5–5.2)
Sodium: 143 mmol/L (ref 134–144)
Total Protein: 7.3 g/dL (ref 6.0–8.5)
eGFR: 39 mL/min/{1.73_m2} — ABNORMAL LOW (ref >59)

## 2021-09-12 ENCOUNTER — Ambulatory Visit: Payer: Medicare Other | Admitting: Urology

## 2021-09-12 ENCOUNTER — Ambulatory Visit (INDEPENDENT_AMBULATORY_CARE_PROVIDER_SITE_OTHER): Payer: Medicare Other | Admitting: Urology

## 2021-09-12 DIAGNOSIS — C641 Malignant neoplasm of right kidney, except renal pelvis: Secondary | ICD-10-CM

## 2021-09-12 LAB — URINALYSIS, ROUTINE W REFLEX MICROSCOPIC
Bilirubin, UA: NEGATIVE
Glucose, UA: NEGATIVE
Ketones, UA: NEGATIVE
Leukocytes,UA: NEGATIVE
Nitrite, UA: NEGATIVE
Protein,UA: NEGATIVE
RBC, UA: NEGATIVE
Specific Gravity, UA: <1.005 — ABNORMAL LOW (ref 1.005–1.030)
Urobilinogen, Ur: 0.2 mg/dL (ref 0.2–1.0)
pH, UA: 5.5 (ref 5.0–7.5)

## 2021-09-13 ENCOUNTER — Ambulatory Visit (HOSPITAL_COMMUNITY)
Admission: RE | Admit: 2021-09-13 | Discharge: 2021-09-13 | Disposition: A | Discharge status: Home / Self Care | Payer: Medicare Other | Source: Ambulatory Visit | Attending: Urology | Admitting: Urology

## 2021-09-13 DIAGNOSIS — C641 Malignant neoplasm of right kidney, except renal pelvis: Secondary | ICD-10-CM | POA: Diagnosis present

## 2021-09-14 ENCOUNTER — Encounter: Payer: Self-pay | Admitting: Urology

## 2021-09-14 ENCOUNTER — Ambulatory Visit (INDEPENDENT_AMBULATORY_CARE_PROVIDER_SITE_OTHER): Payer: Medicare Other | Admitting: Urology

## 2021-09-14 VITALS — BP 128/79 | HR 73

## 2021-09-14 DIAGNOSIS — Z85528 Personal history of other malignant neoplasm of kidney: Secondary | ICD-10-CM | POA: Diagnosis not present

## 2021-09-14 DIAGNOSIS — C641 Malignant neoplasm of right kidney, except renal pelvis: Secondary | ICD-10-CM

## 2021-09-14 NOTE — Patient Instructions (Signed)
Kidney Cancer  Kidney cancer is an abnormal growth of cells in one or both kidneys. The kidneys filter waste from your blood and produce urine. Kidney cancer may spread to other parts of your body. This type of cancer may also be called renal cell carcinoma. What are the causes? The cause of this condition is not always known. In some cases, abnormal changes to genes (genetic mutations) can cause cells to form cancer. What increases the risk? You may be more likely to develop kidney cancer if you: Are over age 22. The risk increases with age. Have a family history of kidney cancer. Are of African-American, Native American, or Native [place] descent. Smoke. Are female. Are obese. Have high blood pressure (hypertension). Have advanced kidney disease, especially if you need long-term dialysis. Have certain conditions that are passed from parent to child (inherited), such as von Hippel-Lindau disease, tuberous sclerosis, or hereditary papillary renal carcinoma. Have been exposed to certain chemicals. What are the signs or symptoms? In the early stages, kidney cancer does not cause symptoms. As the cancer grows, symptoms may include: Blood in the urine. Pain in the upper back or abdomen, just below the rib cage. You may feel pain on one or both sides of the body. Fatigue. Unexplained weight loss. Fever. How is this diagnosed? This condition may be diagnosed based on: Your symptoms and medical history. A physical exam. Blood and urine tests. X-rays. Imaging tests, such as CT scans, MRIs, and PET scans. Having dye injected into your blood through an IV, and then having X-rays taken of: Your kidneys and the rest of the organs involved in making and storing urine (intravenous pyelogram). Your blood vessels (angiogram). Removal and testing of a kidney tissue sample (biopsy). Your cancer will be assessed (staged), based on how severe it is and how much it has spread. How is this  treated? Treatment depends on the type and stage of the cancer. Treatment may include one or more of the following: Surgery. This may include surgery to remove: Just the tumor (nephron-sparing surgery). The entire kidney (nephrectomy). The kidney, some of the surrounding healthy tissue, nearby lymph nodes, and the adrenal gland in certain cases (radical nephrectomy). Medicines that kill cancer cells (chemotherapy). High-energy rays that kill cancer cells (radiation therapy). Targeted therapy. This targets specific parts of cancer cells and the area around them to block the growth and the spread of the cancer. Targeted therapy can help to limit the damage to healthy cells. Medicines that help your body's disease-fighting system (immune system) fight cancer cells (immunotherapy). Freezing cancer cells using gas or liquid that is delivered through a needle (cryoablation). Destroying cancer cells using high-energy radio waves that are delivered through a needle-like probe (radiofrequency ablation). A procedure to block the artery that supplies blood to the tumor, which kills the cancer cells (embolization). Follow these instructions at home: Eating and drinking Some of your treatments might affect your appetite and your ability to chew and swallow. If you are having problems eating, or if you do not have an appetite, meet with a diet and nutrition specialist (dietitian). If you have side effects that affect eating, it may help to: Eat smaller meals and snacks often. Drink high-nutrition and high-calorie shakes or supplements. Eat bland and soft foods that are easy to eat. Not eat foods that are hot, spicy, or hard to swallow. Lifestyle Do not drink alcohol. Do not use any products that contain nicotine or tobacco, such as cigarettes and e-cigarettes. If you need  help quitting, ask your health care provider. General instructions  Take over-the-counter and prescription medicines only as told by  your health care provider. This includes vitamins, supplements, and herbal products. Consider joining a support group to help you cope with the stress of having kidney cancer. Work with your health care provider to manage any side effects of treatment. Keep all follow-up visits as told by your health care provider. This is important. Where to find more information American Cancer Society: https://www.cancer.[place] ([place]): https://www.cancer.gov Contact a health care provider if you: Notice that you bruise or bleed easily. Are losing weight without trying. Have new or increased fatigue or weakness. Get help right away if you have: Blood in your urine. A sudden increase in pain. A fever. Shortness of breath. Chest pain. Yellow skin or whites of your eyes (jaundice). Summary Kidney cancer is an abnormal growth of cells (tumor) in one or both kidneys. Tumors may spread to other parts of your body. In the early stages, kidney cancer does not cause symptoms. As the cancer grows, symptoms may include blood in the urine, pain in the upper back or abdomen, unexplained weight loss, fatigue, and fever. Treatment depends on the type and stage of the cancer. It may include surgery to remove the tumor, procedures and medicines to kill the cancer cells, or medicines to help your body fight cancer cells. This information is not intended to replace advice given to you by your health care provider. Make sure you discuss any questions you have with your health care provider. Document Revised: 09/26/2020 Document Reviewed: 09/26/2020 Elsevier Patient Education  [place].

## 2021-09-14 NOTE — Progress Notes (Unsigned)
09/14/2021 10:33 AM   Katrina [name] [birth year] [id number]  Referring provider: Lemmie Evens, MD [place],  [place] [address]  Followup right RCC   HPI: Katrina [name] is a [phone] here for followup for right RCC. Creatinine 1.44. CXR shows no evidence of metastatic disease. She drinks 60oz of water daily. No significant LUTS.    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   Paresthesia of lower extremity 11/2016   bilateral   Pulmonary nodules 11/2016   Renal cancer, right ([place]) dx'd 2019   Right renal mass 11/2016    Surgical History: Past Surgical History:  Procedure Laterality Date   ABDOMINAL HYSTERECTOMY     BREAST LUMPECTOMY     left   COLONOSCOPY N/A 11/02/2013   Procedure: COLONOSCOPY;  Surgeon: Danie Binder, MD;  [facility];  Service: Endoscopy;  Laterality: N/A;  1:15 - moved to 845 - Kim notified pt   COLONOSCOPY N/A 04/17/2018   Procedure: COLONOSCOPY;  Surgeon: Danie Binder, MD;  [facility];  Service: Endoscopy;  Laterality: N/A;  12:00   IR FLUORO GUIDE PORT INSERTION RIGHT  12/30/2016   IR REMOVAL TUN ACCESS W/ PORT W/O FL MOD SED  07/04/2017   IR US GUIDE VASC ACCESS RIGHT  12/30/2016   LAMINECTOMY N/A 12/18/2016   Procedure: DECOMPRESSION T5-8/THORACIC;  Surgeon: Kary Kos, MD;  Location: [facility];  Service: Neurosurgery;  Laterality: N/A;  Decompressive Thoracic Laminectomy, Thoracic five - six, Thoraic six - seven, Thoracic seven - eight   POLYPECTOMY  04/17/2018   Procedure: POLYPECTOMY;  Surgeon: Danie Binder, MD;  [facility];  Service: Endoscopy;;  splenic flexure   ROBOT ASSISTED LAPAROSCOPIC NEPHRECTOMY Right 06/11/2018   Procedure: XI ROBOTIC ASSISTED LAPAROSCOPIC NEPHRECTOMY;  Surgeon: Cleon Gustin, MD;   [facility];  Service: Urology;  Laterality: Right;  2.5 HRS   SHOULDER SURGERY     right    Home Medications:  Allergies as of 09/14/2021       Reactions   Contrast Media [iodinated Contrast Media] Hives, Itching        Medication List        Accurate as of September 14, 2021 10:33 AM. If you have any questions, ask your nurse or doctor.          cholecalciferol 25 MCG (1000 UNIT) tablet Commonly known as: VITAMIN D3 Take 1,000 Units by mouth daily.   multivitamin tablet Take 1 tablet by mouth daily.   VITAMIN B 12 PO Take by mouth.        Allergies:  Allergies  Allergen Reactions   Contrast Media [Iodinated Contrast Media] Hives and Itching    Family History: Family History  Problem Relation Age of Onset   Colon cancer Sister        at age 57   Colon cancer Brother        at age 78   COPD Mother    Diabetes Mellitus II Mother    CAD Mother        Angina   Colon cancer Father    Thyroid disease Sister    Diabetes Mellitus II Sister     Social History:  reports that she  quit smoking about 25 years ago. Her smoking use included cigarettes. She has a 15.00 pack-year smoking history. She has never used smokeless tobacco. She reports that she does not drink alcohol and does not use drugs.  ROS: All other review of systems were reviewed and are negative except what is noted above in HPI  Physical Exam: BP 128/79   Pulse 73   Constitutional:  Alert and oriented, No acute distress. HEENT: [place] AT, moist mucus membranes.  Trachea midline, no masses. Cardiovascular: No clubbing, cyanosis, or edema. Respiratory: Normal respiratory effort, no increased work of breathing. GI: Abdomen is soft, nontender, nondistended, no abdominal masses GU: No CVA tenderness.  Lymph: No cervical or inguinal lymphadenopathy. Skin: No rashes, bruises or suspicious lesions. Neurologic: Grossly intact, no focal deficits, moving all 4 extremities. Psychiatric: Normal mood and  affect.  Laboratory Data: Lab Results  Component Value Date   WBC 6.4 07/09/2021   HGB 13.4 07/09/2021   HCT 40.3 07/09/2021   MCV 85.9 07/09/2021   PLT 243 07/09/2021    Lab Results  Component Value Date   CREATININE 1.44 (H) 09/05/2021    No results found for: "PSA"  No results found for: "TESTOSTERONE"  No results found for: "HGBA1C"  Urinalysis    Component Value Date/Time   COLORURINE YELLOW 12/19/2016 0916   APPEARANCEUR Clear 09/12/2021 1109   LABSPEC 1.021 12/19/2016 0916   PHURINE 5.0 12/19/2016 0916   GLUCOSEU Negative 09/12/2021 1109   HGBUR NEGATIVE 12/19/2016 0916   BILIRUBINUR Negative 09/12/2021 1109   KETONESUR NEGATIVE 12/19/2016 0916   PROTEINUR Negative 09/12/2021 1109   PROTEINUR NEGATIVE 12/19/2016 0916   NITRITE Negative 09/12/2021 1109   NITRITE NEGATIVE 12/19/2016 0916   LEUKOCYTESUR Negative 09/12/2021 1109    Lab Results  Component Value Date   LABMICR Comment 09/12/2021   WBCUA 0-5 03/14/2021   LABEPIT 0-10 03/14/2021   BACTERIA None seen 03/14/2021    Pertinent Imaging: CXR yesterday: Images reviewed and discussed with the patient  No results found for this or any previous visit.  No results found for this or any previous visit.  No results found for this or any previous visit.  No results found for this or any previous visit.  No results found for this or any previous visit.  No results found for this or any previous visit.  No results found for this or any previous visit.  No results found for this or any previous visit.   Assessment & Plan:    1. Cancer of right kidney ([place]) RTC 6 months with CXR and CMP   No follow-ups on file.  Nicolette Bang, MD  [facility] [place]

## 2021-09-20 NOTE — Progress Notes (Signed)
Patient rescheduled

## 2022-03-08 ENCOUNTER — Ambulatory Visit (HOSPITAL_COMMUNITY)
Admission: RE | Admit: 2022-03-08 | Discharge: 2022-03-08 | Disposition: A | Discharge status: Home / Self Care | Payer: Medicare Other | Source: Ambulatory Visit | Attending: Urology | Admitting: Urology

## 2022-03-08 ENCOUNTER — Other Ambulatory Visit: Payer: Medicare Other

## 2022-03-08 ENCOUNTER — Other Ambulatory Visit: Payer: Self-pay

## 2022-03-08 DIAGNOSIS — C641 Malignant neoplasm of right kidney, except renal pelvis: Secondary | ICD-10-CM

## 2022-03-09 LAB — COMPREHENSIVE METABOLIC PANEL
ALT: 9 IU/L (ref 0–32)
AST: 15 IU/L (ref 0–40)
Albumin/Globulin Ratio: 1.8 (ref 1.2–2.2)
Albumin: 4.4 g/dL (ref 3.9–4.9)
Alkaline Phosphatase: 67 IU/L (ref 44–121)
BUN/Creatinine Ratio: 14 (ref 12–28)
BUN: 20 mg/dL (ref 8–27)
Bilirubin Total: 0.4 mg/dL (ref 0.0–1.2)
CO2: 21 mmol/L (ref 20–29)
Calcium: 9.5 mg/dL (ref 8.7–10.3)
Chloride: 102 mmol/L (ref 96–106)
Creatinine, Ser: 1.38 mg/dL — ABNORMAL HIGH (ref 0.57–1.00)
Globulin, Total: 2.5 g/dL (ref 1.5–4.5)
Glucose: 97 mg/dL (ref 70–99)
Potassium: 4.4 mmol/L (ref 3.5–5.2)
Sodium: 139 mmol/L (ref 134–144)
Total Protein: 6.9 g/dL (ref 6.0–8.5)
eGFR: 41 mL/min/{1.73_m2} — ABNORMAL LOW (ref >59)

## 2022-03-15 ENCOUNTER — Ambulatory Visit (INDEPENDENT_AMBULATORY_CARE_PROVIDER_SITE_OTHER): Payer: Medicare Other | Admitting: Urology

## 2022-03-15 VITALS — BP 117/75 | HR 98

## 2022-03-15 DIAGNOSIS — C641 Malignant neoplasm of right kidney, except renal pelvis: Secondary | ICD-10-CM

## 2022-03-15 LAB — MICROSCOPIC EXAMINATION
Bacteria, UA: NONE SEEN
Epithelial Cells (non renal): >10 /hpf — AB (ref 0–10)

## 2022-03-15 LAB — URINALYSIS, ROUTINE W REFLEX MICROSCOPIC
Bilirubin, UA: NEGATIVE
Glucose, UA: NEGATIVE
Leukocytes,UA: NEGATIVE
Nitrite, UA: NEGATIVE
Specific Gravity, UA: 1.02 (ref 1.005–1.030)
Urobilinogen, Ur: 1 mg/dL (ref 0.2–1.0)
pH, UA: 5.5 (ref 5.0–7.5)

## 2022-03-15 NOTE — Progress Notes (Unsigned)
03/15/2022 9:42 AM   Janee Morn 03-29-1950 [id number]  Referring provider: Lemmie Evens, MD [place],  [place] [address]  No chief complaint on file.   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   Paresthesia of lower extremity 11/2016   bilateral   Pulmonary nodules 11/2016   Renal cancer, right ([place]) dx'd 2019   Right renal mass 11/2016    Surgical History: Past Surgical History:  Procedure Laterality Date   ABDOMINAL HYSTERECTOMY     BREAST LUMPECTOMY     left   COLONOSCOPY N/A 11/02/2013   Procedure: COLONOSCOPY;  Surgeon: Danie Binder, MD;  [facility];  Service: Endoscopy;  Laterality: N/A;  1:15 - moved to 845 - Kim notified pt   COLONOSCOPY N/A 04/17/2018   Procedure: COLONOSCOPY;  Surgeon: Danie Binder, MD;  [facility];  Service: Endoscopy;  Laterality: N/A;  12:00   IR FLUORO GUIDE PORT INSERTION RIGHT  12/30/2016   IR REMOVAL TUN ACCESS W/ PORT W/O FL MOD SED  07/04/2017   IR US GUIDE VASC ACCESS RIGHT  12/30/2016   LAMINECTOMY N/A 12/18/2016   Procedure: DECOMPRESSION T5-8/THORACIC;  Surgeon: Kary Kos, MD;  Location: [facility];  Service: Neurosurgery;  Laterality: N/A;  Decompressive Thoracic Laminectomy, Thoracic five - six, Thoraic six - seven, Thoracic seven - eight   POLYPECTOMY  04/17/2018   Procedure: POLYPECTOMY;  Surgeon: Danie Binder, MD;  [facility];  Service: Endoscopy;;  splenic flexure   ROBOT ASSISTED LAPAROSCOPIC NEPHRECTOMY Right 06/11/2018   Procedure: XI ROBOTIC ASSISTED LAPAROSCOPIC NEPHRECTOMY;  Surgeon: Cleon Gustin, MD;  [facility];  Service: Urology;  Laterality: Right;  2.5 HRS   SHOULDER SURGERY     right    Home Medications:  Allergies as of 03/15/2022       Reactions    Contrast Media [iodinated Contrast Media] Hives, Itching        Medication List        Accurate as of March 15, 2022  9:42 AM. If you have any questions, ask your nurse or doctor.          cholecalciferol 25 MCG (1000 UNIT) tablet Commonly known as: VITAMIN D3 Take 1,000 Units by mouth daily.   multivitamin tablet Take 1 tablet by mouth daily.   VITAMIN B 12 PO Take by mouth.        Allergies:  Allergies  Allergen Reactions   Contrast Media [Iodinated Contrast Media] Hives and Itching    Family History: Family History  Problem Relation Age of Onset   Colon cancer Sister        at age 33   Colon cancer Brother        at age 29   COPD Mother    Diabetes Mellitus II Mother    CAD Mother        Angina   Colon cancer Father    Thyroid disease Sister    Diabetes Mellitus II Sister     Social History:  reports that she quit smoking about 26 years ago. Her smoking use included cigarettes. She has a 15.00 pack-year smoking history. She has never used smokeless tobacco. She reports  that she does not drink alcohol and does not use drugs.  ROS: All other review of systems were reviewed and are negative except what is noted above in HPI  Physical Exam: BP 117/75   Pulse 98   Constitutional:  Alert and oriented, No acute distress. HEENT: [place] AT, moist mucus membranes.  Trachea midline, no masses. Cardiovascular: No clubbing, cyanosis, or edema. Respiratory: Normal respiratory effort, no increased work of breathing. GI: Abdomen is soft, nontender, nondistended, no abdominal masses GU: No CVA tenderness.  Lymph: No cervical or inguinal lymphadenopathy. Skin: No rashes, bruises or suspicious lesions. Neurologic: Grossly intact, no focal deficits, moving all 4 extremities. Psychiatric: Normal mood and affect.  Laboratory Data: Lab Results  Component Value Date   WBC 6.4 07/09/2021   HGB 13.4 07/09/2021   HCT 40.3 07/09/2021   MCV 85.9 07/09/2021   PLT  243 07/09/2021    Lab Results  Component Value Date   CREATININE 1.38 (H) 03/08/2022    No results found for: "PSA"  No results found for: "TESTOSTERONE"  No results found for: "HGBA1C"  Urinalysis    Component Value Date/Time   COLORURINE YELLOW 12/19/2016 0916   APPEARANCEUR Clear 09/12/2021 1109   LABSPEC 1.021 12/19/2016 0916   PHURINE 5.0 12/19/2016 0916   GLUCOSEU Negative 09/12/2021 1109   HGBUR NEGATIVE 12/19/2016 0916   BILIRUBINUR Negative 09/12/2021 1109   KETONESUR NEGATIVE 12/19/2016 0916   PROTEINUR Negative 09/12/2021 1109   PROTEINUR NEGATIVE 12/19/2016 0916   NITRITE Negative 09/12/2021 1109   NITRITE NEGATIVE 12/19/2016 0916   LEUKOCYTESUR Negative 09/12/2021 1109    Lab Results  Component Value Date   LABMICR Comment 09/12/2021   WBCUA 0-5 03/14/2021   LABEPIT 0-10 03/14/2021   BACTERIA None seen 03/14/2021    Pertinent Imaging: *** No results found for this or any previous visit.  No results found for this or any previous visit.  No results found for this or any previous visit.  No results found for this or any previous visit.  No results found for this or any previous visit.  No valid procedures specified. No results found for this or any previous visit.  No results found for this or any previous visit.   Assessment & Plan:    1. Cancer of right kidney ([place]) -followup 1 year with CMP and CXR - Urinalysis, Routine w reflex microscopic   No follow-ups on file.  Nicolette Bang, MD  [facility] [place]

## 2022-03-19 ENCOUNTER — Encounter: Payer: Self-pay | Admitting: Urology

## 2022-03-19 NOTE — Patient Instructions (Signed)
Kidney Cancer  Kidney cancer is a type of cancer in which an abnormal growth of cells (tumor) forms in one or both kidneys. The kidneys filter waste from your blood and make urine. Kidney cancer may spread to other parts of your body. This type of cancer may also be called renal cell carcinoma. What are the causes? The cause of this condition is not known. What increases the risk? You may be more likely to develop kidney cancer if: You are over age 60. You have certain conditions that are passed from parent to child (inherited). These include von Hippel-Lindau disease, tuberous sclerosis, and hereditary papillary renal carcinoma. You smoke or have been exposed to certain chemicals. You have advanced kidney disease, especially if you need to use a machine to clean your blood (dialysis). You are obese. You have high blood pressure (hypertension). You are female. What are the signs or symptoms? At first, kidney cancer may not cause any symptoms. As the cancer grows, symptoms may include: Blood in the urine. Pain in the upper back or abdomen, just below the rib cage. You may feel pain on one or both sides of your body. Tiredness (fatigue). Weight loss that cannot be explained. Fever. How is this diagnosed? This condition may be diagnosed based on: Your symptoms. Your medical history. A physical exam. You may also have tests, such as: Blood and urine tests. X-rays. Imaging tests, such as CT scans, MRIs, and PET scans. Angiogram. This is when dye is injected into your blood to show your blood vessels. Intravenous pyelogram. This is when dye is injected into your blood to show your kidneys and the other organs that help with making and storing urine. Biopsy. This is when a piece of tissue is removed from your kidney and looked at under a microscope. Your cancer will be assessed (staged), based on how severe it is and how much it has spread. How is this treated? Treatment depends on the type  and stage of the cancer. Treatment may include: Surgery to remove: Just the tumor (nephron-sparing surgery). The entire kidney (nephrectomy). The kidney, some of the healthy tissue around it, nearby lymph nodes, and sometimes the adrenal gland (radical nephrectomy). Medicines to: Kill cancer cells (chemotherapy). Help your body's disease-fighting system (immune system) fight cancer cells. This is known as immunotherapy. Radiation therapy. This uses high-energy rays to kill cancer cells. Targeted therapy to only attack genes and proteins that allow a tumor to grow. This type of therapy tries to limit damage to your healthy cells. Cryoablation. This uses gas or liquid to freeze cancer cells. It is sent through a needle. Radiofrequency ablation. This uses high-energy radio waves to destroy cancer cells. It is sent through a needle-like probe. Embolization. This is a procedure to block the artery that supplies blood to the tumor. Follow these instructions at home: Eating and drinking Some of your treatments may affect your appetite and your ability to chew and swallow. If you have problems eating, or if you do not have an appetite, meet with an expert in diet and nutrition (dietitian). If you have side effects that affect eating, it may help to: Eat smaller meals more often. Eat bland or soft foods. Avoid foods that are hot, spicy, or hard to swallow. Drink shakes or supplements that are high in nutrition and calories. Do not drink alcohol. Lifestyle Do not use any products that contain nicotine or tobacco. These products include cigarettes, chewing tobacco, and vaping devices, such as e-cigarettes. If you need   help quitting, ask your health care provider. Get enough sleep. Most adults need 6-8 hours of sleep each night. During treatment, you may need more sleep. General instructions Take over-the-counter and prescription medicines only as told by your health care provider. Consider joining a  support group. This can help you cope with the stress of having kidney cancer. Work with your health care provider to manage any side effects of treatment. Keep all follow-up visits. Your health care provider will want to make sure your treatment is working. Where to find more information American Cancer Society: cancer.org National Cancer Institute (NCI): cancer.gov Contact a health care provider if: You bruise or bleed easily. You lose weight without trying. You have new or worse fatigue or weakness. You have a fever. Your pain suddenly increases. You have more blood in your urine. Your skin or the white parts of your eyes turn yellow (jaundice). Get help right away if: You have chest pain. You are short of breath. You have irregular heartbeats (palpitations). These symptoms may be an emergency. Get help right away. Call 911. Do not wait to see if the symptoms will go away. Do not drive yourself to the hospital. This information is not intended to replace advice given to you by your health care provider. Make sure you discuss any questions you have with your health care provider. Document Revised: 07/17/2021 Document Reviewed: 07/17/2021 Elsevier Patient Education  2023 [employer]  

## 2022-07-11 ENCOUNTER — Inpatient Hospital Stay: Payer: Medicare Other | Admitting: Hematology and Oncology

## 2022-07-11 ENCOUNTER — Inpatient Hospital Stay: Payer: Medicare Other | Attending: Hematology and Oncology

## 2022-07-11 ENCOUNTER — Encounter: Payer: Self-pay | Admitting: Hematology and Oncology

## 2022-07-11 VITALS — BP 147/99 | HR 78 | Temp 98.0°F | Resp 18 | Ht 68.0 in | Wt 195.2 lb

## 2022-07-11 DIAGNOSIS — K5909 Other constipation: Secondary | ICD-10-CM

## 2022-07-11 DIAGNOSIS — C641 Malignant neoplasm of right kidney, except renal pelvis: Secondary | ICD-10-CM

## 2022-07-11 DIAGNOSIS — C8330 Diffuse large B-cell lymphoma, unspecified site: Secondary | ICD-10-CM

## 2022-07-11 DIAGNOSIS — Z85528 Personal history of other malignant neoplasm of kidney: Secondary | ICD-10-CM | POA: Diagnosis not present

## 2022-07-11 DIAGNOSIS — Z8572 Personal history of non-Hodgkin lymphomas: Secondary | ICD-10-CM | POA: Diagnosis present

## 2022-07-11 DIAGNOSIS — Z905 Acquired absence of kidney: Secondary | ICD-10-CM | POA: Insufficient documentation

## 2022-07-11 LAB — CBC WITH DIFFERENTIAL/PLATELET
Abs Immature Granulocytes: 0.01 10*3/uL (ref 0.00–0.07)
Basophils Absolute: 0 10*3/uL (ref 0.0–0.1)
Basophils Relative: 1 %
Eosinophils Absolute: 0.1 10*3/uL (ref 0.0–0.5)
Eosinophils Relative: 2 %
HCT: 40.7 % (ref 36.0–46.0)
Hemoglobin: 13.6 g/dL (ref 12.0–15.0)
Immature Granulocytes: 0 %
Lymphocytes Relative: 29 %
Lymphs Abs: 1.7 10*3/uL (ref 0.7–4.0)
MCH: 29.1 pg (ref 26.0–34.0)
MCHC: 33.4 g/dL (ref 30.0–36.0)
MCV: 87 fL (ref 80.0–100.0)
Monocytes Absolute: 0.4 10*3/uL (ref 0.1–1.0)
Monocytes Relative: 8 %
Neutro Abs: 3.4 10*3/uL (ref 1.7–7.7)
Neutrophils Relative %: 60 %
Platelets: 235 10*3/uL (ref 150–400)
RBC: 4.68 MIL/uL (ref 3.87–5.11)
RDW: 13.1 % (ref 11.5–15.5)
WBC: 5.7 10*3/uL (ref 4.0–10.5)
nRBC: 0 % (ref 0.0–0.2)

## 2022-07-11 LAB — COMPREHENSIVE METABOLIC PANEL
ALT: 11 U/L (ref 0–44)
AST: 17 U/L (ref 15–41)
Albumin: 4.4 g/dL (ref 3.5–5.0)
Alkaline Phosphatase: 57 U/L (ref 38–126)
Anion gap: 6 (ref 5–15)
BUN: 25 mg/dL — ABNORMAL HIGH (ref 8–23)
CO2: 28 mmol/L (ref 22–32)
Calcium: 9.2 mg/dL (ref 8.9–10.3)
Chloride: 104 mmol/L (ref 98–111)
Creatinine, Ser: 1.63 mg/dL — ABNORMAL HIGH (ref 0.44–1.00)
GFR, Estimated: 34 mL/min — ABNORMAL LOW (ref >60)
Glucose, Bld: 101 mg/dL — ABNORMAL HIGH (ref 70–99)
Potassium: 4.2 mmol/L (ref 3.5–5.1)
Sodium: 138 mmol/L (ref 135–145)
Total Bilirubin: 0.6 mg/dL (ref 0.3–1.2)
Total Protein: 7.5 g/dL (ref 6.5–8.1)

## 2022-07-11 NOTE — Progress Notes (Signed)
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   All questions were answered. The patient knows to call the clinic with any problems, questions or concerns. The total time spent in the appointment was 25 minutes encounter with patients including review of chart and various tests results, discussions about plan of care and coordination of care plan   Artis Delay, MD 07/11/2022 10:48 AM  INTERVAL HISTORY: Please see below for problem oriented charting. she returns for surveillance follow-up for history of lymphoma and kidney cancer She is doing well No new lymphadenopathy Appetite is stable Denies abnormal weight loss She has chronic constipation, stable  REVIEW OF SYSTEMS:   Constitutional: Denies fevers, chills or abnormal weight loss Eyes: Denies blurriness of vision Ears, nose, mouth, throat, and face: Denies mucositis or sore throat Respiratory: Denies cough, dyspnea or wheezes Cardiovascular: Denies palpitation, chest discomfort or lower extremity swelling Gastrointestinal:  Denies nausea, heartburn or change in bowel habits Skin: Denies abnormal skin rashes Lymphatics: Denies new lymphadenopathy  or easy bruising Neurological:Denies numbness, tingling or new weaknesses Behavioral/Psych: Mood is stable, no new changes  All other systems were reviewed with the patient and are negative.  I have reviewed the past medical history, past surgical history, social history and family history with the patient and they are unchanged from previous note.  ALLERGIES:  is allergic to contrast media [iodinated contrast media].  MEDICATIONS:  Current Outpatient Medications  Medication Sig Dispense Refill   cholecalciferol (VITAMIN D3) 25 MCG (1000 UNIT) tablet Take 1,000 Units by mouth daily.     Cyanocobalamin (VITAMIN B 12 PO) Take by mouth.     Multiple Vitamin (MULTIVITAMIN) tablet Take 1 tablet by mouth daily.     No current facility-administered medications for this visit.    SUMMARY OF ONCOLOGIC HISTORY: Oncology History  Diffuse large B cell lymphoma (HCC)  12/12/2016 Imaging   CT scan showed  1. Extensive mesenteric, retroperitoneal, left hilar, mediastinal, left supraclavicular and left axillary adenopathy. Differential considerations include metastatic adenopathy, lymphoma and leukemia. The size of the nodes in the mesentery and retroperitoneum are suggestive of non-Hodgkin's lymphoma. 2. 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 Differential considerations include metastatic disease and lymphoma. A left upper lobe primary lung carcinoma is a possibility. 3. 3.2 cm solid right renal mass. Differential considerations include renal cell carcinoma and oncocytoma. 4. Small amount of free peritoneal fluid, most likely due to lymphatic obstruction by the mesenteric adenopathy. 5. Left fourth rib fracture. 6. Colonic diverticulosis. 7. Calcific coronary artery and aortic atherosclerosis. Aortic Atherosclerosis (ICD10-I70.0).     12/12/2016 - 12/14/2016 Hospital Admission   He was briefly admitted and evaluated for weakness.   CT imaging showed  diffuse disease suspicious for lymphoma and outpatient evaluation was arranged    12/18/2016 Imaging   MR thoracic spine Very limited examination demonstrating compression of the cord from approximately mid T6 to T7-8 by epidural tumor eccentric to the right and surrounding the cord both anteriorly and posteriorly. Tumor fills the right neural foramina at T6-7 and T7-8 and completely replaces the T7 vertebral body.   12/18/2016 Surgery   She underwent procedure: Decompressive thoracic laminectomy from T5-T8 for resection of epidural tumor     12/18/2016 Pathology Results   1. Soft tissue mass, simple excision, thoracic dorsal epidural mass - DIFFUSE LARGE B-CELL LYMPHOMA. - SEE ONCOLOGY TABLE. 2. Soft tissue mass, simple excision, thoracic dorsal epidural mass - DIFFUSE LARGE B-CELL LYMPHOMA. Microscopic Comment 1. LYMPHOMA Histologic type: Non-Hodgkin B-cell lymphoma: Diffuse large B-cell lymphoma. Grade (if applicable): High grade. Flow cytometry: N/A. Immunohistochemical stains: CD20. CD3, CD5, CD10, CD23, CD30, CD15, PAX-5, Ki-67, bcl-2, bcl-6, CD34. Touch preps/imprints: N/A. Comments: There is a diffuse infiltrate of atypical lymphocytes. The lymphocytes are medium to large in size with irregular nuclear contours. There are background smaller lymphocytes. While there are admixed smaller cells, there are diffuse areas of large cells. The large lymphocytes are positive for CD20, CD10, bcl-6, and bcl-2. CD3 and CD5 highlight admixed T-cells. CD34, CD30, CD23, and CD15 are negative. PAX-5 is positive. Ki-67 is variable with areas up to 70%. 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    12/18/2016 - 01/17/2017 Hospital Admission   The patient was readmitted to the hospital due to complete weakness  secondary to spinal cord compression.  She underwent surgery and pathology report confirmed diagnosis of diffuse large B-cell lymphoma. She received IT chemo on 01/16/17   01/16/2017 Pathology Results   CEREBROSPINAL FLUID (SPECIMEN 1 OF 1 COLLECTED 01/16/17): NO MALIGNANT CELLS IDENTIFIED.   02/03/2017 - 02/07/2017 Hospital Admission   She is admitted for cycle 2 of chemo   02/24/2017 - 02/28/2017 Hospital Admission   She is admitted for cycle 3 of chemotherapy   03/13/2017 PET scan   1. Low level FDG uptake associated with previous areas of soft tissue tumor within the chest and abdomen. Deauville criteria 2 and 3 compatible with treated tumor. No sites of residual metabolically active tumor (Deauville criteria 4 or 5) identified. 2. Mild diffuse uptake throughout the bone marrow is noted which is nonspecific in may reflect treatment related changes. 3. Stable size of previously noted complex lesion arising from the posterior cortex of the right kidney which now measures fluid attenuation. This is incompletely characterized on today's given lack of IV contrast material. 4. Aortic Atherosclerosis (ICD10-I70.0). Multi vessel coronary artery calcifications noted.   03/13/2017 Imaging   Study Conclusions  - Procedure narrative: Transthoracic echocardiography. Image quality was suboptimal. The study was technically difficult, as a result of poor acoustic windows, poor sound wave transmission, chest wall deformity, and body habitus. - Left ventricle: GLLS is normal at -19% The cavity size was normal. There was mild focal basal hypertrophy of the septum. Systolic function was vigorous. The estimated ejection fraction was in the range of 65% to 70%. Wall motion was normal; there were no regional wall motion abnormalities. There was an increased relative contribution of atrial contraction to ventricular filling. Doppler parameters are consistent with abnormal left ventricular relaxation (grade 1 diastolic  dysfunction). - Pulmonary arteries:  Systolic pressure could not be accurately estimated.   03/24/2017 - 03/28/2017 Hospital Admission   The patient was admitted to the hospital for cycle 4 of chemotherapy   04/21/2017 - 04/25/2017 Hospital Admission   She is admitted for cycle 5 of R-EPOCH   05/19/2017 - 05/23/2017 Hospital Admission   She is admitted for cycle 6 of R-EPOCH   06/20/2017 Imaging   1. No findings for residual or recurrent lymphoma involving the chest. The left fifth rib is healed and the surrounding extensive soft tissue density has completely resolved. 2. Interval regression of the vague mesenteric and retroperitoneal soft tissue densities and hazy interstitial soft tissue thickening around the mesenteric vessels. No new or enlarging lymph nodes to  suggest recurrent lymphoma. 3. 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 4. Decompressive laminectomies in the midthoracic spine. No findings for recurrent spinal tumor.   07/04/2017 Procedure   Removal of implanted Port-A-Cath utilizing sharp and blunt dissection. The procedure was uncomplicated.   12/25/2017 Imaging   IMPRESSION: Chest Impression:  1. No evidence of lymphoma recurrence in thorax.  Abdomen / Pelvis Impression:  1. No evidence of lymphoma recurrence in the abdomen pelvis. 2. Stable hazy peritoneal fat surrounding the mesenteric vessels. 3. Stable ill-defined nodular density in the LEFT retroperitoneum adjacent to the aorta. 4. Interval increase in size of RIGHT renal neoplasm   07/03/2018 Imaging   No findings suspicious for active lymphoma in the chest, abdomen, or pelvis. Spleen is normal in size.    Stable vague hazy soft tissue in the retroperitoneum/mesentery, possibly reflecting treated lymphoma.   Status post right nephrectomy. No evidence of recurrent or metastatic disease.   Mild ground-glass opacity in the medial right lower lobe, nonspecific, possibly  reflecting mild infection.     01/04/2019 Imaging   1. No evidence of recurrent lymphoma. 2. Aortic atherosclerosis (ICD10-170.0). Coronary artery calcification. 3. Enlarged pulmonic trunk, indicative of pulmonary arterial hypertension   01/07/2020 Imaging   1. Stable CT of the chest, abdomen and pelvis. No specific findings identified to suggest residual or recurrent tumor or metastatic disease. 2. Coronary artery calcifications noted. 3. Status post right nephrectomy. 4. Similar appearance of hazy soft tissue stranding within the small bowel mesentery likely reflecting treated lymphoma. 5. Aortic atherosclerosis.   Cancer of right kidney [facility])  06/11/2018 Surgery   Procedure: 1.  right robot assisted laparoscopic radical nephrectomy   Attending: Wilkie Aye    Specimens: right radical nephrectomy    Findings: 1 renal artery and 1 renal vein. 2cm liver laceration requiring surgicel and surgiflo. The assistant was utilized for retraction, suction, passing instruments, and deploying the stapler cross the renal vein.     06/11/2018 Surgery   Kidney, radical nephrectomy for tumor, right RENAL CELL CARCINOMA, CONVENTIONAL CLEAR CELL TYPE, NUCLEAR GRADE 2 (SIZE 3.6 CM) TUMOR IS LIMITED TO THE KIDNEY (PT1A) LYMPHOVASCULAR INVASION IS IDENTIFIED URETERAL, VASCULAR AND ALL RESECTION MARGINS ARE NEGATIVE FOR TUMOR Microscopic Comment KIDNEY: Procedure: Radical Nephrectomy Specimen Laterality: Right Tumor Size: 3.6 cm Tumor Focality: Focal Histologic Type: Clear cell RCC Sarcomatoid Features: Negative Rhabdoid Features: Negative Histologic Grade: 2 Tumor Necrosis: Negative Tumor Extension: Negative Margins: Negative Regional Lymph Nodes: X No lymph nodes submitted or found Number of Lymph Nodes Involved: NA Number of Lymph Nodes Examined: NA Pathologic Stage Classification (pTNM, AJCC 8th Edition): pT 1a, pNx Pathologic Findings in Nonneoplastic Kidney: Chronic interstitial  inflammation   06/26/2018 Cancer Staging   Staging form:  Kidney, AJCC 8th Edition - Pathologic: Stage I (pT1a, pN0, cM0) - Signed by Artis Delay, MD on 06/26/2018     PHYSICAL EXAMINATION: ECOG PERFORMANCE STATUS: 0 - Asymptomatic  Vitals:   07/11/22 1041  BP: (!) 147/99  Pulse: 78  Resp: 18  Temp: 98 F (36.7 C)  SpO2: 99%   Filed Weights   07/11/22 1041  Weight: 195 lb 3.2 oz (88.5 kg)    GENERAL:alert, no distress and comfortable SKIN: skin color, texture, turgor are normal, no rashes or significant lesions EYES: normal, Conjunctiva are pink and non-injected, sclera clear OROPHARYNX:no exudate, no erythema and lips, buccal mucosa, and tongue normal  NECK: supple, thyroid normal size, non-tender, without nodularity LYMPH:  no palpable lymphadenopathy in the cervical, axillary or inguinal LUNGS: clear to auscultation and percussion with normal breathing effort HEART: regular rate & rhythm and no murmurs and no lower extremity edema ABDOMEN:abdomen soft, non-tender and normal bowel sounds Musculoskeletal:no cyanosis of digits and no clubbing  NEURO: alert & oriented x 3 with fluent speech, no focal motor/sensory deficits  LABORATORY DATA:  I have reviewed the data as listed    Component Value Date/Time   NA 139 03/08/2022 0944   NA 134 (L) 02/19/2017 0925   K 4.4 03/08/2022 0944   K 3.0 (LL) 02/19/2017 0925   CL 102 03/08/2022 0944   CO2 21 03/08/2022 0944   CO2 29 02/19/2017 0925   GLUCOSE 97 03/08/2022 0944   GLUCOSE 121 (H) 07/09/2021 1026   GLUCOSE 135 02/19/2017 0925   BUN 20 03/08/2022 0944   BUN 7.4 02/19/2017 0925   CREATININE 1.38 (H) 03/08/2022 0944   CREATININE 0.9 02/19/2017 0925   CALCIUM 9.5 03/08/2022 0944   CALCIUM 8.9 02/19/2017 0925   PROT 6.9 03/08/2022 0944   PROT 6.4 02/19/2017 0925   ALBUMIN 4.4 03/08/2022 0944   ALBUMIN 3.4 (L) 02/19/2017 0925   AST 15 03/08/2022 0944   AST 16 02/19/2017 0925   ALT 9 03/08/2022 0944   ALT 13  02/19/2017 0925   ALKPHOS 67 03/08/2022 0944   ALKPHOS 86 02/19/2017 0925   BILITOT 0.4 03/08/2022 0944   BILITOT 0.65 02/19/2017 0925   GFRNONAA 36 (L) 07/09/2021 1026   GFRAA 35 (L) 07/06/2019 1052    No results found for: "SPEP", "UPEP"  Lab Results  Component Value Date   WBC 5.7 07/11/2022   NEUTROABS 3.4 07/11/2022   HGB 13.6 07/11/2022   HCT 40.7 07/11/2022   MCV 87.0 07/11/2022   PLT 235 07/11/2022      Chemistry      Component Value Date/Time   NA 139 03/08/2022 0944   NA 134 (L) 02/19/2017 0925   K 4.4 03/08/2022 0944   K 3.0 (LL) 02/19/2017 0925   CL 102 03/08/2022 0944   CO2 21 03/08/2022 0944   CO2 29 02/19/2017 0925   BUN 20 03/08/2022 0944   BUN 7.4 02/19/2017 0925   CREATININE 1.38 (H) 03/08/2022 0944   CREATININE 0.9 02/19/2017 0925      Component Value Date/Time   CALCIUM 9.5 03/08/2022 0944   CALCIUM 8.9 02/19/2017 0925   ALKPHOS 67 03/08/2022 0944   ALKPHOS 86 02/19/2017 0925   AST 15 03/08/2022 0944   AST 16 02/19/2017 0925   ALT 9 03/08/2022 0944   ALT 13 02/19/2017 0925   BILITOT 0.4 03/08/2022 0944   BILITOT 0.65 02/19/2017 0925

## 2022-07-11 NOTE — Assessment & Plan Note (Signed)
This is chronic in nature I recommend regular stool softener and increase oral fluid hydration as tolerated

## 2022-07-11 NOTE — Assessment & Plan Note (Signed)
Examination is benign  The patient is reassured She has no signs of cancer recurrence and is now a 5-year survivor I will discharge her from the clinic The patient is educated to watch out for signs and symptoms of disease recurrence

## 2022-07-11 NOTE — Assessment & Plan Note (Signed)
She will continue follow-up with urologist Her examination is benign

## 2022-11-11 ENCOUNTER — Encounter (HOSPITAL_COMMUNITY): Payer: Self-pay | Admitting: Hematology and Oncology

## 2022-11-11 DIAGNOSIS — Z1231 Encounter for screening mammogram for malignant neoplasm of breast: Secondary | ICD-10-CM

## 2022-12-15 NOTE — Patient Instructions (Signed)

## 2022-12-15 NOTE — Progress Notes (Unsigned)
New Patient Office Visit   Subjective   Patient ID: EVELIN ECK, female    DOB: 1950/04/18  Age: 72 y.o. MRN: [id number]  CC: No chief complaint on file.   HPI UNIQUA RUSCOE 72 year old female, presents to establish care. She  has a past medical history of Anemia, Arthritis, Chest wall mass (11/2016), Hypertension, Lymphadenopathy (11/2016), Non Hodgkin's lymphoma (HCC) (2019), Paralysis (HCC), Paresthesia of lower extremity (11/2016), Pulmonary nodules (11/2016), Renal cancer, right (HCC) (dx'd 2019), and Right renal mass (11/2016).  HPI    Outpatient Encounter Medications as of 12/16/2022  Medication Sig   cholecalciferol (VITAMIN D3) 25 MCG (1000 UNIT) tablet Take 1,000 Units by mouth daily.   Cyanocobalamin (VITAMIN B 12 PO) Take by mouth.   Multiple Vitamin (MULTIVITAMIN) tablet Take 1 tablet by mouth daily.   No facility-administered encounter medications on file as of 12/16/2022.    Past Surgical History:  Procedure Laterality Date   ABDOMINAL HYSTERECTOMY     BREAST LUMPECTOMY     left   COLONOSCOPY N/A 11/02/2013   Procedure: COLONOSCOPY;  Surgeon: West Bali, MD;  Location: [facility];  Service: Endoscopy;  Laterality: N/A;  1:15 - moved to 845 - Kim notified pt   COLONOSCOPY N/A 04/17/2018   Procedure: COLONOSCOPY;  Surgeon: West Bali, MD;  Location: [facility];  Service: Endoscopy;  Laterality: N/A;  12:00   IR FLUORO GUIDE PORT INSERTION RIGHT  12/30/2016   IR REMOVAL TUN ACCESS W/ PORT W/O FL MOD SED  07/04/2017   IR US GUIDE VASC ACCESS RIGHT  12/30/2016   LAMINECTOMY N/A 12/18/2016   Procedure: DECOMPRESSION T5-8/THORACIC;  Surgeon: Donalee Citrin, MD;  Location: [facility] OR;  Service: Neurosurgery;  Laterality: N/A;  Decompressive Thoracic Laminectomy, Thoracic five - six, Thoraic six - seven, Thoracic seven - eight   POLYPECTOMY  04/17/2018   Procedure: POLYPECTOMY;  Surgeon: West Bali, MD;  Location: [facility];  Service: Endoscopy;;   splenic flexure   ROBOT ASSISTED LAPAROSCOPIC NEPHRECTOMY Right 06/11/2018   Procedure: XI ROBOTIC ASSISTED LAPAROSCOPIC NEPHRECTOMY;  Surgeon: Malen Gauze, MD;  Location: [facility];  Service: Urology;  Laterality: Right;  2.5 HRS   SHOULDER SURGERY     right    ROS    Objective    There were no vitals taken for this visit.  Physical Exam    Assessment & Plan:  There are no diagnoses linked to this encounter.  No follow-ups on file.   Cruzita Lederer Newman Nip, FNP

## 2022-12-16 ENCOUNTER — Ambulatory Visit (INDEPENDENT_AMBULATORY_CARE_PROVIDER_SITE_OTHER): Payer: Medicare Other | Admitting: Family Medicine

## 2022-12-16 ENCOUNTER — Encounter: Payer: Self-pay | Admitting: Family Medicine

## 2022-12-16 VITALS — BP 130/83 | HR 86 | Ht 68.0 in | Wt 201.1 lb

## 2022-12-16 DIAGNOSIS — E559 Vitamin D deficiency, unspecified: Secondary | ICD-10-CM

## 2022-12-16 DIAGNOSIS — R7301 Impaired fasting glucose: Secondary | ICD-10-CM

## 2022-12-16 DIAGNOSIS — M810 Age-related osteoporosis without current pathological fracture: Secondary | ICD-10-CM

## 2022-12-16 DIAGNOSIS — Z6830 Body mass index (BMI) 30.0-30.9, adult: Secondary | ICD-10-CM

## 2022-12-16 DIAGNOSIS — Z1159 Encounter for screening for other viral diseases: Secondary | ICD-10-CM

## 2022-12-16 DIAGNOSIS — E038 Other specified hypothyroidism: Secondary | ICD-10-CM

## 2022-12-16 DIAGNOSIS — Z136 Encounter for screening for cardiovascular disorders: Secondary | ICD-10-CM | POA: Insufficient documentation

## 2022-12-16 DIAGNOSIS — D509 Iron deficiency anemia, unspecified: Secondary | ICD-10-CM

## 2022-12-16 DIAGNOSIS — Z1231 Encounter for screening mammogram for malignant neoplasm of breast: Secondary | ICD-10-CM

## 2022-12-16 NOTE — Assessment & Plan Note (Signed)

## 2022-12-16 NOTE — Addendum Note (Signed)
Addended by: Herbie Saxon on: 12/16/2022 04:28 PM   Modules accepted: Orders

## 2022-12-16 NOTE — Assessment & Plan Note (Signed)
Continued discussion adhering to weight loss plan, with strong emphasize on nutrition and exercise. Read food labels to know how many calories are in each serving , Increase water intake 2-3 L a day. Include more protein intake such as lean meat, poultry, fish. Increase fiber intake such as vegetables, whole grains, fruits, artichokes, green peas, broccoli, lentils and lima beans.

## 2022-12-18 LAB — MICROALBUMIN / CREATININE URINE RATIO
Creatinine, Urine: 26.7 mg/dL
Microalb/Creat Ratio: 14 mg/g{creat} (ref 0–29)
Microalbumin, Urine: 3.8 ug/mL

## 2022-12-19 ENCOUNTER — Other Ambulatory Visit: Payer: Self-pay | Admitting: Family Medicine

## 2022-12-19 LAB — IRON,TIBC AND FERRITIN PANEL
Ferritin: 224 ng/mL — ABNORMAL HIGH (ref 15–150)
Iron Saturation: 29 % (ref 15–55)
Iron: 87 ug/dL (ref 27–139)
Total Iron Binding Capacity: 297 ug/dL (ref 250–450)
UIBC: 210 ug/dL (ref 118–369)

## 2022-12-19 LAB — CBC WITH DIFFERENTIAL/PLATELET
Basophils Absolute: 0.1 10*3/uL (ref 0.0–0.2)
Basos: 1 %
EOS (ABSOLUTE): 0.2 10*3/uL (ref 0.0–0.4)
Eos: 3 %
Hematocrit: 41.7 % (ref 34.0–46.6)
Hemoglobin: 13.4 g/dL (ref 11.1–15.9)
Immature Grans (Abs): 0 10*3/uL (ref 0.0–0.1)
Immature Granulocytes: 0 %
Lymphocytes Absolute: 1.8 10*3/uL (ref 0.7–3.1)
Lymphs: 29 %
MCH: 28.5 pg (ref 26.6–33.0)
MCHC: 32.1 g/dL (ref 31.5–35.7)
MCV: 89 fL (ref 79–97)
Monocytes Absolute: 0.5 10*3/uL (ref 0.1–0.9)
Monocytes: 8 %
Neutrophils Absolute: 3.6 10*3/uL (ref 1.4–7.0)
Neutrophils: 59 %
Platelets: 259 10*3/uL (ref 150–450)
RBC: 4.71 x10E6/uL (ref 3.77–5.28)
RDW: 13.1 % (ref 11.7–15.4)
WBC: 6.1 10*3/uL (ref 3.4–10.8)

## 2022-12-19 LAB — CMP14+EGFR
ALT: 13 [IU]/L (ref 0–32)
AST: 17 [IU]/L (ref 0–40)
Albumin: 4.5 g/dL (ref 3.9–4.9)
Alkaline Phosphatase: 74 [IU]/L (ref 44–121)
BUN/Creatinine Ratio: 18 (ref 12–28)
BUN: 25 mg/dL (ref 8–27)
Bilirubin Total: 0.4 mg/dL (ref 0.0–1.2)
CO2: 23 mmol/L (ref 20–29)
Calcium: 9.6 mg/dL (ref 8.7–10.3)
Chloride: 100 mmol/L (ref 96–106)
Creatinine, Ser: 1.36 mg/dL — ABNORMAL HIGH (ref 0.57–1.00)
Globulin, Total: 2.6 g/dL (ref 1.5–4.5)
Glucose: 100 mg/dL — ABNORMAL HIGH (ref 70–99)
Potassium: 4.2 mmol/L (ref 3.5–5.2)
Sodium: 139 mmol/L (ref 134–144)
Total Protein: 7.1 g/dL (ref 6.0–8.5)
eGFR: 42 mL/min/{1.73_m2} — ABNORMAL LOW (ref >59)

## 2022-12-19 LAB — LIPID PANEL
Chol/HDL Ratio: 3.7 ratio (ref 0.0–4.4)
Cholesterol, Total: 191 mg/dL (ref 100–199)
HDL: 52 mg/dL (ref >39)
LDL Chol Calc (NIH): 111 mg/dL — ABNORMAL HIGH (ref 0–99)
Triglycerides: 163 mg/dL — ABNORMAL HIGH (ref 0–149)
VLDL Cholesterol Cal: 28 mg/dL (ref 5–40)

## 2022-12-19 LAB — TSH+FREE T4
Free T4: 1.17 ng/dL (ref 0.82–1.77)
TSH: 1.98 u[IU]/mL (ref 0.450–4.500)

## 2022-12-19 LAB — HEMOGLOBIN A1C
Est. average glucose Bld gHb Est-mCnc: 111 mg/dL
Hgb A1c MFr Bld: 5.5 % (ref 4.8–5.6)

## 2022-12-19 LAB — HEPATITIS C ANTIBODY: Hep C Virus Ab: NONREACTIVE

## 2022-12-19 LAB — VITAMIN B12: Vitamin B-12: 1155 pg/mL (ref 232–1245)

## 2022-12-19 LAB — VITAMIN D 25 HYDROXY (VIT D DEFICIENCY, FRACTURES): Vit D, 25-Hydroxy: 51.6 ng/mL (ref 30.0–100.0)

## 2022-12-19 MED ORDER — ROSUVASTATIN CALCIUM 40 MG PO TABS: 40.0000 mg | ORAL_TABLET | Freq: Every day | ORAL | 3 refills | Status: DC

## 2022-12-20 ENCOUNTER — Ambulatory Visit (HOSPITAL_COMMUNITY)
Admission: RE | Admit: 2022-12-20 | Discharge: 2022-12-20 | Disposition: A | Discharge status: Home / Self Care | Payer: Medicare Other | Source: Ambulatory Visit | Attending: Family Medicine

## 2022-12-20 DIAGNOSIS — M810 Age-related osteoporosis without current pathological fracture: Secondary | ICD-10-CM | POA: Diagnosis present

## 2023-01-08 ENCOUNTER — Telehealth: Payer: Self-pay

## 2023-01-08 NOTE — Telephone Encounter (Signed)
Opened by mistake.

## 2023-02-25 ENCOUNTER — Other Ambulatory Visit (HOSPITAL_COMMUNITY): Payer: Self-pay | Admitting: Family Medicine

## 2023-02-25 DIAGNOSIS — Z1231 Encounter for screening mammogram for malignant neoplasm of breast: Secondary | ICD-10-CM

## 2023-03-07 ENCOUNTER — Encounter (HOSPITAL_COMMUNITY): Payer: Self-pay

## 2023-03-07 ENCOUNTER — Ambulatory Visit (HOSPITAL_COMMUNITY)
Admission: RE | Admit: 2023-03-07 | Discharge: 2023-03-07 | Disposition: A | Discharge status: Home / Self Care | Payer: Medicare Other | Source: Ambulatory Visit

## 2023-03-07 ENCOUNTER — Ambulatory Visit (HOSPITAL_COMMUNITY)
Admission: RE | Admit: 2023-03-07 | Discharge: 2023-03-07 | Disposition: A | Discharge status: Home / Self Care | Payer: Medicare Other | Source: Ambulatory Visit | Attending: Urology

## 2023-03-07 DIAGNOSIS — Z1231 Encounter for screening mammogram for malignant neoplasm of breast: Secondary | ICD-10-CM

## 2023-03-07 DIAGNOSIS — C641 Malignant neoplasm of right kidney, except renal pelvis: Secondary | ICD-10-CM | POA: Diagnosis present

## 2023-03-12 ENCOUNTER — Other Ambulatory Visit: Payer: Medicare Other

## 2023-03-12 DIAGNOSIS — C641 Malignant neoplasm of right kidney, except renal pelvis: Secondary | ICD-10-CM

## 2023-03-13 LAB — COMPREHENSIVE METABOLIC PANEL
ALT: 14 [IU]/L (ref 0–32)
AST: 20 [IU]/L (ref 0–40)
Albumin: 4.5 g/dL (ref 3.8–4.8)
Alkaline Phosphatase: 73 [IU]/L (ref 44–121)
BUN/Creatinine Ratio: 18 (ref 12–28)
BUN: 26 mg/dL (ref 8–27)
Bilirubin Total: 0.3 mg/dL (ref 0.0–1.2)
CO2: 22 mmol/L (ref 20–29)
Calcium: 9.8 mg/dL (ref 8.7–10.3)
Chloride: 101 mmol/L (ref 96–106)
Creatinine, Ser: 1.45 mg/dL — ABNORMAL HIGH (ref 0.57–1.00)
Globulin, Total: 2.8 g/dL (ref 1.5–4.5)
Glucose: 93 mg/dL (ref 70–99)
Potassium: 4 mmol/L (ref 3.5–5.2)
Sodium: 141 mmol/L (ref 134–144)
Total Protein: 7.3 g/dL (ref 6.0–8.5)
eGFR: 39 mL/min/{1.73_m2} — ABNORMAL LOW (ref >59)

## 2023-03-19 ENCOUNTER — Ambulatory Visit: Payer: Medicare Other | Admitting: Urology

## 2023-03-19 VITALS — BP 137/86 | HR 88

## 2023-03-19 DIAGNOSIS — N1831 Chronic kidney disease, stage 3a: Secondary | ICD-10-CM

## 2023-03-19 DIAGNOSIS — Z08 Encounter for follow-up examination after completed treatment for malignant neoplasm: Secondary | ICD-10-CM

## 2023-03-19 DIAGNOSIS — Z85528 Personal history of other malignant neoplasm of kidney: Secondary | ICD-10-CM

## 2023-03-19 DIAGNOSIS — C641 Malignant neoplasm of right kidney, except renal pelvis: Secondary | ICD-10-CM

## 2023-03-19 LAB — URINALYSIS, ROUTINE W REFLEX MICROSCOPIC
Bilirubin, UA: NEGATIVE
Glucose, UA: NEGATIVE
Leukocytes,UA: NEGATIVE
Nitrite, UA: NEGATIVE
RBC, UA: NEGATIVE
Specific Gravity, UA: 1.02 (ref 1.005–1.030)
Urobilinogen, Ur: 0.2 mg/dL (ref 0.2–1.0)
pH, UA: 6 (ref 5.0–7.5)

## 2023-03-19 NOTE — Progress Notes (Signed)
03/19/2023 9:41 AM   Katrina [name] 1969-12-23 [id number]  Referring provider: Gareth Morgan, MD [address] [place],  [place] [address]  Followup RCC   HPI: Katrina [name] is a 73yo here for followup for right RCC. Creatinine has fluctuated from 1.3-1.6. CXR shows no evidence of metastatic  disease. 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   Paresthesia of lower extremity 11/2016   bilateral   Pulmonary nodules 11/2016   Renal cancer, right (HCC) dx'd 2019   Right renal mass 11/2016    Surgical History: Past Surgical History:  Procedure Laterality Date   ABDOMINAL HYSTERECTOMY     BREAST CYST EXCISION Left    age 34's   COLONOSCOPY N/A 11/02/2013   Procedure: COLONOSCOPY;  Surgeon: West Bali, MD;  [facility];  Service: Endoscopy;  Laterality: N/A;  1:15 - moved to 845 - Kim notified pt   COLONOSCOPY N/A 04/17/2018   Procedure: COLONOSCOPY;  Surgeon: West Bali, MD;  [facility]: [facility];  Service: Endoscopy;  Laterality: N/A;  12:00   IR FLUORO GUIDE PORT INSERTION RIGHT  12/30/2016   IR REMOVAL TUN ACCESS W/ PORT W/O FL MOD SED  07/04/2017   IR US GUIDE VASC ACCESS RIGHT  12/30/2016   LAMINECTOMY N/A 12/18/2016   Procedure: DECOMPRESSION T5-8/THORACIC;  Surgeon: Donalee Citrin, MD;  Location: [facility] OR;  Service: Neurosurgery;  Laterality: N/A;  Decompressive Thoracic Laminectomy, Thoracic five - six, Thoraic six - seven, Thoracic seven - eight   POLYPECTOMY  04/17/2018   Procedure: POLYPECTOMY;  Surgeon: West Bali, MD;  [facility];  Service: Endoscopy;;  splenic flexure   ROBOT ASSISTED LAPAROSCOPIC NEPHRECTOMY Right 06/11/2018   Procedure: XI ROBOTIC ASSISTED LAPAROSCOPIC NEPHRECTOMY;  Surgeon: Malen Gauze, MD;   Location: [facility];  Service: Urology;  Laterality: Right;  2.5 HRS   SHOULDER SURGERY     right    Home Medications:  Allergies as of 03/19/2023       Reactions   Contrast Media [iodinated Contrast Media] Hives, Itching        Medication List        Accurate as of March 19, 2023  9:41 AM. If you have any questions, ask your nurse or doctor.          ascorbic acid 500 MG tablet Commonly known as: VITAMIN C Take 500 mg by mouth daily.   cholecalciferol 25 MCG (1000 UNIT) tablet Commonly known as: VITAMIN D3 Take 1,000 Units by mouth daily.   diphenhydrAMINE 25 mg capsule Commonly known as: BENADRYL Take 25 mg by mouth every 6 (six) hours as needed for allergies (for sinus pressure.).   diphenhydramine-acetaminophen 25-500 MG Tabs tablet Commonly known as: TYLENOL PM Take 1 tablet by mouth at bedtime as needed (takes for assistance sleeping.).   docusate sodium 100 MG capsule Commonly known as: COLACE Take 100 mg by mouth 2 (two) times daily.   magnesium gluconate 500 MG tablet Commonly known as: MAGONATE Take 500 mg by mouth 2 (two) times daily.   multivitamin tablet Take 1 tablet by mouth daily.   rosuvastatin 40 MG tablet Commonly known as: CRESTOR Take 1 tablet (40 mg total) by mouth  daily.   triprolidine-pseudoephedrine 2.5-60 MG Tabs tablet Commonly known as: APRODINE Take 2 tablets by mouth every 6 (six) hours as needed for allergies.   VITAMIN B 12 PO Take by mouth.        Allergies:  Allergies  Allergen Reactions   Contrast Media [Iodinated Contrast Media] Hives and Itching    Family History: Family History  Problem Relation Age of Onset   COPD Mother    Diabetes Mellitus II Mother    CAD Mother        Angina   Colon cancer Father    Colon cancer Sister        at age 22   Thyroid disease Sister    Diabetes Mellitus II Sister    Colon cancer Brother        at age 7   Lung cancer Brother    GER disease Son     Social  History:  reports that she quit smoking about 27 years ago. Her smoking use included cigarettes. She started smoking about 42 years ago. She has a 15 pack-year smoking history. She has never used smokeless tobacco. She reports that she does not drink alcohol and does not use drugs.  ROS: All other review of systems were reviewed and are negative except what is noted above in HPI  Physical Exam: BP 137/86   Pulse 88   Constitutional:  Alert and oriented, No acute distress. HEENT: [place] AT, moist mucus membranes.  Trachea midline, no masses. Cardiovascular: No clubbing, cyanosis, or edema. Respiratory: Normal respiratory effort, no increased work of breathing. GI: Abdomen is soft, nontender, nondistended, no abdominal masses GU: No CVA tenderness.  Lymph: No cervical or inguinal lymphadenopathy. Skin: No rashes, bruises or suspicious lesions. Neurologic: Grossly intact, no focal deficits, moving all 4 extremities. Psychiatric: Normal mood and affect.  Laboratory Data: Lab Results  Component Value Date   WBC 6.1 12/18/2022   HGB 13.4 12/18/2022   HCT 41.7 12/18/2022   MCV 89 12/18/2022   PLT 259 12/18/2022    Lab Results  Component Value Date   CREATININE 1.45 (H) 03/12/2023    No results found for: "PSA"  No results found for: "TESTOSTERONE"  Lab Results  Component Value Date   HGBA1C 5.5 12/18/2022    Urinalysis    Component Value Date/Time   COLORURINE YELLOW 12/19/2016 0916   APPEARANCEUR Clear 03/15/2022 0919   LABSPEC 1.021 12/19/2016 0916   PHURINE 5.0 12/19/2016 0916   GLUCOSEU Negative 03/15/2022 0919   HGBUR NEGATIVE 12/19/2016 0916   BILIRUBINUR Negative 03/15/2022 0919   KETONESUR NEGATIVE 12/19/2016 0916   PROTEINUR 2+ (A) 03/15/2022 0919   PROTEINUR NEGATIVE 12/19/2016 0916   NITRITE Negative 03/15/2022 0919   NITRITE NEGATIVE 12/19/2016 0916   LEUKOCYTESUR Negative 03/15/2022 0919    Lab Results  Component Value Date   LABMICR 3.8 12/16/2022    WBCUA 0-5 03/15/2022   LABEPIT >10 (A) 03/15/2022   MUCUS Present (A) 03/15/2022   BACTERIA None seen 03/15/2022    Pertinent Imaging:  No results found for this or any previous visit.  No results found for this or any previous visit.  No results found for this or any previous visit.  No results found for this or any previous visit.  No results found for this or any previous visit.  No results found for this or any previous visit.  No results found for this or any previous visit.  No results found for this or any  previous visit.   Assessment & Plan:    1. Cancer of right kidney (HCC) (Primary) -Patient has been cancer free for 5 years. Her creatinine continues to be elevated after nephrectomy. I will refer her to Nephrology.  -followup PRN - Urinalysis, Routine w reflex microscopic   No follow-ups on file.  Wilkie Aye, MD  [facility] [place]

## 2023-03-21 ENCOUNTER — Encounter: Payer: Self-pay | Admitting: Urology

## 2023-03-21 NOTE — Patient Instructions (Signed)
 Kidney Cancer  Kidney cancer is a type of cancer in which an abnormal growth of cells (tumor) forms in one or both kidneys. The kidneys filter waste from your blood and make urine. Kidney cancer may spread to other parts of your body. This type of cancer may also be called renal cell carcinoma. What are the causes? The cause of this condition is not known. What increases the risk? You may be more likely to develop kidney cancer if: You are over age 72. You have certain conditions that are passed from parent to child (inherited). These include von Hippel-Lindau disease, tuberous sclerosis, and hereditary papillary renal carcinoma. You smoke or have been exposed to certain chemicals. You have advanced kidney disease, especially if you need to use a machine to clean your blood (dialysis). You are obese. You have high blood pressure (hypertension). You are female. What are the signs or symptoms? At first, kidney cancer may not cause any symptoms. As the cancer grows, symptoms may include: Blood in the urine. Pain in the upper back or abdomen, just below the rib cage. You may feel pain on one or both sides of your body. Tiredness (fatigue). Weight loss that cannot be explained. Fever. How is this diagnosed? This condition may be diagnosed based on: Your symptoms. Your medical history. A physical exam. You may also have tests, such as: Blood and urine tests. X-rays. Imaging tests, such as CT scans, MRIs, and PET scans. Angiogram. This is when dye is injected into your blood to show your blood vessels. Intravenous pyelogram. This is when dye is injected into your blood to show your kidneys and the other organs that help with making and storing urine. Biopsy. This is when a piece of tissue is removed from your kidney and looked at under a microscope. Your cancer will be assessed (staged), based on how severe it is and how much it has spread. How is this treated? Treatment depends on the type  and stage of the cancer. Treatment may include: Surgery to remove: Just the tumor (nephron-sparing surgery). The entire kidney (nephrectomy). The kidney, some of the healthy tissue around it, nearby lymph nodes, and sometimes the adrenal gland (radical nephrectomy). Medicines to: Kill cancer cells (chemotherapy). Help your body's disease-fighting system (immune system) fight cancer cells. This is known as immunotherapy. Radiation therapy. This uses high-energy rays to kill cancer cells. Targeted therapy to only attack genes and proteins that allow a tumor to grow. This type of therapy tries to limit damage to your healthy cells. Cryoablation. This uses gas or liquid to freeze cancer cells. It is sent through a needle. Radiofrequency ablation. This uses high-energy radio waves to destroy cancer cells. It is sent through a needle-like probe. Embolization. This is a procedure to block the artery that supplies blood to the tumor. Follow these instructions at home: Eating and drinking Some of your treatments may affect your appetite and your ability to chew and swallow. If you have problems eating, or if you do not have an appetite, meet with an expert in diet and nutrition (dietitian). If you have side effects that affect eating, it may help to: Eat smaller meals more often. Eat bland or soft foods. Avoid foods that are hot, spicy, or hard to swallow. Drink shakes or supplements that are high in nutrition and calories. Do not drink alcohol. Lifestyle Do not use any products that contain nicotine or tobacco. These products include cigarettes, chewing tobacco, and vaping devices, such as e-cigarettes. If you need  help quitting, ask your health care provider. Get enough sleep. Most adults need 6-8 hours of sleep each night. During treatment, you may need more sleep. General instructions Take over-the-counter and prescription medicines only as told by your health care provider. Consider joining a  support group. This can help you cope with the stress of having kidney cancer. Work with your health care provider to manage any side effects of treatment. Keep all follow-up visits. Your health care provider will want to make sure your treatment is working. Where to find more information American Cancer Society: cancer.org [employer] (NCI): cancer.gov Contact a health care provider if: You bruise or bleed easily. You lose weight without trying. You have new or worse fatigue or weakness. You have a fever. Your pain suddenly increases. You have more blood in your urine. Your skin or the white parts of your eyes turn yellow (jaundice). Get help right away if: You have chest pain. You are short of breath. You have irregular heartbeats (palpitations). These symptoms may be an emergency. Get help right away. Call 911. Do not wait to see if the symptoms will go away. Do not drive yourself to the hospital. This information is not intended to replace advice given to you by your health care provider. Make sure you discuss any questions you have with your health care provider. Document Revised: 07/17/2021 Document Reviewed: 07/17/2021 Elsevier Patient Education  2024 [employer].

## 2023-03-25 ENCOUNTER — Encounter (INDEPENDENT_AMBULATORY_CARE_PROVIDER_SITE_OTHER): Payer: Self-pay | Admitting: *Deleted

## 2023-04-10 NOTE — Progress Notes (Deleted)
 Referring Provider:*** Primary Care Physician:  Rica Records, FNP Primary Gastroenterologist:  Dr. Marletta Lor  No chief complaint on file.   HPI:   Katrina [name] is a 73 y.o. female with history of adenomatous colon polyps and family history of multiple first degree relatives with colon cancer presenting today to discuss scheduling surveillance colonoscopy. OV recommended due to reports of constipation of colonoscopy questionnaire.   Last colonoscopy 04/17/18:  - One 4 mm polyp at the splenic flexure, removed with a cold snare. Resected and retrieved.  - Diverticulosis in the recto- sigmoid colon, in the sigmoid colon, in the descending colon and at the splenic flexure.  - External and internal hemorrhoids.  - Tortuous LEFT colon. - Pathology with tubular adenoma.  - Recommended 5 year surveillance.    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   Paresthesia of lower extremity 11/2016   bilateral   Pulmonary nodules 11/2016   Renal cancer, right (HCC) dx'd 2019   Right renal mass 11/2016    Past Surgical History:  Procedure Laterality Date   ABDOMINAL HYSTERECTOMY     BREAST CYST EXCISION Left    age 52's   COLONOSCOPY N/A 11/02/2013   Procedure: COLONOSCOPY;  Surgeon: West Bali, MD;  [facility];  Service: Endoscopy;  Laterality: N/A;  1:15 - moved to 845 - Kim notified pt   COLONOSCOPY N/A 04/17/2018   Procedure: COLONOSCOPY;  Surgeon: West Bali, MD;  [facility];  Service: Endoscopy;  Laterality: N/A;  12:00   IR FLUORO GUIDE PORT INSERTION RIGHT  12/30/2016   IR REMOVAL TUN ACCESS W/ PORT W/O FL MOD SED  07/04/2017   IR US GUIDE VASC ACCESS RIGHT  12/30/2016   LAMINECTOMY N/A 12/18/2016   Procedure: DECOMPRESSION T5-8/THORACIC;  Surgeon:  Donalee Citrin, MD;  Location: [facility] OR;  Service: Neurosurgery;  Laterality: N/A;  Decompressive Thoracic Laminectomy, Thoracic five - six, Thoraic six - seven, Thoracic seven - eight   POLYPECTOMY  04/17/2018   Procedure: POLYPECTOMY;  Surgeon: West Bali, MD;  [facility];  Service: Endoscopy;;  splenic flexure   ROBOT ASSISTED LAPAROSCOPIC NEPHRECTOMY Right 06/11/2018   Procedure: XI ROBOTIC ASSISTED LAPAROSCOPIC NEPHRECTOMY;  Surgeon: Malen Gauze, MD;  Location: [facility];  Service: Urology;  Laterality: Right;  2.5 HRS   SHOULDER SURGERY     right    Current Outpatient Medications  Medication Sig Dispense Refill   ascorbic acid (VITAMIN C) 500 MG tablet Take 500 mg by mouth daily.     cholecalciferol (VITAMIN D3) 25 MCG (1000 UNIT) tablet Take 1,000 Units by mouth daily.     Cyanocobalamin (VITAMIN B 12 PO) Take by mouth.     diphenhydrAMINE (BENADRYL) 25 mg capsule Take 25 mg by mouth every 6 (six) hours as needed for allergies (for sinus pressure.).     diphenhydramine-acetaminophen (TYLENOL PM) 25-500 MG TABS tablet Take 1 tablet by mouth at bedtime as needed (takes for assistance sleeping.).     docusate sodium (COLACE) 100 MG capsule Take 100 mg by mouth 2 (two) times daily.     magnesium gluconate (MAGONATE) 500 MG tablet Take 500 mg by mouth 2 (two) times daily.  Multiple Vitamin (MULTIVITAMIN) tablet Take 1 tablet by mouth daily.     rosuvastatin (CRESTOR) 40 MG tablet Take 1 tablet (40 mg total) by mouth daily. 90 tablet 3   triprolidine-pseudoephedrine (APRODINE) 2.5-60 MG TABS tablet Take 2 tablets by mouth every 6 (six) hours as needed for allergies.     No current facility-administered medications for this visit.    Allergies as of 04/11/2023 - Review Complete 03/21/2023  Allergen Reaction Noted   Contrast media [iodinated contrast media] Hives and Itching 12/12/2016    Family History  Problem Relation Age of Onset   COPD Mother    Diabetes  Mellitus II Mother    CAD Mother        Angina   Colon cancer Father    Colon cancer Sister        at age 75   Thyroid disease Sister    Diabetes Mellitus II Sister    Colon cancer Brother        at age 68   Lung cancer Brother    GER disease Son     Social History   Socioeconomic History   Marital status: Widowed    Spouse name: Not on file   Number of children: 1   Years of education: Not on file   Highest education level: 12th grade  Occupational History   Not on file  Tobacco Use   Smoking status: Former    Current packs/day: 0.00    Average packs/day: 1 pack/day for 15.0 years (15.0 ttl pk-yrs)    Types: Cigarettes    Start date: 32    Quit date: 63    Years since quitting: 27.1   Smokeless tobacco: Never   Tobacco comments:    1 pack/week  Vaping Use   Vaping status: Never Used  Substance and Sexual Activity   Alcohol use: No   Drug use: No   Sexual activity: Not Currently  Other Topics Concern   Not on file  Social History Narrative   Not on file   Social Drivers of Health   Financial Resource Strain: Low Risk  (12/12/2022)   Overall Financial Resource Strain (CARDIA)    Difficulty of Paying Living Expenses: Not hard at all  Food Insecurity: No Food Insecurity (12/12/2022)   Hunger Vital Sign    Worried About Running Out of Food in the Last Year: Never true    Ran Out of Food in the Last Year: Never true  Transportation Needs: No Transportation Needs (12/12/2022)   PRAPARE - Administrator, Civil Service (Medical): No    Lack of Transportation (Non-Medical): No  Physical Activity: Unknown (12/12/2022)   Exercise Vital Sign    Days of Exercise per Week: 0 days    Minutes of Exercise per Session: Not on file  Stress: No Stress Concern Present (12/12/2022)   [employer] of Occupational Health - Occupational Stress Questionnaire    Feeling of Stress : Only a little  Social Connections: Moderately Isolated (12/12/2022)   Social  Connection and Isolation Panel [NHANES]    Frequency of Communication with Friends and Family: More than three times a week    Frequency of Social Gatherings with Friends and Family: More than three times a week    Attends Religious Services: More than 4 times per year    Active Member of [employer] or Organizations: No    Attends Banker Meetings: Not on file    Marital Status: Widowed  Intimate  Partner Violence: Not on file    Review of Systems: Gen: Denies any fever, chills, fatigue, weight loss, lack of appetite.  CV: Denies chest pain, heart palpitations, peripheral edema, syncope.  Resp: Denies shortness of breath at rest or with exertion. Denies wheezing or cough.  GI: Denies dysphagia or odynophagia. Denies jaundice, hematemesis, fecal incontinence. GU : Denies urinary burning, urinary frequency, urinary hesitancy MS: Denies joint pain, muscle weakness, cramps, or limitation of movement.  Derm: Denies rash, itching, dry skin Psych: Denies depression, anxiety, memory loss, and confusion Heme: Denies bruising, bleeding, and enlarged lymph nodes.  Physical Exam: There were no vitals taken for this visit. General:   Alert and oriented. Pleasant and cooperative. Well-nourished and well-developed.  Head:  Normocephalic and atraumatic. Eyes:  Without icterus, sclera clear and conjunctiva pink.  Ears:  Normal auditory acuity. Lungs:  Clear to auscultation bilaterally. No wheezes, rales, or rhonchi. No distress.  Heart:  S1, S2 present without murmurs appreciated.  Abdomen:  +BS, soft, non-tender and non-distended. No HSM noted. No guarding or rebound. No masses appreciated.  Rectal:  Deferred  Msk:  Symmetrical without gross deformities. Normal posture. Extremities:  Without edema. Neurologic:  Alert and  oriented x4;  grossly normal neurologically. Skin:  Intact without significant lesions or rashes. Psych:  Alert and cooperative. Normal mood and affect.    Assessment:      Plan:  ***   Ermalinda Memos, PA-C [facility] 04/11/2023

## 2023-04-11 ENCOUNTER — Ambulatory Visit: Payer: Medicare Other | Admitting: Gastroenterology

## 2023-04-18 ENCOUNTER — Other Ambulatory Visit: Payer: Self-pay | Admitting: *Deleted

## 2023-04-18 ENCOUNTER — Encounter: Payer: Self-pay | Admitting: Gastroenterology

## 2023-04-18 ENCOUNTER — Encounter: Payer: Self-pay | Admitting: *Deleted

## 2023-04-18 ENCOUNTER — Ambulatory Visit: Payer: Medicare Other | Admitting: Gastroenterology

## 2023-04-18 VITALS — BP 163/99 | HR 74 | Temp 98.4°F | Ht 68.0 in | Wt 206.4 lb

## 2023-04-18 DIAGNOSIS — Z800 Family history of malignant neoplasm of digestive organs: Secondary | ICD-10-CM | POA: Diagnosis not present

## 2023-04-18 DIAGNOSIS — Z860100 Personal history of colon polyps, unspecified: Secondary | ICD-10-CM

## 2023-04-18 DIAGNOSIS — R1011 Right upper quadrant pain: Secondary | ICD-10-CM | POA: Diagnosis not present

## 2023-04-18 DIAGNOSIS — Z860101 Personal history of adenomatous and serrated colon polyps: Secondary | ICD-10-CM

## 2023-04-18 DIAGNOSIS — K5900 Constipation, unspecified: Secondary | ICD-10-CM

## 2023-04-18 MED ORDER — PEG 3350-KCL-NA BICARB-NACL 420 G PO SOLR: 4000.0000 mL | Freq: Once | ORAL | 0 refills | Status: AC

## 2023-04-18 NOTE — Progress Notes (Signed)
 GI Office Note    Referring Provider: Wylene Men* Primary Care Physician:  Rica Records, FNP  Primary Gastroenterologist: formerly Dr. Darrick Penna  Chief Complaint   Chief Complaint  Patient presents with   New Patient (Initial Visit)    Pt referred for colonoscopy due to constipation     History of Present Illness   Katrina [name] is a 73 y.o. female presenting today to schedule surveillance colonoscopy due to history adenomatous colon polyps.   Takes stool softeners every other day generally works well to maintain regular bowel movements. Sometimes takes a laxative if needed. She has had constipation for years and manages well. Denies hard stools, straining, brbpr, melena. Has some pain at the site of her right nephrectomy in the RUQ area and with right anterior lower ribs. Likely due to surgery per urology. Occasional heartburn, takes TUMS.   Has referral to nephrology for CKD.   Colonoscopy 03/2018 -one 4mm polyp at splenic flexure, tubular adenoma -diverticulosis -external and internal hemorrhoids -tortous left colon -next colonoscopy in 5 years  Medications   Current Outpatient Medications  Medication Sig Dispense Refill   ascorbic acid (VITAMIN C) 500 MG tablet Take 500 mg by mouth daily.     chlorpheniramine (CHLOR-TRIMETON) 4 MG tablet Take 4 mg by mouth daily.     cholecalciferol (VITAMIN D3) 25 MCG (1000 UNIT) tablet Take 1,000 Units by mouth daily.     Cyanocobalamin (VITAMIN B 12 PO) Take by mouth.     diphenhydramine-acetaminophen (TYLENOL PM) 25-500 MG TABS tablet Take 1 tablet by mouth at bedtime as needed (takes for assistance sleeping.).     Docusate Calcium (STOOL SOFTENER PO) Take by mouth.     magnesium gluconate (MAGONATE) 500 MG tablet Take 500 mg by mouth 2 (two) times daily.     Multiple Vitamin (MULTIVITAMIN) tablet Take 1 tablet by mouth daily.     No current facility-administered medications for this visit.     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   Paresthesia of lower extremity 11/2016   bilateral   Pulmonary nodules 11/2016   Renal cancer, right (HCC) dx'd 2019   Right renal mass 11/2016    Past Surgical History   Past Surgical History:  Procedure Laterality Date   ABDOMINAL HYSTERECTOMY     BREAST CYST EXCISION Left    age 42's   COLONOSCOPY N/A 11/02/2013   Procedure: COLONOSCOPY;  Surgeon: West Bali, MD;  [facility]: [facility];  Service: Endoscopy;  Laterality: N/A;  1:15 - moved to 845 - Kim notified pt   COLONOSCOPY N/A 04/17/2018   Procedure: COLONOSCOPY;  Surgeon: West Bali, MD;  [facility]: [facility];  Service: Endoscopy;  Laterality: N/A;  12:00   IR FLUORO GUIDE PORT INSERTION RIGHT  12/30/2016   IR REMOVAL TUN ACCESS W/ PORT W/O FL MOD SED  07/04/2017   IR US GUIDE VASC ACCESS RIGHT  12/30/2016   LAMINECTOMY N/A 12/18/2016   Procedure: DECOMPRESSION T5-8/THORACIC;  Surgeon: Donalee Citrin, MD;  Location: [facility];  Service: Neurosurgery;  Laterality: N/A;  Decompressive Thoracic Laminectomy, Thoracic five - six, Thoraic six - seven, Thoracic seven - eight   POLYPECTOMY  04/17/2018   Procedure: POLYPECTOMY;  Surgeon: West Bali, MD;  [facility]: [facility];  Service: Endoscopy;;  splenic flexure   ROBOT ASSISTED LAPAROSCOPIC NEPHRECTOMY Right 06/11/2018   Procedure: XI ROBOTIC ASSISTED LAPAROSCOPIC NEPHRECTOMY;  Surgeon: Malen Gauze, MD;  Location: [facility];  Service: Urology;  Laterality: Right;  2.5 HRS   SHOULDER SURGERY     right    Past  Family History   Family History  Problem Relation Age of Onset   COPD Mother    Diabetes Mellitus II Mother    CAD Mother        Angina   Colon cancer Father    Colon cancer Sister        at age 85   Thyroid disease Sister    Breast cancer Sister    Diabetes Mellitus II Sister    Cirrhosis Sister        NASH   Colon cancer Brother        at age 35   Lung cancer Brother    GER disease Son     Past Social History   Social History   Socioeconomic History   Marital status: Widowed    Spouse name: Not on file   Number of children: 1   Years of education: Not on file   Highest education level: 12th grade  Occupational History   Not on file  Tobacco Use   Smoking status: Former    Current packs/day: 0.00    Average packs/day: 1 pack/day for 15.0 years (15.0 ttl pk-yrs)    Types: Cigarettes    Start date: 75    Quit date: 28    Years since quitting: 27.1   Smokeless tobacco: Never   Tobacco comments:    1 pack/week  Vaping Use   Vaping status: Never Used  Substance and Sexual Activity   Alcohol use: No   Drug use: No   Sexual activity: Not Currently  Other Topics Concern   Not on file  Social History Narrative   Not on file   Social Drivers of Health   Financial Resource Strain: Low Risk  (12/12/2022)   Overall Financial Resource Strain (CARDIA)    Difficulty of Paying Living Expenses: Not hard at all  Food Insecurity: No Food Insecurity (12/12/2022)   Hunger Vital Sign    Worried About Running Out of Food in the Last Year: Never true    Ran Out of Food in the Last Year: Never true  Transportation Needs: No Transportation Needs (12/12/2022)   PRAPARE - Administrator, Civil Service (Medical): No    Lack of Transportation (Non-Medical): No  Physical Activity: Unknown (12/12/2022)   Exercise Vital Sign    Days of Exercise per Week: 0 days    Minutes of Exercise per Session: Not on file  Stress: No Stress Concern Present (12/12/2022)    [employer] of Occupational Health - Occupational Stress Questionnaire    Feeling of Stress : Only a little  Social Connections: Moderately Isolated (12/12/2022)   Social Connection and Isolation Panel [NHANES]    Frequency of Communication with Friends and Family: More than three times a week    Frequency of Social Gatherings with Friends and Family: More than three times a week  Attends Religious Services: More than 4 times per year    Active Member of Clubs or Organizations: No    Attends Banker Meetings: Not on file    Marital Status: Widowed  Intimate Partner Violence: Not on file    Review of Systems   General: Negative for anorexia, weight loss, fever, chills, fatigue, weakness. Eyes: Negative for vision changes.  ENT: Negative for hoarseness, difficulty swallowing , nasal congestion. CV: Negative for chest pain, angina, palpitations, dyspnea on exertion, peripheral edema.  Respiratory: Negative for dyspnea at rest, dyspnea on exertion, cough, sputum, wheezing.  GI: See history of present illness. GU:  Negative for dysuria, hematuria, urinary incontinence, urinary frequency, nocturnal urination.  MS: Negative for joint pain, low back pain.  Derm: Negative for rash or itching.  Neuro: Negative for weakness, abnormal sensation, seizure, frequent headaches, memory loss,  confusion.  Psych: Negative for anxiety, depression, suicidal ideation, hallucinations.  Endo: Negative for unusual weight change.  Heme: Negative for bruising or bleeding. Allergy: Negative for rash or hives.  Physical Exam   BP (!) 163/99   Pulse 74   Temp 98.4 F (36.9 C)   Ht 5\' 8"  (1.727 m)   Wt 206 lb 6.4 oz (93.6 kg)   BMI 31.38 kg/m    General: Well-nourished, well-developed in no acute distress.  Head: Normocephalic, atraumatic.   Eyes: Conjunctiva pink, no icterus. Mouth: Oropharyngeal mucosa moist and pink   Neck: Supple without thyromegaly, masses, or  lymphadenopathy.  Lungs: Clear to auscultation bilaterally.  Heart: Regular rate and rhythm, no murmurs rubs or gallops.  Abdomen: Bowel sounds are normal,  nondistended, no hepatosplenomegaly or masses,  no abdominal bruits or hernia, no rebound or guarding.   Rectal: not performed Extremities: No lower extremity edema. No clubbing or deformities.  Neuro: Alert and oriented x 4 , grossly normal neurologically.  Skin: Warm and dry, no rash or jaundice.   Psych: Alert and cooperative, normal mood and affect.  Labs   Lab Results  Component Value Date   NA 141 03/12/2023   CL 101 03/12/2023   K 4.0 03/12/2023   CO2 22 03/12/2023   BUN 26 03/12/2023   CREATININE 1.45 (H) 03/12/2023   EGFR 39 (L) 03/12/2023   CALCIUM 9.8 03/12/2023   ALBUMIN 4.5 03/12/2023   GLUCOSE 93 03/12/2023   Lab Results  Component Value Date   ALT 14 03/12/2023   AST 20 03/12/2023   ALKPHOS 73 03/12/2023   BILITOT 0.3 03/12/2023   Lab Results  Component Value Date   WBC 6.1 12/18/2022   HGB 13.4 12/18/2022   HCT 41.7 12/18/2022   MCV 89 12/18/2022   PLT 259 12/18/2022   Lab Results  Component Value Date   IRON 87 12/18/2022   TIBC 297 12/18/2022   FERRITIN 224 (H) 12/18/2022   Lab Results  Component Value Date   VITAMINB12 1,155 12/18/2022   Lab Results  Component Value Date   HGBA1C 5.5 12/18/2022   Lab Results  Component Value Date   TSH 1.980 12/18/2022    Imaging Studies   No results found.  Assessment/Plan   H/O adenomatous colon polyps/FH of colon cancer in three first degree relatives: -due for surveillance colonoscopy. ASA 3.  I have discussed the risks, alternatives, benefits with regards to but not limited to the risk of reaction to medication, bleeding, infection, perforation and the patient is agreeable to proceed. Written consent to be obtained. -vague pain in RUQ at site of  nephrectomy, has been advised likely related to her prior surgery. Present since her surgery  in 2020. Suspect abdominal wall pain. CT 2021 with no findings to explain pain.   Leanna Battles. Melvyn Neth, MHS, PA-C [facility]

## 2023-04-18 NOTE — Patient Instructions (Signed)
 Colonoscopy to be scheduled. See separate instructions.  ?

## 2023-04-21 ENCOUNTER — Encounter: Payer: Self-pay | Admitting: *Deleted

## 2023-04-29 ENCOUNTER — Ambulatory Visit (INDEPENDENT_AMBULATORY_CARE_PROVIDER_SITE_OTHER): Payer: Medicare Other

## 2023-04-29 VITALS — BP 123/79 | Ht 68.0 in | Wt 200.0 lb

## 2023-04-29 DIAGNOSIS — Z0000 Encounter for general adult medical examination without abnormal findings: Secondary | ICD-10-CM

## 2023-04-29 NOTE — Progress Notes (Signed)
 Because this visit was a virtual/telehealth visit,  certain criteria was not obtained, such a blood pressure, CBG if applicable, and timed get up and go. Any medications not marked as "taking" were not mentioned during the medication reconciliation part of the visit. Any vitals not documented were not able to be obtained due to this being a telehealth visit or patient was unable to self-report a recent blood pressure reading due to a lack of equipment at home via telehealth. Vitals that have been documented are verbally provided by the patient.   Subjective:   Katrina [name] is a 73 y.o. who presents for a Medicare Wellness preventive visit.  Visit Complete: Virtual I connected with  Katrina [name] on 04/29/23 by a audio enabled telemedicine application and verified that I am speaking with the correct person using two identifiers.  Patient Location: Home  Provider Location: Home Office  I discussed the limitations of evaluation and management by telemedicine. The patient expressed understanding and agreed to proceed.  Vital Signs: Because this visit was a virtual/telehealth visit, some criteria may be missing or patient reported. Any vitals not documented were not able to be obtained and vitals that have been documented are patient reported.  VideoDeclined- This patient declined Librarian, academic. Therefore the visit was completed with audio only.  AWV Questionnaire: No: Patient Medicare AWV questionnaire was not completed prior to this visit.  Cardiac Risk Factors include: advanced age (>26men, >98 women);hypertension;obesity (BMI >30kg/m2)     Objective:    Today's Vitals   04/29/23 1047  BP: 123/79  Weight: 200 lb (90.7 kg)  Height: 5\' 8"  (1.727 m)   Body mass index is 30.41 kg/m.     04/29/2023   10:56 AM 06/11/2018    6:24 PM 06/09/2018    9:26 AM 04/22/2018    9:06 AM 04/17/2018   10:23 AM 07/04/2017   12:08 PM 05/19/2017    9:00 AM  Advanced  Directives  Does Patient Have a Medical Advance Directive? No No No No No No No  Would patient like information on creating a medical advance directive? No - Patient declined No - Patient declined No - Patient declined No - Patient declined No - Patient declined No - Patient declined No - Patient declined    Current Medications (verified) Outpatient Encounter Medications as of 04/29/2023  Medication Sig   ascorbic acid (VITAMIN C) 500 MG tablet Take 500 mg by mouth daily.   chlorpheniramine (CHLOR-TRIMETON) 4 MG tablet Take 4 mg by mouth daily.   cholecalciferol (VITAMIN D3) 25 MCG (1000 UNIT) tablet Take 1,000 Units by mouth daily.   Cyanocobalamin (VITAMIN B 12 PO) Take by mouth.   diphenhydramine-acetaminophen (TYLENOL PM) 25-500 MG TABS tablet Take 1 tablet by mouth at bedtime as needed (takes for assistance sleeping.).   Docusate Calcium (STOOL SOFTENER PO) Take by mouth.   magnesium gluconate (MAGONATE) 500 MG tablet Take 500 mg by mouth 2 (two) times daily.   Multiple Vitamin (MULTIVITAMIN) tablet Take 1 tablet by mouth daily.   No facility-administered encounter medications on file as of 04/29/2023.    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  Paresthesia of lower extremity 11/2016   bilateral   Pulmonary nodules 11/2016   Renal cancer, right (HCC) dx'd 2019   Right renal mass 11/2016   Past Surgical History:  Procedure Laterality Date   ABDOMINAL HYSTERECTOMY     BREAST CYST EXCISION Left    age 68's   COLONOSCOPY N/A 11/02/2013   Procedure: COLONOSCOPY;  Surgeon: West Bali, MD;  [facility];  Service: Endoscopy;  Laterality: N/A;  1:15 - moved to 845 - Kim notified pt    COLONOSCOPY N/A 04/17/2018   Procedure: COLONOSCOPY;  Surgeon: West Bali, MD;  [facility];  Service: Endoscopy;  Laterality: N/A;  12:00   IR FLUORO GUIDE PORT INSERTION RIGHT  12/30/2016   IR REMOVAL TUN ACCESS W/ PORT W/O FL MOD SED  07/04/2017   IR US GUIDE VASC ACCESS RIGHT  12/30/2016   LAMINECTOMY N/A 12/18/2016   Procedure: DECOMPRESSION T5-8/THORACIC;  Surgeon: Donalee Citrin, MD;  Location[facility] OR;  Service: Neurosurgery;  Laterality: N/A;  Decompressive Thoracic Laminectomy, Thoracic five - six, Thoraic six - seven, Thoracic seven - eight   POLYPECTOMY  04/17/2018   Procedure: POLYPECTOMY;  Surgeon: West Bali, MD;  [facility];  Service: Endoscopy;;  splenic flexure   ROBOT ASSISTED LAPAROSCOPIC NEPHRECTOMY Right 06/11/2018   Procedure: XI ROBOTIC ASSISTED LAPAROSCOPIC NEPHRECTOMY;  Surgeon: Malen Gauze, MD;  Location: [facility];  Service: Urology;  Laterality: Right;  2.5 HRS   SHOULDER SURGERY     right   Family History  Problem Relation Age of Onset   COPD Mother    Diabetes Mellitus II Mother    CAD Mother        Angina   Colon cancer Father    Colon cancer Sister        at age 91   Thyroid disease Sister    Breast cancer Sister    Diabetes Mellitus II Sister    Cirrhosis Sister        NASH   Colon cancer Brother        at age 72   Lung cancer Brother    GER disease Son    Social History   Socioeconomic History   Marital status: Widowed    Spouse name: Not on file   Number of children: 1   Years of education: Not on file   Highest education level: 12th grade  Occupational History   Not on file  Tobacco Use   Smoking status: Former    Current packs/day: 0.00    Average packs/day: 1 pack/day for 15.0 years (15.0 ttl pk-yrs)    Types: Cigarettes    Start date: 11    Quit date: 46    Years since quitting: 27.2   Smokeless tobacco: Never   Tobacco comments:    1 pack/week  Vaping Use   Vaping status: Never Used   Substance and Sexual Activity   Alcohol use: No   Drug use: No   Sexual activity: Not Currently  Other Topics Concern   Not on file  Social History Narrative   Not on file   Social Drivers of Health   Financial Resource Strain: Low Risk  (04/29/2023)   Overall Financial Resource Strain (CARDIA)    Difficulty of Paying Living Expenses: Not hard at all  Food Insecurity: No Food Insecurity (04/29/2023)   Hunger Vital Sign    Worried About Running Out of Food in the Last Year: Never  true    Ran Out of Food in the Last Year: Never true  Transportation Needs: No Transportation Needs (04/29/2023)   PRAPARE - Administrator, Civil Service (Medical): No    Lack of Transportation (Non-Medical): No  Physical Activity: Insufficiently Active (04/29/2023)   Exercise Vital Sign    Days of Exercise per Week: 2 days    Minutes of Exercise per Session: 20 min  Stress: No Stress Concern Present (04/29/2023)   [employer] of Occupational Health - Occupational Stress Questionnaire    Feeling of Stress : Only a little  Social Connections: Moderately Isolated (04/29/2023)   Social Connection and Isolation Panel [NHANES]    Frequency of Communication with Friends and Family: More than three times a week    Frequency of Social Gatherings with Friends and Family: More than three times a week    Attends Religious Services: More than 4 times per year    Active Member of [employer] or Organizations: No    Attends Banker Meetings: Never    Marital Status: Widowed    Tobacco Counseling Counseling given: Yes Tobacco comments: 1 pack/week    Clinical Intake:  Pre-visit preparation completed: Yes  Pain : No/denies pain     BMI - recorded: 30.41 Nutritional Status: BMI > 30  Obese Nutritional Risks: None Diabetes: No  How often do you need to have someone help you when you read instructions, pamphlets, or other written materials from your doctor or pharmacy?: 1 -  Never  Interpreter Needed?: No  Information entered by :: Maryjean Ka CMA   Activities of Daily Living     04/29/2023   10:56 AM  In your present state of health, do you have any difficulty performing the following activities:  Hearing? 0  Vision? 0  Difficulty concentrating or making decisions? 0  Walking or climbing stairs? 0  Dressing or bathing? 0  Doing errands, shopping? 0  Preparing Food and eating ? N  Using the Toilet? N  In the past six months, have you accidently leaked urine? N  Do you have problems with loss of bowel control? N  Managing your Medications? N  Managing your Finances? N  Housekeeping or managing your Housekeeping? N    Patient Care Team: Del Newman Nip, Tenna Child, FNP as PCP - General (Family Medicine) McKenzie, Mardene Celeste, MD as Consulting Physician (Urology)  Indicate any recent Medical Services you may have received from other than [facility] providers in the past year (date may be approximate).     Assessment:   This is a routine wellness examination for Katrina [name].  Hearing/Vision screen Hearing Screening - Comments:: Patient denies any hearing difficulties.   Vision Screening - Comments:: Wears rx glasses - up to date with routine eye exams  Patient sees ophthalmologist in [facility]    Goals Addressed             This Visit's Progress    Patient Stated       I'd like to lose some more weight and eat healthier       Depression Screen     04/29/2023   10:58 AM 12/16/2022   10:56 AM 01/29/2017    2:40 PM 01/29/2017    2:28 PM  PHQ 2/9 Scores  PHQ - 2 Score 0 0 2 2  PHQ- 9 Score  8 8     Fall Risk     04/29/2023   10:56 AM 01/29/2017    2:28 PM  Fall Risk   Falls in the past year? 0 Yes  Number falls in past yr: 0 1  Injury with Fall? 0 No  Risk for fall due to : No Fall Risks Impaired balance/gait  Follow up Falls prevention discussed;Education provided     MEDICARE RISK AT HOME:  Medicare Risk at Home Any stairs in  or around the home?: Yes If so, are there any without handrails?: No Home free of loose throw rugs in walkways, pet beds, electrical cords, etc?: Yes Adequate lighting in your home to reduce risk of falls?: Yes Life alert?: No Use of a cane, walker or w/c?: No Grab bars in the bathroom?: Yes Shower chair or bench in shower?: Yes (pt has a shower chair but doesnt have to use it right now) Elevated toilet seat or a handicapped toilet?: Yes  TIMED UP AND GO:  Was the test performed?  No  Cognitive Function: 6CIT completed        04/29/2023   10:49 AM  6CIT Screen  What Year? 0 points  What month? 0 points  What time? 0 points  Count back from 20 0 points  Months in reverse 0 points  Repeat phrase 0 points  Total Score 0 points    Immunizations Immunization History  Administered Date(s) Administered   Influenza,inj,Quad PF,6+ Mos 12/13/2016   Pneumococcal Polysaccharide-23 12/13/2016    Screening Tests Health Maintenance  Topic Date Due   COVID-19 Vaccine (1) Never done   DTaP/Tdap/Td (1 - Tdap) Never done   Zoster Vaccines- Shingrix (1 of 2) Never done   Pneumonia Vaccine 65+ Years old (2 of 2 - PCV) 12/13/2017   INFLUENZA VACCINE  09/19/2022   Colonoscopy  04/18/2023   MAMMOGRAM  03/06/2024   Medicare Annual Wellness (AWV)  04/28/2024   DEXA SCAN  12/19/2024   Hepatitis C Screening  Completed   HPV VACCINES  Aged Out    Health Maintenance  Health Maintenance Due  Topic Date Due   COVID-19 Vaccine (1) Never done   DTaP/Tdap/Td (1 - Tdap) Never done   Zoster Vaccines- Shingrix (1 of 2) Never done   Pneumonia Vaccine 20+ Years old (2 of 2 - PCV) 12/13/2017   INFLUENZA VACCINE  09/19/2022   Colonoscopy  04/18/2023   Health Maintenance Items Addressed: Patient scheduled for colonoscopy on 05/19/2023 with Dr. Marletta Lor  Additional Screening:  Vision Screening: Recommended annual ophthalmology exams for early detection of glaucoma and other disorders of the  eye.  Dental Screening: Recommended annual dental exams for proper oral hygiene  Community Resource Referral / Chronic Care Management: CRR required this visit?  No   CCM required this visit?  No     Plan:     I have personally reviewed and noted the following in the patient's chart:   Medical and social history Use of alcohol, tobacco or illicit drugs  Current medications and supplements including opioid prescriptions. Patient is not currently taking opioid prescriptions. Functional ability and status Nutritional status Physical activity Advanced directives List of other physicians Hospitalizations, surgeries, and ER visits in previous 12 months Vitals Screenings to include cognitive, depression, and falls Referrals and appointments  In addition, I have reviewed and discussed with patient certain preventive protocols, quality metrics, and best practice recommendations. A written personalized care plan for preventive services as well as general preventive health recommendations were provided to patient.     Jordan Hawks Rayne Cowdrey, CMA   04/29/2023   After Visit Summary: (MyChart) Due to  this being a telephonic visit, the after visit summary with patients personalized plan was offered to patient via MyChart   Notes: Nothing significant to report at this time.

## 2023-04-29 NOTE — Patient Instructions (Signed)
 Katrina [name] , Thank you for taking time to come for your Medicare Wellness Visit. I appreciate your ongoing commitment to your health goals. Please review the following plan we discussed and let me know if I can assist you in the future.   Referrals/Orders/Follow-Ups/Clinician Recommendations:  Next Medicare Annual Wellness Visit:   May 03, 2024 at 10:00 am video visit  This is a list of the screening recommended for you and due dates:  Health Maintenance  Topic Date Due   COVID-19 Vaccine (1) Never done   DTaP/Tdap/Td vaccine (1 - Tdap) Never done   Zoster (Shingles) Vaccine (1 of 2) Never done   Pneumonia Vaccine (2 of 2 - PCV) 12/13/2017   Flu Shot  09/19/2022   Colon Cancer Screening  04/18/2023   Mammogram  03/06/2024   Medicare Annual Wellness Visit  04/28/2024   DEXA scan (bone density measurement)  12/19/2024   Hepatitis C Screening  Completed   HPV Vaccine  Aged Out    Advanced directives: (ACP Link)Information on Advanced Care Planning can be found at [facility] [place] Advance Health Care Directives Advance Health Care Directives. [email or web address]   Next Medicare Annual Wellness Visit scheduled for next year: yes  Understanding Your Risk for Falls Millions of people have serious injuries from falls each year. It is important to understand your risk of falling. Talk with your health care provider about your risk and what you can do to lower it. If you do have a serious fall, make sure to tell your provider. Falling once raises your risk of falling again. How can falls affect me? Serious injuries from falls are common. These include: Broken bones, such as hip fractures. Head injuries, such as traumatic brain injuries (TBI) or concussions. A fear of falling can cause you to avoid activities and stay at home. This can make your muscles weaker and raise your risk for a fall. What can increase my risk? There are a number of risk factors that increase your risk for  falling. The more risk factors you have, the higher your risk of falling. Serious injuries from a fall happen most often to people who are older than 72 years old. Teenagers and young adults ages 77-29 are also at higher risk. Common risk factors include: Weakness in the lower body. Being generally weak or confused due to long-term (chronic) illness. Dizziness or balance problems. Poor vision. Medicines that cause dizziness or drowsiness. These may include: Medicines for your blood pressure, heart, anxiety, insomnia, or swelling (edema). Pain medicines. Muscle relaxants. Other risk factors include: Drinking alcohol. Having had a fall in the past. Having foot pain or wearing improper footwear. Working at a dangerous job. Having any of the following in your home: Tripping hazards, such as floor clutter or loose rugs. Poor lighting. Pets. Having dementia or memory loss. What actions can I take to lower my risk of falling?     Physical activity Stay physically fit. Do strength and balance exercises. Consider taking a regular class to build strength and balance. Yoga and tai chi are good options. Vision Have your eyes checked every year and your prescription for glasses or contacts updated as needed. Shoes and walking aids Wear non-skid shoes. Wear shoes that have rubber soles and low heels. Do not wear high heels. Do not walk around the house in socks or slippers. Use a cane or walker as told by your provider. Home safety Attach secure railings on both sides of your stairs. Install grab  bars for your bathtub, shower, and toilet. Use a non-skid mat in your bathtub or shower. Attach bath mats securely with double-sided, non-slip rug tape. Use good lighting in all rooms. Keep a flashlight near your bed. Make sure there is a clear path from your bed to the bathroom. Use night-lights. Do not use throw rugs. Make sure all carpeting is taped or tacked down securely. Remove all clutter  from walkways and stairways, including extension cords. Repair uneven or broken steps and floors. Avoid walking on icy or slippery surfaces. Walk on the grass instead of on icy or slick sidewalks. Use ice melter to get rid of ice on walkways in the winter. Use a cordless phone. Questions to ask your health care provider Can you help me check my risk for a fall? Do any of my medicines make me more likely to fall? Should I take a vitamin D supplement? What exercises can I do to improve my strength and balance? Should I make an appointment to have my vision checked? Do I need a bone density test to check for weak bones (osteoporosis)? Would it help to use a cane or a walker? Where to find more information Centers for Disease Control and Prevention, STEADI: TonerPromos.no Community-Based Fall Prevention Programs: TonerPromos.no [employer] on Aging: BaseRingTones.pl Contact a health care provider if: You fall at home. You are afraid of falling at home. You feel weak, drowsy, or dizzy. This information is not intended to replace advice given to you by your health care provider. Make sure you discuss any questions you have with your health care provider. Document Revised: 10/08/2021 Document Reviewed: 10/08/2021 Elsevier Patient Education  2024 [employer].

## 2023-05-12 NOTE — Patient Instructions (Signed)
 Katrina [name]  05/12/2023     @PREFPERIOPPHARMACY @   Your procedure is scheduled on  05/19/2023.   Report to [facility] at  0700 A.M.   Call this number if you have problems the morning of surgery:  [phone]  If you experience any cold or flu symptoms such as cough, fever, chills, shortness of breath, etc. between now and your scheduled surgery, please notify us at the above number.   Remember:  Follow the diet and prep instructions given to you by the office.   You may drink clear liquids until 0500 am on 05/19/2023.    Clear liquids allowed are:                    Water, Juice (No red color; non-citric and without pulp; diabetics please choose diet or no sugar options), Carbonated beverages (diabetics please choose diet or no sugar options), Clear Tea (No creamer, milk, or cream, including half & half and powdered creamer), Black Coffee Only (No creamer, milk or cream, including half & half and powdered creamer), and Clear Sports drink (No red color; diabetics please choose diet or no sugar options)    Take these medicines the morning of surgery with A SIP OF WATER                                                    None.    Do not wear jewelry, make-up or nail polish, including gel polish,  artificial nails, or any other type of covering on natural nails (fingers and  toes).  Do not wear lotions, powders, or perfumes, or deodorant.  Do not shave 48 hours prior to surgery.  Men may shave face and neck.  Do not bring valuables to the hospital.  [facility] is not responsible for any belongings or valuables.  Contacts, dentures or bridgework may not be worn into surgery.  Leave your suitcase in the car.  After surgery it may be brought to your room.  For patients admitted to the hospital, discharge time will be determined by your treatment team.  Patients discharged the day of surgery will not be allowed to drive home and must have someone with them for 24 hours.     Special instructions:   DO NOT smoke tobacco or vape for 24 hours before your procedure.  Please read over the following fact sheets that you were given. Anesthesia Post-op Instructions and Care and Recovery After Surgery      Colonoscopy, Adult, Care After The following information offers guidance on how to care for yourself after your procedure. Your health care provider may also give you more specific instructions. If you have problems or questions, contact your health care provider. What can I expect after the procedure? After the procedure, it is common to have: A small amount of blood in your stool for 24 hours after the procedure. Some gas. Mild cramping or bloating of your abdomen. Follow these instructions at home: Eating and drinking  Drink enough fluid to keep your urine pale yellow. Follow instructions from your health care provider about eating or drinking restrictions. Resume your normal diet as told by your health care provider. Avoid heavy or fried foods that are hard to digest. Activity Rest as told by your health care provider. Avoid  sitting for a long time without moving. Get up to take short walks every 1-2 hours. This is important to improve blood flow and breathing. Ask for help if you feel weak or unsteady. Return to your normal activities as told by your health care provider. Ask your health care provider what activities are safe for you. Managing cramping and bloating  Try walking around when you have cramps or feel bloated. If directed, apply heat to your abdomen as told by your health care provider. Use the heat source that your health care provider recommends, such as a moist heat pack or a heating pad. Place a towel between your skin and the heat source. Leave the heat on for 20-30 minutes. Remove the heat if your skin turns bright red. This is especially important if you are unable to feel pain, heat, or cold. You have a greater risk of getting  burned. General instructions If you were given a sedative during the procedure, it can affect you for several hours. Do not drive or operate machinery until your health care provider says that it is safe. For the first 24 hours after the procedure: Do not sign important documents. Do not drink alcohol. Do your regular daily activities at a slower pace than normal. Eat soft foods that are easy to digest. Take over-the-counter and prescription medicines only as told by your health care provider. Keep all follow-up visits. This is important. Contact a health care provider if: You have blood in your stool 2-3 days after the procedure. Get help right away if: You have more than a small spotting of blood in your stool. You have large blood clots in your stool. You have swelling of your abdomen. You have nausea or vomiting. You have a fever. You have increasing pain in your abdomen that is not relieved with medicine. These symptoms may be an emergency. Get help right away. Call 911. Do not wait to see if the symptoms will go away. Do not drive yourself to the hospital. Summary After the procedure, it is common to have a small amount of blood in your stool. You may also have mild cramping and bloating of your abdomen. If you were given a sedative during the procedure, it can affect you for several hours. Do not drive or operate machinery until your health care provider says that it is safe. Get help right away if you have a lot of blood in your stool, nausea or vomiting, a fever, or increased pain in your abdomen. This information is not intended to replace advice given to you by your health care provider. Make sure you discuss any questions you have with your health care provider. Document Revised: 03/19/2022 Document Reviewed: 09/27/2020 Elsevier Patient Education  2024 Elsevier Inc.General Anesthesia, Adult, Care After The following information offers guidance on how to care for yourself  after your procedure. Your health care provider may also give you more specific instructions. If you have problems or questions, contact your health care provider. What can I expect after the procedure? After the procedure, it is common for people to: Have pain or discomfort at the IV site. Have nausea or vomiting. Have a sore throat or hoarseness. Have trouble concentrating. Feel cold or chills. Feel weak, sleepy, or tired (fatigue). Have soreness and body aches. These can affect parts of the body that were not involved in surgery. Follow these instructions at home: For the time period you were told by your health care provider:  Rest. Do not participate  in activities where you could fall or become injured. Do not drive or use machinery. Do not drink alcohol. Do not take sleeping pills or medicines that cause drowsiness. Do not make important decisions or sign legal documents. Do not take care of children on your own. General instructions Drink enough fluid to keep your urine pale yellow. If you have sleep apnea, surgery and certain medicines can increase your risk for breathing problems. Follow instructions from your health care provider about wearing your sleep device: Anytime you are sleeping, including during daytime naps. While taking prescription pain medicines, sleeping medicines, or medicines that make you drowsy. Return to your normal activities as told by your health care provider. Ask your health care provider what activities are safe for you. Take over-the-counter and prescription medicines only as told by your health care provider. Do not use any products that contain nicotine or tobacco. These products include cigarettes, chewing tobacco, and vaping devices, such as e-cigarettes. These can delay incision healing after surgery. If you need help quitting, ask your health care provider. Contact a health care provider if: You have nausea or vomiting that does not get better  with medicine. You vomit every time you eat or drink. You have pain that does not get better with medicine. You cannot urinate or have bloody urine. You develop a skin rash. You have a fever. Get help right away if: You have trouble breathing. You have chest pain. You vomit blood. These symptoms may be an emergency. Get help right away. Call 911. Do not wait to see if the symptoms will go away. Do not drive yourself to the hospital. Summary After the procedure, it is common to have a sore throat, hoarseness, nausea, vomiting, or to feel weak, sleepy, or fatigue. For the time period you were told by your health care provider, do not drive or use machinery. Get help right away if you have difficulty breathing, have chest pain, or vomit blood. These symptoms may be an emergency. This information is not intended to replace advice given to you by your health care provider. Make sure you discuss any questions you have with your health care provider. Document Revised: 05/04/2021 Document Reviewed: 05/04/2021 Elsevier Patient Education  2024 [employer].

## 2023-05-14 ENCOUNTER — Encounter (HOSPITAL_COMMUNITY)
Admission: RE | Admit: 2023-05-14 | Discharge: 2023-05-14 | Disposition: A | Discharge status: Home / Self Care | Source: Ambulatory Visit | Attending: Internal Medicine | Admitting: Internal Medicine

## 2023-05-14 ENCOUNTER — Encounter (HOSPITAL_COMMUNITY): Payer: Self-pay

## 2023-05-14 VITALS — BP 158/92 | HR 98 | Temp 97.6°F | Resp 18

## 2023-05-14 DIAGNOSIS — I129 Hypertensive chronic kidney disease with stage 1 through stage 4 chronic kidney disease, or unspecified chronic kidney disease: Secondary | ICD-10-CM | POA: Insufficient documentation

## 2023-05-14 DIAGNOSIS — D631 Anemia in chronic kidney disease: Secondary | ICD-10-CM | POA: Diagnosis not present

## 2023-05-14 DIAGNOSIS — N183 Chronic kidney disease, stage 3 unspecified: Secondary | ICD-10-CM | POA: Diagnosis not present

## 2023-05-14 DIAGNOSIS — I452 Bifascicular block: Secondary | ICD-10-CM | POA: Insufficient documentation

## 2023-05-14 DIAGNOSIS — Z01818 Encounter for other preprocedural examination: Secondary | ICD-10-CM | POA: Insufficient documentation

## 2023-05-14 DIAGNOSIS — I10 Essential (primary) hypertension: Secondary | ICD-10-CM

## 2023-05-14 LAB — CBC WITH DIFFERENTIAL/PLATELET
Abs Immature Granulocytes: 0.02 10*3/uL (ref 0.00–0.07)
Basophils Absolute: 0 10*3/uL (ref 0.0–0.1)
Basophils Relative: 0 %
Eosinophils Absolute: 0.1 10*3/uL (ref 0.0–0.5)
Eosinophils Relative: 1 %
HCT: 41.2 % (ref 36.0–46.0)
Hemoglobin: 13.7 g/dL (ref 12.0–15.0)
Immature Granulocytes: 0 %
Lymphocytes Relative: 25 %
Lymphs Abs: 1.8 10*3/uL (ref 0.7–4.0)
MCH: 29.2 pg (ref 26.0–34.0)
MCHC: 33.3 g/dL (ref 30.0–36.0)
MCV: 87.8 fL (ref 80.0–100.0)
Monocytes Absolute: 0.6 10*3/uL (ref 0.1–1.0)
Monocytes Relative: 8 %
Neutro Abs: 4.8 10*3/uL (ref 1.7–7.7)
Neutrophils Relative %: 66 %
Platelets: 237 10*3/uL (ref 150–400)
RBC: 4.69 MIL/uL (ref 3.87–5.11)
RDW: 13.6 % (ref 11.5–15.5)
WBC: 7.3 10*3/uL (ref 4.0–10.5)
nRBC: 0 % (ref 0.0–0.2)

## 2023-05-14 LAB — BASIC METABOLIC PANEL
Anion gap: 12 (ref 5–15)
BUN: 24 mg/dL — ABNORMAL HIGH (ref 8–23)
CO2: 21 mmol/L — ABNORMAL LOW (ref 22–32)
Calcium: 9.5 mg/dL (ref 8.9–10.3)
Chloride: 104 mmol/L (ref 98–111)
Creatinine, Ser: 1.41 mg/dL — ABNORMAL HIGH (ref 0.44–1.00)
GFR, Estimated: 40 mL/min — ABNORMAL LOW (ref >60)
Glucose, Bld: 95 mg/dL (ref 70–99)
Potassium: 3.9 mmol/L (ref 3.5–5.1)
Sodium: 137 mmol/L (ref 135–145)

## 2023-05-15 ENCOUNTER — Other Ambulatory Visit (HOSPITAL_COMMUNITY): Payer: Medicare Other

## 2023-05-19 ENCOUNTER — Encounter (HOSPITAL_COMMUNITY): Payer: Self-pay | Admitting: Internal Medicine

## 2023-05-19 ENCOUNTER — Ambulatory Visit (HOSPITAL_COMMUNITY): Admitting: Anesthesiology

## 2023-05-19 ENCOUNTER — Ambulatory Visit (HOSPITAL_COMMUNITY)
Admission: RE | Admit: 2023-05-19 | Discharge: 2023-05-19 | Disposition: A | Discharge status: Home / Self Care | Payer: Medicare Other | Attending: Internal Medicine | Admitting: Internal Medicine

## 2023-05-19 ENCOUNTER — Encounter (HOSPITAL_COMMUNITY)
Admission: RE | Disposition: A | Discharge status: Home / Self Care | Payer: Self-pay | Source: Home / Self Care | Attending: Internal Medicine

## 2023-05-19 DIAGNOSIS — I10 Essential (primary) hypertension: Secondary | ICD-10-CM | POA: Diagnosis not present

## 2023-05-19 DIAGNOSIS — K648 Other hemorrhoids: Secondary | ICD-10-CM | POA: Diagnosis not present

## 2023-05-19 DIAGNOSIS — D122 Benign neoplasm of ascending colon: Secondary | ICD-10-CM | POA: Diagnosis not present

## 2023-05-19 DIAGNOSIS — K5730 Diverticulosis of large intestine without perforation or abscess without bleeding: Secondary | ICD-10-CM

## 2023-05-19 DIAGNOSIS — Z1211 Encounter for screening for malignant neoplasm of colon: Secondary | ICD-10-CM

## 2023-05-19 DIAGNOSIS — I129 Hypertensive chronic kidney disease with stage 1 through stage 4 chronic kidney disease, or unspecified chronic kidney disease: Secondary | ICD-10-CM | POA: Insufficient documentation

## 2023-05-19 DIAGNOSIS — Z87891 Personal history of nicotine dependence: Secondary | ICD-10-CM | POA: Diagnosis not present

## 2023-05-19 DIAGNOSIS — Z85528 Personal history of other malignant neoplasm of kidney: Secondary | ICD-10-CM | POA: Insufficient documentation

## 2023-05-19 DIAGNOSIS — G839 Paralytic syndrome, unspecified: Secondary | ICD-10-CM | POA: Diagnosis not present

## 2023-05-19 DIAGNOSIS — N183 Chronic kidney disease, stage 3 unspecified: Secondary | ICD-10-CM | POA: Insufficient documentation

## 2023-05-19 DIAGNOSIS — D175 Benign lipomatous neoplasm of intra-abdominal organs: Secondary | ICD-10-CM

## 2023-05-19 DIAGNOSIS — K6389 Other specified diseases of intestine: Secondary | ICD-10-CM | POA: Diagnosis not present

## 2023-05-19 DIAGNOSIS — Z800 Family history of malignant neoplasm of digestive organs: Secondary | ICD-10-CM | POA: Insufficient documentation

## 2023-05-19 DIAGNOSIS — Z860101 Personal history of adenomatous and serrated colon polyps: Secondary | ICD-10-CM

## 2023-05-19 DIAGNOSIS — D123 Benign neoplasm of transverse colon: Secondary | ICD-10-CM | POA: Insufficient documentation

## 2023-05-19 DIAGNOSIS — Z8572 Personal history of non-Hodgkin lymphomas: Secondary | ICD-10-CM | POA: Insufficient documentation

## 2023-05-19 HISTORY — PX: COLONOSCOPY WITH PROPOFOL: SHX5780

## 2023-05-19 HISTORY — PX: POLYPECTOMY: SHX5525

## 2023-05-19 SURGERY — COLONOSCOPY WITH PROPOFOL
Anesthesia: General

## 2023-05-19 MED ORDER — PROPOFOL 10 MG/ML IV BOLUS
INTRAVENOUS | Status: DC | PRN
  Administered 2023-05-19: 60 mg via INTRAVENOUS
  Administered 2023-05-19: 150 ug/kg/min via INTRAVENOUS

## 2023-05-19 MED ORDER — LACTATED RINGERS IV SOLN
INTRAVENOUS | Status: DC | PRN
Start: 2023-05-19 — End: 2023-05-19

## 2023-05-19 MED ORDER — GLYCOPYRROLATE PF 0.2 MG/ML IJ SOSY
PREFILLED_SYRINGE | INTRAMUSCULAR | Status: DC | PRN
  Administered 2023-05-19: .2 mg via INTRAVENOUS

## 2023-05-19 NOTE — Anesthesia Postprocedure Evaluation (Signed)
 Anesthesia Post Note  Patient: Katrina [name]  Procedure(s) Performed: COLONOSCOPY WITH PROPOFOL POLYPECTOMY  Patient location during evaluation: PACU Anesthesia Type: General Level of consciousness: awake and alert Pain management: pain level controlled Vital Signs Assessment: post-procedure vital signs reviewed and stable Respiratory status: spontaneous breathing, nonlabored ventilation, respiratory function stable and patient connected to nasal cannula oxygen Cardiovascular status: blood pressure returned to baseline and stable Postop Assessment: no apparent nausea or vomiting Anesthetic complications: no   There were no known notable events for this encounter.   Last Vitals:  Vitals:   05/19/23 0801 05/19/23 0938  BP: (!) 160/90 133/85  Pulse:  88  Resp: 15 (!) 24  Temp: 36.6 C 36.5 C  SpO2: 100% 100%    Last Pain:  Vitals:   05/19/23 0938  PainSc: 0-No pain                 Ishanvi Mcquitty L Vinh Sachs

## 2023-05-19 NOTE — Anesthesia Preprocedure Evaluation (Signed)
 Anesthesia Evaluation  Patient identified by MRN, date of birth, ID band Patient awake    Reviewed: Allergy & Precautions, H&P , NPO status , Patient's Chart, lab work & pertinent test results, reviewed documented beta blocker date and time   Airway Mallampati: II  TM Distance: >3 FB Neck ROM: full    Dental no notable dental hx. (+) Dental Advisory Given, Teeth Intact   Pulmonary neg pulmonary ROS, former smoker   Pulmonary exam normal breath sounds clear to auscultation       Cardiovascular Exercise Tolerance: Good hypertension, Normal cardiovascular exam Rhythm:regular Rate:Normal     Neuro/Psych Paralysis negative neurological ROS  negative psych ROS   GI/Hepatic negative GI ROS, Neg liver ROS,,,  Endo/Other  negative endocrine ROS    Renal/GU Renal diseaseMalignant Renal mass.  Stage 3 CKD  negative genitourinary   Musculoskeletal  (+) Arthritis , Osteoarthritis,    Abdominal   Peds  Hematology negative hematology ROS (+)   Anesthesia Other Findings Non Hodgkins lymphoma  Reproductive/Obstetrics negative OB ROS                             Anesthesia Physical Anesthesia Plan  ASA: 3  Anesthesia Plan: General   Post-op Pain Management: Minimal or no pain anticipated   Induction: Intravenous  PONV Risk Score and Plan: Propofol infusion  Airway Management Planned: Natural Airway and Nasal Cannula  Additional Equipment: None  Intra-op Plan:   Post-operative Plan:   Informed Consent: I have reviewed the patients History and Physical, chart, labs and discussed the procedure including the risks, benefits and alternatives for the proposed anesthesia with the patient or authorized representative who has indicated his/her understanding and acceptance.     Dental Advisory Given  Plan Discussed with: CRNA  Anesthesia Plan Comments:        Anesthesia Quick Evaluation

## 2023-05-19 NOTE — Discharge Instructions (Addendum)
?  Colonoscopy ?Discharge Instructions ? ?Read the instructions outlined below and refer to this sheet in the next few weeks. These discharge instructions provide you with general information on caring for yourself after you leave the hospital. Your doctor may also give you specific instructions. While your treatment has been planned according to the most current medical practices available, unavoidable complications occasionally occur.  ? ?ACTIVITY ?You may resume your regular activity, but move at a slower pace for the next 24 hours.  ?Take frequent rest periods for the next 24 hours.  ?Walking will help get rid of the air and reduce the bloated feeling in your belly (abdomen).  ?No driving for 24 hours (because of the medicine (anesthesia) used during the test).   ?Do not sign any important legal documents or operate any machinery for 24 hours (because of the anesthesia used during the test).  ?NUTRITION ?Drink plenty of fluids.  ?You may resume your normal diet as instructed by your doctor.  ?Begin with a light meal and progress to your normal diet. Heavy or fried foods are harder to digest and may make you feel sick to your stomach (nauseated).  ?Avoid alcoholic beverages for 24 hours or as instructed.  ?MEDICATIONS ?You may resume your normal medications unless your doctor tells you otherwise.  ?WHAT YOU CAN EXPECT TODAY ?Some feelings of bloating in the abdomen.  ?Passage of more gas than usual.  ?Spotting of blood in your stool or on the toilet paper.  ?IF YOU HAD POLYPS REMOVED DURING THE COLONOSCOPY: ?No aspirin products for 7 days or as instructed.  ?No alcohol for 7 days or as instructed.  ?Eat a soft diet for the next 24 hours.  ?FINDING OUT THE RESULTS OF YOUR TEST ?Not all test results are available during your visit. If your test results are not back during the visit, make an appointment with your caregiver to find out the results. Do not assume everything is normal if you have not heard from your  caregiver or the medical facility. It is important for you to follow up on all of your test results.  ?SEEK IMMEDIATE MEDICAL ATTENTION IF: ?You have more than a spotting of blood in your stool.  ?Your belly is swollen (abdominal distention).  ?You are nauseated or vomiting.  ?You have a temperature over 101.  ?You have abdominal pain or discomfort that is severe or gets worse throughout the day.  ? ?Your colonoscopy revealed 3 polyp(s) which I removed successfully. Await pathology results, my office will contact you. I recommend repeating colonoscopy in 5 years for surveillance purposes. You also have diverticulosis and internal hemorrhoids. I would recommend increasing fiber in your diet or adding OTC Benefiber/Metamucil. Be sure to drink at least 4 to 6 glasses of water daily. Follow-up with GI as needed. ? ? ? ?I hope you have a great rest of your week! ? ?Charles K. Carver, D.O. ?Gastroenterology and Hepatology ?[facility] ? ?

## 2023-05-19 NOTE — Op Note (Signed)
 [facility] Patient Name: Katrina [name] Procedure Date: 05/19/2023 9:04 AM MRN: [id number] Date of Birth: 09/30/1950 Attending MD: Hennie Duos. Maple Mirza, [id number] CSN: [id number] Age: 73 Admit Type: Outpatient Procedure:                Colonoscopy Indications:              Colon cancer screening in patient at increased                            risk: Colorectal cancer in father, Colon cancer                            screening in patient at increased risk: Colorectal                            cancer in brother, Colon cancer screening in                            patient at increased risk: Colorectal cancer in                            sister, Surveillance: Personal history of                            adenomatous polyps on last colonoscopy 5 years ago Providers:                Hennie Duos. Marletta Lor, DO, Tammy Vaught, RN, Dyann Ruddle Referring MD:              Medicines:                See the Anesthesia note for documentation of the                            administered medications Complications:            No immediate complications. Estimated Blood Loss:     Estimated blood loss was minimal. Procedure:                Pre-Anesthesia Assessment:                           - The anesthesia plan was to use monitored                            anesthesia care (MAC).                           After obtaining informed consent, the colonoscope                            was passed under direct vision. Throughout the                            procedure, the patient's blood  pressure, pulse, and                            oxygen saturations were monitored continuously. The                            [id number]-HQ190L ([id number]) scope was introduced through                            the anus and advanced to the the cecum, identified                            by appendiceal orifice and ileocecal valve. The                            colonoscopy was performed  without difficulty. The                            patient tolerated the procedure well. The quality                            of the bowel preparation was evaluated using the                            BBPS [facility] Bowel Preparation Scale) with scores                            of: Right Colon = 3, Transverse Colon = 3 and Left                            Colon = 3 (entire mucosa seen well with no residual                            staining, small fragments of stool or opaque                            liquid). The total BBPS score equals 9. Scope In: 9:15:51 AM Scope Out: 9:34:01 AM Scope Withdrawal Time: 0 hours 15 minutes 2 seconds  Total Procedure Duration: 0 hours 18 minutes 10 seconds  Findings:      Non-bleeding internal hemorrhoids were found during endoscopy.      Multiple large-mouthed and small-mouthed diverticula were found in the       sigmoid colon and descending colon.      A localized area of mildly nodular mucosa was found in the cecum.       Biopsies were taken with a cold forceps for histology.      Three sessile polyps were found in the transverse colon and ascending       colon. The polyps were 4 to 6 mm in size. These polyps were removed with       a cold snare. Resection and retrieval were complete.      There was a small lipoma, at the hepatic flexure. Impression:               - Non-bleeding internal  hemorrhoids.                           - Diverticulosis in the sigmoid colon and in the                            descending colon.                           - Nodular mucosa in the cecum. Biopsied.                           - Three 4 to 6 mm polyps in the transverse colon                            and in the ascending colon, removed with a cold                            snare. Resected and retrieved.                           - Small lipoma at the hepatic flexure. Moderate Sedation:      Per Anesthesia Care Recommendation:           - Patient has a contact  number available for                            emergencies. The signs and symptoms of potential                            delayed complications were discussed with the                            patient. Return to normal activities tomorrow.                            Written discharge instructions were provided to the                            patient.                           - Resume previous diet.                           - Continue present medications.                           - Await pathology results.                           - Repeat colonoscopy in 5 years for surveillance                            and family hsitory of colon cancer in brother,  sister, and father.                           - Return to GI clinic PRN. Procedure Code(s):        --- Professional ---                           [phone], Colonoscopy, flexible; with removal of                            tumor(s), polyp(s), or other lesion(s) by snare                            technique                           45380, 59, Colonoscopy, flexible; with biopsy,                            single or multiple Diagnosis Code(s):        --- Professional ---                           Z80.0, Family history of malignant neoplasm of                            digestive organs                           Z86.010, Personal history of colonic polyps                           K64.8, Other hemorrhoids                           K63.89, Other specified diseases of intestine                           D12.3, Benign neoplasm of transverse colon (hepatic                            flexure or splenic flexure)                           D12.2, Benign neoplasm of ascending colon                           D17.5, Benign lipomatous neoplasm of                            intra-abdominal organs                           K57.30, Diverticulosis of large intestine without                            perforation or abscess  without bleeding CPT copyright 2022 American Medical Association. All rights reserved.  The codes documented in this report are preliminary and upon coder review may  be revised to meet current compliance requirements. Hennie Duos. Marletta Lor, DO Hennie Duos. Marletta Lor, DO 05/19/2023 9:38:12 AM This report has been signed electronically. Number of Addenda: 0

## 2023-05-19 NOTE — Transfer of Care (Signed)
 Immediate Anesthesia Transfer of Care Note  Patient: Katrina [name]  Procedure(s) Performed: COLONOSCOPY WITH PROPOFOL POLYPECTOMY  Patient Location: Endoscopy Unit  Anesthesia Type:General  Level of Consciousness: awake, alert , and oriented  Airway & Oxygen Therapy: Patient Spontanous Breathing  Post-op Assessment: Report given to RN and Post -op Vital signs reviewed and stable  Post vital signs: Reviewed and stable  Last Vitals:  Vitals Value Taken Time  BP    Temp    Pulse    Resp    SpO2      Last Pain:  Vitals:   05/19/23 0911  PainSc: 0-No pain         Complications: No notable events documented.

## 2023-05-19 NOTE — H&P (Signed)
 Primary Care Physician:  Rica Records, FNP Primary Gastroenterologist:  Dr. Marletta Lor  Pre-Procedure History & Physical: HPI:  Katrina [name] is a 73 y.o. female is here  for a colonoscopy to be performed for high risk colon cancer screening purposes, family history of colon cancer in brother and sister and personal history of adenomatous colon polyps in 2020.   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   Paresthesia of lower extremity 11/2016   bilateral   Pulmonary nodules 11/2016   Renal cancer, right (HCC) dx'd 2019   Right renal mass 11/2016    Past Surgical History:  Procedure Laterality Date   ABDOMINAL HYSTERECTOMY     BREAST CYST EXCISION Left    age 82's   COLONOSCOPY N/A 11/02/2013   Procedure: COLONOSCOPY;  Surgeon: West Bali, MD;  Location: [facility]ITE;  Service: Endoscopy;  Laterality: N/A;  1:15 - moved to 845 - Kim notified pt   COLONOSCOPY N/A 04/17/2018   Procedure: COLONOSCOPY;  Surgeon: West Bali, MD;  Location: [facility];  Service: Endoscopy;  Laterality: N/A;  12:00   IR FLUORO GUIDE PORT INSERTION RIGHT  12/30/2016   IR REMOVAL TUN ACCESS W/ PORT W/O FL MOD SED  07/04/2017   IR US GUIDE VASC ACCESS RIGHT  12/30/2016   LAMINECTOMY N/A 12/18/2016   Procedure: DECOMPRESSION T5-8/THORACIC;  Surgeon: Donalee Citrin, MD;  Location: [facility] OR;  Service: Neurosurgery;  Laterality: N/A;  Decompressive Thoracic Laminectomy, Thoracic five - six, Thoraic six - seven, Thoracic seven - eight   POLYPECTOMY  04/17/2018   Procedure: POLYPECTOMY;  Surgeon: West Bali, MD;  Location: [facility];  Service: Endoscopy;;  splenic flexure   ROBOT ASSISTED LAPAROSCOPIC NEPHRECTOMY Right 06/11/2018   Procedure: XI ROBOTIC ASSISTED LAPAROSCOPIC  NEPHRECTOMY;  Surgeon: Malen Gauze, MD;  Location: [facility] ORS;  Service: Urology;  Laterality: Right;  2.5 HRS   SHOULDER SURGERY     right    Prior to Admission medications   Medication Sig Start Date End Date Taking? Authorizing Provider  chlorpheniramine (CHLOR-TRIMETON) 4 MG tablet Take 4 mg by mouth daily.   Yes [provider]  ascorbic acid (VITAMIN C) 500 MG tablet Take 500 mg by mouth daily.    [provider]  cholecalciferol (VITAMIN D3) 25 MCG (1000 UNIT) tablet Take 1,000 Units by mouth daily.    [provider]  Cyanocobalamin (VITAMIN B 12 PO) Take by mouth.    [provider]  diphenhydramine-acetaminophen (TYLENOL PM) 25-500 MG TABS tablet Take 1 tablet by mouth at bedtime as needed (takes for assistance sleeping.).    [provider]  Docusate Calcium (STOOL SOFTENER PO) Take by mouth.    [provider]  magnesium gluconate (MAGONATE) 500 MG tablet Take 500 mg by mouth 2 (two) times daily.    [provider]  Multiple Vitamin (MULTIVITAMIN) tablet Take 1 tablet by mouth daily.    [provider]    Allergies as of 04/18/2023 - Review Complete 04/18/2023  Allergen Reaction Noted   Contrast media [iodinated contrast media] Hives and Itching 12/12/2016    Family History  Problem Relation Age of Onset   COPD Mother  Diabetes Mellitus II Mother    CAD Mother        Angina   Colon cancer Father    Colon cancer Sister        at age 21   Thyroid disease Sister    Breast cancer Sister    Diabetes Mellitus II Sister    Cirrhosis Sister        NASH   Colon cancer Brother        at age 60   Lung cancer Brother    GER disease Son     Social History   Socioeconomic History   Marital status: Widowed    Spouse name: Not on file   Number of children: 1   Years of education: Not on file   Highest education level: 12th grade  Occupational History   Not on file  Tobacco Use   Smoking  status: Former    Current packs/day: 0.00    Average packs/day: 1 pack/day for 15.0 years (15.0 ttl pk-yrs)    Types: Cigarettes    Start date: 55    Quit date: 39    Years since quitting: 27.2   Smokeless tobacco: Never   Tobacco comments:    1 pack/week  Vaping Use   Vaping status: Never Used  Substance and Sexual Activity   Alcohol use: No   Drug use: No   Sexual activity: Not Currently  Other Topics Concern   Not on file  Social History Narrative   Not on file   Social Drivers of Health   Financial Resource Strain: Low Risk  (04/29/2023)   Overall Financial Resource Strain (CARDIA)    Difficulty of Paying Living Expenses: Not hard at all  Food Insecurity: No Food Insecurity (04/29/2023)   Hunger Vital Sign    Worried About Running Out of Food in the Last Year: Never true    Ran Out of Food in the Last Year: Never true  Transportation Needs: No Transportation Needs (04/29/2023)   PRAPARE - Administrator, Civil Service (Medical): No    Lack of Transportation (Non-Medical): No  Physical Activity: Insufficiently Active (04/29/2023)   Exercise Vital Sign    Days of Exercise per Week: 2 days    Minutes of Exercise per Session: 20 min  Stress: No Stress Concern Present (04/29/2023)   [employer] of Occupational Health - Occupational Stress Questionnaire    Feeling of Stress : Only a little  Social Connections: Moderately Isolated (04/29/2023)   Social Connection and Isolation Panel [NHANES]    Frequency of Communication with Friends and Family: More than three times a week    Frequency of Social Gatherings with Friends and Family: More than three times a week    Attends Religious Services: More than 4 times per year    Active Member of [employer] or Organizations: No    Attends Banker Meetings: Never    Marital Status: Widowed  Intimate Partner Violence: Not At Risk (04/29/2023)   Humiliation, Afraid, Rape, and Kick questionnaire    Fear of  Current or Ex-Partner: No    Emotionally Abused: No    Physically Abused: No    Sexually Abused: No    Review of Systems: See HPI, otherwise negative ROS  Physical Exam: Vital signs in last 24 hours: Temp:  [97.8 F (36.6 C)] 97.8 F (36.6 C) (03/31 0801) Resp:  [15] 15 (03/31 0801) BP: (160)/(90) 160/90 (03/31 0801) SpO2:  [100 %] 100 % (03/31  0801) Weight:  [90.7 kg] 90.7 kg (03/31 0754)   General:   Alert,  Well-developed, well-nourished, pleasant and cooperative in NAD Head:  Normocephalic and atraumatic. Eyes:  Sclera clear, no icterus.   Conjunctiva pink. Ears:  Normal auditory acuity. Nose:  No deformity, discharge,  or lesions. Mouth:  No deformity or lesions, dentition normal. Neck:  Supple; no masses or thyromegaly. Lungs:  Clear throughout to auscultation.   No wheezes, crackles, or rhonchi. No acute distress. Heart:  Regular rate and rhythm; no murmurs, clicks, rubs,  or gallops. Abdomen:  Soft, nontender and nondistended. No masses, hepatosplenomegaly or hernias noted. Normal bowel sounds, without guarding, and without rebound.   Msk:  Symmetrical without gross deformities. Normal posture. Extremities:  Without clubbing or edema. Neurologic:  Alert and  oriented x4;  grossly normal neurologically. Skin:  Intact without significant lesions or rashes. Cervical Nodes:  No significant cervical adenopathy. Psych:  Alert and cooperative. Normal mood and affect.  Impression/Plan: Katrina [name] is here for a colonoscopy to be performed for high risk colon cancer screening purposes, family history of colon cancer in brother and sister and personal history of adenomatous colon polyps in 2020.   The risks of the procedure including infection, bleed, or perforation as well as benefits, limitations, alternatives and imponderables have been reviewed with the patient. Questions have been answered. All parties agreeable.

## 2023-05-20 ENCOUNTER — Encounter (HOSPITAL_COMMUNITY): Payer: Self-pay | Admitting: Internal Medicine

## 2023-05-20 LAB — SURGICAL PATHOLOGY

## 2023-09-09 LAB — LAB REPORT - SCANNED

## 2023-10-31 ENCOUNTER — Ambulatory Visit (INDEPENDENT_AMBULATORY_CARE_PROVIDER_SITE_OTHER): Payer: Self-pay | Admitting: Family Medicine

## 2023-10-31 VITALS — BP 177/91 | HR 75 | Temp 98.2°F | Resp 16 | Ht 68.0 in | Wt 202.0 lb

## 2023-10-31 DIAGNOSIS — J3489 Other specified disorders of nose and nasal sinuses: Secondary | ICD-10-CM | POA: Diagnosis not present

## 2023-10-31 DIAGNOSIS — R591 Generalized enlarged lymph nodes: Secondary | ICD-10-CM

## 2023-10-31 NOTE — Assessment & Plan Note (Signed)
 Encouraged to seek care immediately if you have: -Severe or persistent nosebleeds -Increasing pain or swelling -Signs of infection (fever, pus) -Changes in nose shape Referral: ENT

## 2023-10-31 NOTE — Patient Instructions (Addendum)
 I appreciate the opportunity to provide care to you today!    Labs: please stop by the lab today to get your blood drawn (CBC with differential, CMP, ESR/CRP to evaluate for infection, inflammation, or hematologic abnormalities.  -Please stop by [facility]  for a CT scan of the neck evaluate for lymphadenopathy and systemic involvement. -May continue using warm compresses for tenderness, but avoid masking symptoms.  Supportive care: hydration, adequate rest, and monitor for systemic symptoms.  Monitoring & Red Flags: -Persistent or enlarging lumps -Night sweats, unexplained fever, or chills -Unexplained weight loss -Fatigue, pruritus, or new pain/swelling   Nasal septal perforation Seek care immediately if you have: -Severe or persistent nosebleeds -Increasing pain or swelling -Signs of infection (fever, pus) -Changes in nose shape  Referral: ENT   Please continue to a heart-healthy diet and increase your physical activities. Try to exercise for at least five days a week.    It was a pleasure to see you and I look forward to continuing to work together on your health and well-being. Please do not hesitate to call the office if you need care or have questions about your care.  In case of emergency, please visit the Emergency Department for urgent care, or contact our clinic at [phone] to schedule an appointment. We're here to help you!   Have a wonderful day and week. With Gratitude, Belva Koziel MSN, FNP-BC

## 2023-10-31 NOTE — Assessment & Plan Note (Signed)
 Encouraged to follow up with oncology -Encouraged to please stop by [facility]  for a CT scan of the neck evaluate for lymphadenopathy and systemic involvement. -May continue using warm compresses for tenderness, but avoid masking symptoms.  Encouraged supportive care: hydration, adequate rest, and monitor for systemic symptoms.  Discussed Monitoring & Red Flags: -Persistent or enlarging lumps -Night sweats, unexplained fever, or chills -Unexplained weight loss -Fatigue, pruritus, or new pain/swelling

## 2023-10-31 NOTE — Progress Notes (Signed)
 Acute Office Visit  Subjective:    Patient ID: Katrina [name], female    DOB: 06-28-1950, 73 y.o.   MRN: [id number]  Chief Complaint  Patient presents with   Mass    Has been having tenderness in her neck glands and had a lump come up under her chin in July. Sometimes she has tenderness in the glands under her arm and in her groin as well. She used some heat on it and it went down but with her history of cancer, she was concerned    Nose Problem    States she has a hole in the cartilage of her nose and wanted to make you aware     HPI The patient presents today with intermittent symptoms since July. She denies fever, unintentional weight loss, and night sweats. She reports a past medical history significant for Hodgkin's lymphoma, diagnosed in October 2018, for which she completed 6-8 months of chemotherapy. She also reports noticing a hole in the cartilage of her nose approximately 3 months ago. She denies pain, frequent nosebleeds, or nasal congestion associated with this finding.     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   Paresthesia of lower extremity 11/2016   bilateral   Pulmonary nodules 11/2016   Renal cancer, right (HCC) dx'd 2019   Right renal mass 11/2016    Past Surgical History:  Procedure Laterality Date   ABDOMINAL HYSTERECTOMY     BREAST CYST EXCISION Left    age 74's   COLONOSCOPY N/A 11/02/2013   Procedure: COLONOSCOPY;  Surgeon: Margo LITTIE Haddock, MD;  [facility];  Service: Endoscopy;  Laterality: N/A;  1:15 - moved to 845 - Kim notified pt   COLONOSCOPY N/A 04/17/2018   Procedure: COLONOSCOPY;  Surgeon: Haddock Margo LITTIE, MD;  [facility];  Service: Endoscopy;  Laterality: N/A;  12:00   COLONOSCOPY WITH PROPOFOL  N/A  05/19/2023   Procedure: COLONOSCOPY WITH PROPOFOL ;  Surgeon: Cindie Carlin POUR, DO;  [facility];  Service: Endoscopy;  Laterality: N/A;  9:00 am, asa 3   IR FLUORO GUIDE PORT INSERTION RIGHT  12/30/2016   IR REMOVAL TUN ACCESS W/ PORT W/O FL MOD SED  07/04/2017   IR US  GUIDE VASC ACCESS RIGHT  12/30/2016   LAMINECTOMY N/A 12/18/2016   Procedure: DECOMPRESSION T5-8/THORACIC;  Surgeon: Onetha Kuba, MD;  Location: [facility] OR;  Service: Neurosurgery;  Laterality: N/A;  Decompressive Thoracic Laminectomy, Thoracic five - six, Thoraic six - seven, Thoracic seven - eight   POLYPECTOMY  04/17/2018   Procedure: POLYPECTOMY;  Surgeon: Haddock Margo LITTIE, MD;  [facility];  Service: Endoscopy;;  splenic flexure   POLYPECTOMY  05/19/2023   Procedure: POLYPECTOMY;  Surgeon: Cindie Carlin POUR, DO;  [facility];  Service: Endoscopy;;   ROBOT ASSISTED LAPAROSCOPIC NEPHRECTOMY Right 06/11/2018   Procedure: XI ROBOTIC ASSISTED LAPAROSCOPIC NEPHRECTOMY;  Surgeon: Sherrilee Belvie LITTIE, MD;  Location: [facility];  Service: Urology;  Laterality: Right;  2.5 HRS   SHOULDER SURGERY     right    Family History  Problem Relation Age of Onset   COPD Mother    Diabetes Mellitus II Mother    CAD Mother  Angina   Colon cancer Father    Colon cancer Sister        at age 79   Thyroid disease Sister    Breast cancer Sister    Diabetes Mellitus II Sister    Cirrhosis Sister        NASH   Colon cancer Brother        at age 59   Lung cancer Brother    GER disease Son     Social History   Socioeconomic History   Marital status: Widowed    Spouse name: Not on file   Number of children: 1   Years of education: Not on file   Highest education level: 12th grade  Occupational History   Not on file  Tobacco Use   Smoking status: Former    Current packs/day: 0.00    Average packs/day: 1 pack/day for 15.0 years (15.0 ttl pk-yrs)    Types: Cigarettes    Start date: 71    Quit  date: 15    Years since quitting: 27.7   Smokeless tobacco: Never   Tobacco comments:    1 pack/week  Vaping Use   Vaping status: Never Used  Substance and Sexual Activity   Alcohol use: No   Drug use: No   Sexual activity: Not Currently  Other Topics Concern   Not on file  Social History Narrative   Not on file   Social Drivers of Health   Financial Resource Strain: Low Risk  (04/29/2023)   Overall Financial Resource Strain (CARDIA)    Difficulty of Paying Living Expenses: Not hard at all  Food Insecurity: No Food Insecurity (04/29/2023)   Hunger Vital Sign    Worried About Running Out of Food in the Last Year: Never true    Ran Out of Food in the Last Year: Never true  Transportation Needs: No Transportation Needs (04/29/2023)   PRAPARE - Administrator, Civil Service (Medical): No    Lack of Transportation (Non-Medical): No  Physical Activity: Insufficiently Active (04/29/2023)   Exercise Vital Sign    Days of Exercise per Week: 2 days    Minutes of Exercise per Session: 20 min  Stress: No Stress Concern Present (04/29/2023)   [employer] of Occupational Health - Occupational Stress Questionnaire    Feeling of Stress : Only a little  Social Connections: Moderately Isolated (04/29/2023)   Social Connection and Isolation Panel    Frequency of Communication with Friends and Family: More than three times a week    Frequency of Social Gatherings with Friends and Family: More than three times a week    Attends Religious Services: More than 4 times per year    Active Member of [employer] or Organizations: No    Attends Banker Meetings: Never    Marital Status: Widowed  Intimate Partner Violence: Not At Risk (04/29/2023)   Humiliation, Afraid, Rape, and Kick questionnaire    Fear of Current or Ex-Partner: No    Emotionally Abused: No    Physically Abused: No    Sexually Abused: No    Outpatient Medications Prior to Visit  Medication Sig Dispense  Refill   ascorbic acid (VITAMIN C) 500 MG tablet Take 500 mg by mouth daily.     chlorpheniramine (CHLOR-TRIMETON) 4 MG tablet Take 4 mg by mouth daily.     cholecalciferol (VITAMIN D3) 25 MCG (1000 UNIT) tablet Take 1,000 Units by mouth daily.     Cyanocobalamin (VITAMIN  B 12 PO) Take by mouth.     diphenhydramine -acetaminophen  (TYLENOL  PM) 25-500 MG TABS tablet Take 1 tablet by mouth at bedtime as needed (takes for assistance sleeping.).     Docusate Calcium  (STOOL SOFTENER PO) Take by mouth.     magnesium  gluconate (MAGONATE) 500 MG tablet Take 500 mg by mouth 2 (two) times daily.     Multiple Vitamin (MULTIVITAMIN) tablet Take 1 tablet by mouth daily.     No facility-administered medications prior to visit.    Allergies  Allergen Reactions   Contrast Media [Iodinated Contrast Media] Hives and Itching    Review of Systems  Constitutional:  Negative for chills and fever.  HENT:  Negative for congestion, postnasal drip, rhinorrhea, sinus pressure and sinus pain.   Eyes:  Negative for visual disturbance.  Respiratory:  Negative for chest tightness and shortness of breath.   Neurological:  Negative for dizziness and headaches.       Objective:    Physical Exam HENT:     Nose:     Comments: Hole in the cartilage of her nose Lymphadenopathy:     Head:     Right side of head: No submental, submandibular, tonsillar, preauricular, posterior auricular or occipital adenopathy.     Left side of head: No submental, submandibular, tonsillar, preauricular, posterior auricular or occipital adenopathy.     Cervical: No cervical adenopathy.     Right cervical: No superficial, deep or posterior cervical adenopathy.    Left cervical: No superficial, deep or posterior cervical adenopathy.     BP (!) 177/91   Pulse 75   Temp 98.2 F (36.8 C) (Oral)   Resp 16   Ht 5' 8 (1.727 m)   Wt 202 lb (91.6 kg)   SpO2 98%   BMI 30.71 kg/m  Wt Readings from Last 3 Encounters:  10/31/23 202 lb  (91.6 kg)  05/19/23 199 lb 15.3 oz (90.7 kg)  04/29/23 200 lb (90.7 kg)       Assessment & Plan:  Nasal septal perforation Assessment & Plan: Encouraged to seek care immediately if you have: -Severe or persistent nosebleeds -Increasing pain or swelling -Signs of infection (fever, pus) -Changes in nose shape Referral: ENT  Orders: -     Ambulatory referral to ENT  Lymphadenopathy Assessment & Plan: Encouraged to follow up with oncology -Encouraged to please stop by [facility]  for a CT scan of the neck evaluate for lymphadenopathy and systemic involvement. -May continue using warm compresses for tenderness, but avoid masking symptoms.  Encouraged supportive care: hydration, adequate rest, and monitor for systemic symptoms.  Discussed Monitoring & Red Flags: -Persistent or enlarging lumps -Night sweats, unexplained fever, or chills -Unexplained weight loss -Fatigue, pruritus, or new pain/swelling  Orders: -     CBC with Differential/Platelet -     Sedimentation rate -     C-reactive protein -     CT SOFT TISSUE NECK WO CONTRAST  Note: This chart has been completed using Engineer, civil (consulting) software, and while attempts have been made to ensure accuracy, certain words and phrases may not be transcribed as intended.    Assad Harbeson, FNP

## 2023-11-01 ENCOUNTER — Ambulatory Visit: Payer: Self-pay | Admitting: Family Medicine

## 2023-11-01 LAB — CBC WITH DIFFERENTIAL/PLATELET
Basophils Absolute: 0.1 x10E3/uL (ref 0.0–0.2)
Basos: 1 %
EOS (ABSOLUTE): 0.1 x10E3/uL (ref 0.0–0.4)
Eos: 1 %
Hematocrit: 42.9 % (ref 34.0–46.6)
Hemoglobin: 14 g/dL (ref 11.1–15.9)
Immature Grans (Abs): 0 x10E3/uL (ref 0.0–0.1)
Immature Granulocytes: 0 %
Lymphocytes Absolute: 2.5 x10E3/uL (ref 0.7–3.1)
Lymphs: 29 %
MCH: 28.8 pg (ref 26.6–33.0)
MCHC: 32.6 g/dL (ref 31.5–35.7)
MCV: 88 fL (ref 79–97)
Monocytes Absolute: 0.6 x10E3/uL (ref 0.1–0.9)
Monocytes: 8 %
Neutrophils Absolute: 5.2 x10E3/uL (ref 1.4–7.0)
Neutrophils: 61 %
Platelets: 297 x10E3/uL (ref 150–450)
RBC: 4.86 x10E6/uL (ref 3.77–5.28)
RDW: 13.2 % (ref 11.7–15.4)
WBC: 8.5 x10E3/uL (ref 3.4–10.8)

## 2023-11-01 LAB — C-REACTIVE PROTEIN: CRP: 6 mg/L (ref 0–10)

## 2023-11-01 LAB — SEDIMENTATION RATE: Sed Rate: 10 mm/h (ref 0–40)

## 2023-11-01 NOTE — Progress Notes (Signed)
 Please inform the patient: Your lab results did not show any evidence of infection or inflammation.

## 2023-11-05 ENCOUNTER — Encounter: Payer: Self-pay | Admitting: Nephrology

## 2023-11-07 ENCOUNTER — Telehealth: Payer: Self-pay | Admitting: Family Medicine

## 2023-11-07 NOTE — Telephone Encounter (Signed)
 Copied from CRM [id number]. Topic: Referral - Status >> Nov 07, 2023  8:48 AM Delon HERO wrote: Reason for CRM: Patient is calling to check on the status of the Otolaryngology referral?

## 2023-11-07 NOTE — Telephone Encounter (Signed)
 Referral sent to: [facility] [address], [address] - [place] [address] [phone]  Once their Office reviews the Referral, their Office will reach out to Patient for Appt Scheduling.  MyChart Message sent to Patient with Specialty Office contact information.

## 2023-11-14 ENCOUNTER — Other Ambulatory Visit: Payer: Self-pay | Admitting: Hematology and Oncology

## 2023-11-14 DIAGNOSIS — C8330 Diffuse large B-cell lymphoma, unspecified site: Secondary | ICD-10-CM

## 2023-11-17 ENCOUNTER — Encounter: Payer: Self-pay | Admitting: Hematology and Oncology

## 2023-11-17 ENCOUNTER — Inpatient Hospital Stay: Attending: Hematology and Oncology | Admitting: Hematology and Oncology

## 2023-11-17 VITALS — BP 170/93 | HR 67 | Temp 97.8°F | Resp 17 | Ht 68.0 in | Wt 201.6 lb

## 2023-11-17 DIAGNOSIS — C83398 Diffuse large b-cell lymphoma of other extranodal and solid organ sites: Secondary | ICD-10-CM | POA: Diagnosis present

## 2023-11-17 DIAGNOSIS — C641 Malignant neoplasm of right kidney, except renal pelvis: Secondary | ICD-10-CM | POA: Diagnosis not present

## 2023-11-17 DIAGNOSIS — C8330 Diffuse large B-cell lymphoma, unspecified site: Secondary | ICD-10-CM | POA: Diagnosis not present

## 2023-11-17 NOTE — Progress Notes (Signed)
 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  MRI of her spine show mild cord compression due to bone marrow infiltration and she underwent emergency decompressive thoracic laminectomy and resection of epidural tumor  The patient completed 6 cycles of chemotherapy with R-EPOCH with complete response to treatment Last imaging study was in November 2021 and she was discharged from my clinic   Her examination review very small lymphadenopathy in the submandibular region but I am not convinced that represent significant lymphadenopathy CT imaging is ordered for Wednesday I will call her with test results Her labs are normal Cancer of right kidney [facility]) The patient was discharged from the urology clinic She has no signs or symptoms to suggest cancer recurrence  No orders of the defined types were placed in this encounter.    Almarie Bedford, MD  INTERVAL HISTORY: she returns for urgent evaluation A month ago, she woke up feeling some mild discomfort and lymphadenopathy in the submandibular region She also complained of some discomfort in her armpit No fever or chills No recent changes in bowel habits, night sweats or weight loss She denies recent upper respiratory tract infection  PHYSICAL EXAMINATION: ECOG PERFORMANCE STATUS: 0 - Asymptomatic  Vitals:   11/17/23 0922  BP: (!) 170/93  Pulse: 67  Resp: 17  Temp: 97.8 F (36.6 C)  SpO2: 98%   Filed Weights   11/17/23 0922   Weight: 201 lb 9.6 oz (91.4 kg)   GENERAL:alert, no distress and comfortable SKIN: skin color, texture, turgor are normal, no rashes or significant lesions EYES: normal, conjunctiva are pink and non-injected, sclera clear OROPHARYNX:no exudate, no erythema and lips, buccal mucosa, and tongue normal  NECK: supple, thyroid normal size, non-tender, without nodularity LYMPH: There is subtle lymphadenopathy in the submandibular region but nothing unusual or enlarged on exam LUNGS: clear to auscultation and percussion with normal breathing effort HEART: regular rate & rhythm and no murmurs and no lower extremity edema ABDOMEN:abdomen soft, non-tender and normal bowel sounds Musculoskeletal:no cyanosis of digits and no clubbing  PSYCH: alert & oriented x 3 with fluent speech NEURO: no focal motor/sensory deficits  Relevant data reviewed during this visit included CBC, sed rate, CRP done early September

## 2023-11-17 NOTE — Assessment & Plan Note (Addendum)
 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  MRI of her spine show mild cord compression due to bone marrow infiltration and she underwent emergency decompressive thoracic laminectomy and resection of epidural tumor  The patient completed 6 cycles of chemotherapy with R-EPOCH with complete response to treatment Last imaging study was in November 2021 and she was discharged from my clinic   Her examination review very small lymphadenopathy in the submandibular region but I am not convinced that represent significant lymphadenopathy CT imaging is ordered for Wednesday I will call her with test results Her labs are normal

## 2023-11-17 NOTE — Assessment & Plan Note (Addendum)
 The patient was discharged from the urology clinic She has no signs or symptoms to suggest cancer recurrence

## 2023-11-19 ENCOUNTER — Ambulatory Visit (HOSPITAL_COMMUNITY)
Admission: RE | Admit: 2023-11-19 | Discharge: 2023-11-19 | Disposition: A | Discharge status: Home / Self Care | Source: Ambulatory Visit | Attending: Family Medicine | Admitting: Family Medicine

## 2023-11-19 ENCOUNTER — Encounter (INDEPENDENT_AMBULATORY_CARE_PROVIDER_SITE_OTHER): Payer: Self-pay | Admitting: Otolaryngology

## 2023-11-19 ENCOUNTER — Ambulatory Visit (INDEPENDENT_AMBULATORY_CARE_PROVIDER_SITE_OTHER): Admitting: Otolaryngology

## 2023-11-19 VITALS — BP 154/90 | HR 89 | Ht 68.0 in | Wt 197.0 lb

## 2023-11-19 DIAGNOSIS — J3489 Other specified disorders of nose and nasal sinuses: Secondary | ICD-10-CM

## 2023-11-19 DIAGNOSIS — Z87891 Personal history of nicotine dependence: Secondary | ICD-10-CM

## 2023-11-19 DIAGNOSIS — R0981 Nasal congestion: Secondary | ICD-10-CM

## 2023-11-19 DIAGNOSIS — R591 Generalized enlarged lymph nodes: Secondary | ICD-10-CM | POA: Insufficient documentation

## 2023-11-19 MED ORDER — SALINE SPRAY 0.65 % NA SOLN: 2.0000 | NASAL | 1 refills | Status: AC | PRN

## 2023-11-19 MED ORDER — FLUTICASONE PROPIONATE 50 MCG/ACT NA SUSP: 2.0000 | Freq: Every day | NASAL | 6 refills | Status: AC

## 2023-11-19 NOTE — Progress Notes (Signed)
 Dear Dr. Edman, Here is my assessment for our mutual patient, Katrina [name]. Thank you for allowing me the opportunity to care for your patient. Please do not hesitate to contact me should you have any other questions. Sincerely, Dr. Eldora Blanch  Otolaryngology Clinic Note  HISTORY: Katrina [name] is a 73 y.o. female referred for evaluation of nasal septal perforation.  Initial visit (10/2023): First noticed it a few months ago, when she had some whistling. This has stopped. Intermittent congestion and obstruction. Otherwise noted incidentally, and she denies any history of chronic or frequently recurring sinus infections, trauma to nose, instrumentation, nasal surgery. No rashes, joint pain, autoimmune disease, B symptoms. No epistaxis. No noted neck lymphadenopathy. No intranasal drug use.  Allergy testing has not been done. No previous sinonasal surgery.  She is currently using no nasal meds.  GLP-1: no AP/AC: no  Tobacco: former, quit  PMHx: DLBCL, CKD,Kidney Cancer, HTN  RADIOGRAPHIC EVALUATION AND INDEPENDENT REVIEW OF OTHER RECORDS:: Katrina [name] (10/31/2023): no B symptoms; noted cartilaginous hole in nose about 3 months ago. 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   Paresthesia of lower extremity 11/2016   bilateral   Pulmonary nodules 11/2016   Renal cancer, right (HCC) dx'd 2019   Right renal mass 11/2016   Past  Surgical History:  Procedure Laterality Date   ABDOMINAL HYSTERECTOMY     BREAST CYST EXCISION Left    age 47's   COLONOSCOPY N/A 11/02/2013   Procedure: COLONOSCOPY;  Surgeon: Margo LITTIE Haddock, MD;  [facility];  Service: Endoscopy;  Laterality: N/A;  1:15 - moved to 845 - Kim notified pt   COLONOSCOPY N/A 04/17/2018   Procedure: COLONOSCOPY;  Surgeon: Haddock Margo LITTIE, MD;  [facility];  Service: Endoscopy;  Laterality: N/A;  12:00   COLONOSCOPY WITH PROPOFOL  N/A 05/19/2023   Procedure: COLONOSCOPY WITH PROPOFOL ;  Surgeon: Cindie Carlin POUR, DO;  [facility];  Service: Endoscopy;  Laterality: N/A;  9:00 am, asa 3   IR FLUORO GUIDE PORT INSERTION RIGHT  12/30/2016   IR REMOVAL TUN ACCESS W/ PORT W/O FL MOD SED  07/04/2017   IR US  GUIDE VASC ACCESS RIGHT  12/30/2016   LAMINECTOMY N/A 12/18/2016   Procedure: DECOMPRESSION T5-8/THORACIC;  Surgeon: Onetha Kuba, MD;  Location: [facility] OR;  Service: Neurosurgery;  Laterality: N/A;  Decompressive Thoracic Laminectomy, Thoracic five - six, Thoraic six - seven, Thoracic seven - eight   POLYPECTOMY  04/17/2018   Procedure: POLYPECTOMY;  Surgeon: Haddock Margo LITTIE, MD;  [facility];  Service: Endoscopy;;  splenic flexure   POLYPECTOMY  05/19/2023   Procedure: POLYPECTOMY;  Surgeon: Cindie Carlin POUR, DO;  [facility];  Service: Endoscopy;;   ROBOT ASSISTED LAPAROSCOPIC NEPHRECTOMY Right 06/11/2018   Procedure: XI ROBOTIC ASSISTED LAPAROSCOPIC NEPHRECTOMY;  Surgeon: Sherrilee Belvie LITTIE, MD;  Location: [facility];  Service: Urology;  Laterality: Right;  2.5 HRS   SHOULDER SURGERY     right   Family History  Problem Relation Age of Onset   COPD Mother    Diabetes Mellitus II Mother    CAD Mother        Angina   Colon cancer Father    Colon cancer Sister        at age 5   Thyroid disease Sister    Breast cancer Sister    Diabetes Mellitus II Sister    Cirrhosis Sister        NASH   Colon cancer  Brother        at age 70   Lung cancer Brother    GER disease Son    Social History   Tobacco Use   Smoking status: Former    Current packs/day: 0.00    Average packs/day: 1 pack/day for 15.0 years (15.0 ttl pk-yrs)    Types: Cigarettes    Start date: 87    Quit date: 1998    Years since quitting: 27.7   Smokeless tobacco: Never   Tobacco comments:    1 pack/week  Substance Use Topics   Alcohol use: No   Allergies  Allergen Reactions   Contrast Media [Iodinated Contrast Media] Hives and Itching   Current Outpatient Medications  Medication Sig Dispense Refill   ascorbic acid (VITAMIN C) 500 MG tablet Take 500 mg by mouth daily.     chlorpheniramine (CHLOR-TRIMETON) 4 MG tablet Take 4 mg by mouth daily.     cholecalciferol (VITAMIN D3) 25 MCG (1000 UNIT) tablet Take 1,000 Units by mouth daily.     Cyanocobalamin (VITAMIN B 12 PO) Take by mouth.     diphenhydramine -acetaminophen  (TYLENOL  PM) 25-500 MG TABS tablet Take 1 tablet by mouth at bedtime as needed (takes for assistance sleeping.).     Docusate Calcium  (STOOL SOFTENER PO) Take by mouth.     fluticasone (FLONASE) 50 MCG/ACT nasal spray Place 2 sprays into both nostrils daily. 16 g 6   magnesium  gluconate (MAGONATE) 500 MG tablet Take 500 mg by mouth 2 (two) times daily.     Multiple Vitamin (MULTIVITAMIN) tablet Take 1 tablet by mouth daily.     sodium chloride  (OCEAN) 0.65 % SOLN nasal spray Place 2 sprays into both nostrils as needed for congestion. 30 mL 1   No current facility-administered medications for this visit.   BP (!) 154/90 (BP Location: Right Arm, Patient Position: Sitting, Cuff Size: Large)   Pulse 89   Ht 5' 8 (1.727 m)   Wt 197 lb (89.4 kg)   SpO2 95%   BMI 29.95 kg/m   PHYSICAL EXAM:  BP (!) 154/90 (BP Location: Right Arm, Patient Position: Sitting, Cuff Size: Large)   Pulse 89   Ht 5' 8 (1.727 m)   Wt 197 lb (89.4 kg)   SpO2 95%   BMI 29.95 kg/m    Salient findings:  CN II-XII  intact Bilateral EAC clear and TM intact with well pneumatized middle ear spaces Nose: Anterior rhinoscopy reveals ~1.5 cm caudal nasal septal perforation which appears clean without any obvious surrounding granulation; bilateral IT with modest hypertrophy.  Nasal endoscopy was indicated to better evaluate the nose and paranasal sinuses, given the patient's history and exam findings, and is detailed below. No lesions of oral cavity/oropharynx No obviously palpable neck masses/lymphadenopathy/thyromegaly No respiratory distress or stridor   PROCEDURE:  Prior to initiating any procedures, risks/benefits/alternatives were explained to the patient and verbal consent  obtained. Diagnostic Nasal Endoscopy Pre-procedure diagnosis: Nasal septal perforation, rule out any other pathology in nose Post-procedure diagnosis: same Indication: See pre-procedure diagnosis and physical exam above Complications: None apparent EBL: 0 mL Anesthesia: Lidocaine  4% and topical decongestant was topically sprayed in each nasal cavity  Description of Procedure:  Patient was identified. A rigid 30 degree endoscope was utilized to evaluate the sinonasal cavities, mucosa, sinus ostia and turbinates and septum.  Overall, signs of mucosal inflammation are mild - caudal septal perforation which is clean overall - mild posterior crusting  No mucopurulence, polyps, or masses noted.   Right Middle meatus: clear Right SE Recess: clear Left MM: clear Left SE Recess: clear  Photodocumentation was obtained.  CPT CODE -- 31231 - Mod 25   ASSESSMENT:  73 y.o. with:  1. Nasal septal perforation   2. Nasal obstruction   3. Nasal congestion    Incidentally noted NSP. We discussed wide DDX, but otherwise denies RFX. Appears clean. We discussed workup including labs and bx and mgmt including humidification. Endo otherwise without other lesions.  PLAN: We've discussed issues and options today.  We reviewed the nasal endoscopy  images together.  The risks, benefits and alternatives were discussed and questions answered.  She has elected to proceed with:  1) Humidification with saline spray BID; flonase BID 2) Will order rheum labs 3) deferred bx currently; at next visit can discuss  See below regarding exact medications prescribed this encounter including dosages and route: Meds ordered this encounter  Medications   fluticasone (FLONASE) 50 MCG/ACT nasal spray    Sig: Place 2 sprays into both nostrils daily.    Dispense:  16 g    Refill:  6   sodium chloride  (OCEAN) 0.65 % SOLN nasal spray    Sig: Place 2 sprays into both nostrils as needed for congestion.    Dispense:  30 mL    Refill:  1     Thank you for allowing me the opportunity to care for your patient. Please do not hesitate to contact me should you have any other questions.  Sincerely, Eldora Blanch, MD Otolaryngologist (ENT), [facility] Phone: [phone] Fax: [phone]  MDM:  Level 4: [phone] Complexity/Problems addressed: mod - chronic problems now Data complexity: high - independent interpretation of CT; review of note, labs, ordering lab/test - Morbidity: mod - Drug prescribed or managed: y  11/29/2023, 7:16 PM

## 2023-11-20 ENCOUNTER — Telehealth: Payer: Self-pay

## 2023-11-20 NOTE — Telephone Encounter (Signed)
-----   Message from Katrina [name] sent at 11/20/2023  9:11 AM EDT ----- Pls call her Official reading on CT neck is pending but based on my review I did not see anything suspicious

## 2023-11-20 NOTE — Telephone Encounter (Signed)
 Called and given below message. She verbalized understanding.

## 2023-11-22 ENCOUNTER — Other Ambulatory Visit: Payer: Self-pay | Admitting: Family Medicine

## 2023-11-24 LAB — ANGIOTENSIN CONVERTING ENZYME: Angio Convert Enzyme: 33 U/L (ref 14–82)

## 2023-11-24 LAB — ANCA PROFILE
Anti-MPO Antibodies: <0.2 U (ref 0.0–0.9)
Anti-PR3 Antibodies: <0.2 U (ref 0.0–0.9)
Atypical pANCA: <1:20 {titer}
C-ANCA: <1:20 {titer}
P-ANCA: <1:20 {titer}

## 2023-11-24 LAB — RHEUMATOID FACTOR: Rheumatoid fact SerPl-aCnc: 18.9 [IU]/mL — ABNORMAL HIGH (ref <14.0)

## 2023-11-24 LAB — ANA: Anti Nuclear Antibody (ANA): POSITIVE — AB

## 2023-11-27 ENCOUNTER — Telehealth: Payer: Self-pay

## 2023-11-27 NOTE — Telephone Encounter (Signed)
 Called and given below message. She verbalized understanding.

## 2023-11-27 NOTE — Telephone Encounter (Signed)
-----   Message from Almarie Bedford sent at 11/27/2023  8:30 AM EDT ----- Erskin America,  Please call her I saw her because she had concern for lymphoma Her exam was benign Her PCP ordered CT, looks normal too Nothing needs to be done from my stand point

## 2023-11-29 ENCOUNTER — Ambulatory Visit (INDEPENDENT_AMBULATORY_CARE_PROVIDER_SITE_OTHER): Payer: Self-pay | Admitting: Otolaryngology

## 2024-01-19 ENCOUNTER — Ambulatory Visit (INDEPENDENT_AMBULATORY_CARE_PROVIDER_SITE_OTHER): Admitting: Otolaryngology

## 2024-01-21 ENCOUNTER — Encounter (INDEPENDENT_AMBULATORY_CARE_PROVIDER_SITE_OTHER): Payer: Self-pay | Admitting: Otolaryngology

## 2024-01-21 ENCOUNTER — Ambulatory Visit (INDEPENDENT_AMBULATORY_CARE_PROVIDER_SITE_OTHER): Admitting: Otolaryngology

## 2024-01-21 VITALS — BP 168/99 | HR 74 | Ht 68.0 in | Wt 197.0 lb

## 2024-01-21 DIAGNOSIS — J3489 Other specified disorders of nose and nasal sinuses: Secondary | ICD-10-CM | POA: Diagnosis not present

## 2024-01-21 DIAGNOSIS — R7689 Other specified abnormal immunological findings in serum: Secondary | ICD-10-CM | POA: Diagnosis not present

## 2024-01-21 DIAGNOSIS — R0981 Nasal congestion: Secondary | ICD-10-CM | POA: Diagnosis not present

## 2024-01-21 NOTE — Progress Notes (Signed)
 Dear Dr. Terry Wilhelmena Falter, Here is my assessment for our mutual patient, Katrina [name]. Thank you for allowing me the opportunity to care for your patient. Please do not hesitate to contact me should you have any other questions. Sincerely, Dr. Eldora Blanch  Otolaryngology Clinic Note  HISTORY: Katrina [name] is a 73 y.o. female referred for evaluation of nasal septal perforation.  Initial visit (10/2023): First noticed it a few months ago, when she had some whistling. This has stopped. Intermittent congestion and obstruction. Otherwise noted incidentally, and she denies any history of chronic or frequently recurring sinus infections, trauma to nose, instrumentation, nasal surgery. No rashes, joint pain, autoimmune disease, B symptoms. No epistaxis. No noted neck lymphadenopathy. No intranasal drug use.  Allergy testing has not been done. No previous sinonasal surgery.  She is currently using no nasal meds.  --------------------------------------------------------- 01/21/2024 Nasal itches, no whistling issues currently. Able to breathe and smell. Tried saline spray and flonase  and it helped some. No rashes, mostly knee pain and left shoulder, intermittent finger discomfort. We did discuss her labs. No bleeding from nose.   GLP-1: no AP/AC: no  Tobacco: former, quit  PMHx: DLBCL, CKD,Kidney Cancer, HTN  RADIOGRAPHIC EVALUATION AND INDEPENDENT REVIEW OF OTHER RECORDS:: Gloria Zarwolo (10/31/2023): no B symptoms; noted cartilaginous hole in nose about 3 months ago. 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   Paresthesia of lower extremity 11/2016   bilateral   Pulmonary nodules 11/2016   Renal cancer, right (HCC) dx'd 2019   Right renal mass 11/2016   Past Surgical History:  Procedure Laterality Date   ABDOMINAL HYSTERECTOMY     BREAST CYST EXCISION Left    age 71's   COLONOSCOPY N/A 11/02/2013   Procedure: COLONOSCOPY;  Surgeon: Margo LITTIE Haddock, MD;  Location: [facility];  Service: Endoscopy;  Laterality: N/A;  1:15 - moved to 845 - Kim notified pt   COLONOSCOPY N/A 04/17/2018   Procedure: COLONOSCOPY;  Surgeon: Haddock Margo LITTIE, MD;  Location: [facility];  Service: Endoscopy;  Laterality: N/A;  12:00   COLONOSCOPY WITH PROPOFOL  N/A 05/19/2023   Procedure: COLONOSCOPY WITH PROPOFOL ;  Surgeon: Cindie Carlin POUR, DO;  Location: [facility];  Service: Endoscopy;  Laterality: N/A;  9:00 am, asa 3   IR FLUORO GUIDE PORT INSERTION RIGHT  12/30/2016   IR REMOVAL TUN ACCESS W/ PORT W/O FL MOD SED  07/04/2017   IR US  GUIDE VASC ACCESS RIGHT  12/30/2016   LAMINECTOMY N/A 12/18/2016   Procedure: DECOMPRESSION T5-8/THORACIC;  Surgeon: Onetha Kuba, MD;  Location: [facility];  Service: Neurosurgery;  Laterality: N/A;  Decompressive Thoracic Laminectomy, Thoracic five - six, Thoraic six - seven, Thoracic seven - eight   POLYPECTOMY  04/17/2018   Procedure: POLYPECTOMY;  Surgeon: Haddock Margo LITTIE, MD;  Location: [facility];  Service: Endoscopy;;  splenic flexure   POLYPECTOMY  05/19/2023   Procedure: POLYPECTOMY;  Surgeon: Cindie Carlin POUR, DO;  Location: [facility];  Service: Endoscopy;;   ROBOT ASSISTED LAPAROSCOPIC NEPHRECTOMY Right 06/11/2018   Procedure: XI ROBOTIC ASSISTED LAPAROSCOPIC NEPHRECTOMY;  Surgeon: Sherrilee Belvie CROME, MD;  Location: [facility];  Service: Urology;  Laterality: Right;  2.5 HRS    SHOULDER SURGERY     right   Family History  Problem Relation Age of Onset   COPD Mother    Diabetes Mellitus II Mother    CAD Mother        Angina   Colon cancer Father    Colon cancer Sister        at age 31   Thyroid disease Sister    Breast cancer Sister    Diabetes Mellitus II Sister    Cirrhosis Sister        NASH   Colon cancer Brother        at age 19   Lung cancer Brother    GER disease Son    Social History   Tobacco Use   Smoking status: Former    Current packs/day: 0.00    Average packs/day: 1 pack/day for 15.0 years (15.0 ttl pk-yrs)    Types: Cigarettes    Start date: 74    Quit date: 1998    Years since quitting: 27.9   Smokeless tobacco: Never   Tobacco comments:    1 pack/week  Substance Use Topics   Alcohol use: No   Allergies  Allergen Reactions   Contrast Media [Iodinated Contrast Media] Hives and Itching   Current Outpatient Medications  Medication Sig Dispense Refill   ascorbic acid (VITAMIN C) 500 MG tablet Take 500 mg by mouth daily.     chlorpheniramine (CHLOR-TRIMETON) 4 MG tablet Take 4 mg by mouth daily.     cholecalciferol (VITAMIN D3) 25 MCG (1000 UNIT) tablet Take 1,000 Units by mouth daily.     Cyanocobalamin (VITAMIN B 12 PO) Take by mouth.     diphenhydramine -acetaminophen  (TYLENOL  PM) 25-500 MG TABS tablet Take 1 tablet by mouth at bedtime as needed (takes for assistance sleeping.).     Docusate Calcium  (STOOL SOFTENER PO) Take by mouth.     fluticasone  (FLONASE ) 50 MCG/ACT nasal spray Place 2 sprays into both nostrils daily. 16 g 6   magnesium  gluconate (MAGONATE) 500 MG tablet Take 500 mg by mouth 2 (two) times daily.     Multiple Vitamin (MULTIVITAMIN) tablet Take 1 tablet by mouth daily.     sodium chloride  (OCEAN) 0.65 % SOLN nasal spray Place 2 sprays into both nostrils as needed for congestion. 30 mL 1   No current facility-administered medications for this visit.   BP (!) 168/99 (BP Location: Left Arm, Patient  Position: Sitting, Cuff Size: Large)   Pulse 74   Ht 5' 8 (1.727 m)   Wt 197 lb (89.4 kg)   SpO2 95%   BMI 29.95 kg/m   PHYSICAL EXAM:  BP (!) 168/99 (BP Location: Left Arm, Patient Position: Sitting, Cuff Size: Large)   Pulse 74   Ht 5' 8 (1.727 m)   Wt 197 lb (89.4 kg)   SpO2 95%   BMI 29.95 kg/m    Salient findings:  CN II-XII intact Bilateral EAC clear and TM intact with well pneumatized middle ear spaces Nose: Anterior rhinoscopy reveals ~1.5 cm caudal nasal septal perforation which appears clean without any obvious surrounding granulation; bilateral IT with modest hypertrophy.  Stable No lesions of  oral cavity/oropharynx No obviously palpable neck masses/lymphadenopathy/thyromegaly No respiratory distress or stridor   PROCEDURE (Prior, not today):  Prior to initiating any procedures, risks/benefits/alternatives were explained to the patient and verbal consent obtained. Diagnostic Nasal Endoscopy Pre-procedure diagnosis: Nasal septal perforation, rule out any other pathology in nose Post-procedure diagnosis: same Indication: See pre-procedure diagnosis and physical exam above Complications: None apparent EBL: 0 mL Anesthesia: Lidocaine  4% and topical decongestant was topically sprayed in each nasal cavity  Description of Procedure:  Patient was identified. A rigid 30 degree endoscope was utilized to evaluate the sinonasal cavities, mucosa, sinus ostia and turbinates and septum.  Overall, signs of mucosal inflammation are mild - caudal septal perforation which is clean overall - mild posterior crusting  No mucopurulence, polyps, or masses noted.   Right Middle meatus: clear Right SE Recess: clear Left MM: clear Left SE Recess: clear  Photodocumentation was obtained.  CPT CODE -- 31231 - Mod 25   ASSESSMENT:  73 y.o. with DLBCL  1. Nasal septal perforation   2. Nasal obstruction   3. Nasal congestion   4. ANA positive    Incidentally noted NSP, appears to be  present since at least 2022 on prior MRI. We discussed wide DDX, but otherwise denies RFX. Appears clean, which is stale. We discussed workup bx and mgmt including humidification. Endo otherwise without other lesions. She does not wish for biopsy and would like to observe, which seems reasonable.   PLAN: We've discussed issues and options today.  We reviewed the nasal endoscopy images together.  The risks, benefits and alternatives were discussed and questions answered.  She has elected to proceed with:  1) Continued humidification with saline spray BID 2) Refer to rheumatology 3) deferred bx currently; d/w pt f/u, opted PRN given chronicity  See below regarding exact medications prescribed this encounter including dosages and route: No orders of the defined types were placed in this encounter.    Thank you for allowing me the opportunity to care for your patient. Please do not hesitate to contact me should you have any other questions.  Sincerely, Eldora Blanch, MD Otolaryngologist (ENT), [facility] Phone: [phone] Fax: [phone]  MDM:  I have personally spent 30 minutes involved in face-to-face and non-face-to-face activities for this patient on the day of the visit.  Professional time spent excludes any procedures performed but includes the following activities, in addition to those noted in the documentation: preparing to see the patient (review of outside documentation and results), performing a medically appropriate examination, counseling, documenting in the electronic health record   01/21/2024, 9:57 AM

## 2024-05-03 ENCOUNTER — Ambulatory Visit

## 2024-05-07 ENCOUNTER — Ambulatory Visit: Admitting: Rheumatology

## 2024-06-18 ENCOUNTER — Ambulatory Visit: Admitting: Rheumatology
